# Patient Record
Sex: Female | Born: 1955 | Race: Black or African American | Hispanic: No | Marital: Single | Smoking: Never smoker
Health system: Southern US, Community
[De-identification: ages and names within clinical notes are randomized; demographics above are authoritative.]

## PROBLEM LIST (undated history)

## (undated) DIAGNOSIS — G8929 Other chronic pain: Secondary | ICD-10-CM

## (undated) DIAGNOSIS — M25569 Pain in unspecified knee: Secondary | ICD-10-CM

## (undated) DIAGNOSIS — M549 Dorsalgia, unspecified: Secondary | ICD-10-CM

## (undated) DIAGNOSIS — F209 Schizophrenia, unspecified: Secondary | ICD-10-CM

## (undated) DIAGNOSIS — K5792 Diverticulitis of intestine, part unspecified, without perforation or abscess without bleeding: Secondary | ICD-10-CM

## (undated) DIAGNOSIS — R109 Unspecified abdominal pain: Secondary | ICD-10-CM

## (undated) HISTORY — PX: KNEE SURGERY: SHX244

## (undated) HISTORY — PX: ABDOMINAL HYSTERECTOMY: SHX81

## (undated) HISTORY — DX: Unspecified abdominal pain: R10.9

## (undated) HISTORY — DX: Diverticulitis of intestine, part unspecified, without perforation or abscess without bleeding: K57.92

## (undated) HISTORY — PX: WRIST SURGERY: SHX841

## (undated) HISTORY — PX: CHOLECYSTECTOMY: SHX55

---

## 2015-09-22 ENCOUNTER — Encounter (HOSPITAL_COMMUNITY): Payer: Self-pay | Admitting: *Deleted

## 2015-09-22 ENCOUNTER — Emergency Department (HOSPITAL_COMMUNITY)
Admission: EM | Admit: 2015-09-22 | Discharge: 2015-09-23 | Disposition: A | Discharge status: Home / Self Care | Payer: Medicaid Other | Attending: Emergency Medicine | Admitting: Emergency Medicine

## 2015-09-22 DIAGNOSIS — R440 Auditory hallucinations: Secondary | ICD-10-CM | POA: Diagnosis present

## 2015-09-22 DIAGNOSIS — F209 Schizophrenia, unspecified: Secondary | ICD-10-CM | POA: Insufficient documentation

## 2015-09-22 LAB — CBC WITH DIFFERENTIAL/PLATELET
BASOS ABS: 0 10*3/uL (ref 0.0–0.1)
BASOS PCT: 0 %
Eosinophils Absolute: 0.2 10*3/uL (ref 0.0–0.7)
Eosinophils Relative: 2 %
HEMATOCRIT: 39.2 % (ref 36.0–46.0)
HEMOGLOBIN: 12.5 g/dL (ref 12.0–15.0)
LYMPHS PCT: 24 %
Lymphs Abs: 2.5 10*3/uL (ref 0.7–4.0)
MCH: 26.3 pg (ref 26.0–34.0)
MCHC: 31.9 g/dL (ref 30.0–36.0)
MCV: 82.4 fL (ref 78.0–100.0)
MONO ABS: 0.8 10*3/uL (ref 0.1–1.0)
MONOS PCT: 8 %
NEUTROS ABS: 6.9 10*3/uL (ref 1.7–7.7)
NEUTROS PCT: 66 %
Platelets: 303 10*3/uL (ref 150–400)
RBC: 4.76 MIL/uL (ref 3.87–5.11)
RDW: 13.9 % (ref 11.5–15.5)
WBC: 10.5 10*3/uL (ref 4.0–10.5)

## 2015-09-22 LAB — URINALYSIS, ROUTINE W REFLEX MICROSCOPIC
BILIRUBIN URINE: NEGATIVE
GLUCOSE, UA: NEGATIVE mg/dL
HGB URINE DIPSTICK: NEGATIVE
KETONES UR: NEGATIVE mg/dL
Nitrite: NEGATIVE
PH: 5.5 (ref 5.0–8.0)
PROTEIN: NEGATIVE mg/dL
Specific Gravity, Urine: 1.017 (ref 1.005–1.030)
Urobilinogen, UA: 0.2 mg/dL (ref 0.0–1.0)

## 2015-09-22 LAB — RAPID URINE DRUG SCREEN, HOSP PERFORMED
Amphetamines: NOT DETECTED
BARBITURATES: NOT DETECTED
BENZODIAZEPINES: NOT DETECTED
COCAINE: NOT DETECTED
Opiates: NOT DETECTED
TETRAHYDROCANNABINOL: NOT DETECTED

## 2015-09-22 LAB — URINE MICROSCOPIC-ADD ON

## 2015-09-22 LAB — COMPREHENSIVE METABOLIC PANEL
ALBUMIN: 3.7 g/dL (ref 3.5–5.0)
ALT: 14 U/L (ref 14–54)
ANION GAP: 9 (ref 5–15)
AST: 21 U/L (ref 15–41)
Alkaline Phosphatase: 80 U/L (ref 38–126)
BILIRUBIN TOTAL: 0.8 mg/dL (ref 0.3–1.2)
BUN: 6 mg/dL (ref 6–20)
CHLORIDE: 100 mmol/L — AB (ref 101–111)
CO2: 28 mmol/L (ref 22–32)
Calcium: 9.2 mg/dL (ref 8.9–10.3)
Creatinine, Ser: 0.83 mg/dL (ref 0.44–1.00)
GFR calc Af Amer: >60 mL/min (ref >60)
GFR calc non Af Amer: >60 mL/min (ref >60)
GLUCOSE: 92 mg/dL (ref 65–99)
Potassium: 3.3 mmol/L — ABNORMAL LOW (ref 3.5–5.1)
SODIUM: 137 mmol/L (ref 135–145)
TOTAL PROTEIN: 7 g/dL (ref 6.5–8.1)

## 2015-09-22 LAB — ETHANOL: Alcohol, Ethyl (B): <5 mg/dL (ref <5)

## 2015-09-22 MED ORDER — ZOLPIDEM TARTRATE 5 MG PO TABS: 5.0000 mg | ORAL_TABLET | Freq: Every evening | ORAL | Status: DC | PRN

## 2015-09-22 MED ORDER — IBUPROFEN 400 MG PO TABS: 600.0000 mg | ORAL_TABLET | Freq: Three times a day (TID) | ORAL | Status: DC | PRN

## 2015-09-22 MED ORDER — NICOTINE 21 MG/24HR TD PT24: 21.0000 mg | MEDICATED_PATCH | Freq: Every day | TRANSDERMAL | Status: DC

## 2015-09-22 MED ORDER — ACETAMINOPHEN 325 MG PO TABS: 650.0000 mg | ORAL_TABLET | ORAL | Status: DC | PRN

## 2015-09-22 MED ORDER — TRIHEXYPHENIDYL HCL 5 MG PO TABS
5.0000 mg | ORAL_TABLET | Freq: Two times a day (BID) | ORAL | Status: DC
  Administered 2015-09-23: 5 mg via ORAL
  Filled 2015-09-22 (×3): qty 1

## 2015-09-22 MED ORDER — TRIHEXYPHENIDYL HCL 5 MG PO TABS: 5.0000 mg | ORAL_TABLET | Freq: Two times a day (BID) | ORAL | Status: DC

## 2015-09-22 MED ORDER — ACETAMINOPHEN 325 MG PO TABS: 325.0000 mg | ORAL_TABLET | Freq: Once | ORAL | Status: DC

## 2015-09-22 MED ORDER — TRIHEXYPHENIDYL HCL 5 MG PO TABS
5.0000 mg | ORAL_TABLET | Freq: Once | ORAL | Status: AC
  Administered 2015-09-22: 5 mg via ORAL
  Filled 2015-09-22 (×2): qty 1

## 2015-09-22 MED ORDER — ONDANSETRON HCL 4 MG PO TABS: 4.0000 mg | ORAL_TABLET | Freq: Three times a day (TID) | ORAL | Status: DC | PRN

## 2015-09-22 MED ORDER — POTASSIUM CHLORIDE CRYS ER 20 MEQ PO TBCR
40.0000 meq | EXTENDED_RELEASE_TABLET | Freq: Once | ORAL | Status: AC
  Administered 2015-09-22: 40 meq via ORAL
  Filled 2015-09-22: qty 2

## 2015-09-22 MED ORDER — ALUM & MAG HYDROXIDE-SIMETH 200-200-20 MG/5ML PO SUSP: 30.0000 mL | ORAL | Status: DC | PRN

## 2015-09-22 NOTE — ED Notes (Signed)
PT is here with bilateral ankle and knee pain from walking a lot.  PT sates something is crawling around in her back

## 2015-09-22 NOTE — ED Notes (Signed)
Patient ambulated to rest room. Gait slow, steady.

## 2015-09-22 NOTE — ED Notes (Signed)
PT's SON is DEMETRIUS Heesch. [phone]

## 2015-09-22 NOTE — ED Provider Notes (Signed)
CSN: [id number]     Arrival date & time 09/22/15  1341 History  By signing my name below, I, Ronney LionSuzanne Le, attest that this documentation has been prepared under the direction and in the presence of [employer]ole Maryori Weide, PA-C. Electronically Signed: Ronney LionSuzanne Le, ED Scribe. 09/22/2015. 3:46 PM.    Chief Complaint  Patient presents with  . Hallucinations   The history is provided by the patient and a relative. No language interpreter was used.    HPI Comments: Felicia Collier is a 59 y.o. female who presents to the Emergency Department with the belief that "a snake is crawling around in [her] back." Patient further explains that there is a snake crawling around because she is "a mummy." She is also hearing voices telling her that "they have [her] children and don't want to give them back." However, patient also states she lives at home with her son. She states she is prescribed trazodone, but she has not been taking it because it makes her feel "drunk." She states she last had medication in the form of an injection at the hospital 2 weeks ago. Patient states she [facility]. She denies drug or EtOH consumption. She notes bilateral knee pain and is wearing knee sleeves.  Per telephone conversation with patient's son, patient has been hospitalized [facility] for psychiatric concerns over the past several weeks. He states he had picked her up and brought her to the ED today for knee pain. He states she lives alone.   History reviewed. No pertinent past medical history.   Past Surgical History  Procedure Laterality Date  . Cholecystectomy     No family history on file. Social History  Substance Use Topics  . Smoking status: Never Smoker   . Smokeless tobacco: None  . Alcohol Use: No   OB History    No data available     Review of Systems A complete 10 system review of systems was obtained and all systems are negative except as noted in the HPI  and PMH.    Allergies  Review of patient's allergies indicates not on file.  Home Medications   Prior to Admission medications   Not on File   BP 130/68 mmHg  Pulse 54  Temp(Src) 98.3 F (36.8 C) (Oral)  Resp 18  SpO2 98% Physical Exam  Constitutional: She is oriented to person, place, and time. She appears well-developed and well-nourished. No distress.  HENT:  Head: Normocephalic and atraumatic.  Eyes: Conjunctivae and EOM are normal.  Neck: Neck supple. No tracheal deviation present.  Cardiovascular: Normal rate.   Pulmonary/Chest: Effort normal. No respiratory distress.  Musculoskeletal: Normal range of motion.  Neurological: She is alert and oriented to person, place, and time.  Skin: Skin is warm and dry.  Psychiatric: She has a normal mood and affect. Her speech is tangential and slurred. She is actively hallucinating. She is not agitated and not aggressive. She expresses no homicidal and no suicidal ideation.  Patient appears clean and well cared for  Nursing note and vitals reviewed.   ED Course  Procedures (including critical care time)  DIAGNOSTIC STUDIES: Oxygen Saturation is 98% on RA, normal by my interpretation.    COORDINATION OF CARE: 2:28 PM - Discussed treatment plan with pt at bedside which includes pain medication and blood tests. Pt verbalized understanding and agreed to plan.   Labs Review Labs Reviewed - No data to display  Imaging Review No results found. I  have personally reviewed and evaluated these images and lab results as part of my medical decision-making.   EKG Interpretation None      MDM   Final diagnoses:  Schizophrenia, unspecified type (HCC)    Filed Vitals:   09/22/15 1400  BP: 130/68  Pulse: 54  Temp: 98.3 F (36.8 C)  TempSrc: Oral  Resp: 18  SpO2: 98%    Medications  acetaminophen (TYLENOL) tablet 325 mg (325 mg Oral Not Given 09/22/15 1817)  alum & mag hydroxide-simeth (MAALOX/MYLANTA) 200-200-20 MG/5ML  suspension 30 mL (not administered)  ondansetron (ZOFRAN) tablet 4 mg (not administered)  nicotine (NICODERM CQ - dosed in mg/24 hours) patch 21 mg (not administered)  ibuprofen (ADVIL,MOTRIN) tablet 600 mg (not administered)  acetaminophen (TYLENOL) tablet 650 mg (not administered)  zolpidem (AMBIEN) tablet 5 mg (not administered)  trihexyphenidyl (ARTANE) tablet 5 mg (not administered)    Followed by  trihexyphenidyl (ARTANE) tablet 5 mg (not administered)  potassium chloride SA (K-DUR,KLOR-CON) CR tablet 40 mEq (40 mEq Oral Given 09/22/15 1759)    Felicia Collier is 59 y.o. female presenting with hallucinations, patient is insistent that there is a snake in her back, that she is a mummy, that her children are being held Pharmacist, community. Patient is overtly psychotic with no past medical records in our system. She states [facility], states that she had a shot here week ago however there is no records of this. Patient appears well cared for and well kempt, however floridly disorganized thoughts.  I've called her son Rayfield Citizen states that he just began living with her several days ago. States that he went to pick her up from her apartment in [facility] because he had concerns that she was not caring for herself. As per son he states she has a history of bipolar schizophrenia and she was being managed by the [employer] team. States that he thinks she had multiple admissions to [facility] for psychiatric issues. He is unsure about her medical or psychiatric medications. He is unsure if patient has been taking any medications while living with him.  Discussed case with attending physician and considering our lack of history on this patient and her given age I have considered doing some neuro imaging but we have decided to hold off on that because she has a history of psychiatric issues. Will request medical records from [facility].   Very mild  hypokalemia, will replete orally. Patient is medically cleared for psychiatric evaluation will be transferred to the psych ED. TTS consulted, home meds and psych standard holding orders placed.   Medical records from [facility] show the patient has had multiple psychiatric admissions and has been involuntarily committed, she is noncompliant and it seems that her symptoms are consistent with prior psychotic episodes.  I personally performed the services described in this documentation, which was scribed in my presence. The recorded information has been reviewed and is accurate.     Wynetta Emery, PA-C 09/22/15 1822  Benjiman Core, MD 09/25/15 2249

## 2015-09-22 NOTE — Progress Notes (Signed)
Patient was referred for inpatient psych treatment at: [facility] - per intake, fax referral. [facility] - per French Anaracy, fax referral for the waitlist. [facility] - left voicemail [facility] - per Diannia RuderKara, fax referral, geriatric beds open. [facility] - per Delorise ShinerGrace, fax referral with EKG and chest xray. Results to be ordered.  [facility]  CSW will continue to seek placement.  Melbourne Abtsatia Maimouna Rondeau, LCSWA Disposition staff 09/22/2015 10:01 PM

## 2015-09-22 NOTE — ED Notes (Signed)
Patient awake in room, eating supper.

## 2015-09-22 NOTE — ED Notes (Signed)
During assessment Pt reports she has a snake crawling around on her back. Pt is cooperative and follows directions. Pt placed in wine scrubs .

## 2015-09-22 NOTE — BH Assessment (Signed)
Per Vernona RiegerLaura, NP - patient meets criteria for inpatient hospitalization.  CSW will [facility].

## 2015-09-22 NOTE — ED Notes (Signed)
TTS In progress.  

## 2015-09-22 NOTE — BH Assessment (Addendum)
Tele Assessment Note   Felicia Collier is a 59 y.o. African American female that reports at "a snake is crawling around in [her] back." Patient further explains that there is a snake crawling around because she is "a mummy." She is also hearing voices telling her that "they have [her] children and don't want to give them back." However, patient also states she lives at home with her son. She states she is prescribed trazodone, but she has not been taking it because it makes her feel "drunk." She states she last had medication in the form of an injection at the hospital 2 weeks ago.  Patient is a poor historian.   Writer received collateral information from her son.  Per her son the patient is diagnosed with Schizophrenia.  Patient has been living with him for a week because she is not able to live independently anymore.  Per her son the patient has been hospitalized several times.  Patient reports that she would always get hospitalized when he was a child growing up.  Patient receives psychiatric care at [facility].   Her son reports that he initially brought his mother to the ED due to bilateral knee pain and is wearing knee sleeves.  Her son reports that her last hospitalization was at [facility].    Diagnosis: Schizophrenia  Past Medical History: History reviewed. No pertinent past medical history.  Past Surgical History  Procedure Laterality Date  . Cholecystectomy      Family History: No family history on file.  Social History:  reports that she has never smoked. She does not have any smokeless tobacco history on file. She reports that she does not drink alcohol or use illicit drugs.  Additional Social History:  Alcohol / Drug Use History of alcohol / drug use?: No history of alcohol / drug abuse  CIWA: CIWA-Ar BP: 130/68 mmHg Pulse Rate: (!) 54 COWS:    PATIENT STRENGTHS: (choose at least two) Average or above average intelligence Supportive family/friends  Allergies:  No Known Allergies  Home Medications:  (Not in a hospital admission)  OB/GYN Status:  No LMP recorded.  General Assessment Data Location of Assessment: [facility] TTS Assessment: In system Is this a Tele or Face-to-Face Assessment?: Tele Assessment Is this an Initial Assessment or a Re-assessment for this encounter?: Initial Assessment Marital status: Single Maiden name: NA Is patient pregnant?: No Pregnancy Status: No Living Arrangements: Other (Comment) (Lives with her son for one week. ) Can pt return to current living arrangement?: Yes Admission Status: Voluntary Is patient capable of signing voluntary admission?: Yes Referral Source: Self/Family/Friend Insurance type: Medicaid  Medical Screening Exam [facility] Walk-in ONLY) Medical Exam completed: Yes  Crisis Care Plan Living Arrangements: Other (Comment) (Lives with her son for one week. ) Name of Psychiatrist: Patient was not able to answer the question.  Name of Therapist: Patienr was not abled to answer the question   Education Status Is patient currently in school?: No Current Grade: NA Highest grade of school patient has completed: NA Name of school: NA Contact person: NA  Risk to self with the past 6 months Suicidal Ideation: No Has patient been a risk to self within the past 6 months prior to admission? : No Suicidal Intent: No Has patient had any suicidal intent within the past 6 months prior to admission? : No Is patient at risk for suicide?: No Suicidal Plan?: No Has patient had any suicidal plan within the past 6 months prior to admission? :  No Access to Means: No What has been your use of drugs/alcohol within the last 12 months?: NA Previous Attempts/Gestures: No How many times?: 0 Other Self Harm Risks: NA Triggers for Past Attempts: None known Intentional Self Injurious Behavior: None Family Suicide History: No Recent stressful life event(s): Other (Comment) (Was living independently now livnig with her  son ) Persecutory voices/beliefs?: Yes Depression: Yes Depression Symptoms: Despondent, Guilt, Feeling worthless/self pity Substance abuse history and/or treatment for substance abuse?: No Suicide prevention information given to non-admitted patients: Not applicable  Risk to Others within the past 6 months Homicidal Ideation: No Does patient have any lifetime risk of violence toward others beyond the six months prior to admission? : No Thoughts of Harm to Others: No Current Homicidal Intent: No Current Homicidal Plan: No Access to Homicidal Means: No Identified Victim: NA History of harm to others?: No Assessment of Violence: None Noted Violent Behavior Description: na Does patient have access to weapons?: No Criminal Charges Pending?: No Does patient have a court date: No Is patient on probation?: No  Psychosis Hallucinations: None noted Delusions: None noted  Mental Status Report Appearance/Hygiene: Disheveled Eye Contact: Poor Motor Activity: Freedom of movement Speech: Tangential Level of Consciousness: Alert Mood: Suspicious Affect: Blunted, Depressed Anxiety Level: None Thought Processes: Flight of Ideas Judgement: Unable to Assess Orientation: Not oriented Obsessive Compulsive Thoughts/Behaviors: None  Cognitive Functioning Concentration: Decreased Memory: Unable to Assess IQ: Average Insight: Unable to Assess Impulse Control: Unable to Assess Appetite: Fair Weight Loss: 0 Weight Gain: 0 Sleep: Unable to Assess Total Hours of Sleep:  (UTA) Vegetative Symptoms: Unable to Assess  ADLScreening [facility] Assessment Services) Patient's cognitive ability adequate to safely complete daily activities?: Yes Patient able to express need for assistance with ADLs?: No Independently performs ADLs?: Yes (appropriate for developmental age)  Prior Inpatient Therapy Prior Inpatient Therapy: Yes Prior Therapy Dates: 2016 [facility] Reason for Treatment: Psychosis  Prior Outpatient Therapy Prior Outpatient Therapy: Yes Prior Therapy Dates: Ongoing  Prior Therapy Facilty/Provider(s): Son does not know the name  Reason for Treatment: Medication Managemetn  Does patient have an ACCT team?: No Does patient have Intensive In-House Services?  : No Does patient have [facility] services? : No Does patient have P4CC services?: No  ADL Screening (condition at time of admission) Patient's cognitive ability adequate to safely complete daily activities?: Yes Is the patient deaf or have difficulty hearing?: No Does the patient have difficulty seeing, even when wearing glasses/contacts?: No Does the patient have difficulty concentrating, remembering, or making decisions?: Yes Patient able to express need for assistance with ADLs?: No Does the patient have difficulty dressing or bathing?: Yes Independently performs ADLs?: Yes (appropriate for developmental age) Does the patient have difficulty walking or climbing stairs?: No Weakness of Legs: None Weakness of Arms/Hands: None  Home Assistive Devices/Equipment Home Assistive Devices/Equipment: None    Abuse/Neglect Assessment (Assessment to be complete while patient is alone) Physical Abuse: Denies Verbal Abuse: Denies Sexual Abuse: Denies Exploitation of patient/patient's resources: Denies Self-Neglect: Denies Values / Beliefs Cultural Requests During Hospitalization: None Spiritual Requests During Hospitalization: None Consults Spiritual Care Consult Needed: No Social Work Consult Needed: No Merchant navy officerAdvance Directives (For Healthcare) Does patient have an advance directive?: No Would patient like information on creating an advanced directive?: No - patient declined information Type of Advance Directive: Living will Copy of advanced directive(s) in chart?: No - copy requested    Additional Information 1:1 In Past 12 Months?: No CIRT Risk:  No Elopement Risk:  No Does patient have medical clearance?: Yes     Disposition: Per Vernona Rieger, NP - patient meets criteria for inpatient hospitalization.  CSW will seek placement.  Disposition Initial Assessment Completed for this Encounter: Yes Disposition of Patient: Other dispositions  Linton Rump 09/22/2015 5:47 PM

## 2015-09-22 NOTE — ED Notes (Signed)
PT placed in wine scrubs and PT has been wanded

## 2015-09-23 ENCOUNTER — Emergency Department (HOSPITAL_COMMUNITY): Payer: Medicaid Other

## 2015-09-23 NOTE — ED Notes (Signed)
This RN spoke with representative of [facility], advised pt did receive replacement potassium (40 mEq given yesterday) and that pt is stable with clean chest xray and EKG.  [facility] will present case to MD and let us know.

## 2015-09-23 NOTE — ED Notes (Signed)
[employer] notified services needed.

## 2015-09-23 NOTE — Progress Notes (Signed)
French Anaracy with [facility] called to state referral is being reviewed and she had several medical questions re: pt's activity level in ED and potassium levels, etc. Directed her to call [facility] and follow up with CSW if further assistance is needed.  Advised she will be having admitting MD review referral and will call back with result. Pt not accepted at this time.  Ilean SkillMeghan Kajuan Guyton, MSW, LCSW Clinical Social Work, Disposition  09/23/2015 [phone]

## 2015-09-23 NOTE — Progress Notes (Addendum)
Followed up on inpatient psych placement efforts. Also considered for [facility] admission upon bed availability  [facility]- per Nicholos JohnsKathleen, cannot locate referral faxed last night. Ref-faxed with chest x-ray and EKG results [facility]- per Thayer Ohmhris possibility of female geriatric beds- referral made [facility]- per Devra Doppayla, sent referral [facility]- bed status unknown for today but advised send referral for review if beds open [place]- Per Albin Fellingarla, sent referral  Declined: [facility]  Both due to the facilities being unable to accept adult MCD as coverage source.  Ilean SkillMeghan Eddith Mentor, MSW, LCSW Clinical Social Work, Disposition  09/23/2015 [phone]

## 2015-09-23 NOTE — ED Notes (Signed)
Patent was given a snack and drink. Regular diet order taken for dinner.

## 2015-10-04 ENCOUNTER — Emergency Department (INDEPENDENT_AMBULATORY_CARE_PROVIDER_SITE_OTHER)
Admission: EM | Admit: 2015-10-04 | Discharge: 2015-10-04 | Disposition: A | Discharge status: Home / Self Care | Payer: Medicaid Other | Source: Home / Self Care | Attending: Emergency Medicine | Admitting: Emergency Medicine

## 2015-10-04 ENCOUNTER — Encounter (HOSPITAL_COMMUNITY): Payer: Self-pay | Admitting: Emergency Medicine

## 2015-10-04 DIAGNOSIS — W57XXXA Bitten or stung by nonvenomous insect and other nonvenomous arthropods, initial encounter: Secondary | ICD-10-CM

## 2015-10-04 DIAGNOSIS — S80862A Insect bite (nonvenomous), left lower leg, initial encounter: Secondary | ICD-10-CM | POA: Diagnosis not present

## 2015-10-04 MED ORDER — CEPHALEXIN 500 MG PO CAPS: 500.0000 mg | ORAL_CAPSULE | Freq: Three times a day (TID) | ORAL | Status: DC

## 2015-10-04 NOTE — ED Notes (Signed)
[facility] with a complaint of a bug bite to her left lower leg that occurred about 1 week ago.

## 2015-10-04 NOTE — Discharge Instructions (Signed)
It looks like you are having a local reaction to a bug bite. You can continue the Vaseline and histamine cream to help with the itching. I marked the redness today. If the redness is spreading, please start the antibiotics. Follow-up as needed.

## 2015-10-04 NOTE — ED Provider Notes (Signed)
CSN: [id number]     Arrival date & time 10/04/15  1324 History   First MD Initiated Contact with Patient 10/04/15 1436     Chief Complaint  Patient presents with  . Insect Bite   (Consider location/radiation/quality/duration/timing/severity/associated sxs/prior Treatment) HPI  She is a 59 year old woman here for evaluation of insect bite. She states about one week ago she felt something bite her left medial ankle. She did not see what bit her. She reports some redness and swelling to the site. She also states it itches. She's been using Vaseline cream and an antihistamine cream with good control of the itching. Fevers or chills. She states she thinks it is getting better.  History reviewed. No pertinent past medical history. Past Surgical History  Procedure Laterality Date  . Cholecystectomy     History reviewed. No pertinent family history. Social History  Substance Use Topics  . Smoking status: Never Smoker   . Smokeless tobacco: None  . Alcohol Use: No   OB History    No data available     Review of Systems As in history of present illness Allergies  Review of patient's allergies indicates no known allergies.  Home Medications   Prior to Admission medications   Medication Sig Start Date End Date Taking? Authorizing Provider  cephALEXin (KEFLEX) 500 MG capsule Take 1 capsule (500 mg total) by mouth 3 (three) times daily. 10/04/15   Charm RingsErin J Ranie Chinchilla, MD  trihexyphenidyl (ARTANE) 5 MG tablet Take 5 mg by mouth 2 (two) times daily with a meal.    Historical Provider, MD   Meds Ordered and Administered this Visit  Medications - No data to display  BP 153/68 mmHg  Pulse 71  Temp(Src) 98.1 F (36.7 C) (Oral)  Resp 16  SpO2 97% No data found.   Physical Exam  Constitutional: She is oriented to person, place, and time. She appears well-developed and well-nourished. No distress.  Cardiovascular: Normal rate.   Pulmonary/Chest: Effort normal.  Neurological: She is alert  and oriented to person, place, and time.  Skin:  She has a 3 cm area of erythema and mild swelling on the left medial ankle. There is a central punctum. No fluctuance.    ED Course  Procedures (including critical care time)  Labs Review Labs Reviewed - No data to display  Imaging Review No results found.    MDM   1. Insect bite of leg, left, initial encounter    No clear sign of infection. This is likely a local reaction to whatever bit her. Continue Vaseline and antihistamine cream to control itching. I marked the area of erythema. If this is spreading, she will fill the prescription for Keflex. Her son was present for the visit. Follow-up as needed.    Charm RingsErin J Kush Farabee, MD 10/04/15 [phone]

## 2015-10-20 ENCOUNTER — Emergency Department (HOSPITAL_COMMUNITY)
Admission: EM | Admit: 2015-10-20 | Discharge: 2015-10-20 | Disposition: A | Discharge status: Home / Self Care | Payer: Medicaid Other | Source: Home / Self Care | Attending: Family Medicine | Admitting: Family Medicine

## 2015-10-20 ENCOUNTER — Encounter (HOSPITAL_COMMUNITY): Payer: Self-pay | Admitting: Emergency Medicine

## 2015-10-20 DIAGNOSIS — M6580 Other synovitis and tenosynovitis, unspecified site: Secondary | ICD-10-CM | POA: Diagnosis not present

## 2015-10-20 DIAGNOSIS — M76899 Other specified enthesopathies of unspecified lower limb, excluding foot: Secondary | ICD-10-CM

## 2015-10-20 DIAGNOSIS — M6588 Other synovitis and tenosynovitis, other site: Secondary | ICD-10-CM | POA: Diagnosis not present

## 2015-10-20 DIAGNOSIS — L84 Corns and callosities: Secondary | ICD-10-CM

## 2015-10-20 DIAGNOSIS — M7750 Other enthesopathy of unspecified foot: Secondary | ICD-10-CM

## 2015-10-20 MED ORDER — DICLOFENAC SODIUM 1 % TD GEL: 1.0000 "application " | Freq: Four times a day (QID) | TRANSDERMAL | Status: DC

## 2015-10-20 NOTE — ED Provider Notes (Signed)
CSN: [id number]     Arrival date & time 10/20/15  1311 History   First MD Initiated Contact with Patient 10/20/15 1519     Chief Complaint  Patient presents with  . Knee Pain  . Ankle Pain   (Consider location/radiation/quality/duration/timing/severity/associated sxs/prior Treatment) HPI Comments: 59 year old female appearing older than stated age and is complaining of pain in both feet and knees for greater than 4 months. The pain in her left knee and left foot is greatest. 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 The walking is creating more discomfort. The pain is located primarily to the medial aspect of both knees. The pain in the left foot is localized to the plantar aspect to the distal forefoot at the "ball" of the foot beneath the second toe. No history of trauma. She states that she has moved here recently and does not have a PCP. For usual source of for primary care are urgent cares.  Patient is a 59 y.o. female presenting with knee pain and ankle pain.  Knee Pain Associated symptoms: no fatigue, no fever and no neck pain   Ankle Pain Associated symptoms: no fatigue, no fever and no neck pain     History reviewed. No pertinent past medical history. Past Surgical History  Procedure Laterality Date  . Cholecystectomy     No family history on file. Social History  Substance Use Topics  . Smoking status: Never Smoker   . Smokeless tobacco: None  . Alcohol Use: No   OB History    No data available     Review of Systems  Constitutional: Positive for activity change. Negative for fever and fatigue.  Respiratory: Negative.   Gastrointestinal: Negative.   Musculoskeletal: Positive for arthralgias. Negative for joint swelling and neck pain.  Skin: Negative.  Negative for color change and wound.  Neurological: Negative.     Allergies  Review of patient's allergies indicates no known  allergies.  Home Medications   Prior to Admission medications   Medication Sig Start Date End Date Taking? Authorizing Provider  diclofenac sodium (VOLTAREN) 1 % GEL Apply 1 application topically 4 (four) times daily. 10/20/15   Hayden Rasmussen, NP  trihexyphenidyl (ARTANE) 5 MG tablet Take 5 mg by mouth 2 (two) times daily with a meal.    Historical Provider, MD   Meds Ordered and Administered this Visit  Medications - No data to display  BP 121/85 mmHg  Pulse 67  Temp(Src) 98.2 F (36.8 C) (Oral)  Resp 16  SpO2 99% No data found.   Physical Exam  Constitutional: She is oriented to person, place, and time. She appears well-developed and well-nourished. No distress.  Eyes: EOM are normal.  Neck: Normal range of motion. Neck supple.  Cardiovascular: Normal rate.   Pulmonary/Chest: Effort normal. No respiratory distress.  Musculoskeletal: She exhibits no edema.  There is minor tenderness to the bilateral ankles. Specifically to the dorsomedial aspect. No swelling. No bony tenderness. Full range of motion of the ankles. No external lesions or deformities. Tenderness to the dorsomedial tendons. Bilateral knees without apparent edema. Extension and flexion intact. No edema to the joint spaces.  areas of tenderness to the medial aspect of the bilateral knees. Chest medial to the patella. There are cordlike structures that when palpated reproduce the pain for which she presents.   Neurological: She is alert and oriented to person, place, and  time. She exhibits normal muscle tone.  Skin: Skin is warm and dry.  Psychiatric: She has a normal mood and affect.  Nursing note and vitals reviewed.   ED Course  Procedures (including critical care time)  Labs Review Labs Reviewed - No data to display  Imaging Review No results found.   Visual Acuity Review  Right Eye Distance:   Left Eye Distance:   Bilateral Distance:    Right Eye Near:   Left Eye Near:    Bilateral Near:          MDM   1. Tendinitis of knee   2. Tendinitis of ankle or foot   3. Callus of foot    59 year old female is increased her walking in terms of distance and frequency over the past several months. She has gradually developed pain in the knees, ankles and feet. Likely has some tendinitis, possibly arthritis as well. Recommend ice over the areas of swelling. Diclofenac gel 4 times a day to sore areas follow-up podiatrist and obtained a PCP as soon as possible. Limit the amount of walking or doing as much as possible.    Hayden Rasmussenavid Kacey Vicuna, NP 10/20/15 [phone]

## 2015-10-20 NOTE — ED Notes (Signed)
Left knee and left foot pain.  No known injury.  Reports pain for 3 months.

## 2015-11-08 ENCOUNTER — Emergency Department (HOSPITAL_COMMUNITY)
Admission: EM | Admit: 2015-11-08 | Discharge: 2015-11-08 | Disposition: A | Discharge status: Home / Self Care | Payer: Medicaid Other | Attending: Emergency Medicine | Admitting: Emergency Medicine

## 2015-11-08 ENCOUNTER — Encounter (HOSPITAL_COMMUNITY): Payer: Self-pay | Admitting: *Deleted

## 2015-11-08 DIAGNOSIS — M25562 Pain in left knee: Secondary | ICD-10-CM | POA: Insufficient documentation

## 2015-11-08 DIAGNOSIS — Z79899 Other long term (current) drug therapy: Secondary | ICD-10-CM | POA: Insufficient documentation

## 2015-11-08 DIAGNOSIS — G8929 Other chronic pain: Secondary | ICD-10-CM | POA: Insufficient documentation

## 2015-11-08 MED ORDER — IBUPROFEN 400 MG PO TABS
600.0000 mg | ORAL_TABLET | Freq: Once | ORAL | Status: AC
  Administered 2015-11-08: 600 mg via ORAL
  Filled 2015-11-08: qty 1

## 2015-11-08 MED ORDER — TRAMADOL HCL 50 MG PO TABS: 50.0000 mg | ORAL_TABLET | Freq: Two times a day (BID) | ORAL | Status: DC | PRN

## 2015-11-08 MED ORDER — TRAMADOL HCL 50 MG PO TABS
50.0000 mg | ORAL_TABLET | Freq: Once | ORAL | Status: AC
Start: 2015-11-08 — End: 2015-11-08
  Administered 2015-11-08: 50 mg via ORAL
  Filled 2015-11-08: qty 1

## 2015-11-08 NOTE — ED Notes (Signed)
Pt departed in NAD.  

## 2015-11-08 NOTE — ED Notes (Signed)
The pts lt knee has been painful for 2 days.  No  Known injury.  She was seen here and given med that is not helping

## 2015-11-08 NOTE — Discharge Instructions (Signed)
Knee Pain Ms. Felicia Collier, See a primary care physician within one week for close follow-up. Take ibuprofen as needed for your pain. If your pain becomes severe take tramadol. For any worsening symptoms come back to emergency department immediately. Thank you. Knee pain is a common problem. It can have many causes. The pain often goes away by following your doctor's home care instructions. Treatment for ongoing pain will depend on the cause of your pain. If your knee pain continues, more tests may be needed to diagnose your condition. Tests may include X-rays or other imaging studies of your knee. HOME CARE  Take medicines only as told by your doctor.  Rest your knee and keep it raised (elevated) while you are resting.  Do not do things that cause pain or make your pain worse.  Avoid activities where both feet leave the ground at the same time, such as running, jumping rope, or doing jumping jacks.  Apply ice to the knee area:  Put ice in a plastic bag.  Place a towel between your skin and the bag.  Leave the ice on for 20 minutes, 2-3 times a day.  Ask your doctor if you should wear an elastic knee support.  Sleep with a pillow under your knee.  Lose weight if you are overweight. Being overweight can make your knee hurt more.  Do not use any tobacco products, including cigarettes, chewing tobacco, or electronic cigarettes. If you need help quitting, ask your doctor. Smoking may slow the healing of any bone and joint problems that you may have. GET HELP IF:  Your knee pain does not stop, it changes, or it gets worse.  You have a fever along with knee pain.  Your knee gives out or locks up.  Your knee becomes more swollen. GET HELP RIGHT AWAY IF:   Your knee feels hot to the touch.  You have chest pain or trouble breathing.   This information is not intended to replace advice given to you by your health care provider. Make sure you discuss any questions you have with your health  care provider.   Document Released: 01/28/2009 Document Revised: 11/22/2014 Document Reviewed: 01/02/2014 Elsevier Interactive Patient Education [employer]2016 Elsevier Inc.

## 2015-11-08 NOTE — ED Provider Notes (Signed)
CSN: [id number]     Arrival date & time 11/08/15  0144 History  By signing my name below, I, Freida Busman, attest that this documentation has been prepared under the direction and in the presence of Tomasita Crumble, MD . Electronically Signed: Freida Busman, Scribe. 11/08/2015. 2:27 AM.     Chief Complaint  Patient presents with  . Knee Pain     The history is provided by the patient and medical records. No language interpreter was used.    HPI Comments:  Felicia Collier is a 59 y.o. female who presents to the Emergency Department complaining of bilateral knee pain, intermittent x a few months. Pt was at Urgent Care  for same on 10/20/15. She states she was discharged with temazepam (chart review shows diclofenac)  which she finished on the 19th; states it provided no relief. Her pain is exacerbated when ambulating. She denies recent fall/injury.  No alleviating factors noted. Pt has no other complaints or symptoms at this time.   History reviewed. No pertinent past medical history. Past Surgical History  Procedure Laterality Date  . Cholecystectomy     No family history on file. Social History  Substance Use Topics  . Smoking status: Never Smoker   . Smokeless tobacco: None  . Alcohol Use: No   OB History    No data available     Review of Systems  10 systems reviewed and all are negative for acute change except as noted in the HPI.  Allergies  Review of patient's allergies indicates no known allergies.  Home Medications   Prior to Admission medications   Medication Sig Start Date End Date Taking? Authorizing Provider  diclofenac sodium (VOLTAREN) 1 % GEL Apply 1 application topically 4 (four) times daily. 10/20/15   Hayden Rasmussen, NP  trihexyphenidyl (ARTANE) 5 MG tablet Take 5 mg by mouth 2 (two) times daily with a meal.    Historical Provider, MD   BP 123/66 mmHg  Pulse 64  Temp(Src) 97.9 F (36.6 C) (Oral)  Resp 16  Ht  (1.626 m)  Wt 154 lb 7 oz (70.052 kg)  BMI  26.50 kg/m2  SpO2 99% Physical Exam  Constitutional: She is oriented to person, place, and time. She appears well-developed and well-nourished. No distress.  HENT:  Head: Normocephalic and atraumatic.  Nose: Nose normal.  Mouth/Throat: Oropharynx is clear and moist. No oropharyngeal exudate.  Eyes: Conjunctivae and EOM are normal. Pupils are equal, round, and reactive to light. No scleral icterus.  Neck: Normal range of motion. Neck supple. No JVD present. No tracheal deviation present. No thyromegaly present.  Cardiovascular: Normal rate, regular rhythm and normal heart sounds.  Exam reveals no gallop and no friction rub.   No murmur heard. Pulmonary/Chest: Effort normal and breath sounds normal. No respiratory distress. She has no wheezes. She exhibits no tenderness.  Abdominal: Soft. Bowel sounds are normal. She exhibits no distension and no mass. There is no tenderness. There is no rebound and no guarding.  Musculoskeletal: Normal range of motion. She exhibits no edema or tenderness.  FROM bilateral knees Stable joints  No warmth Left knee effusion present Right knee- no effusion   Lymphadenopathy:    She has no cervical adenopathy.  Neurological: She is alert and oriented to person, place, and time. No cranial nerve deficit. She exhibits normal muscle tone.  Skin: Skin is warm and dry. No rash noted. No erythema. No pallor.  Nursing note reviewed.   ED Course  Procedures  DIAGNOSTIC STUDIES:  Oxygen Saturation is 97% on RA, normal by my interpretation.    COORDINATION OF CARE:  2:07 AM Discussed treatment plan with pt at bedside and pt agreed to plan.    MDM   Final diagnoses:  Chronic knee pain, left    Patient presents emergency department for chronic knee pain. Per chart review, this is been going on for several months.  She is advised to take ibuprofen as needed for pain control. tRAMADOL was given as prescription for breakthrough pain. The knee is not warm or  tender, I doubt septic joint. She appears well in no acute distress, vital signs were within her normal limits and she is safe for discharge.  I personally performed the services described in this documentation, which was scribed in my presence. The recorded information has been reviewed and is accurate.      Tomasita CrumbleAdeleke Beauden Tremont, MD 11/08/15 [phone]

## 2015-11-12 ENCOUNTER — Encounter (HOSPITAL_COMMUNITY): Payer: Self-pay

## 2015-11-12 ENCOUNTER — Emergency Department (INDEPENDENT_AMBULATORY_CARE_PROVIDER_SITE_OTHER): Payer: Medicaid Other

## 2015-11-12 ENCOUNTER — Emergency Department (INDEPENDENT_AMBULATORY_CARE_PROVIDER_SITE_OTHER)
Admission: EM | Admit: 2015-11-12 | Discharge: 2015-11-12 | Disposition: A | Discharge status: Home / Self Care | Payer: Medicaid Other | Source: Home / Self Care

## 2015-11-12 DIAGNOSIS — M79604 Pain in right leg: Secondary | ICD-10-CM

## 2015-11-12 DIAGNOSIS — M79605 Pain in left leg: Secondary | ICD-10-CM | POA: Diagnosis not present

## 2015-11-12 MED ORDER — NAPROXEN 500 MG PO TABS: 500.0000 mg | ORAL_TABLET | Freq: Two times a day (BID) | ORAL | Status: DC

## 2015-11-12 NOTE — ED Notes (Signed)
C/o pain both feet, knees x couple days; denies injury

## 2015-11-12 NOTE — ED Notes (Signed)
Went to get patient for knee exam, had to go to the rest room and then change into a gown

## 2015-11-12 NOTE — ED Provider Notes (Signed)
CSN: [id number]     Arrival date & time 11/12/15  1308 History   None    Chief Complaint  Patient presents with  . Knee Pain  . Foot Pain   (Consider location/radiation/quality/duration/timing/severity/associated sxs/prior Treatment) HPI History obtained from patient: Pt presents with bilateral leg pain, 3 rd visit this month. 2 UC 1 ER. Symptomatic treatment but patient denies any relief.  States she has found impossible to find a PCP as no one is taking new medicaid. [place], [place] now [place] in [place]. Pain is not new, same pain for the last 2 months. Wakes her from sleep.  No known injury.  History reviewed. No pertinent past medical history. Past Surgical History  Procedure Laterality Date  . Cholecystectomy     History reviewed. No pertinent family history. Social History  Substance Use Topics  . Smoking status: Never Smoker   . Smokeless tobacco: None  . Alcohol Use: No   OB History    No data available     Review of Systems ROS +'ve chronic bilateral leg pai  Denies: HEADACHE, NAUSEA, ABDOMINAL PAIN, CHEST PAIN, CONGESTION, DYSURIA, SHORTNESS OF BREATH  Allergies  Review of patient's allergies indicates no known allergies.  Home Medications   Prior to Admission medications   Medication Sig Start Date End Date Taking? Authorizing Provider  diclofenac sodium (VOLTAREN) 1 % GEL Apply 1 application topically 4 (four) times daily. 10/20/15   Hayden Rasmussenavid Mabe, NP  traMADol (ULTRAM) 50 MG tablet Take 1 tablet (50 mg total) by mouth every 12 (twelve) hours as needed for severe pain. 11/08/15   Tomasita CrumbleAdeleke Oni, MD  trihexyphenidyl (ARTANE) 5 MG tablet Take 5 mg by mouth 2 (two) times daily with a meal.    Historical Provider, MD   Meds Ordered and Administered this Visit  Medications - No data to display  BP 145/77 mmHg  Pulse 60  Temp(Src) 100.8 F (38.2 C) (Oral) No data found.   Physical Exam  Constitutional: She is oriented to  person, place, and time. She appears well-developed and well-nourished. No distress.  HENT:  Head: Normocephalic and atraumatic.  Musculoskeletal: Normal range of motion. She exhibits no tenderness.  Neurological: She is alert and oriented to person, place, and time.  Skin: Skin is warm and dry.  Psychiatric: She has a normal mood and affect. Her behavior is normal. Judgment and thought content normal.  Nursing note and vitals reviewed.   ED Course  Procedures (including critical care time)  Labs Review Labs Reviewed - No data to display  Imaging Review No results found.   Visual Acuity Review  Right Eye Distance:   Left Eye Distance:   Bilateral Distance:    Right Eye Near:   Left Eye Near:    Bilateral Near:         MDM   1. Leg pain, bilateral       Review with patient xray of left knee Patient should give a call to practices taking new medicaid patients. (Dr Larita Fifehan [place]ger) Rx naprosyn: application of heat.  Diclofenac gel for topical application.  Follow up as needed.     Tharon AquasFrank C Wassim Kirksey, PA 11/12/15 1914

## 2015-11-12 NOTE — Discharge Instructions (Signed)
Heat Therapy °Heat therapy can help ease sore, stiff, injured, and tight muscles and joints. Heat relaxes your muscles, which may help ease your pain. Heat therapy should only be used on old, pre-existing, or long-lasting (chronic) injuries. Do not use heat therapy unless told by your doctor. °HOW TO USE HEAT THERAPY °There are several different kinds of heat therapy, including: °· Moist heat pack. °· Warm water bath. °· Hot water bottle. °· Electric heating pad. °· Heated gel pack. °· Heated wrap. °· Electric heating pad. °GENERAL HEAT THERAPY RECOMMENDATIONS  °· Do not sleep while using heat therapy. Only use heat therapy while you are awake. °· Your skin may turn pink while using heat therapy. Do not use heat therapy if your skin turns red. °· Do not use heat therapy if you have new pain. °· High heat or long exposure to heat can cause burns. Be careful when using heat therapy to avoid burning your skin. °· Do not use heat therapy on areas of your skin that are already irritated, such as with a rash or sunburn. °GET HELP IF:  °· You have blisters, redness, swelling (puffiness), or numbness. °· You have new pain. °· Your pain is worse. °MAKE SURE YOU: °· Understand these instructions. °· Will watch your condition. °· Will get help right away if you are not doing well or get worse. °  °This information is not intended to replace advice given to you by your health care provider. Make sure you discuss any questions you have with your health care provider. °  °Document Released: 01/24/2012 Document Revised: 11/22/2014 Document Reviewed: 12/25/2013 °Elsevier Interactive Patient Education ©2016 Elsevier Inc. ° °

## 2015-11-18 ENCOUNTER — Emergency Department (HOSPITAL_COMMUNITY)
Admission: EM | Admit: 2015-11-18 | Discharge: 2015-11-18 | Disposition: A | Discharge status: Home / Self Care | Payer: Medicaid Other | Source: Home / Self Care

## 2015-11-18 ENCOUNTER — Encounter (HOSPITAL_COMMUNITY): Payer: Self-pay | Admitting: *Deleted

## 2015-11-18 DIAGNOSIS — M25562 Pain in left knee: Secondary | ICD-10-CM | POA: Diagnosis not present

## 2015-11-18 DIAGNOSIS — M25462 Effusion, left knee: Secondary | ICD-10-CM

## 2015-11-18 MED ORDER — TRAMADOL HCL 50 MG PO TABS: 50.0000 mg | ORAL_TABLET | Freq: Four times a day (QID) | ORAL | Status: DC | PRN

## 2015-11-18 NOTE — ED Notes (Signed)
Pt  Reports  l  Knee    Pain   Pt states   Was   Seen  Last  Week  For   Similar  Symptoms      Pt  denys  Any  specefic  Injury          She  Reports     Pain       -      Pt       Sitting     Upright   On  Exam  Table  Speaking in  Complete  sentances

## 2015-11-18 NOTE — Discharge Instructions (Signed)
Heat Therapy °Heat therapy can help ease sore, stiff, injured, and tight muscles and joints. Heat relaxes your muscles, which may help ease your pain. Heat therapy should only be used on old, pre-existing, or long-lasting (chronic) injuries. Do not use heat therapy unless told by your doctor. °HOW TO USE HEAT THERAPY °There are several different kinds of heat therapy, including: °· Moist heat pack. °· Warm water bath. °· Hot water bottle. °· Electric heating pad. °· Heated gel pack. °· Heated wrap. °· Electric heating pad. °GENERAL HEAT THERAPY RECOMMENDATIONS  °· Do not sleep while using heat therapy. Only use heat therapy while you are awake. °· Your skin may turn pink while using heat therapy. Do not use heat therapy if your skin turns red. °· Do not use heat therapy if you have new pain. °· High heat or long exposure to heat can cause burns. Be careful when using heat therapy to avoid burning your skin. °· Do not use heat therapy on areas of your skin that are already irritated, such as with a rash or sunburn. °GET HELP IF:  °· You have blisters, redness, swelling (puffiness), or numbness. °· You have new pain. °· Your pain is worse. °MAKE SURE YOU: °· Understand these instructions. °· Will watch your condition. °· Will get help right away if you are not doing well or get worse. °  °This information is not intended to replace advice given to you by your health care provider. Make sure you discuss any questions you have with your health care provider. °  °Document Released: 01/24/2012 Document Revised: 11/22/2014 Document Reviewed: 12/25/2013 °Elsevier Interactive Patient Education ©2016 Elsevier Inc. ° °

## 2015-11-18 NOTE — ED Provider Notes (Signed)
CSN: [id number]     Arrival date & time 11/18/15  1300 History   None    Chief Complaint  Patient presents with  . Knee Pain   (Consider location/radiation/quality/duration/timing/severity/associated sxs/prior Treatment) The history is provided by the patient. No language interpreter was used.   60 y/o female with continued knee pain. This is the 5th visit for this complaint. States she has  No relief. Was given follow up physician that she has not contacted at this time.  History reviewed. No pertinent past medical history. Past Surgical History  Procedure Laterality Date  . Cholecystectomy     History reviewed. No pertinent family history. Social History  Substance Use Topics  . Smoking status: Never Smoker   . Smokeless tobacco: None  . Alcohol Use: No   OB History    No data available     Review of Systems  Constitutional: Positive for activity change.  HENT: Negative.   Respiratory: Negative.   Musculoskeletal: Positive for joint swelling.   .fcp Allergies  Review of patient's allergies indicates no known allergies.  Home Medications   Prior to Admission medications   Medication Sig Start Date End Date Taking? Authorizing Provider  diclofenac sodium (VOLTAREN) 1 % GEL Apply 1 application topically 4 (four) times daily. 10/20/15   Hayden Rasmussenavid Mabe, NP  naproxen (NAPROSYN) 500 MG tablet Take 1 tablet (500 mg total) by mouth 2 (two) times daily. 11/12/15   Tharon AquasFrank C Patrick, PA  traMADol (ULTRAM) 50 MG tablet Take 1 tablet (50 mg total) by mouth every 6 (six) hours as needed. 11/18/15   Tharon AquasFrank C Patrick, PA  trihexyphenidyl (ARTANE) 5 MG tablet Take 5 mg by mouth 2 (two) times daily with a meal.    Historical Provider, MD   Meds Ordered and Administered this Visit  Medications - No data to display  BP 140/72 mmHg  Pulse 72  Temp(Src) 98.6 F (37 C) (Oral)  Resp 16  SpO2 100% No data found.   Physical Exam  Constitutional: She appears well-developed and well-nourished.   Musculoskeletal: She exhibits tenderness.       Left knee: She exhibits effusion. She exhibits normal range of motion and no erythema.  Nursing note and vitals reviewed.   ED Course  Procedures (including critical care time)  Labs Review Labs Reviewed - No data to display  Imaging Review No results found. xrays not repeated  Visual Acuity Review  Right Eye Distance:   Left Eye Distance:   Bilateral Distance:    Right Eye Near:   Left Eye Near:    Bilateral Near:         MDM   1. Knee pain, acute, left   2. Knee effusion, left    Pt is referred to orthopedics for review of effusion and continued knee pain.     Tharon AquasFrank C Patrick, PA 11/18/15 [phone]

## 2015-11-26 ENCOUNTER — Emergency Department (INDEPENDENT_AMBULATORY_CARE_PROVIDER_SITE_OTHER): Payer: Medicaid Other

## 2015-11-26 ENCOUNTER — Emergency Department (INDEPENDENT_AMBULATORY_CARE_PROVIDER_SITE_OTHER)
Admission: EM | Admit: 2015-11-26 | Discharge: 2015-11-26 | Disposition: A | Discharge status: Home / Self Care | Payer: Medicaid Other | Source: Home / Self Care

## 2015-11-26 ENCOUNTER — Encounter (HOSPITAL_COMMUNITY): Payer: Self-pay | Admitting: *Deleted

## 2015-11-26 DIAGNOSIS — M545 Low back pain, unspecified: Secondary | ICD-10-CM

## 2015-11-26 MED ORDER — CYCLOBENZAPRINE HCL 5 MG PO TABS: 5.0000 mg | ORAL_TABLET | Freq: Three times a day (TID) | ORAL | Status: DC | PRN

## 2015-11-26 MED ORDER — KETOROLAC TROMETHAMINE 30 MG/ML IJ SOLN
INTRAMUSCULAR | Status: AC
  Filled 2015-11-26: qty 1

## 2015-11-26 MED ORDER — KETOROLAC TROMETHAMINE 30 MG/ML IJ SOLN
30.0000 mg | Freq: Once | INTRAMUSCULAR | Status: AC
  Administered 2015-11-26: 30 mg via INTRAMUSCULAR

## 2015-11-26 MED ORDER — DICLOFENAC POTASSIUM 50 MG PO TABS: 50.0000 mg | ORAL_TABLET | Freq: Three times a day (TID) | ORAL | Status: DC

## 2015-11-26 NOTE — ED Notes (Signed)
Pt    Reports       Symptoms        Of  Back  Pain            denys    Any          specefic  Injury        Appears  In  No  Acute   Distress

## 2015-11-26 NOTE — ED Provider Notes (Signed)
CSN: [id number]     Arrival date & time 11/26/15  1800 History   None    Chief Complaint  Patient presents with  . Back Pain   (Consider location/radiation/quality/duration/timing/severity/associated sxs/prior Treatment) Patient is a 60 y.o. female presenting with back pain. The history is provided by the patient.  Back Pain Location:  Lumbar spine Quality:  Stiffness Pain severity:  Moderate Duration:  1 week Chronicity:  Recurrent Context: not recent injury   Associated symptoms: leg pain   Associated symptoms: no abdominal pain, no abdominal swelling, no bladder incontinence, no bowel incontinence, no chest pain, no fever, no numbness, no paresthesias, no weakness and no weight loss   Risk factors: no lack of exercise     History reviewed. No pertinent past medical history. Past Surgical History  Procedure Laterality Date  . Cholecystectomy     History reviewed. No pertinent family history. Social History  Substance Use Topics  . Smoking status: Never Smoker   . Smokeless tobacco: None  . Alcohol Use: No   OB History    No data available     Review of Systems  Constitutional: Negative for fever and weight loss.  Cardiovascular: Negative for chest pain.  Gastrointestinal: Negative.  Negative for abdominal pain and bowel incontinence.  Genitourinary: Negative.  Negative for bladder incontinence.  Musculoskeletal: Positive for back pain. Negative for joint swelling and gait problem.  Skin: Negative.   Neurological: Negative for weakness, numbness and paresthesias.    Allergies  Review of patient's allergies indicates no known allergies.  Home Medications   Prior to Admission medications   Medication Sig Start Date End Date Taking? Authorizing Provider  cyclobenzaprine (FLEXERIL) 5 MG tablet Take 1 tablet (5 mg total) by mouth 3 (three) times daily as needed for muscle spasms. 11/26/15   Linna Hoff, MD  diclofenac (CATAFLAM) 50 MG tablet Take 1 tablet (50 mg  total) by mouth 3 (three) times daily. 11/26/15   Linna Hoff, MD  diclofenac sodium (VOLTAREN) 1 % GEL Apply 1 application topically 4 (four) times daily. 10/20/15   Hayden Rasmussen, NP  naproxen (NAPROSYN) 500 MG tablet Take 1 tablet (500 mg total) by mouth 2 (two) times daily. 11/12/15   [name], PA  traMADol (ULTRAM) 50 MG tablet Take 1 tablet (50 mg total) by mouth every 6 (six) hours as needed. 11/18/15   [name], PA  trihexyphenidyl (ARTANE) 5 MG tablet Take 5 mg by mouth 2 (two) times daily with a meal.    Historical Provider, MD   Meds Ordered and Administered this Visit   Medications  ketorolac (TORADOL) 30 MG/ML injection 30 mg (not administered)    BP 166/72 mmHg  Pulse 81  Temp(Src) 97.7 F (36.5 C) (Oral)  Resp 16  SpO2 99% No data found.   Physical Exam  Constitutional: She is oriented to person, place, and time. She appears well-developed and well-nourished.  Abdominal: Soft. Bowel sounds are normal. She exhibits no mass. There is no tenderness. There is no rebound and no guarding.  Musculoskeletal: She exhibits tenderness.       Lumbar back: She exhibits decreased range of motion, bony tenderness, pain and spasm. She exhibits normal pulse.  Neurological: She is alert and oriented to person, place, and time.  Skin: Skin is warm and dry.  Nursing note and vitals reviewed.   ED Course  Procedures (including critical care time)  Labs Review Labs Reviewed - No data to display  Imaging  Review Dg Lumbar Spine Complete  11/26/2015  CLINICAL DATA:  Fall.  Low back pain.  Initial encounter. EXAM: LUMBAR SPINE - COMPLETE 4+ VIEW COMPARISON:  None. FINDINGS: There is no evidence of lumbar spine fracture. Alignment is normal. Mild degenerative disc disease seen at L4-5. Bilateral facet DJD also seen at L4-5 with mild grade 1 degenerative anterolisthesis seen at this level measuring approximately 4 mm. No lytic or sclerotic bone lesions identified. IMPRESSION: No  acute findings. Degenerative spondylosis with grade 1 anterolisthesis at L4-5 Electronically Signed   By: Myles RosenthalJohn  Stahl M.D.   On: 11/26/2015 19:49   X-rays reviewed and report per radiologist.     Visual Acuity Review  Right Eye Distance:   Left Eye Distance:   Bilateral Distance:    Right Eye Near:   Left Eye Near:    Bilateral Near:         MDM   1. Back pain at L4-L5 level       Linna HoffJames D Kindl, MD 11/26/15 2027

## 2015-12-12 ENCOUNTER — Other Ambulatory Visit: Payer: Self-pay | Admitting: Sports Medicine

## 2015-12-12 DIAGNOSIS — M25562 Pain in left knee: Secondary | ICD-10-CM

## 2015-12-13 ENCOUNTER — Emergency Department (HOSPITAL_COMMUNITY)
Admission: EM | Admit: 2015-12-13 | Discharge: 2015-12-13 | Disposition: A | Discharge status: Home / Self Care | Payer: Medicaid Other | Attending: Emergency Medicine | Admitting: Emergency Medicine

## 2015-12-13 ENCOUNTER — Encounter (HOSPITAL_COMMUNITY): Payer: Self-pay | Admitting: *Deleted

## 2015-12-13 ENCOUNTER — Emergency Department (HOSPITAL_COMMUNITY): Payer: Medicaid Other

## 2015-12-13 ENCOUNTER — Encounter (HOSPITAL_COMMUNITY): Payer: Self-pay | Admitting: Emergency Medicine

## 2015-12-13 ENCOUNTER — Emergency Department (HOSPITAL_COMMUNITY)
Admission: EM | Admit: 2015-12-13 | Discharge: 2015-12-13 | Disposition: A | Discharge status: Home / Self Care | Payer: Medicaid Other | Source: Home / Self Care | Attending: Emergency Medicine | Admitting: Emergency Medicine

## 2015-12-13 DIAGNOSIS — T363X5A Adverse effect of macrolides, initial encounter: Secondary | ICD-10-CM

## 2015-12-13 DIAGNOSIS — L233 Allergic contact dermatitis due to drugs in contact with skin: Secondary | ICD-10-CM

## 2015-12-13 DIAGNOSIS — Z791 Long term (current) use of non-steroidal anti-inflammatories (NSAID): Secondary | ICD-10-CM

## 2015-12-13 DIAGNOSIS — J209 Acute bronchitis, unspecified: Secondary | ICD-10-CM | POA: Insufficient documentation

## 2015-12-13 DIAGNOSIS — T450X5A Adverse effect of antiallergic and antiemetic drugs, initial encounter: Secondary | ICD-10-CM | POA: Insufficient documentation

## 2015-12-13 DIAGNOSIS — J069 Acute upper respiratory infection, unspecified: Secondary | ICD-10-CM | POA: Insufficient documentation

## 2015-12-13 DIAGNOSIS — Z79899 Other long term (current) drug therapy: Secondary | ICD-10-CM | POA: Insufficient documentation

## 2015-12-13 DIAGNOSIS — T483X5A Adverse effect of antitussives, initial encounter: Secondary | ICD-10-CM | POA: Insufficient documentation

## 2015-12-13 DIAGNOSIS — J40 Bronchitis, not specified as acute or chronic: Secondary | ICD-10-CM

## 2015-12-13 DIAGNOSIS — T7840XA Allergy, unspecified, initial encounter: Secondary | ICD-10-CM

## 2015-12-13 DIAGNOSIS — H578 Other specified disorders of eye and adnexa: Secondary | ICD-10-CM | POA: Diagnosis not present

## 2015-12-13 DIAGNOSIS — R05 Cough: Secondary | ICD-10-CM | POA: Diagnosis present

## 2015-12-13 DIAGNOSIS — H9209 Otalgia, unspecified ear: Secondary | ICD-10-CM | POA: Insufficient documentation

## 2015-12-13 DIAGNOSIS — R220 Localized swelling, mass and lump, head: Secondary | ICD-10-CM | POA: Insufficient documentation

## 2015-12-13 MED ORDER — CETIRIZINE HCL 10 MG PO TABS: 10.0000 mg | ORAL_TABLET | Freq: Every day | ORAL | Status: DC

## 2015-12-13 MED ORDER — PREDNISONE 20 MG PO TABS
60.0000 mg | ORAL_TABLET | Freq: Once | ORAL | Status: AC
  Administered 2015-12-13: 60 mg via ORAL
  Filled 2015-12-13: qty 3

## 2015-12-13 MED ORDER — AZITHROMYCIN 250 MG PO TABS: 250.0000 mg | ORAL_TABLET | Freq: Every day | ORAL | Status: DC

## 2015-12-13 MED ORDER — PREDNISONE 10 MG PO TABS: ORAL_TABLET | ORAL | Status: DC

## 2015-12-13 MED ORDER — BENZONATATE 100 MG PO CAPS: 100.0000 mg | ORAL_CAPSULE | Freq: Three times a day (TID) | ORAL | Status: DC | PRN

## 2015-12-13 NOTE — ED Notes (Signed)
Pt was just here this morning, diagnosed with bronchitis. Reports no relief with meds given. No distress noted at triage.

## 2015-12-13 NOTE — ED Provider Notes (Signed)
CSN: [id number]     Arrival date & time 12/13/15  1817 History   First MD Initiated Contact with Patient 12/13/15 1835     Chief Complaint  Patient presents with  . Cough     (Consider location/radiation/quality/duration/timing/severity/associated sxs/prior Treatment) HPI Felicia Collier is a 60 y.o. female presents to ED with complaint of facial swelling. Patient reports URI symptoms for a week. She was seen this morning for the same here, prescribed Zithromax, Tessalon Perles, Zyrtec. Her chest x-ray was clear. Diagnosed with bronchitis. Patient states shortly after taking all 3 medications at once, she developed sensation of lip swelling, no swelling, throat swelling. Her family member states that her facial features are actually not swollen to the eye, however patient states is just the sensation. She denies any difficulty breathing, however states that swallowing feels different. She states symptoms started several hours ago. She denies any difficulty breathing. She denies any swelling to the tongue. She denies any rash. She states "most of medicine makes me swell." Patient does not have any documented allergies in the chart.  History reviewed. No pertinent past medical history. Past Surgical History  Procedure Laterality Date  . Cholecystectomy     History reviewed. No pertinent family history. Social History  Substance Use Topics  . Smoking status: Never Smoker   . Smokeless tobacco: None  . Alcohol Use: No   OB History    No data available     Review of Systems  Constitutional: Negative for fever and chills.  HENT: Positive for congestion, facial swelling, sore throat and trouble swallowing.   Respiratory: Positive for cough. Negative for chest tightness and shortness of breath.   Cardiovascular: Negative for chest pain, palpitations and leg swelling.  Gastrointestinal: Negative for nausea, vomiting, abdominal pain and diarrhea.  Musculoskeletal: Negative for myalgias,  arthralgias, neck pain and neck stiffness.  Skin: Negative for rash.  Neurological: Positive for headaches. Negative for dizziness and weakness.  All other systems reviewed and are negative.     Allergies  Review of patient's allergies indicates no known allergies.  Home Medications   Prior to Admission medications   Medication Sig Start Date End Date Taking? Authorizing Provider  azithromycin (ZITHROMAX) 250 MG tablet Take 1 tablet (250 mg total) by mouth daily. Take first 2 tablets together, then 1 every day until finished. 12/13/15   Cheri Fowler, PA-C  benzonatate (TESSALON) 100 MG capsule Take 1 capsule (100 mg total) by mouth 3 (three) times daily as needed for cough. 12/13/15   Cheri Fowler, PA-C  cetirizine (ZYRTEC) 10 MG tablet Take 1 tablet (10 mg total) by mouth daily. 12/13/15   Cheri Fowler, PA-C  cyclobenzaprine (FLEXERIL) 5 MG tablet Take 1 tablet (5 mg total) by mouth 3 (three) times daily as needed for muscle spasms. 11/26/15   Linna Hoff, MD  diclofenac (CATAFLAM) 50 MG tablet Take 1 tablet (50 mg total) by mouth 3 (three) times daily. 11/26/15   Linna Hoff, MD  diclofenac sodium (VOLTAREN) 1 % GEL Apply 1 application topically 4 (four) times daily. 10/20/15   Hayden Rasmussen, NP  naproxen (NAPROSYN) 500 MG tablet Take 1 tablet (500 mg total) by mouth 2 (two) times daily. 11/12/15   [name], PA  traMADol (ULTRAM) 50 MG tablet Take 1 tablet (50 mg total) by mouth every 6 (six) hours as needed. 11/18/15   [name], PA  trihexyphenidyl (ARTANE) 5 MG tablet Take 5 mg by mouth 2 (two) times daily with  a meal.    Historical Provider, MD   BP 125/78 mmHg  Pulse 77  Temp(Src) 98.7 F (37.1 C) (Oral)  Resp 18  SpO2 98% Physical Exam  Constitutional: She is oriented to person, place, and time. She appears well-developed and well-nourished. No distress.  HENT:  Head: Normocephalic.  Right Ear: Tympanic membrane, external ear and ear canal normal.  Left Ear: Tympanic  membrane, external ear and ear canal normal.  Nose: Mucosal edema and rhinorrhea present.  Mouth/Throat: Uvula is midline, oropharynx is clear and moist and mucous membranes are normal.  Nose obvious swelling to the lips, tongue, uvula, oropharynx.  Eyes: Conjunctivae are normal.  Neck: Neck supple.  Cardiovascular: Normal rate, regular rhythm and normal heart sounds.   Pulmonary/Chest: Effort normal and breath sounds normal. No respiratory distress. She has no wheezes. She has no rales.  Musculoskeletal: She exhibits no edema.  Neurological: She is alert and oriented to person, place, and time.  Skin: Skin is warm and dry.  Psychiatric: She has a normal mood and affect. Her behavior is normal.  Nursing note and vitals reviewed.   ED Course  Procedures (including critical care time) Labs Review Labs Reviewed - No data to display  Imaging Review Dg Chest 2 View  12/13/2015  CLINICAL DATA:  Productive cough, chest pain and shortness of breath for 1 week. EXAM: CHEST  2 VIEW COMPARISON:  09/23/2015 chest radiograph FINDINGS: The cardiomediastinal silhouette is unremarkable. There is no evidence of focal airspace disease, pulmonary edema, suspicious pulmonary nodule/mass, pleural effusion, or pneumothorax. No acute bony abnormalities are identified. Cholecystectomy clips identified. IMPRESSION: No active cardiopulmonary disease. Electronically Signed   By: Harmon Pier M.D.   On: 12/13/2015 10:38   I have personally reviewed and evaluated these images and lab results as part of my medical decision-making.   EKG Interpretation None      MDM   Final diagnoses:  Allergic reaction, initial encounter  URI (upper respiratory infection)   Pt in emergency dept with possible allergic reaction to the medications. No evidence of swelling or angioedema based on the exam. Patient appears to be comfortable. No difficulty breathing or swallowing. She continues to have some cough, and she is upset  because her symptoms have not gotten any better. I explained to her that these may take longer than a few hours for medications to work. Will add prednisone for possible allergic reaction to medications. Advised to stop [employer]. Will continue Z-Pak, pt states she has had it before with no reaction. However if patient's symptoms worsen she is instructed to return to emergency department or call 911. Patient's vital signs abnormal at this time. She is in no distress. Prednisone  given in ED. Home with pcp follow up.   Filed Vitals:   12/13/15 1828  BP: 125/78  Pulse: 77  Temp: 98.7 F (37.1 C)  TempSrc: Oral  Resp: 18  SpO2: 98%      Jaynie Crumble, PA-C 12/13/15 1916  Laurence Spates, MD 12/13/15 2306

## 2015-12-13 NOTE — Discharge Instructions (Signed)

## 2015-12-13 NOTE — ED Provider Notes (Signed)
CSN: [id number]     Arrival date & time 12/13/15  4132 History  By signing my name below, I, Felicia Collier, attest that this documentation has been prepared under the direction and in the presence of Cheri Fowler, PA-C. Electronically Signed: Phillis Collier, ED Scribe. 12/13/2015. 10:56 AM.  Chief Complaint  Patient presents with  . Cough  . URI   The history is provided by the patient. No language interpreter was used.  HPI Comments: Felicia Collier is a 60 y.o. female who presents to the Emergency Department complaining of gradually worsening productive cough with thick sputum and congestion onset one week ago. Pt reports associated voice change, fatigue, otalgia, sore throat, watery eyes, rhinorrhea, and chills. No aggravating factors. She has tried Robitussin to mild relief. She denies hx of smoking, hx of HTN, fever, SOB, abdominal pain, nausea, vomiting, or neck pain.   History reviewed. No pertinent past medical history. Past Surgical History  Procedure Laterality Date  . Cholecystectomy     No family history on file. Social History  Substance Use Topics  . Smoking status: Never Smoker   . Smokeless tobacco: None  . Alcohol Use: No   OB History    No data available     Review of Systems  Constitutional: Positive for chills and fatigue. Negative for fever.  HENT: Positive for congestion, ear pain, rhinorrhea, sore throat and voice change.   Eyes: Positive for discharge.  Respiratory: Positive for cough. Negative for shortness of breath.   Gastrointestinal: Negative for nausea, vomiting and abdominal pain.  Musculoskeletal: Negative for neck pain.  All other systems reviewed and are negative.  Allergies  Review of patient's allergies indicates no known allergies.  Home Medications   Prior to Admission medications   Medication Sig Start Date End Date Taking? Authorizing Provider  azithromycin (ZITHROMAX) 250 MG tablet Take 1 tablet (250 mg total) by mouth daily. Take first  2 tablets together, then 1 every day until finished. 12/13/15   Cheri Fowler, PA-C  benzonatate (TESSALON) 100 MG capsule Take 1 capsule (100 mg total) by mouth 3 (three) times daily as needed for cough. 12/13/15   Cheri Fowler, PA-C  cetirizine (ZYRTEC) 10 MG tablet Take 1 tablet (10 mg total) by mouth daily. 12/13/15   Cheri Fowler, PA-C  cyclobenzaprine (FLEXERIL) 5 MG tablet Take 1 tablet (5 mg total) by mouth 3 (three) times daily as needed for muscle spasms. 11/26/15   Linna Hoff, MD  diclofenac (CATAFLAM) 50 MG tablet Take 1 tablet (50 mg total) by mouth 3 (three) times daily. 11/26/15   Linna Hoff, MD  diclofenac sodium (VOLTAREN) 1 % GEL Apply 1 application topically 4 (four) times daily. 10/20/15   Hayden Rasmussen, NP  naproxen (NAPROSYN) 500 MG tablet Take 1 tablet (500 mg total) by mouth 2 (two) times daily. 11/12/15   [name], PA  traMADol (ULTRAM) 50 MG tablet Take 1 tablet (50 mg total) by mouth every 6 (six) hours as needed. 11/18/15   [name], PA  trihexyphenidyl (ARTANE) 5 MG tablet Take 5 mg by mouth 2 (two) times daily with a meal.    Historical Provider, MD   BP 152/83 mmHg  Pulse 77  Temp(Src) 98.3 F (36.8 C) (Oral)  Resp 18  Ht  (1.626 m)  Wt 70.761 kg  BMI 26.76 kg/m2  SpO2 98% Physical Exam  Constitutional: She is oriented to person, place, and time. She appears well-developed and well-nourished.  Non-toxic appearance.  She does not have a sickly appearance. She does not appear ill.  HENT:  Head: Normocephalic and atraumatic.  Right Ear: Tympanic membrane normal. No drainage or tenderness. Tympanic membrane is not erythematous and not bulging.  Left Ear: Tympanic membrane normal. No drainage or tenderness. Tympanic membrane is not erythematous and not bulging.  Nose: Rhinorrhea present.  Mouth/Throat: Oropharynx is clear and moist.  Eyes: Conjunctivae are normal. Pupils are equal, round, and reactive to light.  Neck: Normal range of motion. Neck supple.   No nuchal rigidity.  Cardiovascular: Normal rate, regular rhythm and normal heart sounds.   No murmur heard. Pulmonary/Chest: Effort normal and breath sounds normal. No accessory muscle usage or stridor. No respiratory distress. She has no wheezes. She has no rhonchi. She has no rales.  Abdominal: Soft. Bowel sounds are normal. She exhibits no distension. There is no tenderness.  Musculoskeletal: Normal range of motion.  Lymphadenopathy:    She has no cervical adenopathy.  Neurological: She is alert and oriented to person, place, and time.  Speech clear without dysarthria.  Skin: Skin is warm and dry.  Psychiatric: She has a normal mood and affect. Her behavior is normal.    ED Course  Procedures (including critical care time) DIAGNOSTIC STUDIES: Oxygen Saturation is 98% on RA, normal by my interpretation.    COORDINATION OF CARE: 10:51 AM-Discussed treatment plan which includes anti-biotic with pt at bedside and pt agreed to plan.    Labs Review Labs Reviewed - No data to display  Imaging Review Dg Chest 2 View  12/13/2015  CLINICAL DATA:  Productive cough, chest pain and shortness of breath for 1 week. EXAM: CHEST  2 VIEW COMPARISON:  09/23/2015 chest radiograph FINDINGS: The cardiomediastinal silhouette is unremarkable. There is no evidence of focal airspace disease, pulmonary edema, suspicious pulmonary nodule/mass, pleural effusion, or pneumothorax. No acute bony abnormalities are identified. Cholecystectomy clips identified. IMPRESSION: No active cardiopulmonary disease. Electronically Signed   By: Harmon Pier M.D.   On: 12/13/2015 10:38   I have personally reviewed and evaluated these images and lab results as part of my medical decision-making.   EKG Interpretation None      MDM  Pt CXR negative for acute infiltrate. Patients symptoms are consistent with bronchitis, likely viral etiology; however, given duration of 7 days will d/c home with azithromycin. Pt will be  discharged with symptomatic treatment.  Verbalizes understanding and is agreeable with plan. Pt is hemodynamically stable & in NAD prior to dc.  Final diagnoses:  Bronchitis   I personally performed the services described in this documentation, which was scribed in my presence. The recorded information has been reviewed and is accurate.    Cheri Fowler, PA-C 12/13/15 1102  Arby Barrette, MD 12/13/15 1116

## 2015-12-13 NOTE — Discharge Instructions (Signed)
Continue zpack and zyrtec. Take prednisone as prescribed until all gone. You can also take benadryl. Follow up with primary care doctor for recheck in 2 days. Return if worsening symptoms    Drug Allergy Allergic reactions to medicines are common. Some allergic reactions are mild. A delayed type of drug allergy that occurs 1 week or more after exposure to a medicine or vaccine is called serum sickness. A life-threatening, sudden (acute) allergic reaction that involves the whole body is called anaphylaxis. CAUSES  "True" drug allergies occur when there is an allergic reaction to a medicine. This is caused by overactivity of the immune system. First, the body becomes sensitized. The immune system is triggered by your first exposure to the medicine. Following this first exposure, future exposure to the same medicine may be life-threatening. Almost any medicine can cause an allergic reaction. Common ones are:  Penicillin.  Sulfonamides (sulfa drugs).  Local anesthetics.  X-ray dyes that contain iodine. SYMPTOMS  Common symptoms of a minor allergic reaction are:  Swelling around the mouth.  An itchy red rash or hives.  Vomiting or diarrhea. Anaphylaxis can cause swelling of the mouth and throat. This makes it difficult to breathe and swallow. Severe reactions can be fatal within seconds, even after exposure to only a trace amount of the drug that causes the reaction. HOME CARE INSTRUCTIONS  If you are unsure of what caused your reaction, write down:  The names of the medicines you took.  How much medicine you took.  How you took the medicine, such as whether you took a pill, injected the medicine, or applied it to your skin.  All of the things you ate and drank.  The date and time of your reaction.  The symptoms of the reaction.  You may want to follow up with an allergy specialist after the reaction has cleared in order to be tested to confirm the allergy. It is important to  confirm that your reaction is an allergy, not just a side effect to the medicine. If you have a true allergy to a medicine, this may prevent that medicine and related medicines from being given to you when you are very ill.  If you have hives or a rash:  Take medicines as directed by your caregiver.  You may use an over-the-counter antihistamine (diphenhydramine) as needed.  Apply cold compresses to the skin or take baths in cool water. Avoid hot baths or showers.  If you are severely allergic:  Continuous observation after a severe reaction may be needed. Hospitalization is often required.  Wear a medical alert bracelet or necklace stating your allergy.  You and your family must learn how to use an anaphylaxis kit or give an epinephrine injection to temporarily treat an emergency allergic reaction. If you have had a severe reaction, always carry your epinephrine injection or anaphylaxis kit with you. This can be lifesaving if you have a severe reaction.  Do not drive or perform tasks after treatment until the medicines used to treat your reaction have worn off, or until your caregiver says it is okay.  If you have a drug allergy that was confirmed by your health care provider:  Carry information about the drug allergy with you at all times.  Always check with a pharmacist before taking any over-the-counter medicine. SEEK MEDICAL CARE IF:   You think you had an allergic reaction. Symptoms usually start within 30 minutes after exposure.  Symptoms are getting worse rather than better.  You develop  new symptoms.  The symptoms that brought you to your caregiver return. SEEK IMMEDIATE MEDICAL CARE IF:   You have swelling of the mouth, difficulty breathing, or wheezing.  You have a tight feeling in your chest or throat.  You develop hives, swelling, or itching all over your body.  You develop severe vomiting or diarrhea.  You feel faint or pass out. This is an emergency. Use  your epinephrine injection or anaphylaxis kit as you have been instructed. Call for emergency medical help. Even if you improve after the injection, you need to be examined at a hospital emergency department. MAKE SURE YOU:   Understand these instructions.  Will watch your condition.  Will get help right away if you are not doing well or get worse.   This information is not intended to replace advice given to you by your health care provider. Make sure you discuss any questions you have with your health care provider.   Document Released: 11/01/2005 Document Revised: 11/22/2014 Document Reviewed: 06/03/2015 Elsevier Interactive Patient Education [employer].

## 2015-12-13 NOTE — ED Notes (Signed)
Pt c/o cough and congestion ongoing since Sunday. Pt has tried Robitussin cough medication with some relief. Pt voice hoarse.

## 2015-12-18 ENCOUNTER — Ambulatory Visit
Admission: RE | Admit: 2015-12-18 | Discharge: 2015-12-18 | Disposition: A | Discharge status: Home / Self Care | Payer: Medicaid Other | Source: Ambulatory Visit | Attending: Sports Medicine | Admitting: Sports Medicine

## 2015-12-18 DIAGNOSIS — M25562 Pain in left knee: Secondary | ICD-10-CM

## 2016-02-28 ENCOUNTER — Emergency Department (HOSPITAL_COMMUNITY): Payer: Medicaid Other

## 2016-02-28 ENCOUNTER — Encounter (HOSPITAL_COMMUNITY): Payer: Self-pay

## 2016-02-28 ENCOUNTER — Emergency Department (HOSPITAL_COMMUNITY)
Admission: EM | Admit: 2016-02-28 | Discharge: 2016-02-28 | Disposition: A | Discharge status: Home / Self Care | Payer: Medicaid Other | Attending: Emergency Medicine | Admitting: Emergency Medicine

## 2016-02-28 DIAGNOSIS — R0789 Other chest pain: Secondary | ICD-10-CM

## 2016-02-28 DIAGNOSIS — Z792 Long term (current) use of antibiotics: Secondary | ICD-10-CM | POA: Diagnosis not present

## 2016-02-28 DIAGNOSIS — Z9889 Other specified postprocedural states: Secondary | ICD-10-CM | POA: Insufficient documentation

## 2016-02-28 DIAGNOSIS — M546 Pain in thoracic spine: Secondary | ICD-10-CM | POA: Diagnosis not present

## 2016-02-28 DIAGNOSIS — M25562 Pain in left knee: Secondary | ICD-10-CM | POA: Diagnosis not present

## 2016-02-28 DIAGNOSIS — R079 Chest pain, unspecified: Secondary | ICD-10-CM | POA: Diagnosis present

## 2016-02-28 LAB — CBC WITH DIFFERENTIAL/PLATELET
BASOS ABS: 0 10*3/uL (ref 0.0–0.1)
Basophils Relative: 0 %
EOS ABS: 0.2 10*3/uL (ref 0.0–0.7)
EOS PCT: 2 %
HCT: 39.7 % (ref 36.0–46.0)
Hemoglobin: 12.5 g/dL (ref 12.0–15.0)
LYMPHS PCT: 28 %
Lymphs Abs: 2.1 10*3/uL (ref 0.7–4.0)
MCH: 25.6 pg — ABNORMAL LOW (ref 26.0–34.0)
MCHC: 31.5 g/dL (ref 30.0–36.0)
MCV: 81.4 fL (ref 78.0–100.0)
Monocytes Absolute: 0.5 10*3/uL (ref 0.1–1.0)
Monocytes Relative: 6 %
NEUTROS PCT: 64 %
Neutro Abs: 4.9 10*3/uL (ref 1.7–7.7)
Platelets: 271 10*3/uL (ref 150–400)
RBC: 4.88 MIL/uL (ref 3.87–5.11)
RDW: 14.1 % (ref 11.5–15.5)
WBC: 7.7 10*3/uL (ref 4.0–10.5)

## 2016-02-28 LAB — BASIC METABOLIC PANEL
ANION GAP: 8 (ref 5–15)
BUN: <5 mg/dL — ABNORMAL LOW (ref 6–20)
CALCIUM: 9.1 mg/dL (ref 8.9–10.3)
CHLORIDE: 104 mmol/L (ref 101–111)
CO2: 28 mmol/L (ref 22–32)
CREATININE: 0.68 mg/dL (ref 0.44–1.00)
GFR calc Af Amer: >60 mL/min (ref >60)
GFR calc non Af Amer: >60 mL/min (ref >60)
GLUCOSE: 96 mg/dL (ref 65–99)
Potassium: 3.2 mmol/L — ABNORMAL LOW (ref 3.5–5.1)
Sodium: 140 mmol/L (ref 135–145)

## 2016-02-28 LAB — I-STAT TROPONIN, ED: TROPONIN I, POC: 0 ng/mL (ref 0.00–0.08)

## 2016-02-28 LAB — D-DIMER, QUANTITATIVE (NOT AT ARMC): D DIMER QUANT: 0.58 ug{FEU}/mL — AB (ref 0.00–0.50)

## 2016-02-28 MED ORDER — OXYCODONE-ACETAMINOPHEN 5-325 MG PO TABS
1.0000 | ORAL_TABLET | Freq: Once | ORAL | Status: AC
  Administered 2016-02-28: 1 via ORAL
  Filled 2016-02-28: qty 1

## 2016-02-28 MED ORDER — IOPAMIDOL (ISOVUE-370) INJECTION 76%
INTRAVENOUS | Status: AC
  Administered 2016-02-28: 100 mL
  Filled 2016-02-28: qty 100

## 2016-02-28 NOTE — ED Notes (Signed)
Pt now c/o of CP.

## 2016-02-28 NOTE — Discharge Instructions (Signed)

## 2016-02-28 NOTE — ED Provider Notes (Signed)
CSN: [id number]     Arrival date & time 02/28/16  0103 History   First MD Initiated Contact with Patient 02/28/16 0435     Chief Complaint  Patient presents with  . Back Pain  . Knee Pain  . Chest Pain     (Consider location/radiation/quality/duration/timing/severity/associated sxs/prior Treatment) HPI  This is a 60 year old female who presents with right upper back pain, chest pain, left knee pain. Patient reports that she had surgery on her left knee in February. She continues to have some pain in that left knee which is worse with ambulation. She states that over the last week she has had increasing chest and right back pain that is worse with exertion. Denies any shortness of breath. Denies any leg swelling. Denies any fevers or cough. Denies any history of smoking, hypercholesterolemia, hypertension. No early family history of heart disease. She denies chest pain at this time.  History reviewed. No pertinent past medical history. Past Surgical History  Procedure Laterality Date  . Cholecystectomy     No family history on file. Social History  Substance Use Topics  . Smoking status: Never Smoker   . Smokeless tobacco: None  . Alcohol Use: No   OB History    No data available     Review of Systems  Constitutional: Negative for fever.  Respiratory: Negative for cough and shortness of breath.   Cardiovascular: Positive for chest pain.  Gastrointestinal: Negative for nausea, vomiting and abdominal pain.  Musculoskeletal: Positive for back pain.       Knee pain  All other systems reviewed and are negative.     Allergies  Review of patient's allergies indicates no known allergies.  Home Medications   Prior to Admission medications   Medication Sig Start Date End Date Taking? Authorizing Provider  celecoxib (CELEBREX) 100 MG capsule Take 100 mg by mouth 2 (two) times daily.   Yes Historical Provider, MD  ibuprofen (ADVIL,MOTRIN) 200 MG tablet Take 400 mg by mouth  every 6 (six) hours as needed for moderate pain.   Yes Historical Provider, MD  oxyCODONE-acetaminophen (PERCOCET/ROXICET) 5-325 MG tablet Take 1 tablet by mouth every 4 (four) hours as needed for severe pain.   Yes Historical Provider, MD   BP 132/98 mmHg  Pulse 58  Temp(Src) 98 F (36.7 C) (Oral)  Resp 20  SpO2 100% Physical Exam  Constitutional: She is oriented to person, place, and time. She appears well-developed and well-nourished. No distress.  HENT:  Head: Normocephalic and atraumatic.  Cardiovascular: Normal rate, regular rhythm and normal heart sounds.   No murmur heard. Pulmonary/Chest: Effort normal and breath sounds normal. No respiratory distress. She has no wheezes. She exhibits no tenderness.  Abdominal: Soft. Bowel sounds are normal. There is no tenderness. There is no rebound.  Musculoskeletal: Normal range of motion. She exhibits no edema.  Pain with range of motion of the left knee, no obvious swelling, skin changes  Neurological: She is alert and oriented to person, place, and time.  Skin: Skin is warm and dry.  Psychiatric: She has a normal mood and affect.  Nursing note and vitals reviewed.   ED Course  Procedures (including critical care time) Labs Review Labs Reviewed  D-DIMER, QUANTITATIVE (NOT AT [facility]) - Abnormal; Notable for the following:    D-Dimer, Quant 0.58 (*)    All other components within normal limits  CBC WITH DIFFERENTIAL/PLATELET - Abnormal; Notable for the following:    MCH 25.6 (*)    All other  components within normal limits  BASIC METABOLIC PANEL - Abnormal; Notable for the following:    Potassium 3.2 (*)    BUN <5 (*)    All other components within normal limits  I-STAT TROPOININ, ED    Imaging Review Dg Chest 2 View  02/28/2016  CLINICAL DATA:  Chest pain, back pain and dyspnea for 4 days EXAM: CHEST  2 VIEW COMPARISON:  12/13/2015 FINDINGS: The heart size and mediastinal contours are within normal limits. Both lungs are clear.  The visualized skeletal structures are unremarkable. IMPRESSION: No active cardiopulmonary disease. Electronically Signed   By: Ellery Plunk M.D.   On: 02/28/2016 06:06   I have personally reviewed and evaluated these images and lab results as part of my medical decision-making.   EKG Interpretation   Date/Time:  Saturday February 28 2016 06:33:09 EDT Ventricular Rate:  48 PR Interval:  151 QRS Duration: 90 QT Interval:  495 QTC Calculation: 442 R Axis:   29 Text Interpretation:  Sinus bradycardia Confirmed by HORTON  MD, COURTNEY  ([id number]) on 02/28/2016 7:52:41 AM      MDM   Final diagnoses:  None    Patient presents with chest and back pain as well as persistent left knee pain. She is nontoxic on exam. Vital signs are reassuring. She did recently have surgery in February. She has no other signs or symptoms of DVT but given chest pain with exertion, will obtain a d-dimer. Chest x-ray, troponin, and Basic labwork is otherwise reassuring. D-dimer is 0.58. Age-adjusted would be <0.58.  For this reason, CT of the chest was obtained.  Signed out to Dr. Madilyn Hook. If CT is negative, patient will need follow-up with cardiology. She is relatively low risk.   Shon Baton, MD 02/28/16 [phone]

## 2016-02-28 NOTE — ED Notes (Signed)
Pt reports right upper back pain (onset Thursday) and also left knee pain (ongoing for years - multiple surgeries). Pt ambulatory at triage. Pt unsure how she injured her right shoulder/upper back.

## 2016-02-28 NOTE — ED Provider Notes (Signed)
Patient visit shared. Patient's pain is gone on recheck. Presentation is not that of ACS. No evidence of PE. Discussed home care,outpatient follow up, return precautions.  Tilden FossaElizabeth Jennaya Pogue, MD 02/28/16 1718

## 2016-03-01 ENCOUNTER — Encounter (HOSPITAL_COMMUNITY): Payer: Self-pay

## 2016-03-01 ENCOUNTER — Ambulatory Visit (HOSPITAL_COMMUNITY)
Admission: EM | Admit: 2016-03-01 | Discharge: 2016-03-01 | Disposition: A | Discharge status: Home / Self Care | Payer: Medicaid Other | Attending: Family Medicine | Admitting: Family Medicine

## 2016-03-01 DIAGNOSIS — R0789 Other chest pain: Secondary | ICD-10-CM

## 2016-03-01 DIAGNOSIS — R079 Chest pain, unspecified: Secondary | ICD-10-CM

## 2016-03-01 MED ORDER — KETOROLAC TROMETHAMINE 60 MG/2ML IM SOLN
INTRAMUSCULAR | Status: AC
  Filled 2016-03-01: qty 2

## 2016-03-01 MED ORDER — NAPROXEN SODIUM 550 MG PO TABS: 550.0000 mg | ORAL_TABLET | Freq: Two times a day (BID) | ORAL | Status: DC

## 2016-03-01 MED ORDER — KETOROLAC TROMETHAMINE 60 MG/2ML IM SOLN
60.0000 mg | Freq: Once | INTRAMUSCULAR | Status: AC
  Administered 2016-03-01: 60 mg via INTRAMUSCULAR

## 2016-03-01 NOTE — ED Notes (Signed)
59 y.o./female presents with chest pain and SOB x3 weeks, patient states pain is more intense at night and unable to sleep. Patient went to ED on 02/28/2016 EKG with normal limits and chest x-ray. No medication has been taken for pain

## 2016-03-01 NOTE — Discharge Instructions (Signed)
Nonspecific Chest Pain  °Chest pain can be caused by many different conditions. There is always a chance that your pain could be related to something serious, such as a heart attack or a blood clot in your lungs. Chest pain can also be caused by conditions that are not life-threatening. If you have chest pain, it is very important to follow up with your health care provider. °CAUSES  °Chest pain can be caused by: °· Heartburn. °· Pneumonia or bronchitis. °· Anxiety or stress. °· Inflammation around your heart (pericarditis) or lung (pleuritis or pleurisy). °· A blood clot in your lung. °· A collapsed lung (pneumothorax). It can develop suddenly on its own (spontaneous pneumothorax) or from trauma to the chest. °· Shingles infection (varicella-zoster virus). °· Heart attack. °· Damage to the bones, muscles, and cartilage that make up your chest wall. This can include: °¨ Bruised bones due to injury. °¨ Strained muscles or cartilage due to frequent or repeated coughing or overwork. °¨ Fracture to one or more ribs. °¨ Sore cartilage due to inflammation (costochondritis). °RISK FACTORS  °Risk factors for chest pain may include: °· Activities that increase your risk for trauma or injury to your chest. °· Respiratory infections or conditions that cause frequent coughing. °· Medical conditions or overeating that can cause heartburn. °· Heart disease or family history of heart disease. °· Conditions or health behaviors that increase your risk of developing a blood clot. °· Having had chicken pox (varicella zoster). °SIGNS AND SYMPTOMS °Chest pain can feel like: °· Burning or tingling on the surface of your chest or deep in your chest. °· Crushing, pressure, aching, or squeezing pain. °· Dull or sharp pain that is worse when you move, cough, or take a deep breath. °· Pain that is also felt in your back, neck, shoulder, or arm, or pain that spreads to any of these areas. °Your chest pain may come and go, or it may stay  constant. °DIAGNOSIS °Lab tests or other studies may be needed to find the cause of your pain. Your health care provider may have you take a test called an ambulatory ECG (electrocardiogram). An ECG records your heartbeat patterns at the time the test is performed. You may also have other tests, such as: °· Transthoracic echocardiogram (TTE). During echocardiography, sound waves are used to create a picture of all of the heart structures and to look at how blood flows through your heart. °· Transesophageal echocardiogram (TEE). This is a more advanced imaging test that obtains images from inside your body. It allows your health care provider to see your heart in finer detail. °· Cardiac monitoring. This allows your health care provider to monitor your heart rate and rhythm in real time. °· Holter monitor. This is a portable device that records your heartbeat and can help to diagnose abnormal heartbeats. It allows your health care provider to track your heart activity for several days, if needed. °· Stress tests. These can be done through exercise or by taking medicine that makes your heart beat more quickly. °· Blood tests. °· Imaging tests. °TREATMENT  °Your treatment depends on what is causing your chest pain. Treatment may include: °· Medicines. These may include: °¨ Acid blockers for heartburn. °¨ Anti-inflammatory medicine. °¨ Pain medicine for inflammatory conditions. °¨ Antibiotic medicine, if an infection is present. °¨ Medicines to dissolve blood clots. °¨ Medicines to treat coronary artery disease. °· Supportive care for conditions that do not require medicines. This may include: °¨ Resting. °¨ Applying heat   or cold packs to injured areas. °¨ Limiting activities until pain decreases. °HOME CARE INSTRUCTIONS °· If you were prescribed an antibiotic medicine, finish it all even if you start to feel better. °· Avoid any activities that bring on chest pain. °· Do not use any tobacco products, including  cigarettes, chewing tobacco, or electronic cigarettes. If you need help quitting, ask your health care provider. °· Do not drink alcohol. °· Take medicines only as directed by your health care provider. °· Keep all follow-up visits as directed by your health care provider. This is important. This includes any further testing if your chest pain does not go away. °· If heartburn is the cause for your chest pain, you may be told to keep your head raised (elevated) while sleeping. This reduces the chance that acid will go from your stomach into your esophagus. °· Make lifestyle changes as directed by your health care provider. These may include: °¨ Getting regular exercise. Ask your health care provider to suggest some activities that are safe for you. °¨ Eating a heart-healthy diet. A registered dietitian can help you to learn healthy eating options. °¨ Maintaining a healthy weight. °¨ Managing diabetes, if necessary. °¨ Reducing stress. °SEEK MEDICAL CARE IF: °· Your chest pain does not go away after treatment. °· You have a rash with blisters on your chest. °· You have a fever. °SEEK IMMEDIATE MEDICAL CARE IF:  °· Your chest pain is worse. °· You have an increasing cough, or you cough up blood. °· You have severe abdominal pain. °· You have severe weakness. °· You faint. °· You have chills. °· You have sudden, unexplained chest discomfort. °· You have sudden, unexplained discomfort in your arms, back, neck, or jaw. °· You have shortness of breath at any time. °· You suddenly start to sweat, or your skin gets clammy. °· You feel nauseous or you vomit. °· You suddenly feel light-headed or dizzy. °· Your heart begins to beat quickly, or it feels like it is skipping beats. °These symptoms may represent a serious problem that is an emergency. Do not wait to see if the symptoms will go away. Get medical help right away. Call your local emergency services (911 in the [place] Do not drive yourself to the hospital. °  °This  information is not intended to replace advice given to you by your health care provider. Make sure you discuss any questions you have with your health care provider. °  °Document Released: 08/11/2005 Document Revised: 11/22/2014 Document Reviewed: 06/07/2014 °Elsevier Interactive Patient Education ©2016 [employer] ° °

## 2016-03-01 NOTE — ED Provider Notes (Signed)
CSN: [id number]     Arrival date & time 03/01/16  1708 History   First MD Initiated Contact with Patient 03/01/16 1812     Chief Complaint  Patient presents with  . Chest Pain   (Consider location/radiation/quality/duration/timing/severity/associated sxs/prior Treatment) HPI Pt is here for continued chest pain. Was seen in the ER 2 days ago,with complete pulmonary workup and no acute pathology found. Pt states that she was not given or has not used any analgesia. The sam pain as she has had. No new symptoms History reviewed. No pertinent past medical history. Past Surgical History  Procedure Laterality Date  . Cholecystectomy     No family history on file. Social History  Substance Use Topics  . Smoking status: Never Smoker   . Smokeless tobacco: None  . Alcohol Use: No   OB History    No data available     Review of Systems Chest pain Allergies  Review of patient's allergies indicates no known allergies.  Home Medications   Prior to Admission medications   Medication Sig Start Date End Date Taking? Authorizing Provider  ibuprofen (ADVIL,MOTRIN) 200 MG tablet Take 400 mg by mouth every 6 (six) hours as needed for moderate pain.   Yes Historical Provider, MD  celecoxib (CELEBREX) 100 MG capsule Take 100 mg by mouth 2 (two) times daily.    Historical Provider, MD  oxyCODONE-acetaminophen (PERCOCET/ROXICET) 5-325 MG tablet Take 1 tablet by mouth every 4 (four) hours as needed for severe pain.    Historical Provider, MD   Meds Ordered and Administered this Visit   Medications  ketorolac (TORADOL) injection 60 mg (not administered)    BP 135/78 mmHg  Pulse 78  Temp(Src) 98.7 F (37.1 C) (Oral)  Resp 16  SpO2 98% No data found.   Physical Exam NURSES NOTES AND VITAL SIGNS REVIEWED. CONSTITUTIONAL: Well developed, well nourished, no acute distress HEENT: normocephalic, atraumatic EYES: Conjunctiva normal NECK:normal ROM, supple, no adenopathy PULMONARY:No  respiratory distress, normal effort, chest wall is without tenderness.  MUSCULOSKELETAL: Normal ROM of all extremities,  SKIN: warm and dry without rash PSYCHIATRIC: Mood and affect, behavior are normal  ED Course  Procedures (including critical care time)  Labs Review Labs Reviewed - No data to display  Imaging Review No results found.   Visual Acuity Review  Right Eye Distance:   Left Eye Distance:   Bilateral Distance:    Right Eye Near:   Left Eye Near:    Bilateral Near:      rx anaprox 500 mg, heat compresses to chest wall and follow up wit your PCP.    MDM   1. Chest pain of uncertain etiology     Patient is reassured that there are no issues that require transfer to higher level of care at this time or additional tests. Patient is advised to continue home symptomatic treatment. Patient is advised that if there are new or worsening symptoms to attend the emergency department, contact primary care provider, or return to UC. Instructions of care provided discharged home in stable condition.    THIS NOTE WAS GENERATED USING A VOICE RECOGNITION SOFTWARE PROGRAM. ALL REASONABLE EFFORTS  WERE MADE TO PROOFREAD THIS DOCUMENT FOR ACCURACY.  I have verbally reviewed the discharge instructions with the patient. A printed AVS was given to the patient.  All questions were answered prior to discharge.      Tharon AquasFrank C Patrick, PA 03/01/16 1925

## 2016-03-06 ENCOUNTER — Emergency Department (HOSPITAL_COMMUNITY)
Admission: EM | Admit: 2016-03-06 | Discharge: 2016-03-06 | Disposition: A | Discharge status: Home / Self Care | Payer: Medicaid Other | Attending: Emergency Medicine | Admitting: Emergency Medicine

## 2016-03-06 ENCOUNTER — Emergency Department (HOSPITAL_COMMUNITY): Payer: Medicaid Other

## 2016-03-06 ENCOUNTER — Encounter (HOSPITAL_COMMUNITY): Payer: Self-pay | Admitting: Family Medicine

## 2016-03-06 DIAGNOSIS — K2960 Other gastritis without bleeding: Secondary | ICD-10-CM

## 2016-03-06 DIAGNOSIS — R079 Chest pain, unspecified: Secondary | ICD-10-CM | POA: Diagnosis present

## 2016-03-06 DIAGNOSIS — Z8659 Personal history of other mental and behavioral disorders: Secondary | ICD-10-CM | POA: Diagnosis not present

## 2016-03-06 DIAGNOSIS — K219 Gastro-esophageal reflux disease without esophagitis: Secondary | ICD-10-CM | POA: Insufficient documentation

## 2016-03-06 DIAGNOSIS — R0789 Other chest pain: Secondary | ICD-10-CM | POA: Insufficient documentation

## 2016-03-06 DIAGNOSIS — Z791 Long term (current) use of non-steroidal anti-inflammatories (NSAID): Secondary | ICD-10-CM | POA: Insufficient documentation

## 2016-03-06 LAB — CBC
HCT: 40.5 % (ref 36.0–46.0)
Hemoglobin: 12.5 g/dL (ref 12.0–15.0)
MCH: 25.7 pg — ABNORMAL LOW (ref 26.0–34.0)
MCHC: 30.9 g/dL (ref 30.0–36.0)
MCV: 83.2 fL (ref 78.0–100.0)
Platelets: 290 K/uL (ref 150–400)
RBC: 4.87 MIL/uL (ref 3.87–5.11)
RDW: 14.6 % (ref 11.5–15.5)
WBC: 8.1 K/uL (ref 4.0–10.5)

## 2016-03-06 LAB — BASIC METABOLIC PANEL
Anion gap: 11 (ref 5–15)
BUN: 8 mg/dL (ref 6–20)
CHLORIDE: 103 mmol/L (ref 101–111)
CO2: 26 mmol/L (ref 22–32)
CREATININE: 0.81 mg/dL (ref 0.44–1.00)
Calcium: 8.8 mg/dL — ABNORMAL LOW (ref 8.9–10.3)
GFR calc Af Amer: >60 mL/min (ref >60)
GFR calc non Af Amer: >60 mL/min (ref >60)
Glucose, Bld: 89 mg/dL (ref 65–99)
Potassium: 3.6 mmol/L (ref 3.5–5.1)
Sodium: 140 mmol/L (ref 135–145)

## 2016-03-06 LAB — HEPATIC FUNCTION PANEL
ALT: 15 U/L (ref 14–54)
AST: 20 U/L (ref 15–41)
Albumin: 3.4 g/dL — ABNORMAL LOW (ref 3.5–5.0)
Alkaline Phosphatase: 82 U/L (ref 38–126)
Bilirubin, Direct: <0.1 mg/dL — ABNORMAL LOW (ref 0.1–0.5)
Total Bilirubin: 0.8 mg/dL (ref 0.3–1.2)
Total Protein: 6.9 g/dL (ref 6.5–8.1)

## 2016-03-06 LAB — DIFFERENTIAL
Basophils Absolute: 0 K/uL (ref 0.0–0.1)
Basophils Relative: 0 %
Eosinophils Absolute: 0.2 K/uL (ref 0.0–0.7)
Eosinophils Relative: 2 %
Lymphocytes Relative: 25 %
Lymphs Abs: 2 K/uL (ref 0.7–4.0)
Monocytes Absolute: 0.5 K/uL (ref 0.1–1.0)
Monocytes Relative: 7 %
Neutro Abs: 5.4 K/uL (ref 1.7–7.7)
Neutrophils Relative %: 66 %

## 2016-03-06 LAB — I-STAT TROPONIN, ED: Troponin i, poc: 0 ng/mL (ref 0.00–0.08)

## 2016-03-06 LAB — LIPASE, BLOOD: Lipase: 31 U/L (ref 11–51)

## 2016-03-06 MED ORDER — GI COCKTAIL ~~LOC~~
30.0000 mL | Freq: Once | ORAL | Status: AC
  Administered 2016-03-06: 30 mL via ORAL
  Filled 2016-03-06: qty 30

## 2016-03-06 MED ORDER — OMEPRAZOLE 20 MG PO CPDR: 20.0000 mg | DELAYED_RELEASE_CAPSULE | Freq: Every day | ORAL | Status: DC

## 2016-03-06 NOTE — ED Notes (Signed)
Pt here for central chest pain x 1 week that has been constant. sts burning and worse after eating and breathing. Has ben taking naproxen without relief.

## 2016-03-06 NOTE — ED Notes (Signed)
Updated pt. On plan of care 

## 2016-03-06 NOTE — ED Provider Notes (Signed)
CSN: [id number]     Arrival date & time 03/06/16  0709 History   First MD Initiated Contact with Patient 03/06/16 [phone]     Chief Complaint  Patient presents with  . Chest Pain     (Consider location/radiation/quality/duration/timing/severity/associated sxs/prior Treatment) HPI 60 year old female who presents with chest pain. She has history of schizophrenia and cholecystectomy. Reports now over 2 weeks of chest pain, which she reports typically is ongoing throughout the course of the entire day but she primarily notices this at night. It is worse whenever she is lying back to sleep, and keeps her up at night. States that it is burning in nature and radiates to the back of her throat. It is associated with some shortness of breath. Denies any leg swelling, fevers or chills. Has had minimal cough and congestion. No orthopnea or PND. He is unsure if symptoms may also be exacerbated by eating but also notes epigastric pain associated with this chest discomfort. Has not had any melena, hematochezia, nausea or vomiting, or urinary complaints. Was seen in the emergency department one week ago for evaluation of the symptoms. Had negative ACS workup and had a CT PE that was negative for PE or any other acute intrathoracic processes. She states that she has been taking Naprosyn during this period of time, 4 times a day for her pain which only makes it worse.  History reviewed. No pertinent past medical history. Past Surgical History  Procedure Laterality Date  . Cholecystectomy     History reviewed. No pertinent family history. Social History  Substance Use Topics  . Smoking status: Never Smoker   . Smokeless tobacco: None  . Alcohol Use: No   OB History    No data available     Review of Systems 10/14 systems reviewed and are negative other than those stated in the HPI    Allergies  Review of patient's allergies indicates no known allergies.  Home Medications   Prior to Admission  medications   Medication Sig Start Date End Date Taking? Authorizing Provider  naproxen sodium (ANAPROX DS) 550 MG tablet Take 1 tablet (550 mg total) by mouth 2 (two) times daily with a meal. 03/01/16  Yes Tharon AquasFrank C Patrick, PA  omeprazole (PRILOSEC) 20 MG capsule Take 1 capsule (20 mg total) by mouth daily. 03/06/16   Lavera Guiseana Duo Davyon Fisch, MD   BP 147/71 mmHg  Pulse 45  Resp 17  SpO2 99% Physical Exam Physical Exam  Nursing note and vitals reviewed. Constitutional: Well developed, well nourished, non-toxic, and in no acute distress Head: Normocephalic and atraumatic.  Mouth/Throat: Oropharynx is clear and moist.  Neck: Normal range of motion. Neck supple.  Cardiovascular: Normal rate and regular rhythm.   Pulmonary/Chest: Effort normal and breath sounds normal.  Abdominal: Soft. There is minimal epigastric tenderness. There is no rebound and no guarding.  Musculoskeletal: Normal range of motion.  Neurological: Alert, no facial droop, fluent speech, moves all extremities symmetrically Skin: Skin is warm and dry.  Psychiatric: Cooperative  ED Course  Procedures (including critical care time) Labs Review Labs Reviewed  BASIC METABOLIC PANEL - Abnormal; Notable for the following:    Calcium 8.8 (*)    All other components within normal limits  CBC - Abnormal; Notable for the following:    MCH 25.7 (*)    All other components within normal limits  HEPATIC FUNCTION PANEL - Abnormal; Notable for the following:    Albumin 3.4 (*)    Bilirubin, Direct <0.1 (*)  All other components within normal limits  DIFFERENTIAL  LIPASE, BLOOD  I-STAT TROPOININ, ED    Imaging Review Dg Chest 2 View  03/06/2016  CLINICAL DATA:  Pt reports tightness and burning in the center of her chest and shortness of breath for over 1 week; she reports she was seen here for the same symptoms 1 week ago but still has symptoms; non-smoker EXAM: CHEST  2 VIEW COMPARISON:  02/28/2016 heart size is upper limits normal.  There are no focal consolidations or pleural effusions. No pulmonary edema. Mid thoracic spondylosis. Surgical clips are noted in the right upper quadrant of the abdomen. FINDINGS: No evidence for acute cardiopulmonary abnormality. IMPRESSION: No active cardiopulmonary disease. Electronically Signed   By: Norva Pavlov M.D.   On: 03/06/2016 07:59   I have personally reviewed and evaluated these images and lab results as part of my medical decision-making.   EKG Interpretation   Date/Time:  Saturday March 06 2016 07:17:23 EDT Ventricular Rate:  62 PR Interval:  136 QRS Duration: 91 QT Interval:  424 QTC Calculation: 431 R Axis:   33 Text Interpretation:  Sinus rhythm No acute changes Confirmed by Jakeob Tullis MD,  Estie Sproule ([id number]) on 03/06/2016 7:32:41 AM      MDM   Final diagnoses:  Atypical chest pain  Reflux gastritis    60 year old female who presents with chest pain. This seems very GI related, especially in the setting of taking multiple doses of anti-inflammatory medications for her pain. She has no major risk factors for ACS, and pain does not seem consistent with that especially given ongoing symptoms over 2 weeks. Symptoms also atypical for that of ACS.  She has also been ruled out for other acute intrathoracic processes during the symptoms with negative CT PE. Her EKG today shows no acute ischemic changes and unchanged from prior. Negative troponin.  We will obtain basic set of blood work, CXR, and she will be given a GI cocktail. I will have her discontinue taking Naprosyn and start her on Pepcid.  Basic blood work is unremarkable. Her chest x-ray is visualized and shows no acute cardiopulmonary processes. A significant improved after GI cocktail. She is stable and felt appropriate for discharge home. Prescribed a course of Prilosec and discussed discontinuation of her Naprosyn. She will follow-up with her primary care doctor for ongoing management.    Lavera Guise, MD 03/06/16  [phone]

## 2016-03-06 NOTE — Discharge Instructions (Signed)
Your pain does not seem related to your heart. Stop taking naprosyn as this is irritating your stomach and it is likely reflux of your stomach acid that is causing your pain. Taking medications as prescribed. Return without fail for worsening symptoms including worsening pain, difficulty breathing, passing out, or any other symptoms concerning to you.  Gastritis, Adult Gastritis is soreness and puffiness (inflammation) of the lining of the stomach. If you do not get help, gastritis can cause bleeding and sores (ulcers) in the stomach. HOME CARE   Only take medicine as told by your doctor.  If you were given antibiotic medicines, take them as told. Finish the medicines even if you start to feel better.  Drink enough fluids to keep your pee (urine) clear or pale yellow.  Avoid foods and drinks that make your problems worse. Foods you may want to avoid include:  Caffeine or alcohol.  Chocolate.  Mint.  Garlic and onions.  Spicy foods.  Citrus fruits, including oranges, lemons, or limes.  Food containing tomatoes, including sauce, chili, salsa, and pizza.  Fried and fatty foods.  Eat small meals throughout the day instead of large meals. GET HELP RIGHT AWAY IF:   You have black or dark red poop (stools).  You throw up (vomit) blood. It may look like coffee grounds.  You cannot keep fluids down.  Your belly (abdominal) pain gets worse.  You have a fever.  You do not feel better after 1 week.  You have any other questions or concerns. MAKE SURE YOU:   Understand these instructions.  Will watch your condition.  Will get help right away if you are not doing well or get worse.   This information is not intended to replace advice given to you by your health care provider. Make sure you discuss any questions you have with your health care provider.   Document Released: 04/19/2008 Document Revised: 01/24/2012 Document Reviewed: 12/15/2011 Elsevier Interactive Patient  Education 2016 [employer]  Nonspecific Chest Pain It is often hard to find the cause of chest pain. There is always a chance that your pain could be related to something serious, such as a heart attack or a blood clot in your lungs. Chest pain can also be caused by conditions that are not life-threatening. If you have chest pain, it is very important to follow up with your doctor.  HOME CARE  If you were prescribed an antibiotic medicine, finish it all even if you start to feel better.  Avoid any activities that cause chest pain.  Do not use any tobacco products, including cigarettes, chewing tobacco, or electronic cigarettes. If you need help quitting, ask your doctor.  Do not drink alcohol.  Take medicines only as told by your doctor.  Keep all follow-up visits as told by your doctor. This is important. This includes any further testing if your chest pain does not go away.  Your doctor may tell you to keep your head raised (elevated) while you sleep.  Make lifestyle changes as told by your doctor. These may include:  Getting regular exercise. Ask your doctor to suggest some activities that are safe for you.  Eating a heart-healthy diet. Your doctor or a diet specialist (dietitian) can help you to learn healthy eating options.  Maintaining a healthy weight.  Managing diabetes, if necessary.  Reducing stress. GET HELP IF:  Your chest pain does not go away, even after treatment.  You have a rash with blisters on your chest.  You  have a fever. GET HELP RIGHT AWAY IF:  Your chest pain is worse.  You have an increasing cough, or you cough up blood.  You have severe belly (abdominal) pain.  You feel extremely weak.  You pass out (faint).  You have chills.  You have sudden, unexplained chest discomfort.  You have sudden, unexplained discomfort in your arms, back, neck, or jaw.  You have shortness of breath at any time.  You suddenly start to sweat, or your  skin gets clammy.  You feel nauseous.  You vomit.  You suddenly feel light-headed or dizzy.  Your heart begins to beat quickly, or it feels like it is skipping beats. These symptoms may be an emergency. Do not wait to see if the symptoms will go away. Get medical help right away. Call your local emergency services (911 in the [place] Do not drive yourself to the hospital.   This information is not intended to replace advice given to you by your health care provider. Make sure you discuss any questions you have with your health care provider.   Document Released: 04/19/2008 Document Revised: 11/22/2014 Document Reviewed: 06/07/2014 Elsevier Interactive Patient Education [employer]

## 2016-03-10 ENCOUNTER — Encounter (HOSPITAL_COMMUNITY): Payer: Self-pay | Admitting: Emergency Medicine

## 2016-03-10 DIAGNOSIS — Z79899 Other long term (current) drug therapy: Secondary | ICD-10-CM | POA: Diagnosis not present

## 2016-03-10 DIAGNOSIS — F209 Schizophrenia, unspecified: Secondary | ICD-10-CM | POA: Insufficient documentation

## 2016-03-10 DIAGNOSIS — M549 Dorsalgia, unspecified: Secondary | ICD-10-CM | POA: Diagnosis present

## 2016-03-10 DIAGNOSIS — G8929 Other chronic pain: Secondary | ICD-10-CM | POA: Diagnosis not present

## 2016-03-10 DIAGNOSIS — M545 Low back pain: Secondary | ICD-10-CM | POA: Insufficient documentation

## 2016-03-10 DIAGNOSIS — Z791 Long term (current) use of non-steroidal anti-inflammatories (NSAID): Secondary | ICD-10-CM | POA: Diagnosis not present

## 2016-03-10 NOTE — ED Notes (Signed)
Pt. reports generalized body aches and bilateral leg pain onset this week , denies injury /ambulatory , poor historian during triage / unable to focus during encounter.

## 2016-03-11 ENCOUNTER — Emergency Department (HOSPITAL_COMMUNITY)
Admission: EM | Admit: 2016-03-11 | Discharge: 2016-03-11 | Disposition: A | Discharge status: Home / Self Care | Payer: Medicaid Other | Attending: Emergency Medicine | Admitting: Emergency Medicine

## 2016-03-11 DIAGNOSIS — M545 Low back pain, unspecified: Secondary | ICD-10-CM

## 2016-03-11 DIAGNOSIS — F209 Schizophrenia, unspecified: Secondary | ICD-10-CM

## 2016-03-11 HISTORY — DX: Other chronic pain: G89.29

## 2016-03-11 HISTORY — DX: Schizophrenia, unspecified: F20.9

## 2016-03-11 HISTORY — DX: Pain in unspecified knee: M25.569

## 2016-03-11 NOTE — ED Provider Notes (Signed)
CSN: [id number]     Arrival date & time 03/10/16  2240 History   By signing my name below, I, Arlan Organ, attest that this documentation has been prepared under the direction and in the presence of Gilda Crease, MD.  Electronically Signed: Arlan Organ, ED Scribe. 03/11/2016. 1:22 AM.   Chief Complaint  Patient presents with  . Generalized Body Aches  . Leg Pain   The history is provided by the patient. No language interpreter was used.    HPI Comments: Felicia Collier is a 60 y.o. female with a PMHx of schizophrenia and chronic knee pain who presents to the Emergency Department complaining of constant, ongoing bilateral knee pain x 1 week. No recent falls, injuries, or trauma to knees. No aggravating or alleviating factors reported. Pt also reports ongoing lower back pain and generalized body aches. Pt states  i think something may have crawled into me" and attributes this to her back pain.  No OTC medications or home remedies attempted prior to arrival. No recent fever, chills, nausea, vomiting, chest pain, or shortness of breath. No known allergies to medications.  PCP: Default, Provider, MD    Past Medical History  Diagnosis Date  . Schizophrenia (HCC)   . Chronic knee pain    Past Surgical History  Procedure Laterality Date  . Cholecystectomy     No family history on file. Social History  Substance Use Topics  . Smoking status: Never Smoker   . Smokeless tobacco: None  . Alcohol Use: No   OB History    No data available     Review of Systems  Constitutional: Negative for fever and chills.  Respiratory: Negative for shortness of breath.   Cardiovascular: Negative for chest pain.  Gastrointestinal: Negative for nausea, vomiting and abdominal pain.  Musculoskeletal: Positive for myalgias, back pain and arthralgias.  Neurological: Negative for headaches.  All other systems reviewed and are negative.     Allergies  Review of patient's allergies indicates no  known allergies.  Home Medications   Prior to Admission medications   Medication Sig Start Date End Date Taking? Authorizing Provider  naproxen sodium (ANAPROX DS) 550 MG tablet Take 1 tablet (550 mg total) by mouth 2 (two) times daily with a meal. 03/01/16   [name], PA  omeprazole (PRILOSEC) 20 MG capsule Take 1 capsule (20 mg total) by mouth daily. 03/06/16   Lavera Guise, MD   Triage Vitals: BP 123/91 mmHg  Pulse 70  Temp(Src) 98.7 F (37.1 C) (Oral)  Resp 16  Ht  (1.676 m)  Wt 160 lb (72.576 kg)  BMI 25.84 kg/m2  SpO2 98%   Physical Exam  Constitutional: She is oriented to person, place, and time. She appears well-developed and well-nourished. No distress.  HENT:  Head: Normocephalic and atraumatic.  Right Ear: Hearing normal.  Left Ear: Hearing normal.  Nose: Nose normal.  Mouth/Throat: Oropharynx is clear and moist and mucous membranes are normal.  Eyes: Conjunctivae and EOM are normal. Pupils are equal, round, and reactive to light.  Neck: Normal range of motion. Neck supple.  Cardiovascular: Regular rhythm, S1 normal and S2 normal.  Exam reveals no gallop and no friction rub.   No murmur heard. Pulmonary/Chest: Effort normal and breath sounds normal. No respiratory distress. She exhibits no tenderness.  Abdominal: Soft. Normal appearance and bowel sounds are normal. There is no hepatosplenomegaly. There is no tenderness. There is no rebound, no guarding, no tenderness at McBurney's point and  negative Murphy's sign. No hernia.  Musculoskeletal: Normal range of motion. She exhibits tenderness.  Paraspinal tenderness noted  Neurological: She is alert and oriented to person, place, and time. She has normal strength. No cranial nerve deficit or sensory deficit. Coordination normal. GCS eye subscore is 4. GCS verbal subscore is 5. GCS motor subscore is 6.  Skin: Skin is warm, dry and intact. No rash noted. No cyanosis.  Psychiatric: She has a normal mood and affect.  Her speech is normal and behavior is normal. Thought content normal.  Nursing note and vitals reviewed.   ED Course  Procedures (including critical care time)  DIAGNOSTIC STUDIES: Oxygen Saturation is 98% on RA, Normal by my interpretation.    COORDINATION OF CARE: 1:06 AM-Discussed treatment plan with pt at bedside and pt agreed to plan.     Labs Review Labs Reviewed - No data to display  Imaging Review No results found. I have personally reviewed and evaluated these images and lab results as part of my medical decision-making.   EKG Interpretation None      MDM   Final diagnoses:  None  Low back pain  Patient presents to the emergency department for evaluation of low back pain and bilateral knee pain. Patient denies any direct injury. She has had surgery on her knees before. Examination reveals no evidence of trauma. There is no warmth or erythema to suggest infection. There is no appreciable joint effusion. Patient has normal range of motion.  Patient does have some mild low back tenderness in the soft tissue paraspinal region. I suspect that this is the cause of her pain. Patient does have had noted previous history of schizophrenia. She does have some delusions about snakes being in her apartment and possibly crawling into her body, however, she does not appear to be a danger to herself. She is not homicidal or suicidal. There is no reason for involuntary commitment. She will be referred to mental health for further evaluation. Return if her symptoms worsen.  I personally performed the services described in this documentation, which was scribed in my presence. The recorded information has been reviewed and is accurate.   Gilda Creasehristopher J Heith Haigler, MD 03/11/16 [phone]

## 2016-03-11 NOTE — Discharge Instructions (Signed)
Outpatient Psychiatry and Counseling  [facility]: Mobile Crisis Management 24 hours:  [phone]  [facility] sliding scale fee and walk in schedule: M-F 8am-12pm/1pm-3pm [address]  [place], [place] [address] [phone]  [facility] [address] [place], [place] [address] [phone]  [facility] ([facility])- new patient walk-in appointments available Monday - Friday 8am -3pm.          [address] [place], [place] [address] [phone] or crisis line- [phone]  [facility]/ Intensive Outpatient Therapy Program [address] [place], [place] [address] [phone]  [facility]                  Crisis Services      [phone]      [address]     [place], [place] [address]                 [facility]   [facility] [phone] [address] [place], [place] [address]   [facility]          [address],  [place], [place] [address]       [phone]  [facility] [address] [place], [place] [address] [phone]  [facility]    [address]    [place], [place] [address]     [phone]       Parish McKinney, MD     [address]     [place] [address]     [phone]       [facility] [address] [place] [address]  [facility]     [address]     [place], [place]      [phone]       [facility] Shamsher Ahluwalia, MD [address] [place], [place] [address] [phone]  [facility]     [address]     [place], [place] [address]     [phone]       [facility] [address] [place], [place] [address] [phone] Resources for Temporary  Residential Assistance/Crisis Centers  [facility] M-F 8am-3pm   [address] [place], [place] [address]   [phone] Services include: laundry, barbering, support groups, case management, phone  & computer access, showers, AA/NA mtgs, mental health/substance abuse nurse, job skills class, disability information, VA assistance, spiritual classes, etc.   HOMELESS SHELTERS  [facility]     [facility]   [address], [place]     [phone]              [facility] (women and children)       [address] [place], [place] [address] [phone] [email or web address] for application and process Application Required  [facility]   [address]    [place]  [address]     [phone]                    [facility] [address] [place],  [address] [phone](schedule application appt.) Application Required  [facility] (women only)    [address]     [place],  [address]       [phone]      Intake starts 6pm daily Need valid ID, SSC, & Police report [facility] [place] [address] [place], [place] [phone] Application Required  [facility] (men only)     [address]      [place], [place]     [phone]       [facility] (Pregnant women only) [address] [place], [place] [phone]  [facility]      [address]      [place], [place] [address]     [phone]             [facility] [facility] [address] [place], [place] [phone] 90 day commitment/SA/Application process  [facility](men only)     [address]     [place], [place]     [phone]       Check-in at 7pm            [facility] [address] [place], [place] [address] [phone] Men/Women/Women and Children  must be there by 7 pm  [facility] [place],  [phone]                 

## 2016-03-26 ENCOUNTER — Ambulatory Visit (INDEPENDENT_AMBULATORY_CARE_PROVIDER_SITE_OTHER): Payer: Medicaid Other

## 2016-03-26 ENCOUNTER — Encounter (HOSPITAL_COMMUNITY): Payer: Self-pay | Admitting: Nurse Practitioner

## 2016-03-26 ENCOUNTER — Ambulatory Visit (HOSPITAL_COMMUNITY)
Admission: EM | Admit: 2016-03-26 | Discharge: 2016-03-26 | Disposition: A | Discharge status: Home / Self Care | Payer: Medicaid Other | Attending: Emergency Medicine | Admitting: Emergency Medicine

## 2016-03-26 DIAGNOSIS — R1084 Generalized abdominal pain: Secondary | ICD-10-CM

## 2016-03-26 DIAGNOSIS — K5909 Other constipation: Secondary | ICD-10-CM

## 2016-03-26 LAB — POCT URINALYSIS DIP (DEVICE)
Bilirubin Urine: NEGATIVE
GLUCOSE, UA: NEGATIVE mg/dL
Hgb urine dipstick: NEGATIVE
KETONES UR: NEGATIVE mg/dL
LEUKOCYTES UA: NEGATIVE
Nitrite: NEGATIVE
Protein, ur: NEGATIVE mg/dL
Specific Gravity, Urine: 1.015 (ref 1.005–1.030)
Urobilinogen, UA: 0.2 mg/dL (ref 0.0–1.0)
pH: 7 (ref 5.0–8.0)

## 2016-03-26 LAB — POCT I-STAT, CHEM 8
BUN: 5 mg/dL — AB (ref 6–20)
CHLORIDE: 102 mmol/L (ref 101–111)
Calcium, Ion: 1.22 mmol/L (ref 1.12–1.23)
Creatinine, Ser: 0.7 mg/dL (ref 0.44–1.00)
GLUCOSE: 96 mg/dL (ref 65–99)
HCT: 43 % (ref 36.0–46.0)
Hemoglobin: 14.6 g/dL (ref 12.0–15.0)
POTASSIUM: 3.2 mmol/L — AB (ref 3.5–5.1)
SODIUM: 142 mmol/L (ref 135–145)
TCO2: 28 mmol/L (ref 0–100)

## 2016-03-26 NOTE — ED Provider Notes (Signed)
CSN: [id number]     Arrival date & time 03/26/16  1257 History   First MD Initiated Contact with Patient 03/26/16 1320     Chief Complaint  Patient presents with  . Knee Pain  . Abdominal Pain  . Eye Pain   (Consider location/radiation/quality/duration/timing/severity/associated sxs/prior Treatment) HPI Comments: 60 yo black female who carries a history of schizophrenia, chronic pain presents with abdominal pain. She reports ongoing for "months". She was seen somewhere and was given a shot which helped. She has not found a PCP as of yet. Her pain comes and goes. Mostly upper abdominal region. No GERD. Some mild constipation and positive for nausea, emesis, diarrhea and subjective fevers. She has no urinary symptoms. Meals do not seem to cause her pain. No alleviating factors. No blood in the stool, see remainder of review of symptoms. She also mentions pain all the time in her knees and burning in her eyes that she reports is allergies.   Patient is a 60 y.o. female presenting with knee pain, abdominal pain, and eye pain. The history is provided by the patient.  Knee Pain Associated symptoms: fever   Abdominal Pain Associated symptoms: constipation, diarrhea, fever, nausea and vomiting   Associated symptoms: no cough and no shortness of breath   Eye Pain Associated symptoms include abdominal pain. Pertinent negatives include no shortness of breath.    Past Medical History  Diagnosis Date  . Schizophrenia (HCC)   . Chronic knee pain    Past Surgical History  Procedure Laterality Date  . Cholecystectomy     History reviewed. No pertinent family history. Social History  Substance Use Topics  . Smoking status: Never Smoker   . Smokeless tobacco: None  . Alcohol Use: No   OB History    No data available     Review of Systems  Constitutional: Positive for fever. Negative for unexpected weight change.  HENT: Negative for sinus pressure and sneezing.   Eyes: Positive for pain.   Respiratory: Negative for cough and shortness of breath.   Gastrointestinal: Positive for nausea, vomiting, abdominal pain, diarrhea and constipation. Negative for blood in stool, abdominal distention and anal bleeding.  Genitourinary: Negative.   Musculoskeletal: Positive for arthralgias.       Chronic history of knee pain  Skin: Negative.   Allergic/Immunologic: Positive for environmental allergies.  Psychiatric/Behavioral: Negative.     Allergies  Review of patient's allergies indicates no known allergies.  Home Medications   Prior to Admission medications   Medication Sig Start Date End Date Taking? Authorizing Provider  naproxen sodium (ANAPROX DS) 550 MG tablet Take 1 tablet (550 mg total) by mouth 2 (two) times daily with a meal. 03/01/16   [name], PA  omeprazole (PRILOSEC) 20 MG capsule Take 1 capsule (20 mg total) by mouth daily. Patient not taking: Reported on 03/11/2016 03/06/16   Lavera Guise, MD   Meds Ordered and Administered this Visit  Medications - No data to display  BP 156/89 mmHg  Pulse 70  Temp(Src) 98.2 F (36.8 C) (Oral)  Resp 16  SpO2 98% No data found.   Physical Exam  Constitutional: She is oriented to person, place, and time. She appears well-developed and well-nourished. No distress.  Patient sitting on exam table in no acute distress.   Eyes: Pupils are equal, round, and reactive to light. Right eye exhibits no discharge. Left eye exhibits no discharge. No scleral icterus.  Neck: Normal range of motion. Neck supple.  Pulmonary/Chest:  Effort normal and breath sounds normal.  Abdominal: Soft. She exhibits no distension. There is tenderness. There is no rebound and no guarding.  All areas of the abdominal regions tender to palpation  Neurological: She is alert and oriented to person, place, and time.  Skin: Skin is warm and dry. She is not diaphoretic.  Psychiatric: Her behavior is normal.  Nursing note and vitals reviewed.   ED Course   Procedures (including critical care time)  Labs Review Labs Reviewed  POCT I-STAT, CHEM 8 - Abnormal; Notable for the following:    Potassium 3.2 (*)    BUN 5 (*)    All other components within normal limits  POCT URINALYSIS DIP (DEVICE)    Imaging Review Dg Abd 1 View  03/26/2016  CLINICAL DATA:  On going abdominal pain. EXAM: ABDOMEN - 1 VIEW COMPARISON:  None. FINDINGS: Multiple pelvic calcifications appear venous type. No suspected urolithiasis. Normal bowel gas pattern with no abnormal stool retention. Cholecystectomy clips. No concerning intra-abdominal mass effect. Lower lumbar facet arthropathy and disc narrowing. IMPRESSION: No evidence of active disease. Electronically Signed   By: Marnee SpringJonathon  Watts M.D.   On: 03/26/2016 13:57     Visual Acuity Review  Right Eye Distance:   Left Eye Distance:   Bilateral Distance:    Right Eye Near:   Left Eye Near:    Bilateral Near:         MDM   1. Generalized abdominal pain   2. Other constipation    Etiology unclear. Suspect constipation though she is aware that if this worsen go to the ED for further work up. If continues then please establish care with a PCP so this can be worked up properly. Findings here are negative.     Riki SheerMichelle G Charlesa Ehle, PA-C 03/26/16 1444

## 2016-03-26 NOTE — Discharge Instructions (Signed)
The cause of your abdominal pain is unclear. Your exam suggest a non-emergent finding today which is good. Your labs and urine look ok. Your xray show some residual stool. Suggest use of colace (OTC) daily with increased water and fiber intake to see if this is helpful. Suggest you establish care with a primary care doctor that can coordinate your care especially if the pain continues. Certainly if you worsen then go to the ER for further work up.   Constipation, Adult Constipation is when a person:  Poops (has a bowel movement) less than 3 times a week.  Has a hard time pooping.  Has poop that is dry, hard, or bigger than normal. HOME CARE   Eat foods with a lot of fiber in them. This includes fruits, vegetables, beans, and whole grains such as brown rice.  Avoid fatty foods and foods with a lot of sugar. This includes french fries, hamburgers, cookies, candy, and soda.  If you are not getting enough fiber from food, take products with added fiber in them (supplements).  Drink enough fluid to keep your pee (urine) clear or pale yellow.  Exercise on a regular basis, or as told by your doctor.  Go to the restroom when you feel like you need to poop. Do not hold it.  Only take medicine as told by your doctor. Do not take medicines that help you poop (laxatives) without talking to your doctor first. GET HELP RIGHT AWAY IF:   You have bright red blood in your poop (stool).  Your constipation lasts more than 4 days or gets worse.  You have belly (abdominal) or butt (rectal) pain.  You have thin poop (as thin as a pencil).  You lose weight, and it cannot be explained. MAKE SURE YOU:   Understand these instructions.  Will watch your condition.  Will get help right away if you are not doing well or get worse.   This information is not intended to replace advice given to you by your health care provider. Make sure you discuss any questions you have with your health care  provider.   Document Released: 04/19/2008 Document Revised: 11/22/2014 Document Reviewed: 08/13/2013 Elsevier Interactive Patient Education 2016 Elsevier Inc.  Abdominal Pain, Adult Many things can cause abdominal pain. Usually, abdominal pain is not caused by a disease and will improve without treatment. It can often be observed and treated at home. Your health care provider will do a physical exam and possibly order blood tests and X-rays to help determine the seriousness of your pain. However, in many cases, more time must pass before a clear cause of the pain can be found. Before that point, your health care provider may not know if you need more testing or further treatment. HOME CARE INSTRUCTIONS Monitor your abdominal pain for any changes. The following actions may help to alleviate any discomfort you are experiencing:  Only take over-the-counter or prescription medicines as directed by your health care provider.  Do not take laxatives unless directed to do so by your health care provider.  Try a clear liquid diet (broth, tea, or water) as directed by your health care provider. Slowly move to a bland diet as tolerated. SEEK MEDICAL CARE IF:  You have unexplained abdominal pain.  You have abdominal pain associated with nausea or diarrhea.  You have pain when you urinate or have a bowel movement.  You experience abdominal pain that wakes you in the night.  You have abdominal pain that is worsened  or improved by eating food.  You have abdominal pain that is worsened with eating fatty foods.  You have a fever. SEEK IMMEDIATE MEDICAL CARE IF:  Your pain does not go away within 2 hours.  You keep throwing up (vomiting).  Your pain is felt only in portions of the abdomen, such as the right side or the left lower portion of the abdomen.  You pass bloody or black tarry stools. MAKE SURE YOU:  Understand these instructions.  Will watch your condition.  Will get help right  away if you are not doing well or get worse.   This information is not intended to replace advice given to you by your health care provider. Make sure you discuss any questions you have with your health care provider.   Document Released: 08/11/2005 Document Revised: 07/23/2015 Document Reviewed: 07/11/2013 Elsevier Interactive Patient Education [employer] Elsevier Inc.

## 2016-03-26 NOTE — ED Notes (Signed)
Pt states she is here for multiple complaints. She reports has had bilateral knee cap pain since she had knee surgery. She c/o 1 week history of abd pain with some diarrhea and fevers. She c/o 1 week history of burning eyes. She says shes allergic to all of the medications she has been given for these complaints. She is alert and breathing easily

## 2016-04-03 DIAGNOSIS — R0789 Other chest pain: Secondary | ICD-10-CM | POA: Diagnosis not present

## 2016-04-03 DIAGNOSIS — G8929 Other chronic pain: Secondary | ICD-10-CM

## 2016-04-03 DIAGNOSIS — R079 Chest pain, unspecified: Secondary | ICD-10-CM | POA: Diagnosis present

## 2016-04-03 DIAGNOSIS — M546 Pain in thoracic spine: Secondary | ICD-10-CM | POA: Diagnosis not present

## 2016-04-03 DIAGNOSIS — M545 Low back pain: Secondary | ICD-10-CM | POA: Diagnosis not present

## 2016-04-03 DIAGNOSIS — M549 Dorsalgia, unspecified: Secondary | ICD-10-CM | POA: Insufficient documentation

## 2016-04-04 ENCOUNTER — Encounter (HOSPITAL_COMMUNITY): Payer: Self-pay

## 2016-04-04 ENCOUNTER — Emergency Department (HOSPITAL_COMMUNITY)
Admission: EM | Admit: 2016-04-04 | Discharge: 2016-04-04 | Disposition: A | Discharge status: Home / Self Care | Payer: Medicaid Other | Source: Home / Self Care

## 2016-04-04 ENCOUNTER — Emergency Department (HOSPITAL_COMMUNITY)
Admission: EM | Admit: 2016-04-04 | Discharge: 2016-04-04 | Disposition: A | Discharge status: Home / Self Care | Payer: Medicaid Other | Attending: Emergency Medicine | Admitting: Emergency Medicine

## 2016-04-04 DIAGNOSIS — M546 Pain in thoracic spine: Secondary | ICD-10-CM | POA: Insufficient documentation

## 2016-04-04 DIAGNOSIS — M545 Low back pain: Secondary | ICD-10-CM | POA: Insufficient documentation

## 2016-04-04 DIAGNOSIS — R0789 Other chest pain: Secondary | ICD-10-CM | POA: Insufficient documentation

## 2016-04-04 LAB — COMPREHENSIVE METABOLIC PANEL
ALK PHOS: 90 U/L (ref 38–126)
ALT: 20 U/L (ref 14–54)
AST: 20 U/L (ref 15–41)
Albumin: 3.6 g/dL (ref 3.5–5.0)
Anion gap: 7 (ref 5–15)
CALCIUM: 9 mg/dL (ref 8.9–10.3)
CHLORIDE: 105 mmol/L (ref 101–111)
CO2: 28 mmol/L (ref 22–32)
CREATININE: 0.71 mg/dL (ref 0.44–1.00)
GFR calc Af Amer: >60 mL/min (ref >60)
GFR calc non Af Amer: >60 mL/min (ref >60)
GLUCOSE: 107 mg/dL — AB (ref 65–99)
Potassium: 3.1 mmol/L — ABNORMAL LOW (ref 3.5–5.1)
SODIUM: 140 mmol/L (ref 135–145)
Total Bilirubin: 0.7 mg/dL (ref 0.3–1.2)
Total Protein: 7.2 g/dL (ref 6.5–8.1)

## 2016-04-04 LAB — I-STAT TROPONIN, ED: Troponin i, poc: 0 ng/mL (ref 0.00–0.08)

## 2016-04-04 LAB — CBC WITH DIFFERENTIAL/PLATELET
BASOS ABS: 0 10*3/uL (ref 0.0–0.1)
Basophils Relative: 0 %
EOS ABS: 0.1 10*3/uL (ref 0.0–0.7)
EOS PCT: 1 %
HCT: 40.6 % (ref 36.0–46.0)
HEMOGLOBIN: 12.4 g/dL (ref 12.0–15.0)
LYMPHS PCT: 16 %
Lymphs Abs: 1.6 10*3/uL (ref 0.7–4.0)
MCH: 25.2 pg — ABNORMAL LOW (ref 26.0–34.0)
MCHC: 30.5 g/dL (ref 30.0–36.0)
MCV: 82.5 fL (ref 78.0–100.0)
Monocytes Absolute: 0.8 10*3/uL (ref 0.1–1.0)
Monocytes Relative: 8 %
NEUTROS PCT: 75 %
Neutro Abs: 7.4 10*3/uL (ref 1.7–7.7)
PLATELETS: 290 10*3/uL (ref 150–400)
RBC: 4.92 MIL/uL (ref 3.87–5.11)
RDW: 14.1 % (ref 11.5–15.5)
WBC: 10 10*3/uL (ref 4.0–10.5)

## 2016-04-04 LAB — LIPASE, BLOOD: Lipase: 31 U/L (ref 11–51)

## 2016-04-04 NOTE — ED Notes (Signed)
Pt reports seeing her PCP last week and was treated for constipation . Pt is now taking colace for constipation . Pt reports she is gassy.

## 2016-04-04 NOTE — ED Notes (Signed)
Pt from home, pt complains of low and high back pain. [facility] Pt states that she has had normal bowel movements since but the back pain persists. Pt ambulatory

## 2016-04-04 NOTE — ED Provider Notes (Signed)
MSE was initiated and I personally evaluated the patient and placed orders (if any) at  8:09 AM on Apr 04, 2016.  The patient appears stable so that the remainder of the MSE may be completed by another provider.  60 year old female here with multiple complaints. She reports she has pain in her upper and lower back. She also reports difficulty with bowel movements. She was seen at urgent care for this last week started on Colace but reports this does not seem to be helping all that much.  She states she has been eating and drinking normally, but she has a "rolling around sensation" in her abdomen and pain when you touch her abdomen. She also reports a "funny feeling" in her chest, worse when she moves. She states she feels mildly short of breath. She denies any known cardiac history.  Patient was multiple complaints that will require workup. Labs, EKG ordered. She will be moved to acute side of the ED.  Garlon HatchetLisa M Luiza Carranco, PA-C 04/04/16 [id number]  Gerhard Munchobert Lockwood, MD 04/04/16 1051

## 2016-04-04 NOTE — ED Notes (Signed)
Patient here with complaint of generalized back pain with movement x 2 weeks. Was here last night and left prior to being evaluated. Reports that on her return her back is better but experiencing bilateral breast pain with movement

## 2016-04-04 NOTE — Discharge Instructions (Signed)
As discussed, your evaluation today has been largely reassuring.  But, it is important that you monitor your condition carefully, and do not hesitate to return to the ED if you develop new, or concerning changes in your condition. ? ?Otherwise, please follow-up with your physician for appropriate ongoing care. ? ?

## 2016-04-04 NOTE — ED Notes (Signed)
Pt called for room A13, no answer in waiting room. Pt moved off the floor.

## 2016-04-04 NOTE — ED Provider Notes (Signed)
CSN: [id number]     Arrival date & time 04/04/16  [id number] History   First MD Initiated Contact with Patient 04/04/16 0750     Chief Complaint  Patient presents with  . Back Pain  . Chest Pain  . Shortness of Breath     HPI  Patient presents with multiple concerns. It seems as though the patient typically has some amount of pain in her thorax, migrating between her breasts, back, abdomen This is unchanged, over the past 3 months until the past 24 hours, when she began to feel more sharp severe exacerbations.  No new fever, chills, nausea, vomiting. No urinary complaints. No relief with anything. Patient states that she has seen multiple practitioners for her concerns, with no clear diagnosis. Patient states the last time she was in the emergency department she was concerned about having snakes inside of her. (No snakes were found inside the patient.)   Past Medical History  Diagnosis Date  . Schizophrenia (HCC)   . Chronic knee pain    Past Surgical History  Procedure Laterality Date  . Cholecystectomy     No family history on file. Social History  Substance Use Topics  . Smoking status: Never Smoker   . Smokeless tobacco: None  . Alcohol Use: No   OB History    No data available     Review of Systems  Constitutional:       Per HPI, otherwise negative  HENT:       Per HPI, otherwise negative  Respiratory:       Per HPI, otherwise negative  Cardiovascular:       Per HPI, otherwise negative  Gastrointestinal: Negative for vomiting.  Endocrine:       Negative aside from HPI  Genitourinary:       Neg aside from HPI   Musculoskeletal:       Per HPI, otherwise negative  Skin: Negative.   Neurological: Negative for syncope.  Psychiatric/Behavioral: The patient is nervous/anxious.       Allergies  Review of patient's allergies indicates no known allergies.  Home Medications   Prior to Admission medications   Medication Sig Start Date End Date Taking?  Authorizing Provider  Acetaminophen (TYLENOL) 325 MG CAPS Take 325 mg by mouth 2 (two) times daily as needed (pain).   Yes Historical Provider, MD  CALCIUM PO Take 1 tablet by mouth once.   Yes Historical Provider, MD  naproxen sodium (ANAPROX DS) 550 MG tablet Take 1 tablet (550 mg total) by mouth 2 (two) times daily with a meal. Patient not taking: Reported on 04/04/2016 03/01/16   Tharon AquasFrank C Patrick, PA  omeprazole (PRILOSEC) 20 MG capsule Take 1 capsule (20 mg total) by mouth daily. Patient not taking: Reported on 03/11/2016 03/06/16   Lavera Guiseana Duo Liu, MD   BP 149/75 mmHg  Pulse 57  Temp(Src) 98.1 F (36.7 C) (Oral)  Resp 15  Wt 163 lb 3.2 oz (74.027 kg)  SpO2 100% Physical Exam  Constitutional: She is oriented to person, place, and time. She appears well-developed and well-nourished. No distress.  HENT:  Head: Normocephalic and atraumatic.  Eyes: Conjunctivae and EOM are normal.  Cardiovascular: Normal rate and regular rhythm.   Pulmonary/Chest: Effort normal and breath sounds normal. No stridor. No respiratory distress.  Abdominal: She exhibits no distension. There is no tenderness.  Musculoskeletal: She exhibits no edema.  Neurological: She is alert and oriented to person, place, and time. No cranial nerve deficit.  Skin: Skin  is warm and dry.  Psychiatric: Her mood appears anxious.  Patient has limited insight into her presentation  Nursing note and vitals reviewed.   ED Course  Procedures (including critical care time) Labs Review Labs Reviewed  CBC WITH DIFFERENTIAL/PLATELET - Abnormal; Notable for the following:    MCH 25.2 (*)    All other components within normal limits  COMPREHENSIVE METABOLIC PANEL - Abnormal; Notable for the following:    Potassium 3.1 (*)    Glucose, Bld 107 (*)    BUN <5 (*)    All other components within normal limits  LIPASE, BLOOD  I-STAT TROPOININ, ED       EKG Interpretation   Date/Time:  Sunday Apr 04 2016 08:28:51 EDT Ventricular  Rate:  68 PR Interval:  143 QRS Duration: 92 QT Interval:  446 QTC Calculation: 474 R Axis:   26 Text Interpretation:  Sinus rhythm Abnormal R-wave progression, early  transition Baseline wander in lead(s) V6 Sinus rhythm T wave abnormality  No significant change since last tracing Abnormal ekg Confirmed by  Gerhard Munch  MD [phone]) on 04/04/2016 8:36:57 AM     Chart review notable for a 15 emergency department visits in 6 months, largely unremarkable findings MDM  Patient presents with multiple complaints. Here she is awake, alert, afebrile, hemodynamically stable. Patient's evaluation is reassuring, consistent with multiple prior studies there No evidence for acute new pathology, including ACS, infection, pulmonary embolism. Patient discharged in stable condition to follow-up with primary care.  Gerhard Munch, MD 04/04/16 [phone]

## 2016-04-04 NOTE — ED Notes (Signed)
Patient reports that she left at 0400 and returned at 630am to be seen. Explained that since she left she would have to start the process over.

## 2016-04-10 ENCOUNTER — Encounter (HOSPITAL_COMMUNITY): Payer: Self-pay

## 2016-04-10 ENCOUNTER — Emergency Department (HOSPITAL_COMMUNITY)
Admission: EM | Admit: 2016-04-10 | Discharge: 2016-04-10 | Disposition: A | Discharge status: Home / Self Care | Payer: Medicaid Other | Attending: Emergency Medicine | Admitting: Emergency Medicine

## 2016-04-10 DIAGNOSIS — Z79899 Other long term (current) drug therapy: Secondary | ICD-10-CM | POA: Insufficient documentation

## 2016-04-10 DIAGNOSIS — F209 Schizophrenia, unspecified: Secondary | ICD-10-CM | POA: Diagnosis not present

## 2016-04-10 DIAGNOSIS — Z9049 Acquired absence of other specified parts of digestive tract: Secondary | ICD-10-CM | POA: Insufficient documentation

## 2016-04-10 DIAGNOSIS — R202 Paresthesia of skin: Secondary | ICD-10-CM | POA: Insufficient documentation

## 2016-04-10 LAB — COMPREHENSIVE METABOLIC PANEL
ALT: 16 U/L (ref 14–54)
AST: 19 U/L (ref 15–41)
Albumin: 3.4 g/dL — ABNORMAL LOW (ref 3.5–5.0)
Alkaline Phosphatase: 85 U/L (ref 38–126)
Anion gap: 7 (ref 5–15)
BILIRUBIN TOTAL: 0.8 mg/dL (ref 0.3–1.2)
BUN: 8 mg/dL (ref 6–20)
CHLORIDE: 104 mmol/L (ref 101–111)
CO2: 26 mmol/L (ref 22–32)
CREATININE: 0.69 mg/dL (ref 0.44–1.00)
Calcium: 9 mg/dL (ref 8.9–10.3)
Glucose, Bld: 90 mg/dL (ref 65–99)
POTASSIUM: 3.3 mmol/L — AB (ref 3.5–5.1)
Sodium: 137 mmol/L (ref 135–145)
TOTAL PROTEIN: 6.9 g/dL (ref 6.5–8.1)

## 2016-04-10 LAB — CBC WITH DIFFERENTIAL/PLATELET
BASOS ABS: 0 10*3/uL (ref 0.0–0.1)
Basophils Relative: 0 %
EOS ABS: 0.2 10*3/uL (ref 0.0–0.7)
Eosinophils Relative: 2 %
HCT: 39.2 % (ref 36.0–46.0)
HEMOGLOBIN: 12.2 g/dL (ref 12.0–15.0)
LYMPHS ABS: 1.9 10*3/uL (ref 0.7–4.0)
Lymphocytes Relative: 23 %
MCH: 25.5 pg — AB (ref 26.0–34.0)
MCHC: 31.1 g/dL (ref 30.0–36.0)
MCV: 81.8 fL (ref 78.0–100.0)
Monocytes Absolute: 0.7 10*3/uL (ref 0.1–1.0)
Monocytes Relative: 8 %
NEUTROS PCT: 67 %
Neutro Abs: 5.8 10*3/uL (ref 1.7–7.7)
PLATELETS: 282 10*3/uL (ref 150–400)
RBC: 4.79 MIL/uL (ref 3.87–5.11)
RDW: 13.9 % (ref 11.5–15.5)
WBC: 8.6 10*3/uL (ref 4.0–10.5)

## 2016-04-10 MED ORDER — HYDROXYZINE HCL 25 MG PO TABS: 25.0000 mg | ORAL_TABLET | Freq: Four times a day (QID) | ORAL | Status: DC

## 2016-04-10 MED ORDER — HYDROXYZINE HCL 25 MG PO TABS
25.0000 mg | ORAL_TABLET | Freq: Once | ORAL | Status: AC
  Administered 2016-04-10: 25 mg via ORAL
  Filled 2016-04-10: qty 1

## 2016-04-10 NOTE — Discharge Instructions (Signed)
You have been seen today for a feeling of bugs crawling under the skin. Your lab tests showed no abnormalities. Follow-up with your psychiatrist as soon as possible on this issue. The sensation you are feeling may be due to your injected medication.

## 2016-04-10 NOTE — ED Provider Notes (Signed)
CSN: [id number]     Arrival date & time 04/10/16  [id number] History   First MD Initiated Contact with Patient 04/10/16 0445     Chief Complaint  Patient presents with  . Tingling     (Consider location/radiation/quality/duration/timing/severity/associated sxs/prior Treatment) HPI   Felicia LatheSheila Wechter is a 60 y.o. female, with a history of schizophrenia, presenting to the ED with a sensation of "crawly things" all over her body for the last two months. Pt states this has been a recurrent problem. Pt states, "It's something crawling inside my skin. Everytime I lay in bed, the bugs get inside me." Pt states she has been to see her PCP for this problem previously and was eventually told she could no longer come to the practice. Pt initially states she does not take any medications regularly, but then states she gets a monthly injection "of water or something." Pt denies drug or alcohol use. States that family members who live with her do not have the same symptoms. Denies N/V, weakness, numbness, chest pain, shortness of breath, or any other complaints. Pt has not spent time in the woods and has not started any new medications.    Past Medical History  Diagnosis Date  . Schizophrenia (HCC)   . Chronic knee pain    Past Surgical History  Procedure Laterality Date  . Cholecystectomy     No family history on file. Social History  Substance Use Topics  . Smoking status: Never Smoker   . Smokeless tobacco: None  . Alcohol Use: No   OB History    No data available     Review of Systems  Constitutional: Negative for fever and chills.  Respiratory: Negative for shortness of breath.   Cardiovascular: Negative for chest pain.  Gastrointestinal: Negative for nausea and vomiting.  Skin: Negative for color change, pallor and rash.  Neurological:       "Bugs crawling under the skin."  All other systems reviewed and are negative.     Allergies  Review of patient's allergies indicates no known  allergies.  Home Medications   Prior to Admission medications   Medication Sig Start Date End Date Taking? Authorizing Provider  Acetaminophen (TYLENOL) 325 MG CAPS Take 325 mg by mouth 2 (two) times daily as needed (pain).   Yes Historical Provider, MD   BP 151/85 mmHg  Pulse 53  Temp(Src) 97.7 F (36.5 C) (Oral)  Resp 16  SpO2 100% Physical Exam  Constitutional: She is oriented to person, place, and time. She appears well-developed and well-nourished. No distress.  HENT:  Head: Normocephalic and atraumatic.  Eyes: Conjunctivae are normal. Pupils are equal, round, and reactive to light.  Neck: Neck supple.  Cardiovascular: Normal rate, regular rhythm, normal heart sounds and intact distal pulses.   Pulmonary/Chest: Effort normal and breath sounds normal. No respiratory distress.  Abdominal: Soft. There is no tenderness. There is no guarding.  Musculoskeletal: She exhibits no edema or tenderness.  Full ROM in all extremities and spine. No paraspinal tenderness.   Lymphadenopathy:    She has no cervical adenopathy.  Neurological: She is alert and oriented to person, place, and time. She has normal reflexes.  No sensory deficits. Strength 5/5 in all extremities. No gait disturbance. Coordination intact. Cranial nerves III-XII grossly intact. No facial droop.   Skin: Skin is warm and dry. No rash noted. She is not diaphoretic.  Psychiatric: She has a normal mood and affect. Her behavior is normal.  Pt fidgets throughout the interview,  occasionally scratching her skin.   Nursing note and vitals reviewed.   ED Course  Procedures (including critical care time) Labs Review Labs Reviewed  CBC WITH DIFFERENTIAL/PLATELET - Abnormal; Notable for the following:    MCH 25.5 (*)    All other components within normal limits  COMPREHENSIVE METABOLIC PANEL    Imaging Review No results found. I have personally reviewed and evaluated these lab results as part of my medical  decision-making.   EKG Interpretation   Date/Time:  Saturday Apr 10 2016 03:23:57 EDT Ventricular Rate:  61 PR Interval:  138 QRS Duration: 86 QT Interval:  448 QTC Calculation: 450 R Axis:   30 Text Interpretation:  Normal sinus rhythm with sinus arrhythmia Normal ECG  Nonspecific ST and T wave abnormality No significant change since last  tracing Confirmed by Rhunette Croft, MD, Janey Genta [phone]) on 04/10/2016 5:24:07 AM      MDM   Final diagnoses:  Formication    Felicia Collier complains of a sensation of bugs crawling under her skin for the last 2 months.  Suspect patient may be having her symptoms as a side effect to her antipsychotic injectable. Patient does not have the appearance of someone experiencing withdrawal from a substance.  Labs to assure no evidence of kidney dysfunction. Normal neuro exam. Patient is nontoxic appearing, afebrile, not tachycardic, not tachypneic, maintains SPO2 of 100% on room air, and is in no apparent distress. Patient has no signs of sepsis or other serious or life-threatening condition. Patient to follow-up with her psychiatrist as soon as possible on this matter. Return precautions discussed. Patient voiced understanding of these instructions and is comfortable with discharge.   Filed Vitals:   04/10/16 0320 04/10/16 0437 04/10/16 0445  BP: 113/83 129/68 151/75  Pulse: 59  48  Temp: 97.7 F (36.5 C)    TempSrc: Oral    Resp: 16    SpO2: 97%  100%    Filed Vitals:   04/10/16 0600 04/10/16 0615 04/10/16 0630 04/10/16 0645  BP: 130/65 151/85 147/67 143/66  Pulse: 53 53 58 50  Temp:      TempSrc:      Resp:      SpO2: 98% 100% 100% 100%      Anselm Pancoast, PA-C 04/10/16 0701  Derwood Kaplan, MD 04/10/16 2304

## 2016-04-10 NOTE — ED Notes (Signed)
Pt reports she feels a tingling sensation throughout her entire body, onset about 2 months ago and states it happens every night. SShe describes the sensation "it feels like worms are crawling all over me." She states she has been here twice for same but is not sure what is going on. She denies chest pain. Pt speaking clear complete sentences, ambulatory with steady gait, NAD. No abnormal neuro symptoms.

## 2016-04-14 ENCOUNTER — Emergency Department (HOSPITAL_COMMUNITY)
Admission: EM | Admit: 2016-04-14 | Discharge: 2016-04-14 | Disposition: A | Discharge status: Home / Self Care | Payer: Medicaid Other | Attending: Emergency Medicine | Admitting: Emergency Medicine

## 2016-04-14 ENCOUNTER — Encounter (HOSPITAL_COMMUNITY): Payer: Self-pay | Admitting: *Deleted

## 2016-04-14 DIAGNOSIS — M79605 Pain in left leg: Secondary | ICD-10-CM | POA: Insufficient documentation

## 2016-04-14 DIAGNOSIS — Z8659 Personal history of other mental and behavioral disorders: Secondary | ICD-10-CM | POA: Diagnosis not present

## 2016-04-14 DIAGNOSIS — Z79899 Other long term (current) drug therapy: Secondary | ICD-10-CM | POA: Insufficient documentation

## 2016-04-14 DIAGNOSIS — R202 Paresthesia of skin: Secondary | ICD-10-CM | POA: Diagnosis not present

## 2016-04-14 DIAGNOSIS — G8929 Other chronic pain: Secondary | ICD-10-CM | POA: Diagnosis not present

## 2016-04-14 DIAGNOSIS — M79604 Pain in right leg: Secondary | ICD-10-CM | POA: Insufficient documentation

## 2016-04-14 DIAGNOSIS — M549 Dorsalgia, unspecified: Secondary | ICD-10-CM | POA: Diagnosis present

## 2016-04-14 MED ORDER — CETIRIZINE HCL 10 MG PO TABS: 10.0000 mg | ORAL_TABLET | Freq: Every day | ORAL | Status: DC

## 2016-04-14 NOTE — ED Notes (Signed)
Patient states she has been feeling like something is stinging her in the back, legs and arms and has been going on for a couple of months.  Has been here for the same several times.  States she cannot find a doctor so she comes here

## 2016-04-14 NOTE — Discharge Instructions (Signed)
Please take your medications as prescribed. Follow-up with your doctor for your regularly scheduled appointment. Return to ED for any new or worsening symptoms.

## 2016-04-14 NOTE — ED Provider Notes (Signed)
CSN: [id number]     Arrival date & time 04/14/16  2107 History  By signing my name below, I, Felicia Collier, attest that this documentation has been prepared under the direction and in the presence of [employer] MillsBenjamin Rivky Clendenning, PA-C. Electronically Signed: Randell PatientMarrissa Collier, ED Scribe. 04/14/2016. 9:43 PM.   Chief Complaint  Collier presents with  . Back and legs stinging    The history is provided by the Collier. No language interpreter was used.  HPI Comments: Felicia Collier is a 60 y.o. female with an hx of chronic knee pain and schizophrenia who presents to the Emergency Department complaining of intermittent, generalized stinging pain to her back, BUE, BLE, and eyes ongoing for the past few months. Pt describes the symptoms as itching and "like something in crawling" on her skin. She has been taking her hydroxyzine without relief. She has been seen by her psychologist in the past month who told the pt that she was healthy. Denies taking anti-psychosis medications or any other medications recently. Denies bite marks from insects. Denies rash or any other symptoms currently.No other modifying factors   Past Medical History  Diagnosis Date  . Schizophrenia (HCC)   . Chronic knee pain    Past Surgical History  Procedure Laterality Date  . Cholecystectomy     No family history on file. Social History  Substance Use Topics  . Smoking status: Never Smoker   . Smokeless tobacco: None  . Alcohol Use: No   OB History    No data available     Review of Systems  Musculoskeletal: Positive for myalgias and back pain.  Skin: Negative for rash.  All other systems reviewed and are negative.     Allergies  Review of Collier's allergies indicates no known allergies.  Home Medications   Prior to Admission medications   Medication Sig Start Date End Date Taking? Authorizing Provider  Acetaminophen (TYLENOL) 325 MG CAPS Take 325 mg by mouth 2 (two) times daily as needed (pain).    Historical  Provider, MD  cetirizine (ZYRTEC ALLERGY) 10 MG tablet Take 1 tablet (10 mg total) by mouth daily. 04/14/16   Joycie PeekBenjamin Salli Bodin, PA-C  hydrOXYzine (ATARAX/VISTARIL) 25 MG tablet Take 1 tablet (25 mg total) by mouth every 6 (six) hours. 04/10/16   Shawn C Joy, PA-C   BP 142/75 mmHg  Pulse 65  Temp(Src) 98.7 F (37.1 C) (Oral)  Resp 18  Ht 5\' 2"  (1.575 m)  Wt 163 lb (73.936 kg)  BMI 29.81 kg/m2  SpO2 98% Physical Exam  Constitutional: She is oriented to person, place, and time. She appears well-developed and well-nourished. No distress.  HENT:  Head: Normocephalic and atraumatic.  Mouth/Throat: Oropharynx is clear and moist.  Eyes: Conjunctivae are normal. Pupils are equal, round, and reactive to light. Right eye exhibits no discharge. Left eye exhibits no discharge. No scleral icterus.  Neck: Normal range of motion. Neck supple.  Cardiovascular: Normal rate.   Pulmonary/Chest: Effort normal and breath sounds normal. No respiratory distress. She has no wheezes. She has no rales.  Abdominal: Soft. There is no tenderness.  Musculoskeletal: Normal range of motion. She exhibits no tenderness.  Neurological: She is alert and oriented to person, place, and time.  Cranial Nerves II-XII grossly intact  Skin: Skin is warm and dry. No rash noted.  Psychiatric: She has a normal mood and affect. Her behavior is normal.  Nursing note and vitals reviewed.   ED Course  Procedures   DIAGNOSTIC STUDIES: Oxygen Saturation is  98% on RA, normal by my interpretation.    COORDINATION OF CARE: 9:37 PM Will prescribe cetirizine. Will discharge pt. Discussed treatment plan with pt at bedside and pt agreed to plan.   MDM  Collier with multiple complaints, mostly concerned of tingling sensation all over her back and legs and arm. This seems chronic for her and she has been evaluated for this problem multiple times. Has a follow-up appointment with her PCP on June 22. Discussed would try Zyrtec to help with  tingling and itching sensation. No evidence of other acute or emergent pathology. No evidence of emergent substance withdrawal. Collier overall appears well, nontoxic, hemodynamically stable, afebrile and appropriate for discharge. Final diagnoses:  Tingling sensation    I personally performed the services described in this documentation, which was scribed in my presence. The recorded information has been reviewed and is accurate.    Joycie Peek, PA-C 04/14/16 2159  Jacalyn Lefevre, MD 04/14/16 [phone]

## 2016-04-14 NOTE — ED Notes (Signed)
Patient ambulatory from the bridge brought in by EMS

## 2016-04-18 ENCOUNTER — Emergency Department (HOSPITAL_COMMUNITY)
Admission: EM | Admit: 2016-04-18 | Discharge: 2016-04-18 | Disposition: A | Discharge status: Home / Self Care | Payer: Medicaid Other | Attending: Emergency Medicine | Admitting: Emergency Medicine

## 2016-04-18 ENCOUNTER — Encounter (HOSPITAL_COMMUNITY): Payer: Self-pay | Admitting: Emergency Medicine

## 2016-04-18 DIAGNOSIS — L988 Other specified disorders of the skin and subcutaneous tissue: Secondary | ICD-10-CM | POA: Diagnosis present

## 2016-04-18 DIAGNOSIS — Z79899 Other long term (current) drug therapy: Secondary | ICD-10-CM | POA: Diagnosis not present

## 2016-04-18 DIAGNOSIS — G8929 Other chronic pain: Secondary | ICD-10-CM | POA: Insufficient documentation

## 2016-04-18 DIAGNOSIS — Z8659 Personal history of other mental and behavioral disorders: Secondary | ICD-10-CM | POA: Insufficient documentation

## 2016-04-18 DIAGNOSIS — R202 Paresthesia of skin: Secondary | ICD-10-CM | POA: Diagnosis not present

## 2016-04-18 MED ORDER — GABAPENTIN 300 MG PO CAPS: 300.0000 mg | ORAL_CAPSULE | Freq: Three times a day (TID) | ORAL | Status: DC

## 2016-04-18 NOTE — Discharge Instructions (Signed)
Paresthesia Paresthesia is an abnormal burning or prickling sensation. This sensation is generally felt in the hands, arms, legs, or feet. However, it may occur in any part of the body. Usually, it is not painful. The feeling may be described as:  Tingling or numbness.  Pins and needles.  Skin crawling.  Buzzing.  Limbs falling asleep.  Itching. Most people experience temporary (transient) paresthesia at some time in their lives. Paresthesia may occur when you breathe too quickly (hyperventilation). It can also occur without any apparent cause. Commonly, paresthesia occurs when pressure is placed on a nerve. The sensation quickly goes away after the pressure is removed. For some people, however, paresthesia is a long-lasting (chronic) condition that is caused by an underlying disorder. If you continue to have paresthesia, you may need further medical evaluation. HOME CARE INSTRUCTIONS Watch your condition for any changes. Taking the following actions may help to lessen any discomfort that you are feeling:  Avoid drinking alcohol.  Try acupuncture or massage to help relieve your symptoms.  Keep all follow-up visits as directed by your health care provider. This is important. SEEK MEDICAL CARE IF:  You continue to have episodes of paresthesia.  Your burning or prickling feeling gets worse when you walk.  You have pain, cramps, or dizziness.  You develop a rash. SEEK IMMEDIATE MEDICAL CARE IF:  You feel weak.  You have trouble walking or moving.  You have problems with speech, understanding, or vision.  You feel confused.  You cannot control your bladder or bowel movements.  You have numbness after an injury.  You faint.   This information is not intended to replace advice given to you by your health care provider. Make sure you discuss any questions you have with your health care provider.   Document Released: 10/22/2002 Document Revised: 03/18/2015 Document Reviewed:  10/28/2014 Elsevier Interactive Patient Education 2016 [employer]  

## 2016-04-18 NOTE — ED Notes (Signed)
Pt c/o burning in both feet.  Onset tonight

## 2016-04-18 NOTE — ED Provider Notes (Signed)
CSN: [id number]     Arrival date & time 04/18/16  0015 History   First MD Initiated Contact with Patient 04/18/16 0045     Chief Complaint  Patient presents with  . Foot Pain     (Consider location/radiation/quality/duration/timing/severity/associated sxs/prior Treatment) HPI Comments: 60 year old female with a history of schizophrenia presents to the emergency department for evaluation of a burning sensation in her bilateral feet. Patient has been seen multiple times in the emergency department for similar symptoms. She was initially started on Atarax which provided her no relief. She has been taking Zyrtec in addition without improvement. She notices that the pain is worse when she is supine. She is not on any psychiatric medications. She denies associated fever. Patient is also not a diabetic. She has no associated rash.  Patient is a 60 y.o. female presenting with lower extremity pain. The history is provided by the patient. No language interpreter was used.  Foot Pain Pertinent negatives include no fever or rash.    Past Medical History  Diagnosis Date  . Schizophrenia (HCC)   . Chronic knee pain    Past Surgical History  Procedure Laterality Date  . Cholecystectomy     No family history on file. Social History  Substance Use Topics  . Smoking status: Never Smoker   . Smokeless tobacco: None  . Alcohol Use: No   OB History    No data available      Review of Systems  Constitutional: Negative for fever.  Skin: Negative for rash.  Neurological:       +paresthesias b/l feet  Ten systems reviewed and are negative for acute change, except as noted in the HPI.    Allergies  Review of patient's allergies indicates no known allergies.  Home Medications   Prior to Admission medications   Medication Sig Start Date End Date Taking? Authorizing Provider  Acetaminophen (TYLENOL) 325 MG CAPS Take 325 mg by mouth 2 (two) times daily as needed (pain).    Historical Provider,  MD  cetirizine (ZYRTEC ALLERGY) 10 MG tablet Take 1 tablet (10 mg total) by mouth daily. 04/14/16   Joycie PeekBenjamin Cartner, PA-C  gabapentin (NEURONTIN) 300 MG capsule Take 1 capsule (300 mg total) by mouth every 8 (eight) hours. Take 1 tablet on day 1. Take 1 tablet every 12 hours on day 2. Take 1 tablet every 8 hours on day 3 and every day thereafter. 04/18/16   Antony MaduraKelly Emidio Warrell, PA-C  hydrOXYzine (ATARAX/VISTARIL) 25 MG tablet Take 1 tablet (25 mg total) by mouth every 6 (six) hours. 04/10/16   Shawn C Joy, PA-C   BP 120/53 mmHg  Pulse 62  Temp(Src) 97.7 F (36.5 C) (Oral)  Resp 16  SpO2 96%   Physical Exam  Constitutional: She is oriented to person, place, and time. She appears well-developed and well-nourished. No distress.  Nontoxic appearing. Patient in no distress.  HENT:  Head: Normocephalic and atraumatic.  Eyes: Conjunctivae and EOM are normal. No scleral icterus.  Neck: Normal range of motion.  Cardiovascular: Normal rate, regular rhythm and intact distal pulses.   DP and PT pulses 2+ b/l  Pulmonary/Chest: Effort normal. No respiratory distress.  Musculoskeletal: Normal range of motion.  Neurological: She is alert and oriented to person, place, and time. She exhibits normal muscle tone. Coordination normal.  Sensation to light touch intact in the b/l lower extremities, feet, and toes. Patient able to wiggle all toes. She is ambulatory in the ED.  Skin: Skin is warm and  dry. No rash noted. She is not diaphoretic. No erythema. No pallor.  Psychiatric: She has a normal mood and affect. Her behavior is normal.  Nursing note and vitals reviewed.   ED Course  Procedures (including critical care time) Labs Review Labs Reviewed - No data to display  Imaging Review No results found.   I have personally reviewed and evaluated these images and lab results as part of my medical decision-making.   EKG Interpretation None      MDM   Final diagnoses:  Formication    61 year old  female presents to the emergency department for evaluation of paresthesias in her bilateral feet. This is her third visit for similar symptoms in the past week. She has previously tried Atarax and Zyrtec without improvement. She is not on any daily medications is not a diabetic. Patient neurovascularly intact on exam. She is ambulatory in the ED. No history of trauma. No evidence of septic joint or infection.  It is possible that the patient's symptoms may be related to her schizophrenia. She is not on any medications for his schizophrenia currently. I have discussed with the patient recommendations to discontinue Zyrtec. Will start the patient on gabapentin in hopes that it helps her symptoms. Primary care follow-up recommended and return precautions given. Patient discharged in satisfactory condition with no unaddressed concerns.   Filed Vitals:   04/18/16 0021 04/18/16 0052 04/18/16 0053 04/18/16 0100  BP: 119/67 120/53  124/86  Pulse: 80 62  60  Temp: 97.7 F (36.5 C)     TempSrc: Oral     Resp: 16     SpO2: 98% 96% 96% 97%     [name], PA-C 04/18/16 0110  Benjiman Core, MD 04/18/16 [id number]

## 2016-04-19 ENCOUNTER — Ambulatory Visit (HOSPITAL_COMMUNITY)
Admission: EM | Admit: 2016-04-19 | Discharge: 2016-04-19 | Disposition: A | Discharge status: Home / Self Care | Payer: Medicaid Other | Attending: Family Medicine | Admitting: Family Medicine

## 2016-04-19 ENCOUNTER — Encounter (HOSPITAL_COMMUNITY): Payer: Self-pay | Admitting: Family Medicine

## 2016-04-19 DIAGNOSIS — K648 Other hemorrhoids: Secondary | ICD-10-CM

## 2016-04-19 DIAGNOSIS — K644 Residual hemorrhoidal skin tags: Secondary | ICD-10-CM

## 2016-04-19 LAB — POCT I-STAT, CHEM 8
BUN: 11 mg/dL (ref 6–20)
Calcium, Ion: 1.1 mmol/L — ABNORMAL LOW (ref 1.12–1.23)
Chloride: 102 mmol/L (ref 101–111)
Creatinine, Ser: 0.8 mg/dL (ref 0.44–1.00)
GLUCOSE: 98 mg/dL (ref 65–99)
HCT: 42 % (ref 36.0–46.0)
HEMOGLOBIN: 14.3 g/dL (ref 12.0–15.0)
POTASSIUM: 3.5 mmol/L (ref 3.5–5.1)
SODIUM: 141 mmol/L (ref 135–145)
TCO2: 30 mmol/L (ref 0–100)

## 2016-04-19 MED ORDER — HYDROCORTISONE 2.5 % RE CREA: TOPICAL_CREAM | RECTAL | Status: DC

## 2016-04-19 NOTE — ED Notes (Signed)
Pt here for 2 months of rectal bleeding. sts bright red.  sts also bilateral foot pain. sts some abd pain before using bathroom.

## 2016-04-19 NOTE — Discharge Instructions (Signed)
Hemorrhoids  Hemorrhoids are puffy (swollen) veins around the rectum or anus. Hemorrhoids can cause pain, itching, bleeding, or irritation.  HOME CARE  · Eat foods with fiber, such as whole grains, beans, nuts, fruits, and vegetables. Ask your doctor about taking products with added fiber in them (fiber supplements).   · Drink enough fluid to keep your pee (urine) clear or pale yellow.  · Exercise often.  · Go to the bathroom when you have the urge to poop. Do not wait.  · Avoid straining to poop (bowel movement).  · Keep the butt area dry and clean. Use wet toilet paper or moist paper towels.  · Medicated creams and medicine inserted into the anus (anal suppository) may be used or applied as told.  · Only take medicine as told by your doctor.  · Take a warm water bath (sitz bath) for 15-20 minutes to ease pain. Do this 3-4 times a day.  · Place ice packs on the area if it is tender or puffy. Use the ice packs between the warm water baths.    Put ice in a plastic bag.    Place a towel between your skin and the bag.    Leave the ice on for 15-20 minutes, 03-04 times a day.  · Do not use a donut-shaped pillow or sit on the toilet for a long time.  GET HELP RIGHT AWAY IF:   · You have more pain that is not controlled by treatment or medicine.  · You have bleeding that will not stop.  · You have trouble or are unable to poop (bowel movement).  · You have pain or puffiness outside the area of the hemorrhoids.  MAKE SURE YOU:   · Understand these instructions.  · Will watch your condition.  · Will get help right away if you are not doing well or get worse.     This information is not intended to replace advice given to you by your health care provider. Make sure you discuss any questions you have with your health care provider.     Document Released: 08/10/2008 Document Revised: 10/18/2012 Document Reviewed: 09/12/2012  Elsevier Interactive Patient Education ©2016 Elsevier Inc.

## 2016-04-19 NOTE — ED Provider Notes (Signed)
CSN: [id number]     Arrival date & time 04/19/16  1347 History   First MD Initiated Contact with Patient 04/19/16 1457     Chief Complaint  Patient presents with  . Rectal Bleeding  . Foot Pain   (Consider location/radiation/quality/duration/timing/severity/associated sxs/prior Treatment) HPI History obtained from patient:  Pt presents with the cc of:  Rectal bleeding Duration of symptoms: Several days Treatment prior to arrival: No treatment Context: She states that she suddenly developed rectal bleeding that constantly drips after having a bowel movement. She denies ever having this type of symptom before. Other symptoms include: Bilateral foot pain Pain score: 4 FAMILY HISTORY: Mental health issues (schizophrenia) and family members    Past Medical History  Diagnosis Date  . Schizophrenia (HCC)   . Chronic knee pain    Past Surgical History  Procedure Laterality Date  . Cholecystectomy     History reviewed. No pertinent family history. Social History  Substance Use Topics  . Smoking status: Never Smoker   . Smokeless tobacco: None  . Alcohol Use: No   OB History    No data available     Review of Systems  Denies: HEADACHE, NAUSEA, ABDOMINAL PAIN, CHEST PAIN, CONGESTION, DYSURIA, SHORTNESS OF BREATH  Allergies  Review of patient's allergies indicates no known allergies.  Home Medications   Prior to Admission medications   Medication Sig Start Date End Date Taking? Authorizing Provider  cetirizine (ZYRTEC ALLERGY) 10 MG tablet Take 1 tablet (10 mg total) by mouth daily. 04/14/16  Yes Benjamin Cartner, PA-C  gabapentin (NEURONTIN) 300 MG capsule Take 1 capsule (300 mg total) by mouth every 8 (eight) hours. Take 1 tablet on day 1. Take 1 tablet every 12 hours on day 2. Take 1 tablet every 8 hours on day 3 and every day thereafter. 04/18/16  Yes [name], PA-C  hydrOXYzine (ATARAX/VISTARIL) 25 MG tablet Take 1 tablet (25 mg total) by mouth every 6 (six) hours.  04/10/16  Yes Shawn C Joy, PA-C  Acetaminophen (TYLENOL) 325 MG CAPS Take 325 mg by mouth 2 (two) times daily as needed (pain).    Historical Provider, MD  hydrocortisone (ANUSOL-HC) 2.5 % rectal cream Apply rectally 2 times daily 04/19/16   [name], PA   Meds Ordered and Administered this Visit  Medications - No data to display  BP 157/77 mmHg  Pulse 57  Temp(Src) 98 F (36.7 C) (Oral)  Resp 18  SpO2 98% No data found.   Physical Exam NURSES NOTES AND VITAL SIGNS REVIEWED. CONSTITUTIONAL: Well developed, well nourished, no acute distress HEENT: normocephalic, atraumatic EYES: Conjunctiva normal NECK:normal ROM, supple, no adenopathy PULMONARY:No respiratory distress, normal effort ABDOMINAL: Soft, ND, NT BS+, No CVAT Rectal exam is performed with female chaperone present. Patient has several large external hemorrhoids that are bleeding. No internal masses are palpable. MUSCULOSKELETAL: Normal ROM of all extremities,  SKIN: warm and dry without rash PSYCHIATRIC: Mood and affect, behavior are normal  ED Course  Procedures (including critical care time)  Labs Review Labs Reviewed  POCT I-STAT, CHEM 8 - Abnormal; Notable for the following:    Calcium, Ion 1.10 (*)    All other components within normal limits    Imaging Review No results found.   Visual Acuity Review  Right Eye Distance:   Left Eye Distance:   Bilateral Distance:    Right Eye Near:   Left Eye Near:    Bilateral Near:       RX anusol cream  I expect a full recovery from this issue without long-term sequelae. At some point in the future patient may need to see a rectal surgeon for banding of her hemorrhoids as they are rather large.  MDM   1. Bleeding external hemorrhoids     Patient is reassured that there are no issues that require transfer to higher level of care at this time or additional tests. Patient is advised to continue home symptomatic treatment. Patient is advised that if  there are new or worsening symptoms to attend the emergency department, contact primary care provider, or return to UC. Instructions of care provided discharged home in stable condition.    THIS NOTE WAS GENERATED USING A VOICE RECOGNITION SOFTWARE PROGRAM. ALL REASONABLE EFFORTS  WERE MADE TO PROOFREAD THIS DOCUMENT FOR ACCURACY.  I have verbally reviewed the discharge instructions with the patient. A printed AVS was given to the patient.  All questions were answered prior to discharge.      Tharon AquasFrank C Aashi Derrington, PA 04/19/16 1550

## 2016-04-21 ENCOUNTER — Ambulatory Visit (HOSPITAL_COMMUNITY)
Admission: EM | Admit: 2016-04-21 | Discharge: 2016-04-21 | Disposition: A | Discharge status: Home / Self Care | Payer: Medicaid Other

## 2016-04-21 ENCOUNTER — Encounter (HOSPITAL_COMMUNITY): Payer: Self-pay | Admitting: *Deleted

## 2016-04-21 DIAGNOSIS — G8929 Other chronic pain: Secondary | ICD-10-CM

## 2016-04-21 DIAGNOSIS — K6289 Other specified diseases of anus and rectum: Principal | ICD-10-CM

## 2016-04-21 NOTE — ED Provider Notes (Signed)
CSN: [id number]     Arrival date & time 04/21/16  1303 History   None    Chief Complaint  Patient presents with  . Back Pain   (Consider location/radiation/quality/duration/timing/severity/associated sxs/prior Treatment) HPI History obtained from patient:  Pt presents with the cc of:  Back/buttock pain Duration of symptoms: Several days worse since yesterday Treatment prior to arrival: No treatment Context: Patient was recently seen for hemorrhoids but has not treated them states that the pain is worsening now. Other symptoms include: No further bleeding Pain score: 3 FAMILY HISTORY: Mental illness    Past Medical History  Diagnosis Date  . Schizophrenia (HCC)   . Chronic knee pain    Past Surgical History  Procedure Laterality Date  . Cholecystectomy     History reviewed. No pertinent family history. Social History  Substance Use Topics  . Smoking status: Never Smoker   . Smokeless tobacco: None  . Alcohol Use: No   OB History    No data available     Review of Systems  Denies: HEADACHE, NAUSEA, ABDOMINAL PAIN, CHEST PAIN, CONGESTION, DYSURIA, SHORTNESS OF BREATH  Allergies  Review of patient's allergies indicates no known allergies.  Home Medications   Prior to Admission medications   Medication Sig Start Date End Date Taking? Authorizing Provider  Acetaminophen (TYLENOL) 325 MG CAPS Take 325 mg by mouth 2 (two) times daily as needed (pain).    Historical Provider, MD  cetirizine (ZYRTEC ALLERGY) 10 MG tablet Take 1 tablet (10 mg total) by mouth daily. 04/14/16   Joycie PeekBenjamin Cartner, PA-C  gabapentin (NEURONTIN) 300 MG capsule Take 1 capsule (300 mg total) by mouth every 8 (eight) hours. Take 1 tablet on day 1. Take 1 tablet every 12 hours on day 2. Take 1 tablet every 8 hours on day 3 and every day thereafter. 04/18/16   Antony MaduraKelly Humes, PA-C  hydrocortisone (ANUSOL-HC) 2.5 % rectal cream Apply rectally 2 times daily 04/19/16   Tharon AquasFrank C Patrick, PA  hydrOXYzine  (ATARAX/VISTARIL) 25 MG tablet Take 1 tablet (25 mg total) by mouth every 6 (six) hours. 04/10/16   Shawn C Joy, PA-C   Meds Ordered and Administered this Visit  Medications - No data to display  There were no vitals taken for this visit. No data found.   Physical Exam NURSES NOTES AND VITAL SIGNS REVIEWED. CONSTITUTIONAL: Well developed, well nourished, no acute distress HEENT: normocephalic, atraumatic EYES: Conjunctiva normal NECK:normal ROM, supple, no adenopathy PULMONARY:No respiratory distress, normal effort ABDOMINAL: Soft, ND, NT BS+, No CVAT MUSCULOSKELETAL: Normal ROM of all extremities,  SKIN: warm and dry without rash PSYCHIATRIC: Mood and affect, behavior are normal  ED Course  Procedures (including critical care time)  Labs Review Labs Reviewed - No data to display  Imaging Review No results found.   Visual Acuity Review  Right Eye Distance:   Left Eye Distance:   Bilateral Distance:    Right Eye Near:   Left Eye Near:    Bilateral Near:      Patient is advised that she needs to pick up her medication from the pharmacy and treat herself symptomatically. Also referral to a primary care provider should be done. As noted before. Patient should have full recovery unless she needs banding for her hemorrhoids.   MDM   1. Rectal pain, chronic     Patient is reassured that there are no issues that require transfer to higher level of care at this time or additional tests. Patient is advised to  continue home symptomatic treatment. Patient is advised that if there are new or worsening symptoms to attend the emergency department, contact primary care provider, or return to UC. Instructions of care provided discharged home in stable condition.    THIS NOTE WAS GENERATED USING A VOICE RECOGNITION SOFTWARE PROGRAM. ALL REASONABLE EFFORTS  WERE MADE TO PROOFREAD THIS DOCUMENT FOR ACCURACY.  I have verbally reviewed the discharge instructions with the patient. A  printed AVS was given to the patient.  All questions were answered prior to discharge.      [name], PA 04/21/16 1416

## 2016-04-21 NOTE — Discharge Instructions (Signed)
Hemorrhoids You need to pick your medication up from the [facility] pharmacy Hemorrhoids are puffy (swollen) veins around the rectum or anus. Hemorrhoids can cause pain, itching, bleeding, or irritation. HOME CARE  Eat foods with fiber, such as whole grains, beans, nuts, fruits, and vegetables. Ask your doctor about taking products with added fiber in them (fibersupplements).  Drink enough fluid to keep your pee (urine) clear or pale yellow.  Exercise often.  Go to the bathroom when you have the urge to poop. Do not wait.  Avoid straining to poop (bowel movement).  Keep the butt area dry and clean. Use wet toilet paper or moist paper towels.  Medicated creams and medicine inserted into the anus (anal suppository) may be used or applied as told.  Only take medicine as told by your doctor.  Take a warm water bath (sitz bath) for 15-20 minutes to ease pain. Do this 3-4 times a day.  Place ice packs on the area if it is tender or puffy. Use the ice packs between the warm water baths.  Put ice in a plastic bag.  Place a towel between your skin and the bag.  Leave the ice on for 15-20 minutes, 03-04 times a day.  Do not use a donut-shaped pillow or sit on the toilet for a long time. GET HELP RIGHT AWAY IF:  1. You have more pain that is not controlled by treatment or medicine. 2. You have bleeding that will not stop. 3. You have trouble or are unable to poop (bowel movement). 4. You have pain or puffiness outside the area of the hemorrhoids. MAKE SURE YOU:   Understand these instructions.  Will watch your condition.  Will get help right away if you are not doing well or get worse.   This information is not intended to replace advice given to you by your health care provider. Make sure you discuss any questions you have with your health care provider.   Document Released: 08/10/2008 Document Revised: 10/18/2012 Document Reviewed: 09/12/2012 Elsevier Interactive Patient Education  2016 [employer]  How to Take a ITT IndustriesSitz Bath A sitz bath is a warm water bath that is taken while you are sitting down. The water should only come up to your hips and should cover your buttocks. Your health care provider may recommend a sitz bath to help you:   Clean the lower part of your body, including your genital area.  With itching.  With pain.  With sore muscles or muscles that tighten or spasm. HOW TO TAKE A SITZ BATH Take 3-4 sitz baths per day or as told by your health care provider. 5. Partially fill a bathtub with warm water. You will only need the water to be deep enough to cover your hips and buttocks when you are sitting in it. 6. If your health care provider told you to put medicine in the water, follow the directions exactly. 7. Sit in the water and open the tub drain a little. 8. Turn on the warm water again to keep the tub at the correct level. Keep the water running constantly. 9. Soak in the water for 15-20 minutes or as told by your health care provider. 10. After the sitz bath, pat the affected area dry first. Do not rub it. 11. Be careful when you stand up after the sitz bath because you may feel dizzy. SEEK MEDICAL CARE IF:  Your symptoms get worse. Do not continue with sitz baths if your symptoms get worse.  You have new symptoms. Do not continue with sitz baths until you talk with your health care provider.   This information is not intended to replace advice given to you by your health care provider. Make sure you discuss any questions you have with your health care provider.   Document Released: 07/24/2004 Document Revised: 03/18/2015 Document Reviewed: 10/30/2014 Elsevier Interactive Patient Education [employer]

## 2016-04-21 NOTE — ED Notes (Signed)
Pt  Reports   Rectal      Bleeding    And  Pain in rectal  Area         For  Several  Days          Pt  Reports     Feels like   Something  Is  Poking  Her  Back  There    She  Ambulated  To  Room   With   A   Steady fluid  Gait

## 2016-05-05 ENCOUNTER — Emergency Department (HOSPITAL_COMMUNITY)
Admission: EM | Admit: 2016-05-05 | Discharge: 2016-05-05 | Disposition: A | Discharge status: Home / Self Care | Payer: Medicaid Other | Attending: Dermatology | Admitting: Dermatology

## 2016-05-05 ENCOUNTER — Encounter (HOSPITAL_COMMUNITY): Payer: Self-pay | Admitting: Nurse Practitioner

## 2016-05-05 DIAGNOSIS — Z5321 Procedure and treatment not carried out due to patient leaving prior to being seen by health care provider: Secondary | ICD-10-CM | POA: Insufficient documentation

## 2016-05-05 DIAGNOSIS — R51 Headache: Secondary | ICD-10-CM | POA: Insufficient documentation

## 2016-05-05 NOTE — ED Notes (Signed)
She c/o 1 month history of posterior headache. She tried tylenol with no relief. denies vision changes, n/v, head injury.she is alert and breathing easily

## 2016-05-05 NOTE — ED Notes (Signed)
Pt called multiple times for room no answer nurse first aware

## 2016-05-24 ENCOUNTER — Encounter: Payer: Self-pay | Admitting: Family Medicine

## 2016-05-24 ENCOUNTER — Ambulatory Visit (INDEPENDENT_AMBULATORY_CARE_PROVIDER_SITE_OTHER): Payer: Medicaid Other | Admitting: Family Medicine

## 2016-05-24 VITALS — BP 135/77 | HR 71 | Temp 98.8°F | Resp 14 | Ht 62.0 in | Wt 164.0 lb

## 2016-05-24 DIAGNOSIS — Z1239 Encounter for other screening for malignant neoplasm of breast: Secondary | ICD-10-CM | POA: Diagnosis not present

## 2016-05-24 DIAGNOSIS — Z23 Encounter for immunization: Secondary | ICD-10-CM

## 2016-05-24 DIAGNOSIS — F209 Schizophrenia, unspecified: Secondary | ICD-10-CM

## 2016-05-24 DIAGNOSIS — G629 Polyneuropathy, unspecified: Secondary | ICD-10-CM | POA: Diagnosis not present

## 2016-05-24 DIAGNOSIS — Z13220 Encounter for screening for lipoid disorders: Secondary | ICD-10-CM | POA: Diagnosis not present

## 2016-05-24 DIAGNOSIS — J309 Allergic rhinitis, unspecified: Secondary | ICD-10-CM

## 2016-05-24 DIAGNOSIS — K219 Gastro-esophageal reflux disease without esophagitis: Secondary | ICD-10-CM

## 2016-05-24 LAB — LIPID PANEL
CHOL/HDL RATIO: 3.6 ratio (ref <=5.0)
CHOLESTEROL: 200 mg/dL (ref 125–200)
HDL: 56 mg/dL (ref >=46)
LDL Cholesterol: 122 mg/dL (ref <130)
TRIGLYCERIDES: 109 mg/dL (ref <150)
VLDL: 22 mg/dL (ref <30)

## 2016-05-24 MED ORDER — CETIRIZINE HCL 10 MG PO TABS: 10.0000 mg | ORAL_TABLET | Freq: Every day | ORAL | Status: DC

## 2016-05-24 MED ORDER — GABAPENTIN 300 MG PO CAPS: 300.0000 mg | ORAL_CAPSULE | Freq: Three times a day (TID) | ORAL | Status: DC

## 2016-05-24 MED ORDER — PANTOPRAZOLE SODIUM 40 MG PO TBEC: 40.0000 mg | DELAYED_RELEASE_TABLET | Freq: Every day | ORAL | Status: DC

## 2016-05-24 NOTE — Patient Instructions (Signed)
Take protonix once a day for probable heartburn. Come back in 6 months or sooner if needed

## 2016-05-26 NOTE — Progress Notes (Signed)
Patient ID: Felicia Collier, female   DOB: 1956/11/04, 60 y.o.   MRN: [id number]   Felicia Collier, is a 60 y.o. female  [id number]  [id number]  DOB - 1956/11/04  CC:  Chief Complaint  Patient presents with  . Establish Care  . Knee Pain    left knee pain   . Foot Pain    both feet        HPI: Felicia Collier is a 60 y.o. female here to establish care. Patient has a history of schizophrenia and is a very poor historian. She is accompanied by her case Production designer, theatre/television/filmmanager. She has been in ED on 18 occassions this year for various complaints including rectal pain, atypical chest pain, chest wall pain, abd pain, back pain, knee pain, allergic reaction, bronchitis and formication. Her simi regular meds are gabapentin for some of her sensory issues, Protonix for heartburn, and zyrtec for allergies. She sometimes takes tylemol for pain. She receives Haldol injections from mental health but I am unsure how regularly.  No Known Allergies Past Medical History  Diagnosis Date  . Schizophrenia (HCC)   . Chronic knee pain    Current Outpatient Prescriptions on File Prior to Visit  Medication Sig Dispense Refill  . Acetaminophen (TYLENOL) 325 MG CAPS Take 325 mg by mouth 2 (two) times daily as needed (pain). Reported on 05/24/2016    . hydrocortisone (ANUSOL-HC) 2.5 % rectal cream Apply rectally 2 times daily (Patient not taking: Reported on 05/24/2016) 35 g 2   No current facility-administered medications on file prior to visit.   History reviewed. No pertinent family history. Social History   Social History  . Marital Status: Single    Spouse Name: N/A  . Number of Children: N/A  . Years of Education: N/A   Occupational History  . Not on file.   Social History Main Topics  . Smoking status: Never Smoker   . Smokeless tobacco: Not on file  . Alcohol Use: No  . Drug Use: No  . Sexual Activity: Not on file   Other Topics Concern  . Not on file   Social History Narrative    Review of  Systems: Constitutional: Negative for fever, chills, appetite change, weight loss,  Fatigue. Skin: Negative for rashes or lesions of concern. HENT: Negative for ear pain, ear discharge.nose bleeds. Throat minorly sore now Eyes: Negative for pain, discharge, redness, itching and visual disturbance.Reports burning of her eyes Neck: Negative for pain, stiffness Respiratory: Negative for cough. Reports intermittent shortness of breath,   Cardiovascular: Reports intermittent  chest pain, palpitations and leg swelling. Gastrointestinal: reports intermittent  abdominal pain, nausea, vomiting, diarrhea, constipations and heartburn Genitourinary: Negative for dysuria, urgency, frequency, hematuria,  Musculoskeletal: Reports pain in hands, wrist, feet and knees Neurological: Negative for dizziness, tremors, seizures, syncope,   light-headedness. Reports numbness, tingling, headaches Hematological: Negative for easy bruising or bleeding Psychiatric/Behavioral: Negative for depression, anxiety, decreased concentration, confusion. Positive for schizophrenia.  Objective:   Filed Vitals:   05/24/16 1310  BP: 135/77  Pulse: 71  Temp: 98.8 F (37.1 C)  Resp: 14    Physical Exam: Constitutional: Patient appears well-developed and well-nourished. No distress. HENT: Normocephalic, atraumatic, External right and left ear normal. Oropharynx is clear and moist.  Eyes: Conjunctivae and EOM are normal. PERRLA, no scleral icterus. Neck: Normal ROM. Neck supple. No lymphadenopathy, No thyromegaly. CVS: RRR, S1/S2 +, no murmurs, no gallops, no rubs Pulmonary: Effort and breath sounds normal, no stridor, rhonchi, wheezes, rales.  Abdominal: Soft.  Normoactive BS,, no distension, tenderness, rebound or guarding.  Musculoskeletal: Normal range of motion. No edema and no tenderness.  Neuro: Alert.Normal muscle tone coordination. Non-focal Skin: Skin is warm and dry. No rash noted. Not diaphoretic. No erythema. No  pallor. Psychiatric: Normal mood and affect. Behavior, judgment. Some rambling of speech and thoughts  Lab Results  Component Value Date   WBC 8.6 04/10/2016   HGB 14.3 04/19/2016   HCT 42.0 04/19/2016   MCV 81.8 04/10/2016   PLT 282 04/10/2016   Lab Results  Component Value Date   CREATININE 0.80 04/19/2016   BUN 11 04/19/2016   NA 141 04/19/2016   K 3.5 04/19/2016   CL 102 04/19/2016   CO2 26 04/10/2016    No results found for: HGBA1C Lipid Panel     Component Value Date/Time   CHOL 200 05/24/2016 1403   TRIG 109 05/24/2016 1403   HDL 56 05/24/2016 1403   CHOLHDL 3.6 05/24/2016 1403   VLDL 22 05/24/2016 1403   LDLCALC 122 05/24/2016 1403       Assessment and plan:   1. Screening cholesterol level  - Lipid panel  2. Breast cancer screening  - MM DIGITAL SCREENING BILATERAL; Future  3. Neuropathy (HCC) - gabapentin (NEURONTIN) 300 MG capsule; Take 1 capsule (300 mg total) by mouth every 8 (eight) hours.  Refill: 1  4. Gastroesophageal reflux disease, esophagitis presence not specified  - pantoprazole (PROTONIX) 40 MG tablet; Take 1 tablet (40 mg total) by mouth daily.  Dispense: 30 tablet; Refill: 3  5. Allergic rhinitis, unspecified allergic rhinitis type  - cetirizine (ZYRTEC ALLERGY) 10 MG tablet; Take 1 tablet (10 mg total) by mouth daily.  Dispense: 30 tablet; Refill: 3  6. Need for Tdap vaccination  - Tdap vaccine greater than or equal to 7yo IM  7. Schizophrenia, unspecified type (HCC) -Follow-up with mental health.   Return in about 6 months (around 11/24/2016).  The patient was given clear instructions to go to ER or return to medical center if symptoms don't improve, worsen or new problems develop. The patient verbalized understanding.    [name] FNP  05/26/2016, 2:58 PM

## 2016-06-07 ENCOUNTER — Emergency Department (HOSPITAL_COMMUNITY): Payer: Medicaid Other

## 2016-06-07 ENCOUNTER — Encounter (HOSPITAL_COMMUNITY): Payer: Self-pay | Admitting: Emergency Medicine

## 2016-06-07 ENCOUNTER — Emergency Department (HOSPITAL_COMMUNITY)
Admission: EM | Admit: 2016-06-07 | Discharge: 2016-06-07 | Disposition: A | Discharge status: Home / Self Care | Payer: Medicaid Other | Attending: Emergency Medicine | Admitting: Emergency Medicine

## 2016-06-07 DIAGNOSIS — W010XXA Fall on same level from slipping, tripping and stumbling without subsequent striking against object, initial encounter: Secondary | ICD-10-CM | POA: Insufficient documentation

## 2016-06-07 DIAGNOSIS — Y999 Unspecified external cause status: Secondary | ICD-10-CM | POA: Insufficient documentation

## 2016-06-07 DIAGNOSIS — Y92009 Unspecified place in unspecified non-institutional (private) residence as the place of occurrence of the external cause: Secondary | ICD-10-CM | POA: Insufficient documentation

## 2016-06-07 DIAGNOSIS — Y93E1 Activity, personal bathing and showering: Secondary | ICD-10-CM | POA: Diagnosis not present

## 2016-06-07 DIAGNOSIS — S6992XA Unspecified injury of left wrist, hand and finger(s), initial encounter: Secondary | ICD-10-CM | POA: Diagnosis present

## 2016-06-07 DIAGNOSIS — S63502A Unspecified sprain of left wrist, initial encounter: Secondary | ICD-10-CM | POA: Diagnosis not present

## 2016-06-07 MED ORDER — TRAMADOL HCL 50 MG PO TABS: 50.0000 mg | ORAL_TABLET | Freq: Four times a day (QID) | ORAL | 0 refills | Status: DC | PRN

## 2016-06-07 MED ORDER — NAPROXEN 250 MG PO TABS
500.0000 mg | ORAL_TABLET | Freq: Once | ORAL | Status: AC
  Administered 2016-06-07: 500 mg via ORAL
  Filled 2016-06-07: qty 2

## 2016-06-07 NOTE — ED Triage Notes (Signed)
Pt. slipped and fell at home last week reports left wrist pain with mild swelling .

## 2016-06-07 NOTE — ED Notes (Signed)
Xray called pt in waiting room but no answer

## 2016-06-07 NOTE — ED Provider Notes (Signed)
[facility]PT Provider Note   CSN: [id number] Arrival date & time: 06/07/16  1478  First Provider Contact:  First MD Initiated Contact with Patient 06/07/16 0441        History   Chief Complaint Chief Complaint  Patient presents with  . Wrist Pain    HPI Felicia Collier is a 60 y.o. female.  60 year old female presents to the emergency department for evaluation of left wrist pain. She reports that she slipped getting out of the shower one week ago causing injury to her left wrist. She reports a "soreness" that has been constant. She denies taking any medications for her symptoms, but did by a brace yesterday. She cannot state whether she feels as though this brace has helped her. Patient denies any extremity numbness or weakness. She has had no fevers or redness to her wrist. She does report some mild swelling.  She states that she has been followed by Dr. Magnus Ivan, of [facility] orthopedics, in the past.    Past Medical History:  Diagnosis Date  . Chronic knee pain   . Schizophrenia (HCC)     There are no active problems to display for this patient.   Past Surgical History:  Procedure Laterality Date  . CHOLECYSTECTOMY      OB History    No data available       Home Medications    Prior to Admission medications   Medication Sig Start Date End Date Taking? Authorizing Provider  Acetaminophen (TYLENOL) 325 MG CAPS Take 325 mg by mouth 2 (two) times daily as needed (pain). Reported on 05/24/2016   Yes Historical Provider, MD  pantoprazole (PROTONIX) 40 MG tablet Take 1 tablet (40 mg total) by mouth daily. 05/24/16  Yes [name], NP  cetirizine (ZYRTEC ALLERGY) 10 MG tablet Take 1 tablet (10 mg total) by mouth daily. Patient not taking: Reported on 06/07/2016 05/24/16   [name], NP  gabapentin (NEURONTIN) 300 MG capsule Take 1 capsule (300 mg total) by mouth every 8 (eight) hours. Take 1 tablet on day 1. Take 1 tablet every 12 hours on day 2. Take 1  tablet every 8 hours on day 3 and every day thereafter. Patient not taking: Reported on 06/07/2016 05/24/16   [name], NP  hydrocortisone (ANUSOL-HC) 2.5 % rectal cream Apply rectally 2 times daily Patient not taking: Reported on 05/24/2016 04/19/16   [name], PA  traMADol (ULTRAM) 50 MG tablet Take 1 tablet (50 mg total) by mouth every 6 (six) hours as needed for severe pain. 06/07/16   [name], PA-C    Family History No family history on file.  Social History Social History  Substance Use Topics  . Smoking status: Never Smoker  . Smokeless tobacco: Not on file  . Alcohol use No     Allergies   Review of patient's allergies indicates no known allergies.   Review of Systems Review of Systems  Constitutional: Negative for fever.  Musculoskeletal: Positive for arthralgias and joint swelling.  Neurological: Negative for numbness.  Ten systems reviewed and are negative for acute change, except as noted in the HPI.     Physical Exam Updated Vital Signs BP 133/72 (BP Location: Right Arm)   Pulse 93   Temp 98.4 F (36.9 C) (Oral)   Resp 16   SpO2 98%   Physical Exam  Constitutional: She is oriented to person, place, and time. She appears well-developed and well-nourished. No distress.  Nontoxic appearing and in  no distress.  HENT:  Head: Normocephalic and atraumatic.  Eyes: Conjunctivae and EOM are normal. No scleral icterus.  Neck: Normal range of motion.  Cardiovascular: Normal rate, regular rhythm and intact distal pulses.   Distal radial pulse 2+ in the left upper extremity. Capillary refill brisk in all digits of left hand.  Pulmonary/Chest: Effort normal. No respiratory distress.  Musculoskeletal: Normal range of motion.       Left wrist: She exhibits swelling. She exhibits normal range of motion, no crepitus and no deformity.       Left forearm: Normal.       Left hand: Normal.  Patient with normal range of motion of the left wrist. There is mild  soft tissue swelling noted to the dorsal radial aspect of the left wrist; potential small effusion. No bony deformity or crepitus. No associated erythema or heat to touch.  Neurological: She is alert and oriented to person, place, and time.  Sensation to light touch intact in all digits of left hand. Grip strength 5/5.  Skin: Skin is warm and dry. No rash noted. She is not diaphoretic. No erythema. No pallor.  Psychiatric: She has a normal mood and affect. Her behavior is normal.  Nursing note and vitals reviewed.    ED Treatments / Results  Labs (all labs ordered are listed, but only abnormal results are displayed) Labs Reviewed - No data to display  EKG  EKG Interpretation None       Radiology Dg Wrist Complete Left  Result Date: 06/07/2016 CLINICAL DATA:  60 year old female with fall and left wrist pain. EXAM: LEFT WRIST - COMPLETE 3+ VIEW COMPARISON:  None. FINDINGS: There is no acute fracture or dislocation. There is mild osteopenia. There is chronic fracture of the radial styloid. The soft tissues appear unremarkable. No radiopaque foreign object. IMPRESSION: No acute fracture or dislocation. Electronically Signed   By: Elgie Collard M.D.   On: 06/07/2016 04:32   Procedures Procedures (including critical care time)  Medications Ordered in ED Medications  naproxen (NAPROSYN) tablet 500 mg (not administered)     Initial Impression / Assessment and Plan / ED Course  I have reviewed the triage vital signs and the nursing notes.  Pertinent labs & imaging results that were available during my care of the patient were reviewed by me and considered in my medical decision making (see chart for details).  Clinical Course    60 year old female presents to the emergency department for evaluation of left wrist pain after an alleged fall 1 week ago. Patient is neurovascularly intact. No bony deformity or crepitus. No red flags or signs concerning for septic joint. X-ray  negative for fracture, dislocation, or other bony deformity.  Patient purchased a brace which was applied by this Clinical research associate while at bedside. No medications taken prior to arrival for symptoms. I have recommended Tylenol or ibuprofen for mild to moderate pain. Will discharge with a short course of tramadol for severe pain. Patient referred to Dr. Magnus Ivan as she states that she has seen him in the past for orthopedic complaints. Return precautions discussed and provided. Patient discharged in satisfactory condition with no unaddressed concerns.   Final Clinical Impressions(s) / ED Diagnoses   Final diagnoses:  Wrist sprain, left, initial encounter    New Prescriptions New Prescriptions   TRAMADOL (ULTRAM) 50 MG TABLET    Take 1 tablet (50 mg total) by mouth every 6 (six) hours as needed for severe pain.     [name], PA-C  06/07/16 0507    Shon Baton, MD 06/07/16 [phone]

## 2016-06-07 NOTE — Discharge Instructions (Signed)
Take tylenol or ibuprofen for pain. You may take Tramadol for severe pain, as needed. Follow up with your primary care doctor if pain continues. Follow up with Dr. Magnus Ivan, if desired.

## 2016-06-09 ENCOUNTER — Ambulatory Visit: Payer: Medicaid Other

## 2016-06-17 ENCOUNTER — Encounter (HOSPITAL_COMMUNITY): Payer: Self-pay | Admitting: Emergency Medicine

## 2016-06-17 ENCOUNTER — Emergency Department (HOSPITAL_COMMUNITY)
Admission: EM | Admit: 2016-06-17 | Discharge: 2016-06-18 | Disposition: A | Discharge status: Home / Self Care | Payer: Medicaid Other | Attending: Emergency Medicine | Admitting: Emergency Medicine

## 2016-06-17 DIAGNOSIS — M25532 Pain in left wrist: Secondary | ICD-10-CM

## 2016-06-17 DIAGNOSIS — Y999 Unspecified external cause status: Secondary | ICD-10-CM | POA: Diagnosis not present

## 2016-06-17 DIAGNOSIS — Y929 Unspecified place or not applicable: Secondary | ICD-10-CM | POA: Diagnosis not present

## 2016-06-17 DIAGNOSIS — Y939 Activity, unspecified: Secondary | ICD-10-CM | POA: Insufficient documentation

## 2016-06-17 DIAGNOSIS — W19XXXA Unspecified fall, initial encounter: Secondary | ICD-10-CM | POA: Insufficient documentation

## 2016-06-17 NOTE — ED Provider Notes (Signed)
[facility] Provider Note   CSN: [id number] Arrival date & time: 06/17/16  2338  First Provider Contact:  First MD Initiated Contact with Patient 06/17/16 2353 By signing my name below, I, Felicia Collier, attest that this documentation has been prepared under the direction and in the presence of [name], PA-C. Electronically Signed: Bridgette Collier, ED Scribe. 06/17/16. 11:57 PM.    History   Chief Complaint Chief Complaint  Patient presents with  . Wrist Pain   HPI Comments: Felicia Collier is a 60 y.o. female who presents to the Emergency Department complaining of sudden onset, aching, 8/10 constant left wrist pain and swelling onset two days ago. Pt was seen on 06/07/2016 for the same pain and had an x-ray done which was unremarkable. Pt was given Tramadol and she states it has not been helping her. Pt felt at home one day ago as well and notes that it worsened her wrist pain. Pt state's she has tried to contact her orthopedic, Dr. Magnus Ivan and states she has not been able to get an appointment. Pt is left handed. She denies numbness or paresthesia.   The history is provided by the patient. No language interpreter was used.    Past Medical History:  Diagnosis Date  . Chronic knee pain   . Schizophrenia (HCC)     There are no active problems to display for this patient.   Past Surgical History:  Procedure Laterality Date  . CHOLECYSTECTOMY      OB History    No data available       Home Medications    Prior to Admission medications   Medication Sig Start Date End Date Taking? Authorizing Provider  Acetaminophen (TYLENOL) 325 MG CAPS Take 325 mg by mouth 2 (two) times daily as needed (pain). Reported on 05/24/2016    Historical Provider, MD  cetirizine (ZYRTEC ALLERGY) 10 MG tablet Take 1 tablet (10 mg total) by mouth daily. Patient not taking: Reported on 06/07/2016 05/24/16   [name], NP  gabapentin (NEURONTIN) 300 MG capsule Take 1 capsule (300 mg total) by  mouth every 8 (eight) hours. Take 1 tablet on day 1. Take 1 tablet every 12 hours on day 2. Take 1 tablet every 8 hours on day 3 and every day thereafter. Patient not taking: Reported on 06/07/2016 05/24/16   [name], NP  hydrocortisone (ANUSOL-HC) 2.5 % rectal cream Apply rectally 2 times daily Patient not taking: Reported on 05/24/2016 04/19/16   [name], PA  pantoprazole (PROTONIX) 40 MG tablet Take 1 tablet (40 mg total) by mouth daily. 05/24/16   [name], NP  traMADol (ULTRAM) 50 MG tablet Take 1 tablet (50 mg total) by mouth every 6 (six) hours as needed for severe pain. 06/07/16   [name], PA-C    Family History No family history on file.  Social History Social History  Substance Use Topics  . Smoking status: Never Smoker  . Smokeless tobacco: Not on file  . Alcohol use No     Allergies   Review of patient's allergies indicates no known allergies.   Review of Systems Review of Systems  Constitutional: Negative for fever.  Musculoskeletal: Positive for arthralgias and joint swelling.  Neurological: Negative for numbness.  All other systems reviewed and are negative.    Physical Exam Updated Vital Signs BP 133/88 (BP Location: Right Arm)   Pulse 79   Temp 98.2 F (36.8 C) (Oral)   Resp 16  SpO2 99%   Physical Exam  Constitutional: She is oriented to person, place, and time. She appears well-developed and well-nourished. No distress.  HENT:  Head: Normocephalic and atraumatic.  Eyes: Conjunctivae are normal. Right eye exhibits no discharge. Left eye exhibits no discharge. No scleral icterus.  Cardiovascular: Normal rate.   Pulmonary/Chest: Effort normal.  Musculoskeletal: She exhibits edema and tenderness.  Mild swelling over distal radius. Decreased ROM of wrist secondary to pain. No obvious bony deformity. No erythema or warm to touch.  Neurological: She is alert and oriented to person, place, and time. Coordination normal.  Skin:  Skin is warm and dry. No rash noted. She is not diaphoretic. No erythema. No pallor.  Psychiatric: She has a normal mood and affect. Her behavior is normal.  Nursing note and vitals reviewed.    ED Treatments / Results  DIAGNOSTIC STUDIES: Oxygen Saturation is 99% on RA, normal by my interpretation.    COORDINATION OF CARE: 11:57 PM Discussed treatment plan with pt at bedside which includes x-ray and pt agreed to plan.  Labs (all labs ordered are listed, but only abnormal results are displayed) Labs Reviewed - No data to display  EKG  EKG Interpretation None       Radiology No results found.  Procedures Procedures (including critical care time)  Medications Ordered in ED Medications - No data to display   Initial Impression / Assessment and Plan / ED Course  I have reviewed the triage vital signs and the nursing notes.  Pertinent labs & imaging results that were available during my care of the patient were reviewed by me and considered in my medical decision making (see chart for details).  Clinical Course    60 y.o F presents to the ED today c/o left wrist pain after a fall that occurred yesterday. Pt states she also fell 2 week ago on the wrist and had normal xray of it but the pain worsened yesterday when she fell again. 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 NO obvious bony deformity on exam or swelling. Pt is an established pt of orthopedic. Pt was given brace in ED. Recommend f/u with ortho. Return precautions outlined in patient discharge instructions.     Final Clinical Impressions(s) / ED Diagnoses   Final diagnoses:  Left wrist pain    New Prescriptions New Prescriptions   No medications on file  I personally performed the services described in this documentation, which was scribed in my presence. The recorded information has been reviewed and is accurate.      Lester Kinsman [employer],  PA-C 06/19/16 1401    Azalia Bilis, MD 06/22/16 [phone]

## 2016-06-17 NOTE — ED Triage Notes (Signed)
Pt. fell at home yesterday injured her left wrist with pain and mild swelling .

## 2016-06-18 ENCOUNTER — Emergency Department (HOSPITAL_COMMUNITY): Payer: Medicaid Other

## 2016-06-18 NOTE — ED Notes (Signed)
Patient able to ambulate independently  

## 2016-06-18 NOTE — Discharge Instructions (Signed)
Follow up with your orthopedic provider for re-evaluation. Wear brace daily. Take home pain medication as needed. Return to the ED if you experience increased redness or swelling around your wrist, fevers or chills.

## 2016-07-09 ENCOUNTER — Encounter (HOSPITAL_COMMUNITY): Payer: Self-pay | Admitting: Emergency Medicine

## 2016-07-09 ENCOUNTER — Emergency Department (HOSPITAL_COMMUNITY): Payer: Medicaid Other

## 2016-07-09 ENCOUNTER — Emergency Department (HOSPITAL_COMMUNITY)
Admission: EM | Admit: 2016-07-09 | Discharge: 2016-07-09 | Disposition: A | Discharge status: Home / Self Care | Payer: Medicaid Other | Attending: Emergency Medicine | Admitting: Emergency Medicine

## 2016-07-09 DIAGNOSIS — R0789 Other chest pain: Secondary | ICD-10-CM | POA: Insufficient documentation

## 2016-07-09 DIAGNOSIS — R071 Chest pain on breathing: Secondary | ICD-10-CM | POA: Diagnosis present

## 2016-07-09 DIAGNOSIS — Z79899 Other long term (current) drug therapy: Secondary | ICD-10-CM | POA: Diagnosis not present

## 2016-07-09 LAB — BASIC METABOLIC PANEL
Anion gap: 10 (ref 5–15)
BUN: 8 mg/dL (ref 6–20)
CO2: 27 mmol/L (ref 22–32)
Calcium: 9.7 mg/dL (ref 8.9–10.3)
Chloride: 101 mmol/L (ref 101–111)
Creatinine, Ser: 0.73 mg/dL (ref 0.44–1.00)
GFR calc Af Amer: >60 mL/min (ref >60)
GFR calc non Af Amer: >60 mL/min (ref >60)
Glucose, Bld: 95 mg/dL (ref 65–99)
Potassium: 3.5 mmol/L (ref 3.5–5.1)
Sodium: 138 mmol/L (ref 135–145)

## 2016-07-09 LAB — CBC
HCT: 40.7 % (ref 36.0–46.0)
Hemoglobin: 12.6 g/dL (ref 12.0–15.0)
MCH: 25.6 pg — ABNORMAL LOW (ref 26.0–34.0)
MCHC: 31 g/dL (ref 30.0–36.0)
MCV: 82.6 fL (ref 78.0–100.0)
Platelets: 294 10*3/uL (ref 150–400)
RBC: 4.93 MIL/uL (ref 3.87–5.11)
RDW: 13.9 % (ref 11.5–15.5)
WBC: 9.7 10*3/uL (ref 4.0–10.5)

## 2016-07-09 LAB — I-STAT TROPONIN, ED
Troponin i, poc: 0 ng/mL (ref 0.00–0.08)
Troponin i, poc: 0 ng/mL (ref 0.00–0.08)

## 2016-07-09 LAB — URINALYSIS, ROUTINE W REFLEX MICROSCOPIC
Bilirubin Urine: NEGATIVE
Glucose, UA: NEGATIVE mg/dL
Hgb urine dipstick: NEGATIVE
Ketones, ur: NEGATIVE mg/dL
Nitrite: NEGATIVE
Protein, ur: NEGATIVE mg/dL
Specific Gravity, Urine: 1.011 (ref 1.005–1.030)
pH: 7 (ref 5.0–8.0)

## 2016-07-09 LAB — URINE MICROSCOPIC-ADD ON
Bacteria, UA: NONE SEEN
RBC / HPF: NONE SEEN RBC/hpf (ref 0–5)

## 2016-07-09 MED ORDER — ONDANSETRON 4 MG PO TBDP
4.0000 mg | ORAL_TABLET | Freq: Once | ORAL | Status: AC
  Administered 2016-07-09: 4 mg via ORAL
  Filled 2016-07-09: qty 1

## 2016-07-09 NOTE — ED Provider Notes (Signed)
[facility] Provider Note   CSN: [id number] Arrival date & time: 07/09/16  4098     History   Chief Complaint Chief Complaint  Patient presents with  . Chest Pain    HPI Felicia Collier is a 60 y.o. female.  The history is provided by the patient. No language interpreter was used.  Chest Pain   This is a new problem. The problem occurs constantly. The problem has been gradually worsening. The pain is associated with exertion. The pain is moderate. The quality of the pain is described as brief. The pain does not radiate. Duration of episode(s) is 2 weeks. Associated symptoms include cough. Pertinent negatives include no abdominal pain and no sputum production. She has tried nothing for the symptoms. There are no known risk factors.  Procedure history is negative for cardiac catheterization.  Pt reports she has had some pain in her chest for over a week.   Pt reports multiple areas of discomfort. Pt reports feeling bad all over. Pt reports legs ache, arms ache.  Pt reports she is taking all of her medications Past Medical History:  Diagnosis Date  . Chronic knee pain   . Schizophrenia (HCC)     There are no active problems to display for this patient.   Past Surgical History:  Procedure Laterality Date  . CHOLECYSTECTOMY      OB History    No data available       Home Medications    Prior to Admission medications   Medication Sig Start Date End Date Taking? Authorizing Provider  cetirizine (ZYRTEC ALLERGY) 10 MG tablet Take 1 tablet (10 mg total) by mouth daily. Patient not taking: Reported on 06/07/2016 05/24/16   [name], NP  gabapentin (NEURONTIN) 300 MG capsule Take 1 capsule (300 mg total) by mouth every 8 (eight) hours. Take 1 tablet on day 1. Take 1 tablet every 12 hours on day 2. Take 1 tablet every 8 hours on day 3 and every day thereafter. Patient not taking: Reported on 06/07/2016 05/24/16   [name], NP  hydrocortisone (ANUSOL-HC) 2.5  % rectal cream Apply rectally 2 times daily Patient not taking: Reported on 05/24/2016 04/19/16   [name], PA  pantoprazole (PROTONIX) 40 MG tablet Take 1 tablet (40 mg total) by mouth daily. Patient not taking: Reported on 07/09/2016 05/24/16   [name], NP  traMADol (ULTRAM) 50 MG tablet Take 1 tablet (50 mg total) by mouth every 6 (six) hours as needed for severe pain. Patient not taking: Reported on 07/09/2016 06/07/16   [name], PA-C    Family History No family history on file.  Social History Social History  Substance Use Topics  . Smoking status: Never Smoker  . Smokeless tobacco: Never Used  . Alcohol use No     Allergies   Review of patient's allergies indicates no known allergies.   Review of Systems Review of Systems  Respiratory: Positive for cough. Negative for sputum production.   Cardiovascular: Positive for chest pain.  Gastrointestinal: Negative for abdominal pain.  All other systems reviewed and are negative.    Physical Exam Updated Vital Signs BP 118/95 (BP Location: Right Arm)   Pulse (!) 48   Temp 97.8 F (36.6 C) (Oral)   Resp 19   SpO2 100%   Physical Exam  Constitutional: She appears well-developed and well-nourished. No distress.  HENT:  Head: Normocephalic and atraumatic.  Nose: Nose normal.  Mouth/Throat: Oropharynx is clear and  moist.  Eyes: Conjunctivae are normal.  Neck: Neck supple.  Cardiovascular: Normal rate and regular rhythm.   No murmur heard. Pulmonary/Chest: Effort normal and breath sounds normal. No respiratory distress.  Abdominal: Soft. There is no tenderness.  Musculoskeletal: She exhibits no edema.  Neurological: She is alert.  Skin: Skin is warm and dry.  Psychiatric: She has a normal mood and affect.  Nursing note and vitals reviewed.    ED Treatments / Results  Labs (all labs ordered are listed, but only abnormal results are displayed) Labs Reviewed  CBC - Abnormal; Notable for the  following:       Result Value   MCH 25.6 (*)    All other components within normal limits  URINALYSIS, ROUTINE W REFLEX MICROSCOPIC (NOT AT [facility]) - Abnormal; Notable for the following:    Leukocytes, UA TRACE (*)    All other components within normal limits  URINE MICROSCOPIC-ADD ON - Abnormal; Notable for the following:    Squamous Epithelial / LPF 0-5 (*)    All other components within normal limits  BASIC METABOLIC PANEL  I-STAT TROPOININ, ED    EKG  EKG Interpretation None       Radiology Dg Chest 2 View  Result Date: 07/09/2016 CLINICAL DATA:  Mid chest pain and shortness of breath. EXAM: CHEST  2 VIEW COMPARISON:  Radiographs 03/06/2016.  Chest CT 02/28/2016 FINDINGS: The cardiomediastinal contours are normal. The lungs are clear. Pulmonary vasculature is normal. No consolidation, pleural effusion, or pneumothorax. No acute osseous abnormalities are seen. IMPRESSION: No acute pulmonary process. Electronically Signed   By: Rubye OaksMelanie  Ehinger M.D.   On: 07/09/2016 06:49    Procedures Procedures (including critical care time)  Medications Ordered in ED Medications  ondansetron (ZOFRAN-ODT) disintegrating tablet 4 mg (4 mg Oral Given 07/09/16 [id number])     Initial Impression / Assessment and Plan / ED Course  I have reviewed the triage vital signs and the nursing notes.  Pertinent labs & imaging results that were available during my care of the patient were reviewed by me and considered in my medical decision making (see chart for details).  Clinical Course  Value Comment By Time  ED EKG within 10 minutes (Reviewed) Elson AreasLeslie K Andrey Mccaskill, PA-C 08/25 [id number]  ED EKG within 10 minutes (Reviewed) Elson AreasLeslie K Arnez Stoneking, PA-C 08/25 [id number]   EKg shows sinus bradycardia. No st changes.   Troponin is negative.  Pt reevaluated.  Pt reports she does not feel she needs anything for pain.  She wants to go home.    Final Clinical Impressions(s) / ED Diagnoses   Final diagnoses:  Atypical chest pain     New Prescriptions New Prescriptions   No medications on file  An After Visit Summary was printed and given to the patient.   Lonia SkinnerLeslie K FaithSofia, PA-C 07/09/16 1110    Loren Raceravid Yelverton, MD 07/14/16 1240

## 2016-07-09 NOTE — ED Notes (Signed)
Ambulated pt to the bathroom and back to her bed.  She tolerated well.

## 2016-07-09 NOTE — ED Notes (Signed)
Pt.in room dressed to a gown  On the monitor

## 2016-07-09 NOTE — ED Triage Notes (Signed)
Pt. reports intermittent central chest pain with mild SOB and occasional nausea onset last week , denies diaphoresis / no fever or cough .

## 2016-07-12 ENCOUNTER — Emergency Department (HOSPITAL_COMMUNITY)
Admission: EM | Admit: 2016-07-12 | Discharge: 2016-07-12 | Disposition: A | Discharge status: Home / Self Care | Payer: Medicaid Other | Attending: Physician Assistant | Admitting: Physician Assistant

## 2016-07-12 ENCOUNTER — Encounter (HOSPITAL_COMMUNITY): Payer: Self-pay | Admitting: Emergency Medicine

## 2016-07-12 DIAGNOSIS — B309 Viral conjunctivitis, unspecified: Secondary | ICD-10-CM | POA: Diagnosis not present

## 2016-07-12 DIAGNOSIS — G8929 Other chronic pain: Secondary | ICD-10-CM | POA: Diagnosis not present

## 2016-07-12 DIAGNOSIS — M549 Dorsalgia, unspecified: Secondary | ICD-10-CM

## 2016-07-12 DIAGNOSIS — H5711 Ocular pain, right eye: Secondary | ICD-10-CM | POA: Diagnosis present

## 2016-07-12 DIAGNOSIS — M545 Low back pain: Secondary | ICD-10-CM | POA: Diagnosis not present

## 2016-07-12 DIAGNOSIS — Z79899 Other long term (current) drug therapy: Secondary | ICD-10-CM | POA: Insufficient documentation

## 2016-07-12 MED ORDER — IBUPROFEN 400 MG PO TABS
600.0000 mg | ORAL_TABLET | Freq: Once | ORAL | Status: AC
  Administered 2016-07-12: 600 mg via ORAL
  Filled 2016-07-12: qty 1

## 2016-07-12 MED ORDER — TETRACAINE HCL 0.5 % OP SOLN
2.0000 [drp] | Freq: Once | OPHTHALMIC | Status: AC
  Administered 2016-07-12: 2 [drp] via OPHTHALMIC
  Filled 2016-07-12: qty 2

## 2016-07-12 MED ORDER — NAPHAZOLINE-PHENIRAMINE 0.025-0.3 % OP SOLN: 1.0000 [drp] | OPHTHALMIC | 0 refills | Status: DC | PRN

## 2016-07-12 MED ORDER — FLUORESCEIN SODIUM 1 MG OP STRP
1.0000 | ORAL_STRIP | Freq: Once | OPHTHALMIC | Status: AC
  Administered 2016-07-12: 1 via OPHTHALMIC
  Filled 2016-07-12: qty 1

## 2016-07-12 MED ORDER — NAPROXEN 500 MG PO TABS: 500.0000 mg | ORAL_TABLET | Freq: Two times a day (BID) | ORAL | 0 refills | Status: DC

## 2016-07-12 NOTE — Discharge Instructions (Signed)
Use eyedrops daily for eye redness. Please wash your hands frequently. Avoid rubbing your eye. Take Naprosyn as needed for back pain. Follow up with your primary care provider as needed. Please keep your scheduled appointment with your orthopedic provider for Sept 7 and discuss your back pain with that provider. Return to the ED if you experience severe worsening of your pain, loss of control of your bowel or bladder, numbness or tingling in both of your lower extremities, change in your vision, fevers or chills.

## 2016-07-12 NOTE — ED Provider Notes (Signed)
[facility]-EMERGENCY DEPT Provider Note   CSN: [id number] Arrival date & time: 07/12/16  0620     History   Chief Complaint Chief Complaint  Patient presents with  . Eye Problem  . Back Pain    HPI Felicia Collier is a 61 y.o. female with a pmhx of schizophrenia, chronic back and knee pain who presents to the ED today c.o right eye pain and bilateral lower back pain. Pt states that around 10pm last night she noticed that her right eye began "stinging". Pt has been rubbing her eye and now it is red. She denies putting anything in her eye. No eye drops. No vision change. Pt does not wear contacts. NO associated discharge, fevers, chills, headache, pain behind eye or with eye movement.  Pt also here c/o bilateral lower back pain. Pt states that this feels like her chronic back pain that she has had for many years. Pain is located across her lower back and is worsened with movement. Pt typically takes aspirin for her pain but states that it has not been working lately. She denies any new trauma or injury. Pt is able to walk without difficulty. She denies any bowel or bladder incontinence, saddle anesthesia.   HPI  Past Medical History:  Diagnosis Date  . Chronic knee pain   . Schizophrenia (HCC)     There are no active problems to display for this patient.   Past Surgical History:  Procedure Laterality Date  . CHOLECYSTECTOMY      OB History    No data available       Home Medications    Prior to Admission medications   Medication Sig Start Date End Date Taking? Authorizing Provider  cetirizine (ZYRTEC ALLERGY) 10 MG tablet Take 1 tablet (10 mg total) by mouth daily. Patient not taking: Reported on 06/07/2016 05/24/16   [name], NP  gabapentin (NEURONTIN) 300 MG capsule Take 1 capsule (300 mg total) by mouth every 8 (eight) hours. Take 1 tablet on day 1. Take 1 tablet every 12 hours on day 2. Take 1 tablet every 8 hours on day 3 and every day thereafter. Patient not  taking: Reported on 06/07/2016 05/24/16   [name], NP  hydrocortisone (ANUSOL-HC) 2.5 % rectal cream Apply rectally 2 times daily Patient not taking: Reported on 05/24/2016 04/19/16   [name], PA  pantoprazole (PROTONIX) 40 MG tablet Take 1 tablet (40 mg total) by mouth daily. Patient not taking: Reported on 07/09/2016 05/24/16   [name], NP  traMADol (ULTRAM) 50 MG tablet Take 1 tablet (50 mg total) by mouth every 6 (six) hours as needed for severe pain. Patient not taking: Reported on 07/09/2016 06/07/16   [name], PA-C    Family History No family history on file.  Social History Social History  Substance Use Topics  . Smoking status: Never Smoker  . Smokeless tobacco: Never Used  . Alcohol use No     Allergies   Review of patient's allergies indicates no known allergies.   Review of Systems Review of Systems  All other systems reviewed and are negative.    Physical Exam Updated Vital Signs BP 119/69 (BP Location: Right Arm)   Pulse 61   Temp 98.2 F (36.8 C)   Resp 18   SpO2 97%   Physical Exam  Constitutional: She is oriented to person, place, and time. She appears well-developed and well-nourished. No distress.  HENT:  Head: Normocephalic and atraumatic.  Eyes:  EOM and lids are normal. Pupils are equal, round, and reactive to light. Lids are everted and swept, no foreign bodies found. Right eye exhibits no discharge. Left eye exhibits no discharge. Right conjunctiva is injected. Right conjunctiva has no hemorrhage. Left conjunctiva is not injected. Left conjunctiva has no hemorrhage. No scleral icterus.  Slit lamp exam:      The right eye shows no corneal abrasion, no corneal flare, no corneal ulcer, no foreign body, no hyphema, no fluorescein uptake and no anterior chamber bulge.  Cardiovascular: Normal rate.   Pulmonary/Chest: Effort normal.  Musculoskeletal:  Mild b/l lubar paraspinal muscle TTP. No midline spinal TTP. FROM of C, T, L  spine. No step offs or obvious bony deformities. Negative SLR.    Neurological: She is alert and oriented to person, place, and time. Coordination normal.  Strength 5/5 throughout. No sensory deficits. No gait abnormality.   Skin: Skin is warm and dry. No rash noted. She is not diaphoretic. No erythema. No pallor.  Psychiatric: She has a normal mood and affect. Her behavior is normal.  Nursing note and vitals reviewed.    ED Treatments / Results  Labs (all labs ordered are listed, but only abnormal results are displayed) Labs Reviewed - No data to display  EKG  EKG Interpretation None       Radiology No results found.  Procedures Procedures (including critical care time)  Medications Ordered in ED Medications  fluorescein ophthalmic strip 1 strip (1 strip Right Eye Given 07/12/16 0727)  tetracaine (PONTOCAINE) 0.5 % ophthalmic solution 2 drop (2 drops Right Eye Given 07/12/16 0727)  ibuprofen (ADVIL,MOTRIN) tablet 600 mg (600 mg Oral Given 07/12/16 0727)     Initial Impression / Assessment and Plan / ED Course  I have reviewed the triage vital signs and the nursing notes.  Pertinent labs & imaging results that were available during my care of the patient were reviewed by me and considered in my medical decision making (see chart for details).  Clinical Course    Patient presentation consistent with viral conjunctivitis.  No purulent discharge, corneal abrasions, entrapment, consensual photophobia, or dendritic staining with fluorescein study.  Presentation non-concerning for iritis, bacterial conjunctivitis, corneal abrasions, or HSV.  No antibiotics are indicated and patient will be prescribed naphazoline for itching.  Personal hygiene and frequent handwashing discussed.  Patient advised to followup with ophthalmologist if symptoms persist or worsen in any way including vision change or purulent discharge.   Pt also seen today for exacerbation of chronic lower back pain.  No neurological deficits on exam. No red flag signs or symptoms. Pt ambulatory in the ED without difficulty. Pt given ibuprofen in the ED with symptomatic relief. Will d/c with rx for Naprosyn. Pt has scheduled follow up with ortho on 9/7. Return precautions outlined in patient discharge instructions.   Patient verbalizes understanding and is agreeable with discharge.   Final Clinical Impressions(s) / ED Diagnoses   Final diagnoses:  Chronic back pain  Viral conjunctivitis    New Prescriptions Discharge Medication List as of 07/12/2016  8:09 AM    START taking these medications   Details  naphazoline-pheniramine (NAPHCON-A) 0.025-0.3 % ophthalmic solution Place 1 drop into the right eye every 4 (four) hours as needed for irritation., Starting Mon 07/12/2016, Print    naproxen (NAPROSYN) 500 MG tablet Take 1 tablet (500 mg total) by mouth 2 (two) times daily., Starting Mon 07/12/2016, Print         [employer] InstrumentsSamantha Tripp Dowless, PA-C  07/12/16 0919    Courteney Lyn Mackuen, MD 07/20/16 1610

## 2016-07-12 NOTE — ED Triage Notes (Signed)
Pt states that her right eye is aching. Started this morning. Pt also c/o of lower bilateral back pain. Denies injury and urinary symptoms

## 2016-07-30 ENCOUNTER — Ambulatory Visit: Payer: Medicaid Other

## 2016-08-16 DIAGNOSIS — M25532 Pain in left wrist: Secondary | ICD-10-CM

## 2016-08-18 ENCOUNTER — Emergency Department (HOSPITAL_COMMUNITY)
Admission: EM | Admit: 2016-08-18 | Discharge: 2016-08-19 | Disposition: A | Discharge status: Home / Self Care | Payer: Medicaid Other | Attending: Emergency Medicine | Admitting: Emergency Medicine

## 2016-08-18 DIAGNOSIS — M545 Low back pain, unspecified: Secondary | ICD-10-CM

## 2016-08-18 DIAGNOSIS — M79641 Pain in right hand: Secondary | ICD-10-CM | POA: Insufficient documentation

## 2016-08-18 DIAGNOSIS — Z791 Long term (current) use of non-steroidal anti-inflammatories (NSAID): Secondary | ICD-10-CM | POA: Insufficient documentation

## 2016-08-18 DIAGNOSIS — M25532 Pain in left wrist: Secondary | ICD-10-CM | POA: Diagnosis not present

## 2016-08-18 DIAGNOSIS — M25562 Pain in left knee: Secondary | ICD-10-CM | POA: Insufficient documentation

## 2016-08-18 MED ORDER — TRAMADOL HCL 50 MG PO TABS: 50.0000 mg | ORAL_TABLET | Freq: Four times a day (QID) | ORAL | 0 refills | Status: DC | PRN

## 2016-08-18 MED ORDER — PREDNISONE 20 MG PO TABS
40.0000 mg | ORAL_TABLET | Freq: Once | ORAL | Status: AC
  Administered 2016-08-18: 40 mg via ORAL
  Filled 2016-08-18: qty 2

## 2016-08-18 MED ORDER — METHOCARBAMOL 500 MG PO TABS: 500.0000 mg | ORAL_TABLET | Freq: Two times a day (BID) | ORAL | 0 refills | Status: DC

## 2016-08-18 MED ORDER — PREDNISONE 20 MG PO TABS: 40.0000 mg | ORAL_TABLET | Freq: Every day | ORAL | 0 refills | Status: DC

## 2016-08-18 MED ORDER — CYCLOBENZAPRINE HCL 10 MG PO TABS
10.0000 mg | ORAL_TABLET | Freq: Once | ORAL | Status: AC
  Administered 2016-08-18: 10 mg via ORAL
  Filled 2016-08-18: qty 1

## 2016-08-18 MED ORDER — TRAMADOL HCL 50 MG PO TABS
50.0000 mg | ORAL_TABLET | Freq: Once | ORAL | Status: AC
  Administered 2016-08-18: 50 mg via ORAL
  Filled 2016-08-18: qty 1

## 2016-08-18 NOTE — ED Triage Notes (Signed)
Pt presents via EMS stating that she has chronic back pain, bilateral hand and L knee pain. States that she had injections in her hands and knee this past week but is still having pain. Alert and oriented.

## 2016-08-18 NOTE — ED Provider Notes (Signed)
[facility]-EMERGENCY DEPT Provider Note   CSN: [id number] Arrival date & time: 08/18/16  2221     History   Chief Complaint Chief Complaint  Patient presents with  . Back Pain  . Hand Pain  . Knee Pain    HPI Felicia Collier is a 60 y.o. female.  HPI   Pt presents to the ER with bilateral wrist pain, bilateral finger pain, left knee pain, and low back pain with associated swelling and muscle spasms.  Pain currently rated 8/10, sharp, stabing and cramping.  She denies any injury, reports that she frequently has multiple areas of joint pain and does have chronic back pain.  She denies fever, chills, sweats, nausea, vomiting, abdominal pain, flank pain.  Past Medical History:  Diagnosis Date  . Chronic knee pain   . Schizophrenia (HCC)     There are no active problems to display for this patient.   Past Surgical History:  Procedure Laterality Date  . CHOLECYSTECTOMY      OB History    No data available       Home Medications    Prior to Admission medications   Medication Sig Start Date End Date Taking? Authorizing Provider  cetirizine (ZYRTEC ALLERGY) 10 MG tablet Take 1 tablet (10 mg total) by mouth daily. Patient not taking: Reported on 06/07/2016 05/24/16   [name], NP  gabapentin (NEURONTIN) 300 MG capsule Take 1 capsule (300 mg total) by mouth every 8 (eight) hours. Take 1 tablet on day 1. Take 1 tablet every 12 hours on day 2. Take 1 tablet every 8 hours on day 3 and every day thereafter. Patient not taking: Reported on 06/07/2016 05/24/16   [name], NP  hydrocortisone (ANUSOL-HC) 2.5 % rectal cream Apply rectally 2 times daily Patient not taking: Reported on 05/24/2016 04/19/16   [name], PA  naphazoline-pheniramine (NAPHCON-A) 0.025-0.3 % ophthalmic solution Place 1 drop into the right eye every 4 (four) hours as needed for irritation. 07/12/16   Samantha Tripp Dowless, PA-C  naproxen (NAPROSYN) 500 MG tablet Take 1 tablet (500 mg total) by mouth  2 (two) times daily. 07/12/16   Samantha Tripp Dowless, PA-C  pantoprazole (PROTONIX) 40 MG tablet Take 1 tablet (40 mg total) by mouth daily. Patient not taking: Reported on 07/09/2016 05/24/16   [name], NP  traMADol (ULTRAM) 50 MG tablet Take 1 tablet (50 mg total) by mouth every 6 (six) hours as needed for severe pain. Patient not taking: Reported on 07/09/2016 06/07/16   [name], PA-C    Family History No family history on file.  Social History Social History  Substance Use Topics  . Smoking status: Never Smoker  . Smokeless tobacco: Never Used  . Alcohol use No     Allergies   Review of patient's allergies indicates no known allergies.   Review of Systems Review of Systems  All other systems reviewed and are negative.    Physical Exam Updated Vital Signs BP 124/66 (BP Location: Left Arm)   Pulse 66   Temp 98.1 F (36.7 C) (Oral)   Resp 16   SpO2 99%   Physical Exam  Constitutional: She appears well-developed and well-nourished. No distress.  Pulmonary/Chest: Effort normal and breath sounds normal. No respiratory distress. She has no wheezes. She has no rales. She exhibits no tenderness.  Abdominal: Soft. Bowel sounds are normal. She exhibits no distension and no mass. There is no tenderness. There is no guarding.  No CVA tenderness  Musculoskeletal: She exhibits edema and tenderness. She exhibits no deformity.  Left wrist radial aspect with mild edema/effusion, ttp, no erythema Right hand normal appearing, no deformity, no edema, no erythema, normal ROM Left knee with effusion, ttp, no warmth, no erythema, normal ROM No midline tenderness from C to L spine, no step off, no paraspinal muscle tenderness, no muscle spasms observed or palpated  Skin: Skin is warm. Capillary refill takes less than 2 seconds. No rash noted. She is not diaphoretic. No erythema. No pallor.  Psychiatric: She has a normal mood and affect. Her behavior is normal. Judgment and  thought content normal.     ED Treatments / Results  Labs (all labs ordered are listed, but only abnormal results are displayed) Labs Reviewed - No data to display  EKG  EKG Interpretation None       Radiology No results found.  Procedures Procedures (including critical care time)  Medications Ordered in ED Medications - No data to display   Initial Impression / Assessment and Plan / ED Course  I have reviewed the triage vital signs and the nursing notes.  Pertinent labs & imaging results that were available during my care of the patient were reviewed by me and considered in my medical decision making (see chart for details).  Clinical Course  Patient with multiple complaints of several areas of joint pain and swelling, these all appear to be chronic, no acute injuries, no concerning areas of erythema.  History is somewhat limited by patient's psychiatric illness.  Patient is well-appearing, afebrile with stable vital signs.  Will give steroid burst, muscle relaxers and tramadol for pain.  She does see providers for this chronic joint pain, she is encouraged to follow up with with them within the next week. Return precautions reviewed. Patient verbalized understanding and was discharged home in good condition with stable vital signs   Final Clinical Impressions(s) / ED Diagnoses   Final diagnoses:  Left wrist pain  Right hand pain  Low back pain without sciatica, unspecified back pain laterality, unspecified chronicity  Left knee pain, unspecified chronicity    New Prescriptions Discharge Medication List as of 08/18/2016 11:39 PM    START taking these medications   Details  methocarbamol (ROBAXIN) 500 MG tablet Take 1 tablet (500 mg total) by mouth 2 (two) times daily., Starting Wed 08/18/2016, Print    predniSONE (DELTASONE) 20 MG tablet Take 2 tablets (40 mg total) by mouth daily. Take 40 mg by mouth daily for 3 days, then 20mg  by mouth daily for 3 days, then 10mg   daily for 3 days, Starting Wed 08/18/2016, Print    !! traMADol (ULTRAM) 50 MG tablet Take 1 tablet (50 mg total) by mouth every 6 (six) hours as needed., Starting Wed 08/18/2016, Print     !! - Potential duplicate medications found. Please discuss with provider.       Danelle BerryLeisa Timmey Lamba, PA-C 08/28/16 1008    Vanetta MuldersScott Zackowski, MD 09/02/16 1551

## 2016-08-31 ENCOUNTER — Encounter (HOSPITAL_COMMUNITY): Payer: Self-pay | Admitting: Emergency Medicine

## 2016-08-31 DIAGNOSIS — N390 Urinary tract infection, site not specified: Secondary | ICD-10-CM | POA: Insufficient documentation

## 2016-08-31 DIAGNOSIS — M79604 Pain in right leg: Secondary | ICD-10-CM | POA: Insufficient documentation

## 2016-08-31 DIAGNOSIS — M79603 Pain in arm, unspecified: Secondary | ICD-10-CM | POA: Diagnosis not present

## 2016-08-31 NOTE — ED Triage Notes (Signed)
Pt. reports bilateral thigh pain onset yesterday denies injury , pain radiating to lower back , ambulatory .

## 2016-09-01 ENCOUNTER — Emergency Department (HOSPITAL_COMMUNITY)
Admission: EM | Admit: 2016-09-01 | Discharge: 2016-09-01 | Disposition: A | Discharge status: Home / Self Care | Payer: Medicaid Other | Attending: Emergency Medicine | Admitting: Emergency Medicine

## 2016-09-01 DIAGNOSIS — M79604 Pain in right leg: Secondary | ICD-10-CM

## 2016-09-01 DIAGNOSIS — N390 Urinary tract infection, site not specified: Secondary | ICD-10-CM

## 2016-09-01 DIAGNOSIS — M79605 Pain in left leg: Secondary | ICD-10-CM

## 2016-09-01 LAB — URINE MICROSCOPIC-ADD ON

## 2016-09-01 LAB — URINALYSIS, ROUTINE W REFLEX MICROSCOPIC
BILIRUBIN URINE: NEGATIVE
Glucose, UA: NEGATIVE mg/dL
HGB URINE DIPSTICK: NEGATIVE
KETONES UR: NEGATIVE mg/dL
NITRITE: NEGATIVE
PROTEIN: NEGATIVE mg/dL
Specific Gravity, Urine: 1.007 (ref 1.005–1.030)
pH: 7 (ref 5.0–8.0)

## 2016-09-01 MED ORDER — CYCLOBENZAPRINE HCL 10 MG PO TABS
5.0000 mg | ORAL_TABLET | Freq: Once | ORAL | Status: AC
  Administered 2016-09-01: 5 mg via ORAL
  Filled 2016-09-01: qty 1

## 2016-09-01 MED ORDER — CYCLOBENZAPRINE HCL 5 MG PO TABS: 5.0000 mg | ORAL_TABLET | Freq: Two times a day (BID) | ORAL | 0 refills | Status: DC | PRN

## 2016-09-01 MED ORDER — SULFAMETHOXAZOLE-TRIMETHOPRIM 800-160 MG PO TABS: 1.0000 | ORAL_TABLET | Freq: Two times a day (BID) | ORAL | 0 refills | Status: AC

## 2016-09-02 LAB — URINE CULTURE

## 2016-09-12 NOTE — ED Provider Notes (Signed)
[facility]-EMERGENCY DEPT Provider Note   CSN: [id number] Arrival date & time: 08/31/16  2227  History   Chief Complaint Chief Complaint  Patient presents with  . Leg Pain    HPI Felicia Collier is a 60 y.o. female.  HPI  Patient comes to the ED with PMH of schizophrenia and chronic knee pain. He is having bilateral thigh pain and low back pain. He describes the pain as aching and it started yesterday. He is not having any difficulty walking. The pain is exacerbated by certain movements. He denies having any sensation of numbness or weakness to either leg. He denies having any loss of bowel or bladder control, using IV drugs, recent trauma, fevers, headaches. He denies chronic back pain but admits he has had similar pain to this in the past.  Past Medical History:  Diagnosis Date  . Chronic knee pain   . Schizophrenia (HCC)     There are no active problems to display for this patient.   Past Surgical History:  Procedure Laterality Date  . CHOLECYSTECTOMY      OB History    No data available       Home Medications    Prior to Admission medications   Medication Sig Start Date End Date Taking? Authorizing Provider  cetirizine (ZYRTEC ALLERGY) 10 MG tablet Take 1 tablet (10 mg total) by mouth daily. Patient not taking: Reported on 09/01/2016 05/24/16   [name], NP  cyclobenzaprine (FLEXERIL) 5 MG tablet Take 1 tablet (5 mg total) by mouth 2 (two) times daily as needed. 09/01/16   Daffney Greenly Neva Seat, PA-C  gabapentin (NEURONTIN) 300 MG capsule Take 1 capsule (300 mg total) by mouth every 8 (eight) hours. Take 1 tablet on day 1. Take 1 tablet every 12 hours on day 2. Take 1 tablet every 8 hours on day 3 and every day thereafter. Patient not taking: Reported on 09/01/2016 05/24/16   [name], NP  hydrocortisone (ANUSOL-HC) 2.5 % rectal cream Apply rectally 2 times daily Patient not taking: Reported on 09/01/2016 04/19/16   [name], PA  methocarbamol (ROBAXIN) 500  MG tablet Take 1 tablet (500 mg total) by mouth 2 (two) times daily. Patient not taking: Reported on 09/01/2016 08/18/16   Danelle Berry, PA-C  naphazoline-pheniramine (NAPHCON-A) 0.025-0.3 % ophthalmic solution Place 1 drop into the right eye every 4 (four) hours as needed for irritation. Patient not taking: Reported on 09/01/2016 07/12/16   Samantha Tripp Dowless, PA-C  naproxen (NAPROSYN) 500 MG tablet Take 1 tablet (500 mg total) by mouth 2 (two) times daily. Patient not taking: Reported on 09/01/2016 07/12/16   Samantha Tripp Dowless, PA-C  pantoprazole (PROTONIX) 40 MG tablet Take 1 tablet (40 mg total) by mouth daily. Patient not taking: Reported on 09/01/2016 05/24/16   [name], NP  predniSONE (DELTASONE) 20 MG tablet Take 2 tablets (40 mg total) by mouth daily. Take 40 mg by mouth daily for 3 days, then 20mg  by mouth daily for 3 days, then 10mg  daily for 3 days Patient not taking: Reported on 09/01/2016 08/18/16   Danelle Berry, PA-C  traMADol (ULTRAM) 50 MG tablet Take 1 tablet (50 mg total) by mouth every 6 (six) hours as needed for severe pain. Patient not taking: Reported on 09/01/2016 06/07/16   [name], PA-C  traMADol (ULTRAM) 50 MG tablet Take 1 tablet (50 mg total) by mouth every 6 (six) hours as needed. Patient not taking: Reported on 09/01/2016 08/18/16   Danelle Berry,  PA-C    Family History No family history on file.  Social History Social History  Substance Use Topics  . Smoking status: Never Smoker  . Smokeless tobacco: Never Used  . Alcohol use No     Allergies   Review of patient's allergies indicates no known allergies.   Review of Systems Review of Systems  Review of Systems All other systems negative except as documented in the HPI. All pertinent positives and negatives as reviewed in the HPI.  Physical Exam Updated Vital Signs BP 154/79 (BP Location: Right Arm)   Pulse (!) 56   Temp 98.1 F (36.7 C) (Oral)   Resp 18   Ht 5\' 3"  (1.6 m)   Wt  73.8 kg   SpO2 96%   BMI 28.84 kg/m   Physical Exam  Constitutional: She appears well-developed and well-nourished. No distress.  HENT:  Head: Normocephalic and atraumatic.  Eyes: Pupils are equal, round, and reactive to light.  Neck: Normal range of motion. Neck supple.  Cardiovascular: Normal rate and regular rhythm.   Pulmonary/Chest: Effort normal.  Abdominal: Soft. Bowel sounds are normal. There is no tenderness. There is no rigidity, no rebound, no guarding and no CVA tenderness.  Musculoskeletal:  Symmetrical and physiologic strength to bilateral lower extremities.  Neurosensory function adequate to both legs Skin color is normal. Skin is warm and moist.  No step off deformity appreciated and no midline bony tenderness.  Ambulatory  No crepitus, laceration, effusion, induration, lesions Pedal pulses are symmetrical and palpable bilaterally   No tenderness to midline, positive tenderness to paraspinal muscles to low lumbar region No clonus on dorsiflextion   Neurological: She is alert.  Skin: Skin is warm and dry.  Nursing note and vitals reviewed.    ED Treatments / Results  Labs (all labs ordered are listed, but only abnormal results are displayed) Labs Reviewed  URINE CULTURE - Abnormal; Notable for the following:       Result Value   Culture MULTIPLE SPECIES PRESENT, SUGGEST RECOLLECTION (*)    All other components within normal limits  URINALYSIS, ROUTINE W REFLEX MICROSCOPIC (NOT [facility]) - Abnormal; Notable for the following:    Color, Urine STRAW (*)    Leukocytes, UA MODERATE (*)    All other components within normal limits  URINE MICROSCOPIC-ADD ON - Abnormal; Notable for the following:    Squamous Epithelial / LPF 0-5 (*)    Bacteria, UA FEW (*)    All other components within normal limits    EKG  EKG Interpretation None       Radiology No results found.  Procedures Procedures (including critical care time)  Medications Ordered in  ED Medications  cyclobenzaprine (FLEXERIL) tablet 5 mg (5 mg Oral Given 09/01/16 0516)     Initial Impression / Assessment and Plan / ED Course  I have reviewed the triage vital signs and the nursing notes.  Pertinent labs & imaging results that were available during my care of the patient were reviewed by me and considered in my medical decision making (see chart for details).  Clinical Course  Value Comment By Time  Culture: (!) MULTIPLE SPECIES PRESENT, SUGGEST RECOLLECTION (Reviewed) Marlon Peliffany Shalay Carder, PA-C 11/01 1622  Culture: (!) MULTIPLE SPECIES PRESENT, SUGGEST RECOLLECTION (Reviewed) Marlon Peliffany Zach Tietje, PA-C 11/01 1622  Culture: (!) MULTIPLE SPECIES PRESENT, SUGGEST RECOLLECTION (Reviewed) Marlon Peliffany Lauryl Seyer, PA-C 11/01 1622    Patient with moderate leukocytes in his urine at todays visit. No hematuria to suggest kidney stone. THe patient  is not having any urinary symptoms or penile discharge. No testicular pain. I think the urine is contaminated, will start on Bactrim and send out for urine culture.  Patient started on NSAIDs and muscle relaxer for low back pain, [facility]  Final Clinical Impressions(s) / ED Diagnoses   Final diagnoses:  Urinary tract infection without hematuria, site unspecified  Leg pain, bilateral    New Prescriptions Discharge Medication List as of 09/01/2016  5:03 AM    START taking these medications   Details  cyclobenzaprine (FLEXERIL) 5 MG tablet Take 1 tablet (5 mg total) by mouth 2 (two) times daily as needed., Starting Wed 09/01/2016, Print    sulfamethoxazole-trimethoprim (BACTRIM DS,SEPTRA DS) 800-160 MG tablet Take 1 tablet by mouth 2 (two) times daily., Starting Wed 09/01/2016, Until Wed 09/08/2016, Print         Marlon Peliffany Traveion Ruddock, PA-C 09/16/16 0109    Zadie Rhineonald Wickline, MD 09/19/16 1540

## 2016-09-26 ENCOUNTER — Emergency Department (HOSPITAL_COMMUNITY): Payer: Medicaid Other

## 2016-09-26 ENCOUNTER — Emergency Department (HOSPITAL_COMMUNITY)
Admission: EM | Admit: 2016-09-26 | Discharge: 2016-09-26 | Disposition: A | Discharge status: Home / Self Care | Payer: Medicaid Other | Attending: Emergency Medicine | Admitting: Emergency Medicine

## 2016-09-26 ENCOUNTER — Encounter (HOSPITAL_COMMUNITY): Payer: Self-pay | Admitting: *Deleted

## 2016-09-26 DIAGNOSIS — M1712 Unilateral primary osteoarthritis, left knee: Secondary | ICD-10-CM

## 2016-09-26 DIAGNOSIS — M25562 Pain in left knee: Secondary | ICD-10-CM | POA: Diagnosis present

## 2016-09-26 NOTE — ED Triage Notes (Signed)
Pt c/o L knee pain since Thursday. Denies injury to knee; has had surgery ~4 months ago to same knee. Pt is using one crutch to ambulate although noted to have steady gait without use of crutch

## 2016-09-26 NOTE — ED Provider Notes (Signed)
[facility]-EMERGENCY DEPT Provider Note   CSN: [id number] Arrival date & time: 09/26/16  2133   By signing my name below, I, Clarisse GougeXavier Herndon, attest that this documentation has been prepared under the direction and in the presence of [facility] Ina Scrivens. Electronically Signed: Clarisse GougeXavier Herndon, Scribe. 09/26/16. 10:13 PM.   History   Chief Complaint Chief Complaint  Patient presents with  . Knee Pain   The history is provided by the patient. No language interpreter was used.   HPI Comments: Felicia Collier is a 60 y.o. female with a Hx of left knee surgery who presents to the Emergency Department complaining of sudden onset, unchanged, constant left knee pain x 4 days. She describes her knee pain as "pulling all over" reports associated swelling. Pain increases with walking. Pt denies any new injury to the knee. She has an appointment with her orthopedic doctor next week. She is using a crutch to assist with ambulation.     Past Medical History:  Diagnosis Date  . Chronic knee pain   . Schizophrenia (HCC)     There are no active problems to display for this patient.   Past Surgical History:  Procedure Laterality Date  . CHOLECYSTECTOMY      OB History    No data available       Home Medications    Prior to Admission medications   Medication Sig Start Date End Date Taking? Authorizing Provider  cyclobenzaprine (FLEXERIL) 5 MG tablet Take 1 tablet (5 mg total) by mouth 2 (two) times daily as needed. 09/01/16   Tiffany Neva SeatGreene, PA-C  gabapentin (NEURONTIN) 300 MG capsule Take 1 capsule (300 mg total) by mouth every 8 (eight) hours. Take 1 tablet on day 1. Take 1 tablet every 12 hours on day 2. Take 1 tablet every 8 hours on day 3 and every day thereafter. Patient not taking: Reported on 09/01/2016 05/24/16   Henrietta HooverLinda C Bernhardt, NP  naphazoline-pheniramine (NAPHCON-A) 0.025-0.3 % ophthalmic solution Place 1 drop into the right eye every 4 (four) hours as needed for irritation. Patient not  taking: Reported on 09/01/2016 07/12/16   Samantha Tripp Dowless, PA-C  pantoprazole (PROTONIX) 40 MG tablet Take 1 tablet (40 mg total) by mouth daily. Patient not taking: Reported on 09/01/2016 05/24/16   Henrietta HooverLinda C Bernhardt, NP  predniSONE (DELTASONE) 20 MG tablet Take 2 tablets (40 mg total) by mouth daily. Take 40 mg by mouth daily for 3 days, then 20mg  by mouth daily for 3 days, then 10mg  daily for 3 days Patient not taking: Reported on 09/01/2016 08/18/16   Danelle BerryLeisa Tapia, PA-C    Family History No family history on file.  Social History Social History  Substance Use Topics  . Smoking status: Never Smoker  . Smokeless tobacco: Never Used  . Alcohol use No     Allergies   Patient has no known allergies.   Review of Systems Review of Systems  Musculoskeletal: Positive for arthralgias and joint swelling.  patient denies any other problems.   Physical Exam Updated Vital Signs BP 130/59   Pulse 65   Temp 98.1 F (36.7 C)   Resp 16   Ht 5\' 4"  (1.626 m)   Wt 162 lb (73.5 kg)   SpO2 98%   BMI 27.81 kg/m   Physical Exam  Constitutional: She is oriented to person, place, and time. She appears well-developed and well-nourished. No distress.  HENT:  Head: Normocephalic and atraumatic.  Eyes: EOM are normal.  Neck: Normal range of motion.  Cardiovascular: Normal rate.   Pulses:      Dorsalis pedis pulses are 2+ on the right side, and 2+ on the left side.  Pulmonary/Chest: Effort normal.  Musculoskeletal:       Left knee: She exhibits decreased range of motion. She exhibits no swelling, no ecchymosis, no deformity, no erythema and normal alignment. Tenderness found.       Legs: No calf pain. Flexion without pain. Extension causes pain. She has minimal swelling to the anterior aspect of the knee. Pedal pulse 2+, adequate circulation.   Neurological: She is alert and oriented to person, place, and time.  Skin: Skin is warm and dry.  Psychiatric: She has a normal mood and  affect. Judgment normal.  Nursing note and vitals reviewed.    ED Treatments / Results  DIAGNOSTIC STUDIES: Oxygen Saturation is 98% on RA, normal by my interpretation.    COORDINATION OF CARE: 10:13 PM Discussed treatment plan with pt at bedside and pt agreed to plan.   Labs (all labs ordered are listed, but only abnormal results are displayed) Labs Reviewed - No data to display   Radiology Dg Knee Complete 4 Views Left  Result Date: 09/26/2016 CLINICAL DATA:  Nontraumatic left knee pain for several days. EXAM: LEFT KNEE - COMPLETE 4+ VIEW COMPARISON:  11/12/2015 FINDINGS: Negative for acute fracture or dislocation. Moderately severe arthritic changes are present in the medial compartment, unchanged. Milder patellofemoral degenerative changes are present. No acute soft tissue abnormality. IMPRESSION: Multi compartment arthritic changes.  No acute findings are evident. Electronically Signed   By: Ellery Plunkaniel R Mitchell M.D.   On: 09/26/2016 23:01    Procedures Procedures (including critical care time)  Medications Ordered in ED Medications - No data to display   Initial Impression / Assessment and Plan / ED Course  I have reviewed the triage vital signs and the nursing notes.  Pertinent imaging results that were available during my care of the patient were reviewed by me and considered in my medical decision making (see chart for details).  Clinical Course   60 y.o. female with hx of knee surgery and pain to the left knee stable for d/c without acute finding on x-ray and able to ambulate in the ED prior to d/c. She will f/u with her orthopedic doctor. Knee sleeve applied for comfort and patient will keep her f/u appointment with her doctor.   Final Clinical Impressions(s) / ED Diagnoses   Final diagnoses:  Arthritis of left knee    New Prescriptions Discharge Medication List as of 09/26/2016 11:37 PM    *I personally performed the services described in this documentation,  which was scribed in my presence. The recorded information has been reviewed and is accurate.    [address]Hope WarroadM Alyas Creary, NP 09/27/16 0102    Donnetta HutchingBrian Cook, MD 10/01/16 [phone]

## 2016-09-26 NOTE — Discharge Instructions (Signed)
Continue your home medications, wear the brace for support and comfort and follow up with Dr. Magnus IvanBlackman.

## 2016-09-26 NOTE — ED Notes (Signed)
Pt placed in a gown with pants off and a warm blanket

## 2016-10-12 ENCOUNTER — Telehealth (INDEPENDENT_AMBULATORY_CARE_PROVIDER_SITE_OTHER): Payer: Self-pay | Admitting: *Deleted

## 2016-10-12 ENCOUNTER — Ambulatory Visit (INDEPENDENT_AMBULATORY_CARE_PROVIDER_SITE_OTHER): Payer: Medicaid Other | Admitting: Physician Assistant

## 2016-10-12 DIAGNOSIS — M7052 Other bursitis of knee, left knee: Secondary | ICD-10-CM

## 2016-10-12 DIAGNOSIS — M25532 Pain in left wrist: Secondary | ICD-10-CM

## 2016-10-12 MED ORDER — LIDOCAINE HCL 1 % IJ SOLN
1.0000 mL | INTRAMUSCULAR | Status: AC | PRN
  Administered 2016-10-12: 1 mL

## 2016-10-12 MED ORDER — METHYLPREDNISOLONE ACETATE 40 MG/ML IJ SUSP
40.0000 mg | INTRAMUSCULAR | Status: AC | PRN
  Administered 2016-10-12: 40 mg via INTRAMUSCULAR

## 2016-10-12 NOTE — Telephone Encounter (Signed)
Pt called stating her primary care Dr. Abner GreenspanBlyth needs to know what labs she needs to have done per Dr. Magnus IvanBlackman from her surgery. CB: [phone]: [phone]. Pt stated if Dr. Magnus IvanBlackman needed labs to fax these to Dr. Abner GreenspanBlyth

## 2016-10-12 NOTE — Progress Notes (Signed)
Office Visit Note   Patient: Felicia Collier           Date of Birth: July 13, 1956           MRN: [id number] Visit Date: 10/12/2016              Requested by: No referring provider defined for this encounter. PCP: No PCP Per Patient   Assessment & Plan: Visit Diagnoses:  1. Pes anserinus bursitis of left knee   2. Pain in left wrist     Plan: Pennsaid samples given for left wrist pain apply 1/3 packet 3 times daily. Obtain new wrist splint. Ice to left knee.   Follow-Up Instructions: Return if symptoms worsen or fail to improve.   Orders:  Orders Placed This Encounter  Procedures  . Trigger Point Injection   No orders of the defined types were placed in this encounter.     Procedures: Trigger Point Inj Date/Time: 10/12/2016 10:38 AM Performed by: Kirtland BouchardLARK, Keyasia Jolliff W Authorized by: Kirtland BouchardLARK, Jahmel Flannagan W   Consent Given by:  Patient Site marked: the procedure site was marked   Timeout: prior to procedure the correct patient, procedure, and site was verified   Indications:  Pain Total # of Trigger Points:  1 Location: lower extremity   Needle Size:  22 G Approach:  Medial Medications #1:  1 mL lidocaine 1 %; 40 mg methylPREDNISolone acetate 40 MG/ML     Clinical Data: No additional findings.   Subjective: Chief Complaint  Patient presents with  . Left Wrist - Pain    Patient states her wrist still bothers her   . Left Knee - Pain    Patient had Monovisc injection 08/11/16, states it was some helpful but still has a lot of trouble getting up.     HPI  Felicia Collier returns today follow-up of her left knee status post left knee Monovisc injection 08/11/2016. States knee is still giving him trouble. Should fact went to the ER on 09/26/2016 did knee pain and able to bear weight. 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 No acute fracture seen on the radiographs. I personally reviewed these  films today. Had no new injury to the knee, the pain in her left knee is a new sensation. Describes it as a pulling sensation and points over the anterior proximal tibia. She was given a knee sleeve while in the ER which she is not wearing today. In regards to the left wrist she has chronic ongoing left wrist pain with old radial styloid fracture. She is used the left wrist brace in the past where she reports that this is worn out. No new injury to left wrist.   Review of Systems See HPI  Objective: Vital Signs: There were no vitals taken for this visit.  Physical Exam  Constitutional: She is oriented to person, place, and time. She appears well-developed and well-nourished. No distress.  Neurological: She is alert and oriented to person, place, and time.    Ortho Exam Left knee good range of motion without significant pain. No effusion no abnormal warmth no erythema of the left knee. She has mild tenderness over the medial  joint line and maximal tenderness over the pes anserinus of the left knee. She ambulates without an antalgic gait or assistive device. Bilateral wrist:Radial pulses are present. She has good range of motion of both wrist. Wrist with tenderness over the radial styloid region. No rashes skin  lesions ulcerations erythema of the left wrist. Specialty Comments:  No specialty comments available.  Imaging: No results found.   PMFS History: There are no active problems to display for this patient.  Past Medical History:  Diagnosis Date  . Chronic knee pain   . Schizophrenia (HCC)     No family history on file.  Past Surgical History:  Procedure Laterality Date  . CHOLECYSTECTOMY     Social History   Occupational History  . Not on file.   Social History Main Topics  . Smoking status: Never Smoker  . Smokeless tobacco: Never Used  . Alcohol use No  . Drug use: No  . Sexual activity: Not on file

## 2016-10-12 NOTE — Telephone Encounter (Signed)
There is no labs I am aware of that we need right now.

## 2016-10-12 NOTE — Telephone Encounter (Signed)
Patient aware of message

## 2016-10-12 NOTE — Telephone Encounter (Signed)
See below

## 2016-11-03 ENCOUNTER — Encounter (HOSPITAL_COMMUNITY): Payer: Self-pay | Admitting: Emergency Medicine

## 2016-11-03 ENCOUNTER — Emergency Department (HOSPITAL_COMMUNITY): Payer: Medicaid Other

## 2016-11-03 ENCOUNTER — Emergency Department (HOSPITAL_COMMUNITY)
Admission: EM | Admit: 2016-11-03 | Discharge: 2016-11-04 | Disposition: A | Discharge status: Home / Self Care | Payer: Medicaid Other | Attending: Emergency Medicine | Admitting: Emergency Medicine

## 2016-11-03 DIAGNOSIS — G8929 Other chronic pain: Secondary | ICD-10-CM | POA: Diagnosis not present

## 2016-11-03 DIAGNOSIS — Z79899 Other long term (current) drug therapy: Secondary | ICD-10-CM | POA: Insufficient documentation

## 2016-11-03 DIAGNOSIS — R0789 Other chest pain: Secondary | ICD-10-CM | POA: Diagnosis not present

## 2016-11-03 DIAGNOSIS — R6889 Other general symptoms and signs: Secondary | ICD-10-CM

## 2016-11-03 DIAGNOSIS — M25562 Pain in left knee: Secondary | ICD-10-CM | POA: Diagnosis not present

## 2016-11-03 DIAGNOSIS — R079 Chest pain, unspecified: Secondary | ICD-10-CM | POA: Diagnosis present

## 2016-11-03 LAB — RAPID URINE DRUG SCREEN, HOSP PERFORMED
Amphetamines: NOT DETECTED
BARBITURATES: NOT DETECTED
BENZODIAZEPINES: NOT DETECTED
Cocaine: NOT DETECTED
Opiates: NOT DETECTED
Tetrahydrocannabinol: NOT DETECTED

## 2016-11-03 LAB — BASIC METABOLIC PANEL
Anion gap: 8 (ref 5–15)
BUN: 11 mg/dL (ref 6–20)
CALCIUM: 8.9 mg/dL (ref 8.9–10.3)
CO2: 27 mmol/L (ref 22–32)
Chloride: 103 mmol/L (ref 101–111)
Creatinine, Ser: 0.69 mg/dL (ref 0.44–1.00)
GFR calc Af Amer: >60 mL/min (ref >60)
GLUCOSE: 105 mg/dL — AB (ref 65–99)
POTASSIUM: 3.3 mmol/L — AB (ref 3.5–5.1)
Sodium: 138 mmol/L (ref 135–145)

## 2016-11-03 LAB — ETHANOL: Alcohol, Ethyl (B): <5 mg/dL (ref <5)

## 2016-11-03 LAB — CBC
HEMATOCRIT: 39.3 % (ref 36.0–46.0)
Hemoglobin: 12.6 g/dL (ref 12.0–15.0)
MCH: 25.9 pg — ABNORMAL LOW (ref 26.0–34.0)
MCHC: 32.1 g/dL (ref 30.0–36.0)
MCV: 80.9 fL (ref 78.0–100.0)
Platelets: 273 10*3/uL (ref 150–400)
RBC: 4.86 MIL/uL (ref 3.87–5.11)
RDW: 13.9 % (ref 11.5–15.5)
WBC: 9.7 10*3/uL (ref 4.0–10.5)

## 2016-11-03 LAB — I-STAT TROPONIN, ED: Troponin i, poc: 0 ng/mL (ref 0.00–0.08)

## 2016-11-03 MED ORDER — IBUPROFEN 800 MG PO TABS
800.0000 mg | ORAL_TABLET | Freq: Once | ORAL | Status: AC
  Administered 2016-11-04: 800 mg via ORAL
  Filled 2016-11-03: qty 1

## 2016-11-03 NOTE — Progress Notes (Signed)
Patient noted to have been seen in the ED 9 times within the last six months. Patient listed as having Medicaid [facility] [employer] insurance. [facility] at 1030 am for six month follow up. Patient with hx of schizophrenia per chart review.

## 2016-11-03 NOTE — ED Triage Notes (Signed)
Pt comes by EMS with complaints of right sided chest pain and multiple other complaints about musculoskeletal pain.  States someone told her she didn't have a heart but just had clamps in there and it hurts because it needs to come out. Pt has hx of schizophrenia.

## 2016-11-03 NOTE — ED Provider Notes (Signed)
[facility]-EMERGENCY DEPT Provider Note   CSN: [id number] Arrival date & time: 11/03/16  2142 By signing my name below, I, Felicia Collier, attest that this documentation has been prepared under the direction and in the presence of Dione Boozeavid Dasean Brow, MD. Electronically Signed: Linus GalasMaharshi Collier, ED Scribe. 11/03/16. 11:49 PM.   History   Chief Complaint Chief Complaint  Patient presents with  . Chest Pain  . Medical Clearance   The history is provided by the patient. No language interpreter was used.   HPI Comments: Felicia Collier is a 60 y.o. female who presents to the Emergency Department via EMS with a PMHx of chronic knee pain and schizophrenia complaining of ongoing generalized body aches for the past 2 years, with pain worsening today in the right chest, left wrist and left knee. Her chest pain radiates to the right side of her neck. Her pain in her chest, wrist, and knee is described as a "burning, achy pain." Her pain is a 10/10 in severity. Pt denies any aggravating or alleviating factors. No treatments tried. Pt denies any fevers, chills, SOB, N/V/D or any other symptoms at this time.   Past Medical History:  Diagnosis Date  . Chronic knee pain   . Schizophrenia (HCC)    There are no active problems to display for this patient.  Past Surgical History:  Procedure Laterality Date  . CHOLECYSTECTOMY     OB History    No data available     Home Medications    Prior to Admission medications   Medication Sig Start Date End Date Taking? Authorizing Provider  cyclobenzaprine (FLEXERIL) 5 MG tablet Take 1 tablet (5 mg total) by mouth 2 (two) times daily as needed. Patient not taking: Reported on 10/12/2016 09/01/16   Marlon Peliffany Greene, PA-C  gabapentin (NEURONTIN) 300 MG capsule Take 1 capsule (300 mg total) by mouth every 8 (eight) hours. Take 1 tablet on day 1. Take 1 tablet every 12 hours on day 2. Take 1 tablet every 8 hours on day 3 and every day thereafter. Patient not taking: Reported  on 10/12/2016 05/24/16   Henrietta HooverLinda C Bernhardt, NP  naphazoline-pheniramine (NAPHCON-A) 0.025-0.3 % ophthalmic solution Place 1 drop into the right eye every 4 (four) hours as needed for irritation. Patient not taking: Reported on 10/12/2016 07/12/16   Samantha Tripp Dowless, PA-C  pantoprazole (PROTONIX) 40 MG tablet Take 1 tablet (40 mg total) by mouth daily. Patient not taking: Reported on 10/12/2016 05/24/16   Henrietta HooverLinda C Bernhardt, NP  predniSONE (DELTASONE) 20 MG tablet Take 2 tablets (40 mg total) by mouth daily. Take 40 mg by mouth daily for 3 days, then 20mg  by mouth daily for 3 days, then 10mg  daily for 3 days Patient not taking: Reported on 10/12/2016 08/18/16   Danelle BerryLeisa Tapia, PA-C   Family History No family history on file.  Social History Social History  Substance Use Topics  . Smoking status: Never Smoker  . Smokeless tobacco: Never Used  . Alcohol use No   Allergies   Patient has no known allergies.   Review of Systems Review of Systems  Constitutional: Negative for chills and fever.  Respiratory: Negative for shortness of breath.   Cardiovascular: Positive for chest pain.  Gastrointestinal: Negative for diarrhea, nausea and vomiting.  Musculoskeletal: Positive for arthralgias and myalgias.  All other systems reviewed and are negative.  Physical Exam Updated Vital Signs BP 138/82 (BP Location: Left Arm)   Pulse (!) 52   Temp 98.2 F (36.8 C) (Oral)  Resp 15   Ht 5\' 4"  (1.626 m)   Wt 170 lb (77.1 kg)   SpO2 97%   BMI 29.18 kg/m   Physical Exam  Constitutional: She is oriented to person, place, and time. She appears well-developed and well-nourished.  HENT:  Head: Normocephalic and atraumatic.  Eyes: EOM are normal. Pupils are equal, round, and reactive to light.  Neck: Normal range of motion. Neck supple. No JVD present.  Cardiovascular: Normal rate, regular rhythm and normal heart sounds.   No murmur heard. Pulmonary/Chest: Effort normal and breath sounds normal.  She has no wheezes. She has no rales. She exhibits tenderness.  Moderate tenderness diffusely.  Abdominal: Soft. Bowel sounds are normal. She exhibits no distension and no mass. There is no tenderness.  Musculoskeletal: Normal range of motion. She exhibits tenderness. She exhibits no edema.  Moderate tenderness left wrist and left knee, poorly localized.   Lymphadenopathy:    She has no cervical adenopathy.  Neurological: She is alert and oriented to person, place, and time. No cranial nerve deficit. She exhibits normal muscle tone. Coordination normal.  Skin: Skin is warm and dry. No rash noted.  Psychiatric:  Scattered though process, somewhat rambling speech.   Nursing note and vitals reviewed.  ED Treatments / Results  DIAGNOSTIC STUDIES: Oxygen Saturation is 97% on room air, normal by my interpretation.    COORDINATION OF CARE: 11:55 PM Discussed laboratory results and  treatment plan with pt at bedside including ordering ibuprofen and an EKG. Pt agreed to plan.  Labs (all labs ordered are listed, but only abnormal results are displayed) Labs Reviewed  BASIC METABOLIC PANEL - Abnormal; Notable for the following:       Result Value   Potassium 3.3 (*)    Glucose, Bld 105 (*)    All other components within normal limits  CBC - Abnormal; Notable for the following:    MCH 25.9 (*)    All other components within normal limits  ETHANOL  RAPID URINE DRUG SCREEN, HOSP PERFORMED  I-STAT TROPOININ, ED    EKG  EKG Interpretation  Date/Time:  Thursday November 04 2016 00:29:56 EST Ventricular Rate:  47 PR Interval:    QRS Duration: 91 QT Interval:  464 QTC Calculation: 411 R Axis:   33 Text Interpretation:  Sinus bradycardia Otherwise within normal limits When compared with ECG of 07/09/2016, No significant change was found Confirmed by [facility]  MD, Velera Lansdale ([id number]) on 11/04/2016 12:34:49 AM       Radiology Dg Chest 2 View  Result Date: 11/03/2016 CLINICAL DATA:  60 year old  female with right-sided chest pain. EXAM: CHEST  2 VIEW COMPARISON:  Chest radiograph dated 07/09/2016 FINDINGS: The lungs are clear. There is no pleural effusion or pneumothorax. The cardiac silhouette is within normal limits. No acute osseous pathology identified. IMPRESSION: No acute cardiopulmonary process. Electronically Signed   By: Elgie Collard M.D.   On: 11/03/2016 22:34    Procedures Procedures (including critical care time)  Medications Ordered in ED Medications  ibuprofen (ADVIL,MOTRIN) tablet 800 mg (800 mg Oral Given 11/04/16 0012)     Initial Impression / Assessment and Plan / ED Course  I have reviewed the triage vital signs and the nursing notes.  Pertinent labs & imaging results that were available during my care of the patient were reviewed by me and considered in my medical decision making (see chart for details).  Clinical Course    Chest pain which appears to be chest wall pain. She  actually has complaints of pain in multiple areas-most of which seem to be arthritic in nature. Old records are reviewed and she does have a prior ED visits for noncardiac chest pain as well as suffers schizophrenia. Schizophrenia does not appear to be an active problem currently. She is given a dose of ibuprofen and sent home with prescription for naproxen. Follow-up with PCP in one week.  Final Clinical Impressions(s) / ED Diagnoses   Final diagnoses:  Chest wall pain  Multiple somatic complaints    New Prescriptions New Prescriptions   NAPROXEN (NAPROSYN) 500 MG TABLET    Take 1 tablet (500 mg total) by mouth 2 (two) times daily.   I personally performed the services described in this documentation, which was scribed in my presence. The recorded information has been reviewed and is accurate.       Dione Boozeavid Lillieanna Tuohy, MD 11/04/16 [phone]

## 2016-11-04 DIAGNOSIS — R0789 Other chest pain: Secondary | ICD-10-CM | POA: Diagnosis present

## 2016-11-04 MED ORDER — NAPROXEN 500 MG PO TABS: 500.0000 mg | ORAL_TABLET | Freq: Two times a day (BID) | ORAL | 0 refills | Status: DC

## 2016-11-04 NOTE — Discharge Instructions (Signed)
Take acetaminophen as needed for additional pain relief. 

## 2016-11-22 ENCOUNTER — Encounter: Payer: Self-pay | Admitting: Family Medicine

## 2016-11-22 ENCOUNTER — Ambulatory Visit (INDEPENDENT_AMBULATORY_CARE_PROVIDER_SITE_OTHER): Payer: Medicaid Other | Admitting: Family Medicine

## 2016-11-22 VITALS — BP 133/73 | HR 70 | Temp 98.0°F | Resp 18 | Ht 62.0 in | Wt 165.0 lb

## 2016-11-22 DIAGNOSIS — G629 Polyneuropathy, unspecified: Secondary | ICD-10-CM | POA: Insufficient documentation

## 2016-11-22 DIAGNOSIS — K219 Gastro-esophageal reflux disease without esophagitis: Secondary | ICD-10-CM | POA: Diagnosis not present

## 2016-11-22 DIAGNOSIS — Z1211 Encounter for screening for malignant neoplasm of colon: Secondary | ICD-10-CM

## 2016-11-22 DIAGNOSIS — Z1231 Encounter for screening mammogram for malignant neoplasm of breast: Secondary | ICD-10-CM

## 2016-11-22 DIAGNOSIS — F32A Depression, unspecified: Secondary | ICD-10-CM

## 2016-11-22 DIAGNOSIS — F329 Major depressive disorder, single episode, unspecified: Secondary | ICD-10-CM

## 2016-11-22 DIAGNOSIS — Z1239 Encounter for other screening for malignant neoplasm of breast: Secondary | ICD-10-CM

## 2016-11-22 DIAGNOSIS — F209 Schizophrenia, unspecified: Secondary | ICD-10-CM

## 2016-11-22 DIAGNOSIS — R635 Abnormal weight gain: Secondary | ICD-10-CM | POA: Diagnosis not present

## 2016-11-22 LAB — TSH: TSH: 0.99 m[IU]/L

## 2016-11-22 LAB — POCT GLYCOSYLATED HEMOGLOBIN (HGB A1C): Hemoglobin A1C: 5.5

## 2016-11-22 MED ORDER — GABAPENTIN 100 MG PO CAPS: 100.0000 mg | ORAL_CAPSULE | Freq: Three times a day (TID) | ORAL | 0 refills | Status: DC

## 2016-11-22 MED ORDER — OMEPRAZOLE 20 MG PO CPDR: 20.0000 mg | DELAYED_RELEASE_CAPSULE | Freq: Every day | ORAL | 0 refills | Status: DC

## 2016-11-22 NOTE — Progress Notes (Signed)
Subjective:    Patient ID: Felicia Collier, female    DOB: December 15, 1955, 61 y.o.   MRN: [id number]  Gastroesophageal Reflux  She reports no abdominal pain, no belching, no chest pain, no choking, no coughing, no dysphagia, no globus sensation, no heartburn, no hoarse voice, no nausea, no sore throat, no tooth decay, no water brash or no wheezing. She has tried an antacid for the symptoms. The treatment provided no relief. Past procedures do not include an abdominal ultrasound, an EGD, esophageal manometry, esophageal pH monitoring, H. pylori antibody titer or a UGI.  Depression       The patient presents with depression.  This is a recurrent (Felicia Collier has a history of paranoid schizophrenia. She is a poor historian.  ) problem.  The current episode started more than 1 year ago.   The onset quality is gradual.   The problem occurs constantly.  The problem has been rapidly worsening since onset.  Associated symptoms include decreased concentration, hopelessness, insomnia, restlessness, appetite change and sad.  Associated symptoms include no suicidal ideas.( hallucinations)  Past treatments include nothing.  Compliance with treatment is poor.  Previous treatment provided no relief relief.  Risk factors include prior psychiatric admission.   Past medical history includes depression and schizophrenia.     Pertinent negatives include no suicide attempts.  Past Medical History:  Diagnosis Date  . Chronic knee pain   . Schizophrenia [facility](HCC)    Social History   Social History Narrative  . No narrative on file   Immunization History  Administered Date(s) Administered  . Tdap 05/24/2016   No Known Allergies Review of Systems  Constitutional: Positive for appetite change.  HENT: Negative.  Negative for hoarse voice and sore throat.   Eyes: Negative.   Respiratory: Negative.  Negative for cough, choking and wheezing.   Cardiovascular: Negative for chest pain.  Gastrointestinal: Negative for abdominal  pain, dysphagia, heartburn and nausea.       Heartburn  Endocrine: Negative.   Genitourinary: Negative.   Musculoskeletal: Negative.   Skin: Negative.   Neurological: Positive for numbness.  Psychiatric/Behavioral: Positive for decreased concentration and depression. Negative for suicidal ideas. The patient has insomnia.        Hallucinations        Objective:   Physical Exam  Constitutional: She is oriented to person, place, and time. She appears well-developed and well-nourished.  HENT:  Head: Normocephalic and atraumatic.  Right Ear: External ear normal.  Left Ear: External ear normal.  Nose: Nose normal.  Mouth/Throat: Oropharynx is clear and moist.  Eyes: Conjunctivae and EOM are normal. Pupils are equal, round, and reactive to light.  Neck: Normal range of motion. Neck supple.  Cardiovascular: Normal rate, regular rhythm, normal heart sounds and intact distal pulses.   Pulmonary/Chest: Effort normal and breath sounds normal.  Abdominal: Soft. Bowel sounds are normal.  Musculoskeletal: Normal range of motion.  Neurological: She is alert and oriented to person, place, and time. She has normal reflexes.  Skin: Skin is warm and dry.  Psychiatric: Her affect is labile. Her speech is not rapid and/or pressured. She is agitated and actively hallucinating. Thought content is paranoid and delusional. Cognition and memory are impaired. She expresses inappropriate judgment. She expresses no homicidal and no suicidal ideation. She expresses no suicidal plans and no homicidal plans. She is inattentive.      BP 133/73 (BP Location: Left Arm, Patient Position: Sitting, Cuff Size: Large)   Pulse 70   Temp  98 F (36.7 C) (Oral)   Resp 18   Ht 5\' 2"  (1.575 m)   Wt 165 lb (74.8 kg)   SpO2 96%   BMI 30.18 kg/m  Assessment & Plan:  1. Gastroesophageal reflux disease without esophagitis The patient is asked to make an attempt to improve diet and exercise patterns to aid in medical  management of this problem. - omeprazole (PRILOSEC) 20 MG capsule; Take 1 capsule (20 mg total) by mouth daily.  Dispense: 42 capsule; Refill: 0  2. Neuropathy (HCC) - gabapentin (NEURONTIN) 100 MG capsule; Take 1 capsule (100 mg total) by mouth 3 (three) times daily.  Dispense: 90 capsule; Refill: 0  3. Depression, unspecified depression type Notified [facility], left message. Patient is a poor historian. Unable to recall recent depression medications. I also called her son back to discuss medication regimen. He is not familiar with current medication regimen.   4. Schizophrenia, unspecified type (HCC) Follow up with [facility] as previously scheduled.   5. Weight gain - TSH - HgB A1c  6. Breast cancer screening Patient does not have a mammogram on file. Notified [facility] to schedule a screening mammogram.  - MM Digital Screening; Future  7. Colon cancer screening Patient has never had a colonoscopy, will send a referral to GI - Ambulatory referral to Gastroenterology   Preventative measures:  Patient will need a pap smear, will schedule at follow up appointment.    RTC: 6 months for pap smear Massie Maroon, FNP

## 2016-11-22 NOTE — Patient Instructions (Addendum)
[facility] [address]  Will start a trial of omeprazole 20 mg daily Will continue Gapapentin 100 mg three times daily as needed

## 2016-11-23 ENCOUNTER — Encounter (HOSPITAL_COMMUNITY): Payer: Self-pay | Admitting: Emergency Medicine

## 2016-11-23 ENCOUNTER — Emergency Department (HOSPITAL_COMMUNITY)
Admission: EM | Admit: 2016-11-23 | Discharge: 2016-11-23 | Disposition: A | Discharge status: Home / Self Care | Payer: Medicaid Other | Attending: Emergency Medicine | Admitting: Emergency Medicine

## 2016-11-23 DIAGNOSIS — N3000 Acute cystitis without hematuria: Secondary | ICD-10-CM | POA: Diagnosis not present

## 2016-11-23 DIAGNOSIS — R1013 Epigastric pain: Secondary | ICD-10-CM | POA: Insufficient documentation

## 2016-11-23 DIAGNOSIS — R109 Unspecified abdominal pain: Secondary | ICD-10-CM | POA: Diagnosis present

## 2016-11-23 LAB — CBC
HCT: 37.2 % (ref 36.0–46.0)
HEMOGLOBIN: 11.9 g/dL — AB (ref 12.0–15.0)
MCH: 26 pg (ref 26.0–34.0)
MCHC: 32 g/dL (ref 30.0–36.0)
MCV: 81.4 fL (ref 78.0–100.0)
PLATELETS: 299 10*3/uL (ref 150–400)
RBC: 4.57 MIL/uL (ref 3.87–5.11)
RDW: 14 % (ref 11.5–15.5)
WBC: 10 10*3/uL (ref 4.0–10.5)

## 2016-11-23 LAB — COMPREHENSIVE METABOLIC PANEL
ALK PHOS: 82 U/L (ref 38–126)
ALT: 15 U/L (ref 14–54)
ANION GAP: 6 (ref 5–15)
AST: 19 U/L (ref 15–41)
Albumin: 3.9 g/dL (ref 3.5–5.0)
BILIRUBIN TOTAL: 0.6 mg/dL (ref 0.3–1.2)
BUN: 14 mg/dL (ref 6–20)
CALCIUM: 9.3 mg/dL (ref 8.9–10.3)
CO2: 29 mmol/L (ref 22–32)
Chloride: 105 mmol/L (ref 101–111)
Creatinine, Ser: 0.76 mg/dL (ref 0.44–1.00)
GFR calc non Af Amer: >60 mL/min (ref >60)
Glucose, Bld: 93 mg/dL (ref 65–99)
Potassium: 3.1 mmol/L — ABNORMAL LOW (ref 3.5–5.1)
Sodium: 140 mmol/L (ref 135–145)
TOTAL PROTEIN: 7.4 g/dL (ref 6.5–8.1)

## 2016-11-23 LAB — URINALYSIS, ROUTINE W REFLEX MICROSCOPIC
Bilirubin Urine: NEGATIVE
GLUCOSE, UA: NEGATIVE mg/dL
HGB URINE DIPSTICK: NEGATIVE
Ketones, ur: NEGATIVE mg/dL
NITRITE: NEGATIVE
PH: 6 (ref 5.0–8.0)
PROTEIN: NEGATIVE mg/dL
Specific Gravity, Urine: 1.015 (ref 1.005–1.030)

## 2016-11-23 LAB — LIPASE, BLOOD: Lipase: 39 U/L (ref 11–51)

## 2016-11-23 MED ORDER — PANTOPRAZOLE SODIUM 40 MG PO TBEC
40.0000 mg | DELAYED_RELEASE_TABLET | Freq: Once | ORAL | Status: AC
  Administered 2016-11-23: 40 mg via ORAL
  Filled 2016-11-23: qty 1

## 2016-11-23 MED ORDER — OMEPRAZOLE 40 MG PO CPDR: 40.0000 mg | DELAYED_RELEASE_CAPSULE | Freq: Every day | ORAL | 0 refills | Status: DC

## 2016-11-23 MED ORDER — GI COCKTAIL ~~LOC~~
30.0000 mL | Freq: Once | ORAL | Status: AC
  Administered 2016-11-23: 30 mL via ORAL
  Filled 2016-11-23: qty 30

## 2016-11-23 MED ORDER — CEPHALEXIN 500 MG PO CAPS: 500.0000 mg | ORAL_CAPSULE | Freq: Three times a day (TID) | ORAL | 0 refills | Status: DC

## 2016-11-23 NOTE — ED Triage Notes (Addendum)
Per EMS pt presents for UTI symptoms of dysuria for the last 2 days. Pt has prior hx of schizophrenia. Pt reports feeling like her stomach is burning and symptoms started after going to the dr today.

## 2016-11-23 NOTE — ED Provider Notes (Signed)
[facility]-EMERGENCY DEPT Provider Note   CSN: [id number] Arrival date & time: 11/23/16  0030  By signing my name below, I, Nelwyn SalisburyJoshua Fowler, attest that this documentation has been prepared under the direction and in the presence of Shon Batonourtney F Horton, MD . Electronically Signed: Nelwyn SalisburyJoshua Fowler, Scribe. 11/23/2016. 3:04 AM.  History   Chief Complaint Chief Complaint  Patient presents with  . Abdominal Pain   The history is provided by the patient. No language interpreter was used.    HPI Comments:  Felicia Collier is a 61 y.o. female who presents to the Emergency Department complaining of sudden-onset, constant, worsening abdominal pain. The pt states this has been going on for a while, but has worsened in the past two days. She describes her symptoms as a 10/10 burning pain that radiates into her chest.  Pt reports associated sore throat.  Pain is somewhat worse with eating. Denies shortness of breath or chest pain. Reports dysuria, and urinary incontinence.  Pt is a poor historian.    Past Medical History:  Diagnosis Date  . Chronic knee pain   . Schizophrenia [facility](HCC)     Patient Active Problem List   Diagnosis Date Noted  . Neuropathy (HCC) 11/22/2016  . Gastroesophageal reflux disease without esophagitis 11/22/2016  . Weight gain 11/22/2016  . Atypical chest pain 11/04/2016    Past Surgical History:  Procedure Laterality Date  . CHOLECYSTECTOMY      OB History    No data available       Home Medications    Prior to Admission medications   Medication Sig Start Date End Date Taking? Authorizing Provider  acetaminophen (TYLENOL) 500 MG tablet Take 1,000 mg by mouth every 6 (six) hours as needed for moderate pain.   Yes Historical Provider, MD  gabapentin (NEURONTIN) 100 MG capsule Take 1 capsule (100 mg total) by mouth 3 (three) times daily. 11/22/16  Yes Massie MaroonLachina M Hollis, FNP  omeprazole (PRILOSEC) 20 MG capsule Take 1 capsule (20 mg total) by mouth daily. 11/22/16  Yes Massie MaroonLachina M  Hollis, FNP  cephALEXin (KEFLEX) 500 MG capsule Take 1 capsule (500 mg total) by mouth 3 (three) times daily. 11/23/16   Shon Batonourtney F Horton, MD  naproxen (NAPROSYN) 500 MG tablet Take 1 tablet (500 mg total) by mouth 2 (two) times daily. Patient not taking: Reported on 11/22/2016 11/04/16   Dione Boozeavid Glick, MD  omeprazole (PRILOSEC) 40 MG capsule Take 1 capsule (40 mg total) by mouth daily. 11/23/16   Shon Batonourtney F Horton, MD    Family History History reviewed. No pertinent family history.  Social History Social History  Substance Use Topics  . Smoking status: Never Smoker  . Smokeless tobacco: Never Used  . Alcohol use No     Allergies   Patient has no known allergies.   Review of Systems Review of Systems  Constitutional: Negative for fever.  HENT: Positive for sore throat.   Respiratory: Negative for shortness of breath.   Cardiovascular: Negative for chest pain.  Gastrointestinal: Positive for abdominal pain. Negative for nausea and vomiting.  Genitourinary: Positive for dysuria.       Positive for Urinary Incontinence  All other systems reviewed and are negative.    Physical Exam Updated Vital Signs BP 135/81 (BP Location: Left Arm)   Pulse 61   Temp 97.9 F (36.6 C) (Oral)   Resp 19   Ht 5\' 4"  (1.626 m)   Wt 165 lb (74.8 kg)   SpO2 97%   BMI 28.32  kg/m   Physical Exam  Constitutional: She is oriented to person, place, and time. No distress.  HENT:  Head: Normocephalic and atraumatic.  Cardiovascular: Normal rate, regular rhythm and normal heart sounds.   No murmur heard. Pulmonary/Chest: Effort normal and breath sounds normal. No respiratory distress. She has no wheezes.  Abdominal: Soft. Bowel sounds are normal. There is no tenderness. There is no guarding.  Neurological: She is alert and oriented to person, place, and time.  Skin: Skin is warm and dry.  Psychiatric: She has a normal mood and affect.  Strange affect  Nursing note and vitals reviewed.    ED  Treatments / Results  DIAGNOSTIC STUDIES:  Oxygen Saturation is 96% on RA, normal by my interpretation.    COORDINATION OF CARE:  3:21 AM Discussed treatment plan with pt at bedside which includes blood work and pt agreed to plan.  Labs (all labs ordered are listed, but only abnormal results are displayed) Labs Reviewed  COMPREHENSIVE METABOLIC PANEL - Abnormal; Notable for the following:       Result Value   Potassium 3.1 (*)    All other components within normal limits  CBC - Abnormal; Notable for the following:    Hemoglobin 11.9 (*)    All other components within normal limits  URINALYSIS, ROUTINE W REFLEX MICROSCOPIC - Abnormal; Notable for the following:    Leukocytes, UA LARGE (*)    Bacteria, UA RARE (*)    Squamous Epithelial / LPF 0-5 (*)    All other components within normal limits  URINE CULTURE  LIPASE, BLOOD    EKG  EKG Interpretation  Date/Time:  Tuesday November 23 2016 03:55:50 EST Ventricular Rate:  43 PR Interval:    QRS Duration: 94 QT Interval:  484 QTC Calculation: 410 R Axis:   7 Text Interpretation:  Sinus bradycardia Left ventricular hypertrophy Borderline T abnormalities, inferior leads No significant change since last tracing Confirmed by HORTON  MD, Toni Amend ([id number]) on 11/23/2016 4:34:53 AM       Radiology No results found.  Procedures Procedures (including critical care time)  Medications Ordered in ED Medications  gi cocktail (Maalox,Lidocaine,Donnatal) (30 mLs Oral Given 11/23/16 0347)  pantoprazole (PROTONIX) EC tablet 40 mg (40 mg Oral Given 11/23/16 0347)     Initial Impression / Assessment and Plan / ED Course  I have reviewed the triage vital signs and the nursing notes.  Pertinent labs & imaging results that were available during my care of the patient were reviewed by me and considered in my medical decision making (see chart for details).  Clinical Course     Patient presents with abdominal pain she describes as burning  and radiating into her chest. Also reports dysuria. She is nontoxic. Basic labwork obtained and largely reassuring. She does have evidence of likely urinary tract infection given her symptoms. Urine culture was sent. Will discharge on Keflex. Patient improved with Protonix and GI cocktail. Suspect gastritis versus gastric ulcer. She states that she does not take any acid reducers at home. Will start on omeprazole. Follow-up with primary physician.  After history, exam, and medical workup I feel the patient has been appropriately medically screened and is safe for discharge home. Pertinent diagnoses were discussed with the patient. Patient was given return precautions.   Final Clinical Impressions(s) / ED Diagnoses   Final diagnoses:  Epigastric pain  Acute cystitis without hematuria    New Prescriptions New Prescriptions   CEPHALEXIN (KEFLEX) 500 MG CAPSULE  Take 1 capsule (500 mg total) by mouth 3 (three) times daily.   OMEPRAZOLE (PRILOSEC) 40 MG CAPSULE    Take 1 capsule (40 mg total) by mouth daily.   I personally performed the services described in this documentation, which was scribed in my presence. The recorded information has been reviewed and is accurate.     Shon Baton, MD 11/23/16 [phone]

## 2016-11-24 LAB — URINE CULTURE

## 2016-11-27 ENCOUNTER — Emergency Department (HOSPITAL_COMMUNITY)
Admission: EM | Admit: 2016-11-27 | Discharge: 2016-11-27 | Disposition: A | Discharge status: Home / Self Care | Payer: Medicaid Other | Attending: Dermatology | Admitting: Dermatology

## 2016-11-27 ENCOUNTER — Encounter (HOSPITAL_COMMUNITY): Payer: Self-pay | Admitting: Nurse Practitioner

## 2016-11-27 DIAGNOSIS — R109 Unspecified abdominal pain: Secondary | ICD-10-CM | POA: Diagnosis present

## 2016-11-27 DIAGNOSIS — Z5321 Procedure and treatment not carried out due to patient leaving prior to being seen by health care provider: Secondary | ICD-10-CM | POA: Diagnosis not present

## 2016-11-27 NOTE — ED Notes (Signed)
Pt continues to hit call bell and stand in doorway.

## 2016-11-27 NOTE — ED Notes (Signed)
Pt decided to leave without being seen by a provider 

## 2016-11-27 NOTE — ED Notes (Signed)
Pt states that she is going to leave if she has to wait any longer. This RN explained to pt that an EDP will sign up and see her as soon as they can. Pt expresses no other needs at this time. Pt decided not to leave yet.

## 2016-11-27 NOTE — ED Notes (Signed)
Patient states she did f/u with PCP since her last visit and was told to continue taking the Prilosec as prescribed. However, she states her stomach burning and discomfort has not improved at all and that medication is not working. "I can not keep going on like this it is so uncomfortable and I am miserable."

## 2016-11-27 NOTE — ED Notes (Signed)
Pt refused to sign out.

## 2016-11-27 NOTE — ED Triage Notes (Signed)
Pt states she would like to be re-evaluated for her abdominal pain that she was evaluated for 4 days ago adding that the 10/10 burning sensation in her stomach has not improved.

## 2016-11-28 ENCOUNTER — Emergency Department (HOSPITAL_COMMUNITY)
Admission: EM | Admit: 2016-11-28 | Discharge: 2016-11-28 | Disposition: A | Discharge status: Home / Self Care | Payer: Medicaid Other | Attending: Emergency Medicine | Admitting: Emergency Medicine

## 2016-11-28 ENCOUNTER — Encounter (HOSPITAL_COMMUNITY): Payer: Self-pay | Admitting: *Deleted

## 2016-11-28 DIAGNOSIS — N3000 Acute cystitis without hematuria: Secondary | ICD-10-CM | POA: Diagnosis not present

## 2016-11-28 DIAGNOSIS — R1084 Generalized abdominal pain: Secondary | ICD-10-CM | POA: Diagnosis present

## 2016-11-28 LAB — CBC WITH DIFFERENTIAL/PLATELET
Basophils Absolute: 0 10*3/uL (ref 0.0–0.1)
Basophils Relative: 0 %
Eosinophils Absolute: 0.1 10*3/uL (ref 0.0–0.7)
Eosinophils Relative: 1 %
HCT: 40.2 % (ref 36.0–46.0)
HEMOGLOBIN: 12.8 g/dL (ref 12.0–15.0)
LYMPHS ABS: 2.1 10*3/uL (ref 0.7–4.0)
LYMPHS PCT: 25 %
MCH: 26.2 pg (ref 26.0–34.0)
MCHC: 31.8 g/dL (ref 30.0–36.0)
MCV: 82.4 fL (ref 78.0–100.0)
Monocytes Absolute: 0.6 10*3/uL (ref 0.1–1.0)
Monocytes Relative: 7 %
NEUTROS ABS: 5.5 10*3/uL (ref 1.7–7.7)
NEUTROS PCT: 67 %
Platelets: 314 10*3/uL (ref 150–400)
RBC: 4.88 MIL/uL (ref 3.87–5.11)
RDW: 13.7 % (ref 11.5–15.5)
WBC: 8.3 10*3/uL (ref 4.0–10.5)

## 2016-11-28 LAB — COMPREHENSIVE METABOLIC PANEL
ALT: 13 U/L — ABNORMAL LOW (ref 14–54)
ANION GAP: 9 (ref 5–15)
AST: 21 U/L (ref 15–41)
Albumin: 3.6 g/dL (ref 3.5–5.0)
Alkaline Phosphatase: 89 U/L (ref 38–126)
BUN: 12 mg/dL (ref 6–20)
CHLORIDE: 104 mmol/L (ref 101–111)
CO2: 25 mmol/L (ref 22–32)
Calcium: 9.2 mg/dL (ref 8.9–10.3)
Creatinine, Ser: 0.79 mg/dL (ref 0.44–1.00)
Glucose, Bld: 78 mg/dL (ref 65–99)
POTASSIUM: 3.5 mmol/L (ref 3.5–5.1)
Sodium: 138 mmol/L (ref 135–145)
Total Bilirubin: 0.7 mg/dL (ref 0.3–1.2)
Total Protein: 7.4 g/dL (ref 6.5–8.1)

## 2016-11-28 LAB — URINALYSIS, ROUTINE W REFLEX MICROSCOPIC
BACTERIA UA: NONE SEEN
BILIRUBIN URINE: NEGATIVE
Glucose, UA: NEGATIVE mg/dL
HGB URINE DIPSTICK: NEGATIVE
KETONES UR: NEGATIVE mg/dL
NITRITE: NEGATIVE
PROTEIN: NEGATIVE mg/dL
Specific Gravity, Urine: 1.004 — ABNORMAL LOW (ref 1.005–1.030)
pH: 7 (ref 5.0–8.0)

## 2016-11-28 LAB — LIPASE, BLOOD: LIPASE: 40 U/L (ref 11–51)

## 2016-11-28 MED ORDER — NITROFURANTOIN MONOHYD MACRO 100 MG PO CAPS
100.0000 mg | ORAL_CAPSULE | Freq: Once | ORAL | Status: AC
  Administered 2016-11-28: 100 mg via ORAL
  Filled 2016-11-28: qty 1

## 2016-11-28 MED ORDER — NITROFURANTOIN MONOHYD MACRO 100 MG PO CAPS: 100.0000 mg | ORAL_CAPSULE | Freq: Two times a day (BID) | ORAL | 0 refills | Status: DC

## 2016-11-28 MED ORDER — GI COCKTAIL ~~LOC~~
30.0000 mL | Freq: Once | ORAL | Status: AC
  Administered 2016-11-28: 30 mL via ORAL
  Filled 2016-11-28: qty 30

## 2016-11-28 NOTE — ED Triage Notes (Signed)
Pt reports generalized abd pain since 1/1. Denies n/v/d or urinary symptoms. Reports left hand and knee pain. Ambulatory at triage, no distress noted.

## 2016-11-28 NOTE — ED Provider Notes (Signed)
[facility]-EMERGENCY DEPT Provider Note   CSN: [id number] Arrival date & time: 11/28/16  [id number]  History   Chief Complaint Chief Complaint  Patient presents with  . Abdominal Pain    HPI Felicia LatheSheila Simerson is a 61 y.o. female.  HPI  Patient has PMH of chronic knee pain and schizophrenia. She reports having abdominal burning and concern that medications she was prescribed were the wrong one from the previous visit. THe pain is diffuse and mild. No N/V/D, fevers, back pain. She has had some dysuria. Says that this has been going on for the past past few months, is having some burning.   Past Medical History:  Diagnosis Date  . Chronic knee pain   . Schizophrenia [facility](HCC)     Patient Active Problem List   Diagnosis Date Noted  . Neuropathy (HCC) 11/22/2016  . Gastroesophageal reflux disease without esophagitis 11/22/2016  . Weight gain 11/22/2016  . Atypical chest pain 11/04/2016    Past Surgical History:  Procedure Laterality Date  . CHOLECYSTECTOMY      OB History    No data available       Home Medications    Prior to Admission medications   Medication Sig Start Date End Date Taking? Authorizing Provider  acetaminophen (TYLENOL) 500 MG tablet Take 1,000 mg by mouth every 6 (six) hours as needed for moderate pain.   Yes Historical Provider, MD  cephALEXin (KEFLEX) 500 MG capsule Take 1 capsule (500 mg total) by mouth 3 (three) times daily. Patient not taking: Reported on 11/28/2016 11/23/16   Shon Batonourtney F Horton, MD  gabapentin (NEURONTIN) 100 MG capsule Take 1 capsule (100 mg total) by mouth 3 (three) times daily. Patient not taking: Reported on 11/28/2016 11/22/16   Massie MaroonLachina M Hollis, FNP  naproxen (NAPROSYN) 500 MG tablet Take 1 tablet (500 mg total) by mouth 2 (two) times daily. Patient not taking: Reported on 11/27/2016 11/04/16   Dione Boozeavid Glick, MD  nitrofurantoin, macrocrystal-monohydrate, (MACROBID) 100 MG capsule Take 1 capsule (100 mg total) by mouth 2 (two) times daily. 11/28/16    Jarad Barth Neva SeatGreene, PA-C  omeprazole (PRILOSEC) 20 MG capsule Take 1 capsule (20 mg total) by mouth daily. Patient not taking: Reported on 11/28/2016 11/22/16   Massie MaroonLachina M Hollis, FNP  omeprazole (PRILOSEC) 40 MG capsule Take 1 capsule (40 mg total) by mouth daily. Patient not taking: Reported on 11/28/2016 11/23/16   Shon Batonourtney F Horton, MD    Family History History reviewed. No pertinent family history.  Social History Social History  Substance Use Topics  . Smoking status: Never Smoker  . Smokeless tobacco: Never Used  . Alcohol use No     Allergies   Patient has no known allergies.   Review of Systems Review of Systems  Review of Systems All other systems negative except as documented in the HPI. All pertinent positives and negatives as reviewed in the HPI.  Physical Exam Updated Vital Signs BP 151/79 (BP Location: Right Arm)   Pulse (!) 54   Temp 99 F (37.2 C) (Oral)   Resp 16   SpO2 100%   Physical Exam  Constitutional: She appears well-developed and well-nourished.  HENT:  Head: Normocephalic and atraumatic.  Eyes: Conjunctivae are normal. Pupils are equal, round, and reactive to light.  Neck: Trachea normal, normal range of motion and full passive range of motion without pain. Neck supple.  Cardiovascular: Normal rate, regular rhythm and normal pulses.   Pulmonary/Chest: Effort normal and breath sounds normal. Chest wall is not dull  to percussion. She exhibits no tenderness, no crepitus, no edema, no deformity and no retraction.  Abdominal: Soft. Normal appearance and bowel sounds are normal. She exhibits no distension and no ascites. There is tenderness in the suprapubic area. There is no rigidity, no rebound, no guarding, no CVA tenderness and negative Murphy's sign.  Musculoskeletal: Normal range of motion.  Neurological: She is alert. She has normal strength.  Skin: Skin is warm, dry and intact.  Psychiatric: She has a normal mood and affect. Her speech is normal and  behavior is normal. Judgment and thought content normal. Cognition and memory are normal.    ED Treatments / Results  Labs (all labs ordered are listed, but only abnormal results are displayed) Labs Reviewed  URINALYSIS, ROUTINE W REFLEX MICROSCOPIC - Abnormal; Notable for the following:       Result Value   Color, Urine STRAW (*)    Specific Gravity, Urine 1.004 (*)    Leukocytes, UA LARGE (*)    Squamous Epithelial / LPF 0-5 (*)    Non Squamous Epithelial 0-5 (*)    All other components within normal limits  COMPREHENSIVE METABOLIC PANEL - Abnormal; Notable for the following:    ALT 13 (*)    All other components within normal limits  URINE CULTURE  LIPASE, BLOOD  CBC WITH DIFFERENTIAL/PLATELET    EKG  EKG Interpretation None       Radiology No results found.  Procedures Procedures (including critical care time)  Medications Ordered in ED Medications  nitrofurantoin (macrocrystal-monohydrate) (MACROBID) capsule 100 mg (not administered)  gi cocktail (Maalox,Lidocaine,Donnatal) (30 mLs Oral Given 11/28/16 1319)    Initial Impression / Assessment and Plan / ED Course  I have reviewed the triage vital signs and the nursing notes.  Pertinent labs & imaging results that were available during my care of the patient were reviewed by me and considered in my medical decision making (see chart for details).  Clinical Course     Lab-work unremarkable, + UTI. Sent out urine culture. Started on Macrobid as she recently was on Keflex earlier in the month. Pt seen by Dr. Jeraldine Loots as well.  Medications  nitrofurantoin (macrocrystal-monohydrate) (MACROBID) capsule 100 mg (not administered)  gi cocktail (Maalox,Lidocaine,Donnatal) (30 mLs Oral Given 11/28/16 1319)    I discussed results, diagnoses and plan with Felicia Collier. They voice there understanding and questions were answered. We discussed follow-up recommendations and return precautions.   Final Clinical  Impressions(s) / ED Diagnoses   Final diagnoses:  Acute cystitis without hematuria    New Prescriptions New Prescriptions   NITROFURANTOIN, MACROCRYSTAL-MONOHYDRATE, (MACROBID) 100 MG CAPSULE    Take 1 capsule (100 mg total) by mouth 2 (two) times daily.     Marlon Pel, PA-C 11/28/16 1438    Gerhard Munch, MD 11/28/16 [phone]

## 2016-11-28 NOTE — ED Notes (Signed)
Pt discharged, no distress. Ambulatory to lobby, voiced understanding of d/c instructions.

## 2016-11-29 LAB — URINE CULTURE: Culture: <10000 — AB

## 2016-12-03 ENCOUNTER — Emergency Department (HOSPITAL_COMMUNITY)
Admission: EM | Admit: 2016-12-03 | Discharge: 2016-12-03 | Disposition: A | Discharge status: Home / Self Care | Payer: Medicaid Other | Attending: Emergency Medicine | Admitting: Emergency Medicine

## 2016-12-03 ENCOUNTER — Encounter (HOSPITAL_COMMUNITY): Payer: Self-pay | Admitting: *Deleted

## 2016-12-03 DIAGNOSIS — Z5321 Procedure and treatment not carried out due to patient leaving prior to being seen by health care provider: Secondary | ICD-10-CM | POA: Insufficient documentation

## 2016-12-03 DIAGNOSIS — R109 Unspecified abdominal pain: Secondary | ICD-10-CM | POA: Insufficient documentation

## 2016-12-03 LAB — COMPREHENSIVE METABOLIC PANEL
ALK PHOS: 93 U/L (ref 38–126)
ALT: 15 U/L (ref 14–54)
ANION GAP: 5 (ref 5–15)
AST: 19 U/L (ref 15–41)
Albumin: 4 g/dL (ref 3.5–5.0)
BILIRUBIN TOTAL: 0.8 mg/dL (ref 0.3–1.2)
BUN: 8 mg/dL (ref 6–20)
CALCIUM: 9.2 mg/dL (ref 8.9–10.3)
CO2: 30 mmol/L (ref 22–32)
Chloride: 103 mmol/L (ref 101–111)
Creatinine, Ser: 0.77 mg/dL (ref 0.44–1.00)
GFR calc non Af Amer: >60 mL/min (ref >60)
Glucose, Bld: 96 mg/dL (ref 65–99)
Potassium: 3.1 mmol/L — ABNORMAL LOW (ref 3.5–5.1)
Sodium: 138 mmol/L (ref 135–145)
TOTAL PROTEIN: 7.5 g/dL (ref 6.5–8.1)

## 2016-12-03 LAB — CBC
HCT: 38.8 % (ref 36.0–46.0)
HEMOGLOBIN: 12.3 g/dL (ref 12.0–15.0)
MCH: 25.9 pg — ABNORMAL LOW (ref 26.0–34.0)
MCHC: 31.7 g/dL (ref 30.0–36.0)
MCV: 81.9 fL (ref 78.0–100.0)
Platelets: 273 10*3/uL (ref 150–400)
RBC: 4.74 MIL/uL (ref 3.87–5.11)
RDW: 14 % (ref 11.5–15.5)
WBC: 8.1 10*3/uL (ref 4.0–10.5)

## 2016-12-03 LAB — LIPASE, BLOOD: Lipase: 31 U/L (ref 11–51)

## 2016-12-03 NOTE — ED Triage Notes (Signed)
Pt states she is having nausea and abd pain, left writ and left knee pain, no injury, pt come a few days ago LWBS for same. Tol liquids and food well.

## 2016-12-03 NOTE — ED Notes (Signed)
Pt called twice to go back to a room with no answer.  

## 2016-12-03 NOTE — ED Triage Notes (Signed)
Pt left prior to being seen ?

## 2016-12-06 ENCOUNTER — Ambulatory Visit: Payer: Medicaid Other

## 2016-12-09 ENCOUNTER — Emergency Department (HOSPITAL_COMMUNITY)
Admission: EM | Admit: 2016-12-09 | Discharge: 2016-12-09 | Disposition: A | Discharge status: Home / Self Care | Payer: Medicaid Other | Attending: Emergency Medicine | Admitting: Emergency Medicine

## 2016-12-09 DIAGNOSIS — M79642 Pain in left hand: Secondary | ICD-10-CM | POA: Diagnosis not present

## 2016-12-09 MED ORDER — IBUPROFEN 400 MG PO TABS
400.0000 mg | ORAL_TABLET | Freq: Once | ORAL | Status: AC
  Administered 2016-12-09: 400 mg via ORAL
  Filled 2016-12-09: qty 1

## 2016-12-09 NOTE — ED Triage Notes (Signed)
Patient comes in with c/o left hand cramping and aching starting 2 days ago. She is a poor historian. Patient states she woke up and her hand cramped up. Patient states her hand feels feverish to her and that something is just wrong with it. Patient is able to move extremity. Good cap refill. +radial pulse. No injury noted.

## 2016-12-09 NOTE — ED Provider Notes (Signed)
[facility] DEPT Provider Note   CSN: [id number] Arrival date & time: 12/09/16  [id number]     History   Chief Complaint Chief Complaint  Patient presents with  . Hand Pain    HPI  Blood pressure 137/69, temperature 98.1 F (36.7 C), temperature source Oral, resp. rate 16, height 5\' 4"  (1.626 m), weight 76.2 kg, SpO2 97 %.  Felicia Collier is a 61 y.o. female with past medical history significant for schizophrenia and chronic knee pain complaining of a left-sided hand pain onset 1 year ago. She states that she feels that it's swollen and sometimes there is a fever in the hand. She states that she feels like there is a hole in it that was there the other day but this resolved. She realizes there is not a whole the hand right now. There was no trauma to the hand, she is retired. She is left-hand dominant. She denies suicidal ideation, homicidal ideation, auditory hallucinations, drug or alcohol use. She states she's compliant with her schizophrenia medication. There was no trauma.   Past Medical History:  Diagnosis Date  . Chronic knee pain   . Schizophrenia [facility](HCC)     Patient Active Problem List   Diagnosis Date Noted  . Neuropathy (HCC) 11/22/2016  . Gastroesophageal reflux disease without esophagitis 11/22/2016  . Weight gain 11/22/2016  . Atypical chest pain 11/04/2016    Past Surgical History:  Procedure Laterality Date  . CHOLECYSTECTOMY      OB History    No data available       Home Medications    Prior to Admission medications   Medication Sig Start Date End Date Taking? Authorizing Provider  acetaminophen (TYLENOL) 500 MG tablet Take 1,000 mg by mouth every 6 (six) hours as needed for moderate pain.    Historical Provider, MD  cephALEXin (KEFLEX) 500 MG capsule Take 1 capsule (500 mg total) by mouth 3 (three) times daily. Patient not taking: Reported on 11/28/2016 11/23/16   Shon Batonourtney F Horton, MD  gabapentin (NEURONTIN) 100 MG capsule Take 1 capsule (100 mg total)  by mouth 3 (three) times daily. Patient not taking: Reported on 11/28/2016 11/22/16   Massie MaroonLachina M Hollis, FNP  naproxen (NAPROSYN) 500 MG tablet Take 1 tablet (500 mg total) by mouth 2 (two) times daily. Patient not taking: Reported on 11/27/2016 11/04/16   Dione Boozeavid Glick, MD  nitrofurantoin, macrocrystal-monohydrate, (MACROBID) 100 MG capsule Take 1 capsule (100 mg total) by mouth 2 (two) times daily. 11/28/16   Tiffany Neva SeatGreene, PA-C  omeprazole (PRILOSEC) 20 MG capsule Take 1 capsule (20 mg total) by mouth daily. Patient not taking: Reported on 11/28/2016 11/22/16   Massie MaroonLachina M Hollis, FNP  omeprazole (PRILOSEC) 40 MG capsule Take 1 capsule (40 mg total) by mouth daily. Patient not taking: Reported on 11/28/2016 11/23/16   Shon Batonourtney F Horton, MD    Family History No family history on file.  Social History Social History  Substance Use Topics  . Smoking status: Never Smoker  . Smokeless tobacco: Never Used  . Alcohol use No     Allergies   Patient has no known allergies.   Review of Systems Review of Systems  10 systems reviewed and found to be negative, except as noted in the HPI.  Physical Exam Updated Vital Signs BP 137/69   Temp 98.1 F (36.7 C) (Oral)   Resp 16   Ht 5\' 4"  (1.626 m)   Wt 76.2 kg   SpO2 97%   BMI 28.84 kg/m  Physical Exam  Constitutional: She is oriented to person, place, and time. She appears well-developed and well-nourished. No distress.  HENT:  Head: Normocephalic and atraumatic.  Mouth/Throat: Oropharynx is clear and moist.  Eyes: Conjunctivae and EOM are normal. Pupils are equal, round, and reactive to light.  Neck: Normal range of motion.  Cardiovascular: Normal rate, regular rhythm and intact distal pulses.   Pulmonary/Chest: Effort normal and breath sounds normal.  Abdominal: Soft. There is no tenderness.  Musculoskeletal: Normal range of motion. She exhibits no edema, tenderness or deformity.  Left hand with no deformity, full active range of motion,  no swelling or edema or warmth. Radial pulses 2+.  Neurological: She is alert and oriented to person, place, and time.  Skin: She is not diaphoretic.  Psychiatric:  Poor historian, oriented 3. No suicidal ideation, homicidal ideation, auditory or visual hallucinations. Does not appear to be responding to internal stimuli.  Nursing note and vitals reviewed.    ED Treatments / Results  Labs (all labs ordered are listed, but only abnormal results are displayed) Labs Reviewed - No data to display  EKG  EKG Interpretation None       Radiology No results found.  Procedures Procedures (including critical care time)  Medications Ordered in ED Medications  ibuprofen (ADVIL,MOTRIN) tablet 400 mg (not administered)     Initial Impression / Assessment and Plan / ED Course  I have reviewed the triage vital signs and the nursing notes.  Pertinent labs & imaging results that were available during my care of the patient were reviewed by me and considered in my medical decision making (see chart for details).    Vitals:   12/09/16 0742 12/09/16 0746  BP: 137/69   Resp:  16  Temp: 98.1 F (36.7 C)   TempSrc: Oral   SpO2:  97%  Weight: 76.2 kg   Height: 5\' 4"  (1.626 m)     Medications  ibuprofen (ADVIL,MOTRIN) tablet 400 mg (not administered)    [name] is 61 y.o. female presenting with Left hand pain which she's had for one year. Physical exam with no abnormality, full active range of motion. Of note, this patient states that she feels like there was a hole in her hand the other day which has resolved. She does have a history of schizophrenia, she does not appear to be grossly disorganized and realizes that there is not a hole in the hand right now. No suicidal ideation, homicidal ideation. She does not appear to be responding to internal stimuli. I don't think she needs emergent psychiatric intervention at this time. Patient given ibuprofen and advised to follow closely  with primary care.  Evaluation does not show pathology that would require ongoing emergent intervention or inpatient treatment. Pt is hemodynamically stable and mentating appropriately. Discussed findings and plan with patient/guardian, who agrees with care plan. All questions answered. Return precautions discussed and outpatient follow up given.   Final Clinical Impressions(s) / ED Diagnoses   Final diagnoses:  Left hand pain    New Prescriptions New Prescriptions   No medications on file     Wynetta Emery, PA-C 12/09/16 [id number]    Geoffery Lyons, MD 12/09/16 1447

## 2016-12-09 NOTE — Discharge Instructions (Signed)
For pain control please take Ibuprofen (also known as Motrin or Advil) 400mg (this is normally 2 over the counter pills) every 6 hours. Take with food to minimize stomach irritation. ° °Please follow with your primary care doctor in the next 2 days for a check-up. They must obtain records for further management.  ° °Do not hesitate to return to the Emergency Department for any new, worsening or concerning symptoms.  ° °

## 2016-12-13 ENCOUNTER — Ambulatory Visit (INDEPENDENT_AMBULATORY_CARE_PROVIDER_SITE_OTHER): Payer: Medicaid Other | Admitting: Family Medicine

## 2016-12-13 ENCOUNTER — Encounter: Payer: Self-pay | Admitting: Family Medicine

## 2016-12-13 ENCOUNTER — Other Ambulatory Visit: Payer: Self-pay | Admitting: Family Medicine

## 2016-12-13 VITALS — BP 118/54 | HR 65 | Temp 98.1°F | Resp 12 | Ht 61.0 in | Wt 164.0 lb

## 2016-12-13 DIAGNOSIS — R300 Dysuria: Secondary | ICD-10-CM

## 2016-12-13 DIAGNOSIS — Z1159 Encounter for screening for other viral diseases: Secondary | ICD-10-CM

## 2016-12-13 DIAGNOSIS — Z0000 Encounter for general adult medical examination without abnormal findings: Secondary | ICD-10-CM

## 2016-12-13 DIAGNOSIS — Z23 Encounter for immunization: Secondary | ICD-10-CM

## 2016-12-13 DIAGNOSIS — Z114 Encounter for screening for human immunodeficiency virus [HIV]: Secondary | ICD-10-CM

## 2016-12-13 DIAGNOSIS — Z1211 Encounter for screening for malignant neoplasm of colon: Secondary | ICD-10-CM

## 2016-12-13 MED ORDER — PNEUMOCOCCAL 13-VAL CONJ VACC IM SUSP
0.5000 mL | INTRAMUSCULAR | Status: AC | PRN
  Administered 2016-12-13: 0.5 mL via INTRAMUSCULAR

## 2016-12-13 NOTE — Patient Instructions (Signed)
Call [phone] to schedule a mammogram and PAP smear. Someone will call about  A colonoscopy.  I would soak hand in warm water several times a day. Would also get a small soft ball and exercise hand by squeezing ball.

## 2016-12-13 NOTE — Progress Notes (Signed)
Felicia Collier, is a 61 y.o. female  ZOX:[id number]  WJX:[id number]  DOB - Feb 07, 1956  CC:  Chief Complaint  Patient presents with  . left hand pain    had left hand surgery about 1 year ago with Dr Rayburn Ma and has had difficulty with it again recently went to ER and they told her to use Advil and see PCP, Advil does not help much and the pain is interferring with her sleep, hand feels like it is"drawing" up        HPI: Felicia Collier is a 61 y.o. female here with left hand pain. She was seen recently in ED and advised to take OTC pain medication. I have explained they is nothing definitive that I can do. We have discussed good sleeping position, as it seems to bother her sleep.  While she is here today we will address health maintenance issues.  Health maintenance:  Needs screening for HIV, Hep C. Colon cancer, cervical cancer and breast cancer. Needs 1st pneumonia    No Known Allergies Past Medical History:  Diagnosis Date  . Chronic knee pain   . Schizophrenia [facility])    Current Outpatient Prescriptions on File Prior to Visit  Medication Sig Dispense Refill  . acetaminophen (TYLENOL) 500 MG tablet Take 1,000 mg by mouth every 6 (six) hours as needed for moderate pain.    . cephALEXin (KEFLEX) 500 MG capsule Take 1 capsule (500 mg total) by mouth 3 (three) times daily. (Patient not taking: Reported on 11/28/2016) 21 capsule 0  . gabapentin (NEURONTIN) 100 MG capsule Take 1 capsule (100 mg total) by mouth 3 (three) times daily. (Patient not taking: Reported on 11/28/2016) 90 capsule 0  . naproxen (NAPROSYN) 500 MG tablet Take 1 tablet (500 mg total) by mouth 2 (two) times daily. (Patient not taking: Reported on 11/27/2016) 30 tablet 0  . nitrofurantoin, macrocrystal-monohydrate, (MACROBID) 100 MG capsule Take 1 capsule (100 mg total) by mouth 2 (two) times daily. (Patient not taking: Reported on 12/13/2016) 10 capsule 0  . omeprazole (PRILOSEC) 40 MG capsule Take 1 capsule (40 mg total) by  mouth daily. (Patient not taking: Reported on 11/28/2016) 30 capsule 0   No current facility-administered medications on file prior to visit.    History reviewed. No pertinent family history. Social History   Social History  . Marital status: Single    Spouse name: N/A  . Number of children: N/A  . Years of education: N/A   Occupational History  . Not on file.   Social History Main Topics  . Smoking status: Never Smoker  . Smokeless tobacco: Never Used  . Alcohol use No  . Drug use: No  . Sexual activity: Not on file   Other Topics Concern  . Not on file   Social History Narrative  . No narrative on file    Review of Systems: Constitutional: Negative Skin: Negative HENT: Negative  Eyes: Negative  Neck: Negative Respiratory: Negative Cardiovascular: Negative Gastrointestinal: Negative Genitourinary: Negative  Musculoskeletal: pain in left hand and fingers, some low back pain.  Neurological: Negative for Hematological: Negative  Psychiatric/Behavioral: Hx mental health issues.   Objective:   Vitals:   12/13/16 0812  BP: (!) 118/54  Pulse: 65  Resp: 12  Temp: 98.1 F (36.7 C)    Physical Exam: Constitutional: Patient appears well-developed and well-nourished. No distress. HENT: Normocephalic, atraumatic, External right and left ear normal. Oropharynx is clear and moist.  Eyes: Conjunctivae and EOM are normal. PERRLA, no scleral  icterus. Neck: Normal ROM. Neck supple. No lymphadenopathy, No thyromegaly. CVS: RRR, S1/S2 +, no murmurs, no gallops, no rubs Pulmonary: Effort and breath sounds normal, no stridor, rhonchi, wheezes, rales.  Abdominal: Soft. Normoactive BS,, no distension, tenderness, rebound or guarding.  Musculoskeletal: Normal range of motion. No edema and no tenderness.  Neuro: Alert.Normal muscle tone coordination. Non-focal Skin: Skin is warm and dry. No rash noted. Not diaphoretic. No erythema. No pallor. Psychiatric: Normal mood and  affect. Behavior, judgment, thought content normal.  Lab Results  Component Value Date   WBC 8.1 12/03/2016   HGB 12.3 12/03/2016   HCT 38.8 12/03/2016   MCV 81.9 12/03/2016   PLT 273 12/03/2016   Lab Results  Component Value Date   CREATININE 0.77 12/03/2016   BUN 8 12/03/2016   NA 138 12/03/2016   K 3.1 (L) 12/03/2016   CL 103 12/03/2016   CO2 30 12/03/2016    Lab Results  Component Value Date   HGBA1C 5.5 11/22/2016   Lipid Panel     Component Value Date/Time   CHOL 200 05/24/2016 1403   TRIG 109 05/24/2016 1403   HDL 56 05/24/2016 1403   CHOLHDL 3.6 05/24/2016 1403   VLDL 22 05/24/2016 1403   LDLCALC 122 05/24/2016 1403        Assessment and plan:   1. Need for prophylactic vaccination against Streptococcus pneumoniae (pneumococcus)  - pneumococcal 13-valent conjugate vaccine (PREVNAR 13) injection 0.5 mL; Inject 0.5 mLs into the muscle Prior to discharge for immunization.  2. Screening for HIV (human immunodeficiency virus)  - HIV antibody (with reflex)  3. Encounter for hepatitis C screening test for low risk patient  - Hepatitis C antibody, reflex  4. Special screening for malignant neoplasms, colon  - Ambulatory referral to Gastroenterology  5. Dysuria   6. Healthcare maintenance  - COMPLETE METABOLIC PANEL WITH GFR - CBC with Differential - Lipid panel   Return in about 6 months (around 06/12/2017), or if symptoms worsen or fail to improve.  The patient was given clear instructions to go to ER or return to medical center if symptoms don't improve, worsen or new problems develop. The patient verbalized understanding.    Henrietta HooverLinda C Romen Yutzy FNP  12/13/2016, 9:11 AM

## 2016-12-14 LAB — HIV ANTIBODY (ROUTINE TESTING W REFLEX): HIV 1&2 Ab, 4th Generation: NONREACTIVE

## 2016-12-14 LAB — HEPATITIS C ANTIBODY: HCV AB: NEGATIVE

## 2016-12-20 ENCOUNTER — Ambulatory Visit: Payer: Medicaid Other | Admitting: Family Medicine

## 2016-12-29 ENCOUNTER — Emergency Department (HOSPITAL_COMMUNITY)
Admission: EM | Admit: 2016-12-29 | Discharge: 2016-12-29 | Disposition: A | Discharge status: Home / Self Care | Payer: Medicaid Other | Attending: Emergency Medicine | Admitting: Emergency Medicine

## 2016-12-29 ENCOUNTER — Encounter (HOSPITAL_COMMUNITY): Payer: Self-pay | Admitting: Emergency Medicine

## 2016-12-29 DIAGNOSIS — M546 Pain in thoracic spine: Secondary | ICD-10-CM | POA: Diagnosis present

## 2016-12-29 DIAGNOSIS — M549 Dorsalgia, unspecified: Secondary | ICD-10-CM

## 2016-12-29 DIAGNOSIS — Z79899 Other long term (current) drug therapy: Secondary | ICD-10-CM | POA: Diagnosis not present

## 2016-12-29 DIAGNOSIS — M62830 Muscle spasm of back: Secondary | ICD-10-CM | POA: Insufficient documentation

## 2016-12-29 MED ORDER — METHOCARBAMOL 500 MG PO TABS: 500.0000 mg | ORAL_TABLET | Freq: Two times a day (BID) | ORAL | 0 refills | Status: DC

## 2016-12-29 MED ORDER — IBUPROFEN 800 MG PO TABS: 800.0000 mg | ORAL_TABLET | Freq: Three times a day (TID) | ORAL | 0 refills | Status: DC

## 2016-12-29 NOTE — Discharge Instructions (Signed)
1. Medications: robaxin, ibuprofen, usual home medications 2. Treatment: rest, drink plenty of fluids, gentle stretching as discussed, alternate ice and heat 3. Follow Up: Please followup with your primary doctor in 3 days for discussion of your diagnoses and further evaluation after today's visit; if you do not have a primary care doctor use the resource guide provided to find one;  Return to the ER for worsening back pain, difficulty walking, loss of bowel or bladder control or other concerning symptoms    

## 2016-12-29 NOTE — ED Notes (Signed)
Pt states she has upper back/neck pain that feels like "something crawling and stinging" in her back. Denies injury/trauma.

## 2016-12-29 NOTE — ED Triage Notes (Signed)
Pt to ED with c/o upper back pain x's 1 month

## 2016-12-29 NOTE — ED Provider Notes (Signed)
[facility] Provider Note   CSN: [id number] Arrival date & time: 12/29/16  0236     History   Chief Complaint Chief Complaint  Patient presents with  . Back Pain    HPI Felicia Collier is a 61 y.o. female with a hx of Schizophrenia and chronic knee pain presents to the Emergency Department complaining of gradual, persistent, upper back pain onset greater than one month ago. Patient reports the pain feels like something is crawling and stinging her back. Symptoms are worse with lying flat. She reports she saw the emergency department for this in the past and was given an unknown medication which does not work. No other treatments attempted. Patient denies numbness, tingling, weakness, loss of bowel or bladder control, gait disturbance, fevers, rash, falls or other trauma. She denies IV drug use, history of cancer, anticoagulation. Patient denies suicidal or homicidal ideation.  The history is provided by the patient and medical records. No language interpreter was used.    Past Medical History:  Diagnosis Date  . Chronic knee pain   . Schizophrenia [facility])     Patient Active Problem List   Diagnosis Date Noted  . Neuropathy (HCC) 11/22/2016  . Gastroesophageal reflux disease without esophagitis 11/22/2016  . Weight gain 11/22/2016  . Atypical chest pain 11/04/2016    Past Surgical History:  Procedure Laterality Date  . CHOLECYSTECTOMY      OB History    No data available       Home Medications    Prior to Admission medications   Medication Sig Start Date End Date Taking? Authorizing Provider  acetaminophen (TYLENOL) 500 MG tablet Take 1,000 mg by mouth every 6 (six) hours as needed for moderate pain.    Historical Provider, MD  cephALEXin (KEFLEX) 500 MG capsule Take 1 capsule (500 mg total) by mouth 3 (three) times daily. Patient not taking: Reported on 11/28/2016 11/23/16   Shon Baton, MD  gabapentin (NEURONTIN) 100 MG capsule Take 1 capsule (100 mg total)  by mouth 3 (three) times daily. Patient not taking: Reported on 11/28/2016 11/22/16   Massie Maroon, FNP  ibuprofen (ADVIL,MOTRIN) 800 MG tablet Take 1 tablet (800 mg total) by mouth 3 (three) times daily. 12/29/16   Yanel Dombrosky, PA-C  methocarbamol (ROBAXIN) 500 MG tablet Take 1 tablet (500 mg total) by mouth 2 (two) times daily. 12/29/16   Jordy Hewins, PA-C  naproxen (NAPROSYN) 500 MG tablet Take 1 tablet (500 mg total) by mouth 2 (two) times daily. Patient not taking: Reported on 11/27/2016 11/04/16   Dione Booze, MD  nitrofurantoin, macrocrystal-monohydrate, (MACROBID) 100 MG capsule Take 1 capsule (100 mg total) by mouth 2 (two) times daily. Patient not taking: Reported on 12/13/2016 11/28/16   Marlon Pel, PA-C  omeprazole (PRILOSEC) 40 MG capsule Take 1 capsule (40 mg total) by mouth daily. Patient not taking: Reported on 11/28/2016 11/23/16   Shon Baton, MD    Family History No family history on file.  Social History Social History  Substance Use Topics  . Smoking status: Never Smoker  . Smokeless tobacco: Never Used  . Alcohol use No     Allergies   Patient has no known allergies.   Review of Systems Review of Systems  Musculoskeletal: Positive for back pain.  All other systems reviewed and are negative.    Physical Exam Updated Vital Signs BP 133/79 (BP Location: Left Arm)   Pulse 60   Temp 97.9 F (36.6 C) (Oral)   Resp  14   Ht 5\' 4"  (1.626 m)   Wt 73.7 kg   SpO2 98%   BMI 27.89 kg/m   Physical Exam  Constitutional: She appears well-developed and well-nourished. No distress.  HENT:  Head: Normocephalic and atraumatic.  Mouth/Throat: Oropharynx is clear and moist. No oropharyngeal exudate.  Eyes: Conjunctivae are normal.  Neck: Normal range of motion. Neck supple.  Full ROM without pain  Cardiovascular: Normal rate, regular rhythm and intact distal pulses.   Pulmonary/Chest: Effort normal and breath sounds normal. No respiratory  distress. She has no wheezes.  Abdominal: Soft. She exhibits no distension. There is no tenderness.  Musculoskeletal:  Full range of motion of the T-spine and L-spine No midline tenderness to the  T-spine or L-spine Tenderness to palpation of the paraspinous muscles of the upper T-spine and a bilateral trapezius. No tenderness to palpation to the lower T or L-spine.  Lymphadenopathy:    She has no cervical adenopathy.  Neurological: She is alert. She has normal reflexes.  Reflex Scores:      Bicep reflexes are 2+ on the right side and 2+ on the left side.      Brachioradialis reflexes are 2+ on the right side and 2+ on the left side.      Patellar reflexes are 2+ on the right side and 2+ on the left side.      Achilles reflexes are 2+ on the right side and 2+ on the left side. Speech is clear and goal oriented, follows commands Normal 5/5 strength in upper and lower extremities bilaterally including dorsiflexion and plantar flexion, strong and equal grip strength Sensation normal to light and sharp touch Moves extremities without ataxia, coordination intact Normal gait Normal balance No Clonus  Skin: Skin is warm and dry. No rash noted. She is not diaphoretic. No erythema.  Psychiatric: She has a normal mood and affect. Her behavior is normal.  Nursing note and vitals reviewed.    ED Treatments / Results   Procedures Procedures (including critical care time)  Medications Ordered in ED Medications - No data to display   Initial Impression / Assessment and Plan / ED Course  I have reviewed the triage vital signs and the nursing notes.  Pertinent labs & imaging results that were available during my care of the patient were reviewed by me and considered in my medical decision making (see chart for details).     He presents with greater than one month of upper back pain. On exam there is no rash to suggest shingles. No open wounds. She is full range of motion of her back, arms  and legs. Normal gait. Vital signs are stable. She is without fever. No history of IV drug use. Highly doubt epidural abscess. No other red flags. Will give ibuprofen and Robaxin for relief. Patient reports she has an appointment with her doctor next week. I recommended that she discuss this issue with her physician at this visit. She is in no distress.  Patient is calm and cooperative. She is alert and oriented. She is not suicidal or homicidal. She is out of danger to herself or others. Discussed reasons to return to emergency department including fever, chills, nausea, vomiting, worsening pain. Patient states understanding and is in agreement with the plan.  Final Clinical Impressions(s) / ED Diagnoses   Final diagnoses:  Upper back pain  Muscle spasm of back    New Prescriptions New Prescriptions   IBUPROFEN (ADVIL,MOTRIN) 800 MG TABLET    Take  1 tablet (800 mg total) by mouth 3 (three) times daily.   METHOCARBAMOL (ROBAXIN) 500 MG TABLET    Take 1 tablet (500 mg total) by mouth 2 (two) times daily.     Dahlia ClientHannah Kyler Germer, PA-C 12/29/16 [id number]    Gilda Creasehristopher J Pollina, MD 12/29/16 [phone]

## 2017-01-04 ENCOUNTER — Ambulatory Visit: Payer: Medicaid Other | Admitting: Family Medicine

## 2017-01-10 ENCOUNTER — Ambulatory Visit (INDEPENDENT_AMBULATORY_CARE_PROVIDER_SITE_OTHER): Payer: Medicaid Other | Admitting: Orthopaedic Surgery

## 2017-01-12 ENCOUNTER — Encounter: Payer: Self-pay | Admitting: Family Medicine

## 2017-01-14 ENCOUNTER — Emergency Department (HOSPITAL_COMMUNITY)
Admission: EM | Admit: 2017-01-14 | Discharge: 2017-01-14 | Disposition: A | Discharge status: Home / Self Care | Payer: Medicaid Other | Attending: Emergency Medicine | Admitting: Emergency Medicine

## 2017-01-14 ENCOUNTER — Encounter (HOSPITAL_COMMUNITY): Payer: Self-pay | Admitting: Emergency Medicine

## 2017-01-14 ENCOUNTER — Emergency Department (HOSPITAL_COMMUNITY): Payer: Medicaid Other

## 2017-01-14 DIAGNOSIS — M79605 Pain in left leg: Secondary | ICD-10-CM | POA: Insufficient documentation

## 2017-01-14 DIAGNOSIS — M25562 Pain in left knee: Secondary | ICD-10-CM

## 2017-01-14 DIAGNOSIS — G8929 Other chronic pain: Secondary | ICD-10-CM | POA: Insufficient documentation

## 2017-01-14 MED ORDER — IBUPROFEN 600 MG PO TABS: 600.0000 mg | ORAL_TABLET | Freq: Four times a day (QID) | ORAL | 0 refills | Status: DC | PRN

## 2017-01-14 NOTE — ED Notes (Signed)
Pt going to xray  

## 2017-01-14 NOTE — ED Provider Notes (Signed)
[facility]-EMERGENCY DEPT Provider Note    By signing my name below, I, Earmon Phoenix, attest that this documentation has been prepared under the direction and in the presence of [facility], [place]. Electronically Signed: Earmon Phoenix, ED Scribe. 01/14/17. 8:44 PM.   History   Chief Complaint Chief Complaint  Patient presents with  . Leg Pain    The history is provided by the patient and medical records. No language interpreter was used.    Felicia Collier is a 61 y.o. female with multiple previous pain complaints with PMHx of chronic pain of the LUE and LLE, schizophrenia and GERD brought in by EMS, who presents to the Emergency Department complaining of left knee soreness that began over one month ago. She reports associated swelling, warmth of the knee and pain that radiates up and down the leg. She has not taken anything for pain relief. Flexion and extension of the knee increases the pain. She denies alleviating factors. She denies fever, chills, nausea, vomiting, numbness, tingling or weakness of the lower extremities. She reports having surgery on the left knee last year but according to medical records she had trigger point injections four months ago with no record of surgery at this facility. She denies smoking. She denies h/o PE/DVT. She states she walks with a crutch. She states her orthopedist is Dr. Rayburn Ma and missed her last appointment last week.   Past Medical History:  Diagnosis Date  . Chronic knee pain   . Schizophrenia [facility])     Patient Active Problem List   Diagnosis Date Noted  . Neuropathy (HCC) 11/22/2016  . Gastroesophageal reflux disease without esophagitis 11/22/2016  . Weight gain 11/22/2016  . Atypical chest pain 11/04/2016    Past Surgical History:  Procedure Laterality Date  . CHOLECYSTECTOMY      OB History    No data available       Home Medications    Prior to Admission medications   Medication Sig Start Date End Date Taking? Authorizing  Provider  acetaminophen (TYLENOL) 500 MG tablet Take 1,000 mg by mouth every 6 (six) hours as needed for moderate pain.    Historical Provider, MD  gabapentin (NEURONTIN) 100 MG capsule Take 1 capsule (100 mg total) by mouth 3 (three) times daily. Patient not taking: Reported on 11/28/2016 11/22/16   Massie Maroon, FNP  ibuprofen (ADVIL,MOTRIN) 600 MG tablet Take 1 tablet (600 mg total) by mouth every 6 (six) hours as needed. 01/14/17   Hope Orlene Och, NP  methocarbamol (ROBAXIN) 500 MG tablet Take 1 tablet (500 mg total) by mouth 2 (two) times daily. 12/29/16   Hannah Muthersbaugh, PA-C  omeprazole (PRILOSEC) 40 MG capsule Take 1 capsule (40 mg total) by mouth daily. Patient not taking: Reported on 11/28/2016 11/23/16   Shon Baton, MD    Family History History reviewed. No pertinent family history.  Social History Social History  Substance Use Topics  . Smoking status: Never Smoker  . Smokeless tobacco: Never Used  . Alcohol use No     Allergies   Patient has no known allergies.   Review of Systems Review of Systems  Constitutional: Negative for chills and fever.  Respiratory: Negative for shortness of breath.   Gastrointestinal: Negative for nausea and vomiting.  Musculoskeletal: Positive for arthralgias and joint swelling. Negative for back pain.  Skin: Negative for color change.     Physical Exam Updated Vital Signs BP 150/94 (BP Location: Left Arm)   Pulse 63   Temp  98.4 F (36.9 C) (Oral)   Resp 18   SpO2 97%   Physical Exam  Constitutional: She is oriented to person, place, and time. She appears well-developed and well-nourished. No distress.  HENT:  Head: Normocephalic.  Eyes: EOM are normal.  Neck: Neck supple.  Cardiovascular: Normal rate.   Dorsalis Pedis pulses 2+ bilaterally.  Pulmonary/Chest: Effort normal.  Abdominal: Soft. There is no tenderness.  Musculoskeletal: Normal range of motion.       Left ankle: She exhibits normal range of motion and  no swelling. Achilles tendon normal.  Full ROM of left ankle and left hip without pain. No calf tenderness. Tenderness to palpation to anterior aspect of left knee with flexion. Swelling noted to left knee.  Neurological: She is alert and oriented to person, place, and time. No cranial nerve deficit.  Sensations normal pedal pulses 2+, adequate circulation.  Skin: Skin is warm and dry.  Psychiatric: She has a normal mood and affect.  Nursing note and vitals reviewed.    ED Treatments / Results  DIAGNOSTIC STUDIES: Oxygen Saturation is 97% on RA, normal by my interpretation.   COORDINATION OF CARE: 7:30 PM- Will X-Ray left knee. Pt verbalizes understanding and agrees to plan.  Medications - No data to display   Labs (all labs ordered are listed, but only abnormal results are displayed) Labs Reviewed - No data to display Radiology Dg Knee Complete 4 Views Left  Result Date: 01/14/2017 CLINICAL DATA:  Left knee pain EXAM: LEFT KNEE - COMPLETE 4+ VIEW COMPARISON:  09/26/2016 FINDINGS: Osteoarthritis with marginal spurring greatest at the medial compartment where there is also joint narrowing and sclerosis. No fracture, malalignment, or suspected joint effusion. Small sclerotic structure along the femoral diaphysis most consistent with bone island. IMPRESSION: 1. No acute finding. 2. 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 Electronically Signed   By: Marnee SpringJonathon  Watts M.D.   On: 01/14/2017 20:18    Procedures Procedures (including critical care time)  Medications Ordered in ED Medications - No data to display   Initial Impression / Assessment and Plan / ED Course  I have reviewed the triage vital signs and the nursing notes.  Pertinent  imaging results that were available during my care of the patient were reviewed by me and considered in my medical decision making (see chart for details).   Patient here with chronic left knee pain. X-Ray negative for obvious  fracture or dislocation. Pt advised to follow up with orthopedics. Patient given knee sleeve while in ED, conservative therapy recommended and discussed. Patient will be discharged home & is agreeable with above plan. Returns precautions discussed. Pt appears safe for discharge without focal neuro deficits.  I personally performed the services described in this documentation, which was scribed in my presence. The recorded information has been reviewed and is accurate.   Final Clinical Impressions(s) / ED Diagnoses   Final diagnoses:  Chronic pain of left knee    New Prescriptions Discharge Medication List as of 01/14/2017  8:44 PM       [facility] Orlene OchM Neese, NP 01/15/17 0315    Mancel BaleElliott Wentz, MD 01/16/17 [id number]

## 2017-01-14 NOTE — Progress Notes (Signed)
Orthopedic Tech Progress Note Patient Details:  Rowland LatheSheila Pen 12-08-1955 [id number]  Ortho Devices Type of Ortho Device: Knee Sleeve Ortho Device/Splint Location: lle Ortho Device/Splint Interventions: Ordered, Application   Trinna PostMartinez, Chaniah Cisse J 01/14/2017, 8:59 PM

## 2017-01-14 NOTE — ED Notes (Signed)
Ortho paged. 

## 2017-01-14 NOTE — ED Notes (Signed)
ED Provider at bedside. 

## 2017-01-14 NOTE — ED Notes (Signed)
Pt c/o pain in her legs for awhile  Walking around in the room without her crutches she has

## 2017-01-14 NOTE — ED Triage Notes (Signed)
Pt c/o left sided arm and leg pain, with a hx of surgeries and chronic pain to the same.  Pt states her left leg ifs "drawing up" and she is having difficulty ambulating.  Pt using a crutch to ambulate at this time.  Pt denies any new symptoms.

## 2017-01-15 ENCOUNTER — Other Ambulatory Visit: Payer: Self-pay | Admitting: Family Medicine

## 2017-01-15 DIAGNOSIS — G629 Polyneuropathy, unspecified: Secondary | ICD-10-CM

## 2017-01-18 ENCOUNTER — Encounter (HOSPITAL_COMMUNITY): Payer: Self-pay

## 2017-01-18 ENCOUNTER — Emergency Department (HOSPITAL_COMMUNITY)
Admission: EM | Admit: 2017-01-18 | Discharge: 2017-01-18 | Disposition: A | Discharge status: Home / Self Care | Payer: Medicaid Other | Attending: Emergency Medicine | Admitting: Emergency Medicine

## 2017-01-18 DIAGNOSIS — M542 Cervicalgia: Secondary | ICD-10-CM | POA: Diagnosis not present

## 2017-01-18 MED ORDER — ACETAMINOPHEN 325 MG PO TABS
650.0000 mg | ORAL_TABLET | Freq: Once | ORAL | Status: AC
  Administered 2017-01-18: 650 mg via ORAL
  Filled 2017-01-18: qty 2

## 2017-01-18 MED ORDER — NAPROXEN 250 MG PO TABS: 250.0000 mg | ORAL_TABLET | Freq: Two times a day (BID) | ORAL | 0 refills | Status: DC

## 2017-01-18 NOTE — ED Provider Notes (Signed)
[facility]-EMERGENCY DEPT Provider Note   CSN: [id number] Arrival date & time: 01/18/17  2100     History   Chief Complaint Chief Complaint  Patient presents with  . Back Pain    HPI Felicia Collier is a 61 y.o. female.  Felicia Collier is a 61 y.o. Female with a history of schizophrenia and chronic knee pain who presents to the emergency department complaining of right-sided neck pain for about a week and a half. Patient reports right-sided neck pain that is worse with movement of her head for about a week and a half. She denies any known injury. She is not fallen or injured her head or neck. She has taken nothing for treatment of her symptoms. She reports having some bilateral low back pain that is ongoing and not worsened or changed. She tells me she is really here for her neck pain. Patient denies fevers, numbness, tingling, weakness, loss of bowel or bladder control, head injury, arm pain, headaches, or rashes.   The history is provided by the patient and medical records. No language interpreter was used.  Back Pain   Pertinent negatives include no chest pain, no fever, no numbness, no headaches, no dysuria and no weakness.    Past Medical History:  Diagnosis Date  . Chronic knee pain   . Schizophrenia [facility])     Patient Active Problem List   Diagnosis Date Noted  . Neuropathy (HCC) 11/22/2016  . Gastroesophageal reflux disease without esophagitis 11/22/2016  . Weight gain 11/22/2016  . Atypical chest pain 11/04/2016    Past Surgical History:  Procedure Laterality Date  . CHOLECYSTECTOMY      OB History    No data available       Home Medications    Prior to Admission medications   Medication Sig Start Date End Date Taking? Authorizing Provider  acetaminophen (TYLENOL) 500 MG tablet Take 1,000 mg by mouth every 6 (six) hours as needed for moderate pain.    Historical Provider, MD  gabapentin (NEURONTIN) 100 MG capsule TAKE 1 CAPSULE (100 MG TOTAL) BY MOUTH 3 (THREE)  TIMES DAILY. 01/17/17   Massie Maroon, FNP  ibuprofen (ADVIL,MOTRIN) 600 MG tablet Take 1 tablet (600 mg total) by mouth every 6 (six) hours as needed. 01/14/17   Hope Orlene Och, NP  methocarbamol (ROBAXIN) 500 MG tablet Take 1 tablet (500 mg total) by mouth 2 (two) times daily. 12/29/16   Hannah Muthersbaugh, PA-C  naproxen (NAPROSYN) 250 MG tablet Take 1 tablet (250 mg total) by mouth 2 (two) times daily with a meal. 01/18/17   Everlene Farrier, PA-C  omeprazole (PRILOSEC) 40 MG capsule Take 1 capsule (40 mg total) by mouth daily. Patient not taking: Reported on 11/28/2016 11/23/16   Shon Baton, MD    Family History History reviewed. No pertinent family history.  Social History Social History  Substance Use Topics  . Smoking status: Never Smoker  . Smokeless tobacco: Never Used  . Alcohol use No     Allergies   Patient has no known allergies.   Review of Systems Review of Systems  Constitutional: Negative for chills and fever.  Eyes: Negative for visual disturbance.  Respiratory: Negative for shortness of breath.   Cardiovascular: Negative for chest pain.  Genitourinary: Negative for difficulty urinating and dysuria.  Musculoskeletal: Positive for back pain and neck pain. Negative for neck stiffness.  Skin: Negative for rash and wound.  Neurological: Negative for dizziness, weakness, light-headedness, numbness and headaches.  Physical Exam Updated Vital Signs BP 138/75 (BP Location: Left Arm)   Pulse 78   Temp 97.8 F (36.6 C) (Oral)   Resp 18   Ht 5\' 2"  (1.575 m)   Wt 73.5 kg   SpO2 100%   BMI 29.63 kg/m   Physical Exam  Constitutional: She appears well-developed and well-nourished. No distress.  Nontoxic appearing.  HENT:  Head: Normocephalic and atraumatic.  Right Ear: External ear normal.  Left Ear: External ear normal.  Mouth/Throat: Oropharynx is clear and moist.  Eyes: Conjunctivae are normal. Pupils are equal, round, and reactive to light. Right eye  exhibits no discharge. Left eye exhibits no discharge.  Neck: Normal range of motion. Neck supple. No JVD present. No tracheal deviation present.  Tenderness over her right lateral trapezius musculature. No midline neck tenderness. Patient has good range of motion of her neck without meningeal signs. She is able to place her chin to her chest and look up to the ceiling. She is able to rotate her head greater than 45 in each direction.  Cardiovascular: Normal rate, regular rhythm, normal heart sounds and intact distal pulses.   Bilateral radial pulses are intact.  Pulmonary/Chest: Effort normal and breath sounds normal. No stridor. No respiratory distress.  Abdominal: Soft. There is no tenderness.  Musculoskeletal: She exhibits no edema or tenderness.  No midline back tenderness. No back erythema, edema or ecchymosis.  Lymphadenopathy:    She has no cervical adenopathy.  Neurological: She is alert. No sensory deficit. She exhibits normal muscle tone. Coordination normal.  Sensation is intact to her bilateral upper extremities. Good grip strengths bilaterally. Good range of motion of her bilateral shoulders.  Skin: Skin is warm and dry. Capillary refill takes less than 2 seconds. No rash noted. She is not diaphoretic. No erythema. No pallor.  Psychiatric: She has a normal mood and affect. Her behavior is normal.  Nursing note and vitals reviewed.    ED Treatments / Results  Labs (all labs ordered are listed, but only abnormal results are displayed) Labs Reviewed - No data to display  EKG  EKG Interpretation None       Radiology No results found.  Procedures Procedures (including critical care time)  Medications Ordered in ED Medications  acetaminophen (TYLENOL) tablet 650 mg (not administered)     Initial Impression / Assessment and Plan / ED Course  I have reviewed the triage vital signs and the nursing notes.  Pertinent labs & imaging results that were available during  my care of the patient were reviewed by me and considered in my medical decision making (see chart for details).    This is a 61 y.o. Female with a history of schizophrenia and chronic knee pain who presents to the emergency department complaining of right-sided neck pain for about a week and a half. Patient reports right-sided neck pain that is worse with movement of her head for about a week and a half. She denies any known injury. She is not fallen or injured her head or neck. She has taken nothing for treatment of her symptoms. She reports having some bilateral low back pain that is ongoing and not worsened or changed.  On exam the patient is afebrile nontoxic appearing. She has mild tenderness over her right trapezius musculature. No midline neck or back tenderness. She is neurovascularly intact. I see no need for imaging at this time. We'll have her start naproxen and ice to the area to help with her neck pain.  No meningeal signs. No torticollis. I discussed return precautions.  I advised the patient to follow-up with their primary care provider this week. I advised the patient to return to the emergency department with new or worsening symptoms or new concerns. The patient verbalized understanding and agreement with plan.      Final Clinical Impressions(s) / ED Diagnoses   Final diagnoses:  Neck pain on right side    New Prescriptions New Prescriptions   NAPROXEN (NAPROSYN) 250 MG TABLET    Take 1 tablet (250 mg total) by mouth 2 (two) times daily with a meal.     Everlene FarrierWilliam Nhu Glasby, PA-C 01/18/17 2324    Alvira MondayErin Schlossman, MD 01/19/17 1409

## 2017-01-18 NOTE — ED Triage Notes (Signed)
Back pain for months all over back no bowel or bladder problems.  No fever voiced.

## 2017-01-21 ENCOUNTER — Ambulatory Visit (HOSPITAL_COMMUNITY)
Admission: EM | Admit: 2017-01-21 | Discharge: 2017-01-21 | Disposition: A | Discharge status: Home / Self Care | Payer: Medicaid Other | Attending: Emergency Medicine | Admitting: Emergency Medicine

## 2017-01-21 ENCOUNTER — Encounter (HOSPITAL_COMMUNITY): Payer: Self-pay | Admitting: *Deleted

## 2017-01-21 DIAGNOSIS — M25511 Pain in right shoulder: Secondary | ICD-10-CM

## 2017-01-21 DIAGNOSIS — G8929 Other chronic pain: Secondary | ICD-10-CM | POA: Diagnosis not present

## 2017-01-21 MED ORDER — DICLOFENAC SODIUM 75 MG PO TBEC: 75.0000 mg | DELAYED_RELEASE_TABLET | Freq: Two times a day (BID) | ORAL | 0 refills | Status: DC

## 2017-01-21 NOTE — ED Provider Notes (Signed)
CSN: [id number]     Arrival date & time 01/21/17  0957 History   First MD Initiated Contact with Patient 01/21/17 1012     Chief Complaint  Patient presents with  . Shoulder Pain   (Consider location/radiation/quality/duration/timing/severity/associated sxs/prior Treatment) 61 year old female presents to clinic with a chief complaint of shoulder pain, and neck pain. She's had this pain ongoing for approximately 1 month, she's been evaluated by her primary care provider, and twice in the emergency room for her pain. She describes it as a dull ache in her right shoulder moderately relieved with over-the-counter NSAIDs, and she was prescribed a course of naproxen at her last ER visit. She's had no fever, chills, nausea, headaches, no restriction in range of motion, she is a right-handed, and is able to continue with her regular ADLs.   The history is provided by the patient.    Past Medical History:  Diagnosis Date  . Chronic knee pain   . Schizophrenia [facility](HCC)    Past Surgical History:  Procedure Laterality Date  . CHOLECYSTECTOMY     History reviewed. No pertinent family history. Social History  Substance Use Topics  . Smoking status: Never Smoker  . Smokeless tobacco: Never Used  . Alcohol use No   OB History    No data available     Review of Systems  Reason unable to perform ROS: as covered in HPI.  All other systems reviewed and are negative.   Allergies  Patient has no known allergies.  Home Medications   Prior to Admission medications   Medication Sig Start Date End Date Taking? Authorizing Provider  acetaminophen (TYLENOL) 500 MG tablet Take 1,000 mg by mouth every 6 (six) hours as needed for moderate pain.    Historical Provider, MD  diclofenac (VOLTAREN) 75 MG EC tablet Take 1 tablet (75 mg total) by mouth 2 (two) times daily. 01/21/17   Dorena BodoLawrence Amyah Clawson, NP  gabapentin (NEURONTIN) 100 MG capsule TAKE 1 CAPSULE (100 MG TOTAL) BY MOUTH 3 (THREE) TIMES DAILY. 01/17/17    Massie MaroonLachina M Hollis, FNP  ibuprofen (ADVIL,MOTRIN) 600 MG tablet Take 1 tablet (600 mg total) by mouth every 6 (six) hours as needed. 01/14/17   Hope Orlene OchM Neese, NP  methocarbamol (ROBAXIN) 500 MG tablet Take 1 tablet (500 mg total) by mouth 2 (two) times daily. 12/29/16   Hannah Muthersbaugh, PA-C  omeprazole (PRILOSEC) 40 MG capsule Take 1 capsule (40 mg total) by mouth daily. Patient not taking: Reported on 11/28/2016 11/23/16   Shon Batonourtney F Horton, MD   Meds Ordered and Administered this Visit  Medications - No data to display  BP (!) 170/110 (BP Location: Left Arm)   Pulse 78   Temp 98.6 F (37 C) (Oral)   Resp 18   SpO2 100%  No data found.   Physical Exam  Constitutional: She is oriented to person, place, and time. She appears well-developed and well-nourished. No distress.  HENT:  Head: Normocephalic and atraumatic.  Right Ear: External ear normal.  Left Ear: External ear normal.  Eyes: EOM are normal. Pupils are equal, round, and reactive to light.  Neck: Normal range of motion. Neck supple. No JVD present.  Cardiovascular: Normal rate and regular rhythm.   Pulmonary/Chest: Effort normal and breath sounds normal.  Musculoskeletal: Normal range of motion. She exhibits no edema or tenderness.  No restriction of range of motion of the neck, no meningeal signs, no restriction in range of motion of the shoulder with flexion, extension, abduction, abduction,  internal or external rotation.  Lymphadenopathy:    She has no cervical adenopathy.  Neurological: She is alert and oriented to person, place, and time.  Skin: Skin is warm and dry. Capillary refill takes less than 2 seconds. No rash noted. She is not diaphoretic. No erythema.  Psychiatric: She has a normal mood and affect. Her behavior is normal.  Nursing note and vitals reviewed.   Urgent Care Course     Procedures (including critical care time)  Labs Review Labs Reviewed - No data to display  Imaging Review No results  found.    MDM   1. Chronic right shoulder pain    For your pain, I have prescribed a medicine called diclofenac. Take one tablet twice a day as needed for pain. Included a handout for shoulder range of motion exercises, I recommend you read through this and do these exercises. Should your pain persist, follow up with her primary care clinic, or orthopedics. Also, while taking the medicine I have prescribed, do not take any naproxen, ibuprofen, or other otc antiinflammatories, OTC tylenol is fine if you need additional relief, ever 4-6 hours not to exceed 4000 mg a day.      Dorena Bodo, NP 01/21/17 1034

## 2017-01-21 NOTE — Discharge Instructions (Signed)
For your pain, I have prescribed a medicine called diclofenac. Take one tablet twice a day as needed for pain. Included a handout for shoulder range of motion exercises, I recommend you read through this and do these exercises. Should your pain persist, follow up with her primary care clinic, or orthopedics. Also, while taking the medicine I have prescribed, do not take any naproxen, ibuprofen, or other otc antiinflammatories, OTC tylenol is fine if you need additional relief, ever 4-6 hours not to exceed 4000 mg a day.

## 2017-01-21 NOTE — ED Triage Notes (Signed)
Pt reports  Pain  r  shoulder     And  Neck   With  Onset     Of   Symptoms     X   sev  Days   Pain is worse  On  Movement  And  posistion       Pt  Sitting  Upright  On  Examination table  Speaking  In   Complete   sentances

## 2017-01-25 ENCOUNTER — Emergency Department (HOSPITAL_COMMUNITY)
Admission: EM | Admit: 2017-01-25 | Discharge: 2017-01-26 | Disposition: A | Discharge status: Home / Self Care | Payer: Medicaid Other | Attending: Emergency Medicine | Admitting: Emergency Medicine

## 2017-01-25 DIAGNOSIS — G8929 Other chronic pain: Secondary | ICD-10-CM | POA: Insufficient documentation

## 2017-01-25 DIAGNOSIS — M25511 Pain in right shoulder: Secondary | ICD-10-CM | POA: Diagnosis not present

## 2017-01-25 DIAGNOSIS — Z79899 Other long term (current) drug therapy: Secondary | ICD-10-CM | POA: Insufficient documentation

## 2017-01-25 DIAGNOSIS — F2089 Other schizophrenia: Secondary | ICD-10-CM

## 2017-01-25 DIAGNOSIS — M25562 Pain in left knee: Secondary | ICD-10-CM | POA: Diagnosis not present

## 2017-01-25 MED ORDER — ACETAMINOPHEN 325 MG PO TABS
650.0000 mg | ORAL_TABLET | Freq: Once | ORAL | Status: AC
  Administered 2017-01-26: 650 mg via ORAL
  Filled 2017-01-25: qty 2

## 2017-01-25 NOTE — ED Provider Notes (Signed)
[facility]-EMERGENCY DEPT Provider Note   CSN: [id number] Arrival date & time: 01/25/17  2146     History   Chief Complaint Chief Complaint  Patient presents with  . Generalized Pain    HPI Felicia Collier is a 61 y.o. female.  Felicia Collier is a 61 y.o. Female with a history of chronic right shoulder pain, chronic left knee pain and schizophrenia who presents to the emergency department complaining of her ongoing chronic right shoulder and left knee pain. She denies any new injury or trauma to her joints. She denies any recent fall. She does me her pain is improved with the naproxen I prescribed for her the last time I saw her. Contrary to nursing of patient does not have a history of MS and denies history of MS to me. She does have a history of schizophrenia and follows at [facility]. She does me sometimes she thinks a bubble is following her and is contributing to her chronic pain. She denies SI or HI. She denies visual or auditory hallucinations currently. She reports she's been sleeping well. She denies command hallucinations. She denies fevers, numbness, tingling, weakness, chest pain, shortness of breath, abdominal pain or rashes.   The history is provided by the patient and medical records. No language interpreter was used.    Past Medical History:  Diagnosis Date  . Chronic knee pain   . Schizophrenia [facility](HCC)     Patient Active Problem List   Diagnosis Date Noted  . Neuropathy (HCC) 11/22/2016  . Gastroesophageal reflux disease without esophagitis 11/22/2016  . Weight gain 11/22/2016  . Atypical chest pain 11/04/2016    Past Surgical History:  Procedure Laterality Date  . CHOLECYSTECTOMY      OB History    No data available       Home Medications    Prior to Admission medications   Medication Sig Start Date End Date Taking? Authorizing Provider  acetaminophen (TYLENOL) 500 MG tablet Take 1,000 mg by mouth every 6 (six) hours as needed for moderate  pain.    Historical Provider, MD  diclofenac (VOLTAREN) 75 MG EC tablet Take 1 tablet (75 mg total) by mouth 2 (two) times daily. 01/21/17   Dorena BodoLawrence Kennard, NP  gabapentin (NEURONTIN) 100 MG capsule TAKE 1 CAPSULE (100 MG TOTAL) BY MOUTH 3 (THREE) TIMES DAILY. 01/17/17   Massie MaroonLachina M Hollis, FNP  methocarbamol (ROBAXIN) 500 MG tablet Take 1 tablet (500 mg total) by mouth 2 (two) times daily. 12/29/16   Hannah Muthersbaugh, PA-C  omeprazole (PRILOSEC) 40 MG capsule Take 1 capsule (40 mg total) by mouth daily. Patient not taking: Reported on 11/28/2016 11/23/16   Shon Batonourtney F Horton, MD    Family History No family history on file.  Social History Social History  Substance Use Topics  . Smoking status: Never Smoker  . Smokeless tobacco: Never Used  . Alcohol use No     Allergies   Patient has no known allergies.   Review of Systems Review of Systems  Constitutional: Negative for fever.  Respiratory: Negative for cough and shortness of breath.   Cardiovascular: Negative for chest pain.  Gastrointestinal: Negative for abdominal pain.  Musculoskeletal: Positive for arthralgias.  Skin: Negative for rash.  Neurological: Negative for weakness and numbness.     Physical Exam Updated Vital Signs BP 155/74 (BP Location: Left Arm)   Pulse 71   Temp 97.8 F (36.6 C) (Oral)   Resp 16   SpO2 97%   Physical Exam  Constitutional: She appears well-developed and well-nourished. No distress.  Nontoxic appearing.  HENT:  Head: Normocephalic and atraumatic.  Eyes: Conjunctivae are normal. Pupils are equal, round, and reactive to light. Right eye exhibits no discharge. Left eye exhibits no discharge.  Neck: Neck supple.  Cardiovascular: Normal rate, regular rhythm, normal heart sounds and intact distal pulses.   Bilateral radial pulses are intact.  Pulmonary/Chest: Effort normal and breath sounds normal. No respiratory distress.  Abdominal: Soft. There is no tenderness.  Musculoskeletal: Normal  range of motion. She exhibits no edema, tenderness or deformity.  Good range of motion of her right shoulder and left knee. No overlying skin changes noted to her joints. Patient's bilateral shoulder, elbow, wrist, hip, knee and ankle joints are supple and nontender to palpation. No lower extremity edema or tenderness. Good grip strengths bilaterally.  Lymphadenopathy:    She has no cervical adenopathy.  Neurological: She is alert. Coordination normal.  Skin: Skin is warm and dry. Capillary refill takes less than 2 seconds. No rash noted. She is not diaphoretic. No erythema. No pallor.  Psychiatric: Her behavior is normal. Her mood appears not anxious. Her speech is not rapid and/or pressured. She does not exhibit a depressed mood. She expresses no homicidal and no suicidal ideation.  Patient does not appear to be responding to internal stimuli. She has good eye contact. She denies homicidal or suicidal ideations. Speech is clear and coherent. She can provide a good history. She denies command hallucinations. She endorses seeing Globe that follows her around in her apartment at times. She denies current visual or auditory hallucinations.  Nursing note and vitals reviewed.    ED Treatments / Results  Labs (all labs ordered are listed, but only abnormal results are displayed) Labs Reviewed - No data to display  EKG  EKG Interpretation None       Radiology No results found.  Procedures Procedures (including critical care time)  Medications Ordered in ED Medications  acetaminophen (TYLENOL) tablet 650 mg (not administered)     Initial Impression / Assessment and Plan / ED Course  I have reviewed the triage vital signs and the nursing notes.  Pertinent labs & imaging results that were available during my care of the patient were reviewed by me and considered in my medical decision making (see chart for details).    This is a 61 y.o. Female with a history of chronic right shoulder  pain, chronic left knee pain and schizophrenia who presents to the emergency department complaining of her ongoing chronic right shoulder and left knee pain. She denies any new injury or trauma to her joints. She denies any recent fall. She does me her pain is improved with the naproxen I prescribed for her the last time I saw her. Contrary to nursing of patient does not have a history of MS and denies history of MS to me. She does have a history of schizophrenia and follows at [facility] behavioral [facility]. She does me sometimes she thinks a bubble is following her and is contributing to her chronic pain. She denies SI or HI. She denies visual or auditory hallucinations currently. She denies command hallucinations. On exam the patient is afebrile nontoxic appearing. She has no acute findings on exam of her joints. There are supple and nontender. No evidence of septic joint. She is neurovascular intact. She does not appear to be responding to internal stimuli during my exam. She has good eye contact and provides good history. She does have a  history of schizophrenia and does endorse at times seeing Globe that is contributing to her pain. I suspect this is why the patient has been in to the emergency department several times recently. She follows at [facility], but cannot tell me the last time she was there. She is due to have injections for her schizophrenia. I encouraged her several times to follow-up at [facility]. She tells me she will. I see no need for inpatient psychiatric care. She does not appear to be a danger to herself or others and does not appear to be acutely psychotic.  She can continue using naproxen as I prescribed last time for her pain. Also encouraged using ice and Tylenol. I advised the patient to follow-up with their primary care provider this week. I advised the patient to return to the emergency department with new or worsening symptoms or new concerns. The patient verbalized understanding and  agreement with plan.      Final Clinical Impressions(s) / ED Diagnoses   Final diagnoses:  Chronic right shoulder pain  Chronic pain of left knee  Other schizophrenia [facility])    New Prescriptions New Prescriptions   No medications on file     Everlene Farrier, PA-C 01/26/17 0002    Shaune Pollack, MD 01/26/17 1147

## 2017-01-25 NOTE — ED Triage Notes (Signed)
Pt c/o generalized pain and neuropathy in her entire body. She reports that it is a result of her MS. She states that this pain started 5 days ago when she [facility]. A&Ox4. Ambulatory. Speaking in complete sentences.

## 2017-01-29 IMAGING — CR DG CHEST 2V
2 series · 2 of 2 positions shown · non-contrast
Comparison: 02/28/2016 heart size is upper limits normal.

CLINICAL DATA: Pt reports tightness and burning in the center of
her chest and shortness of breath for over 1 week; she reports she
was seen here for the same symptoms 1 week ago but still has
symptoms; non-smoker

EXAM:
CHEST  2 VIEW

[chest pa]
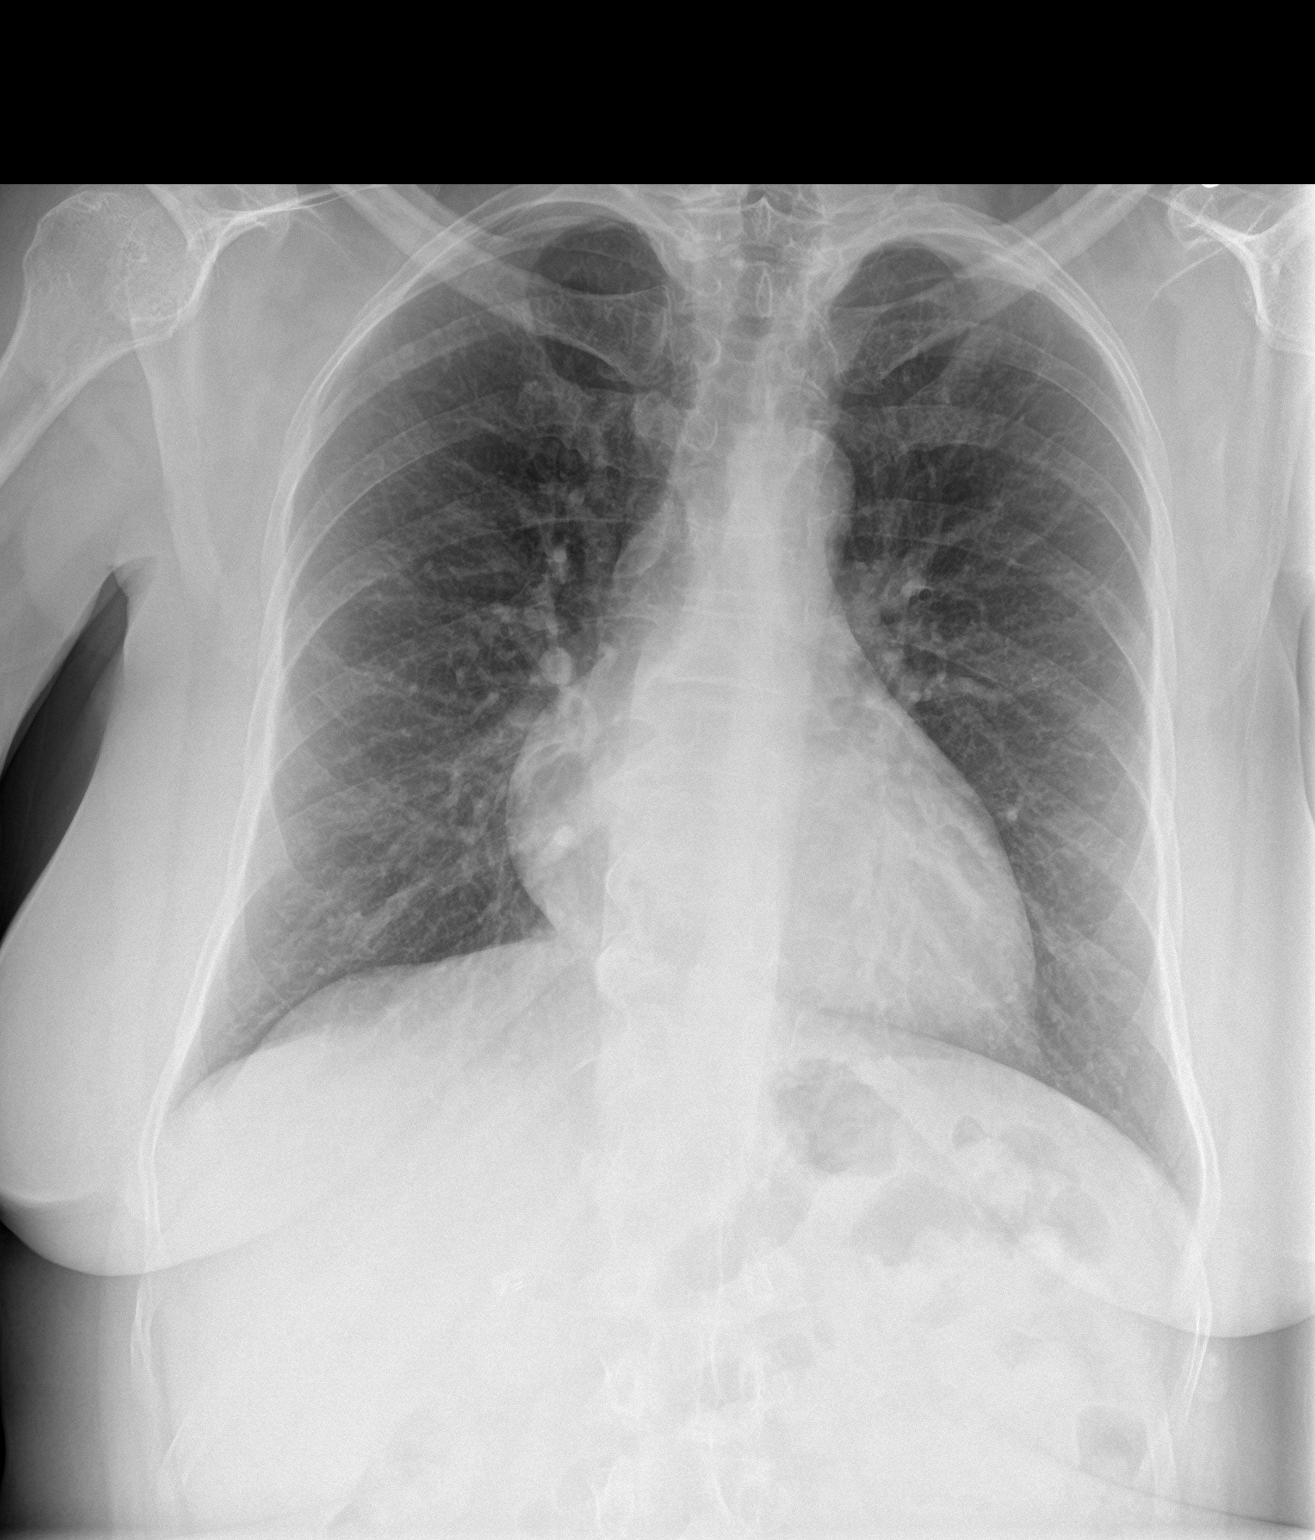

[chest lat]
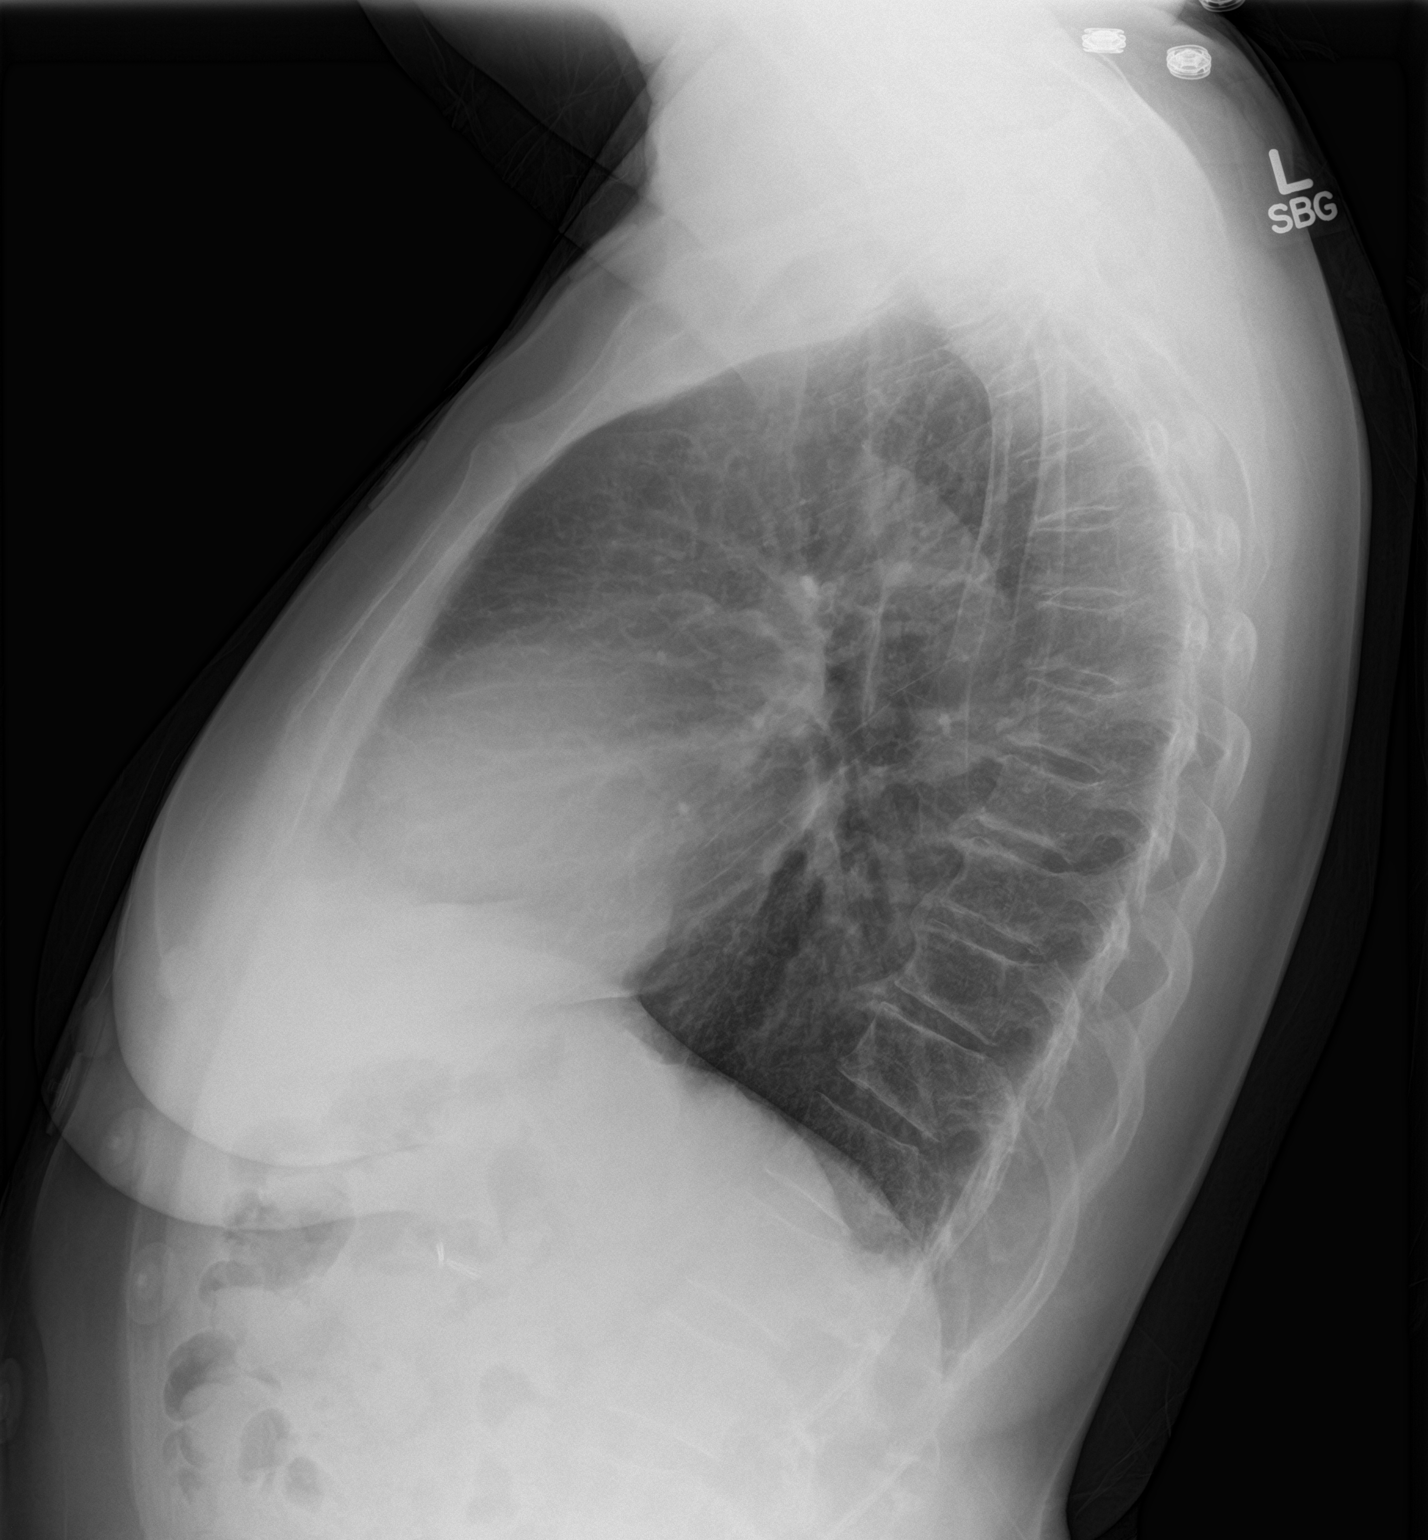

[2 of 2 positions shown; findings below may reference images not displayed]

There are
no focal consolidations or pleural effusions. No pulmonary edema.
Mid thoracic spondylosis. Surgical clips are noted in the right
upper quadrant of the abdomen.
FINDINGS: No evidence for acute cardiopulmonary abnormality.
IMPRESSION: No active cardiopulmonary disease.

## 2017-01-31 ENCOUNTER — Other Ambulatory Visit (HOSPITAL_COMMUNITY)
Admission: RE | Admit: 2017-01-31 | Discharge: 2017-01-31 | Disposition: A | Discharge status: Home / Self Care | Payer: Medicaid Other | Source: Ambulatory Visit | Attending: Internal Medicine | Admitting: Internal Medicine

## 2017-01-31 ENCOUNTER — Encounter: Payer: Self-pay | Admitting: Family Medicine

## 2017-01-31 ENCOUNTER — Ambulatory Visit (INDEPENDENT_AMBULATORY_CARE_PROVIDER_SITE_OTHER): Payer: Medicaid Other | Admitting: Family Medicine

## 2017-01-31 VITALS — BP 122/76 | HR 64 | Temp 98.9°F | Resp 18 | Ht 62.0 in | Wt 165.0 lb

## 2017-01-31 DIAGNOSIS — M5413 Radiculopathy, cervicothoracic region: Secondary | ICD-10-CM

## 2017-01-31 DIAGNOSIS — Z01419 Encounter for gynecological examination (general) (routine) without abnormal findings: Secondary | ICD-10-CM | POA: Diagnosis present

## 2017-01-31 DIAGNOSIS — Z1231 Encounter for screening mammogram for malignant neoplasm of breast: Secondary | ICD-10-CM

## 2017-01-31 DIAGNOSIS — Z1239 Encounter for other screening for malignant neoplasm of breast: Secondary | ICD-10-CM

## 2017-01-31 DIAGNOSIS — Z1211 Encounter for screening for malignant neoplasm of colon: Secondary | ICD-10-CM | POA: Diagnosis not present

## 2017-01-31 MED ORDER — GABAPENTIN 300 MG PO CAPS: 300.0000 mg | ORAL_CAPSULE | Freq: Three times a day (TID) | ORAL | 3 refills | Status: DC

## 2017-01-31 NOTE — Progress Notes (Signed)
Patient ID: Felicia LatheSheila Sperbeck, female    DOB: 22-May-1956, 61 y.o.   MRN: [id number]  PCP: Joaquin CourtsKimberly Nathaneil Feagans, FNP  Chief Complaint  Patient presents with  . Back Pain  . Hand Pain    right    Subjective:  HPI  Felicia Collier is a 61 y.o. female, presents for evaluation of back pain and gynecological exam. Past medical history includes arthralgia neck, shoulder, and low back pain, neuropathy, and schizophrenia.  Overdue Health Maintenance  Colonoscopy and mammogram, she would like to schedule both screenings today.  Back Pain Last digital imaging of lumbar spine in 11/2015 revealed degenerative spondylosis with grade 1 anterolisthesis at L4-5. She has presented to the ED multiple times for chronic complaints of back pain. She is currently taking Robaxin, Gabapentin, and Voltaren. Although she reports that she is currently out of gabapentin which seemed to help with the burning pain. Today she reports "burning " sensation radiating down from from C-7-L5 and bilateral shoulder burning. She has been taking ibuprofen with minimal relief, however, pain is present multiple times throughout the day.  Gyenocological exam Denies dysuria, vaginal discharge. At present not sexually active. Denies lower pelvic pressure or pain. Uncertain of last PAP or if she has ever had abnormal results.  Social History   Social History  . Marital status: Single    Spouse name: N/A  . Number of children: N/A  . Years of education: N/A   Occupational History  . Not on file.   Social History Main Topics  . Smoking status: Never Smoker  . Smokeless tobacco: Never Used  . Alcohol use No  . Drug use: No  . Sexual activity: Not on file   Other Topics Concern  . Not on file   Social History Narrative  . No narrative on file    No family history on file. Review of Systems See HPI Patient Active Problem List   Diagnosis Date Noted  . Neuropathy (HCC) 11/22/2016  . Gastroesophageal reflux disease without  esophagitis 11/22/2016  . Weight gain 11/22/2016  . Atypical chest pain 11/04/2016    No Known Allergies  Prior to Admission medications   Medication Sig Start Date End Date Taking? Authorizing Provider  acetaminophen (TYLENOL) 500 MG tablet Take 1,000 mg by mouth every 6 (six) hours as needed for moderate pain.   Yes Historical Provider, MD  diclofenac (VOLTAREN) 75 MG EC tablet Take 1 tablet (75 mg total) by mouth 2 (two) times daily. 01/21/17  Yes Dorena BodoLawrence Kennard, NP  gabapentin (NEURONTIN) 100 MG capsule TAKE 1 CAPSULE (100 MG TOTAL) BY MOUTH 3 (THREE) TIMES DAILY. 01/17/17  Yes Massie MaroonLachina M Hollis, FNP  methocarbamol (ROBAXIN) 500 MG tablet Take 1 tablet (500 mg total) by mouth 2 (two) times daily. 12/29/16  Yes Hannah Muthersbaugh, PA-C  omeprazole (PRILOSEC) 40 MG capsule Take 1 capsule (40 mg total) by mouth daily. 11/23/16  Yes Shon Batonourtney F Horton, MD    Past Medical, Surgical Family and Social History reviewed and updated.    Objective:   Vitals:   01/31/17 0857  BP: 122/76  Pulse: 64  Resp: 18  Temp: 98.9 F (37.2 C)     Wt Readings from Last 3 Encounters:  01/31/17 165 lb (74.8 kg)  01/18/17 162 lb (73.5 kg)  12/29/16 162 lb 8 oz (73.7 kg)   Physical Exam  Constitutional: She is oriented to person, place, and time. She appears well-developed and well-nourished.  HENT:  Head: Normocephalic and atraumatic.  Neck: Normal range  of motion. No thyroid mass and no thyromegaly present.  Cardiovascular: Normal rate, regular rhythm, normal heart sounds and intact distal pulses.   Pulmonary/Chest: Effort normal and breath sounds normal.  Abdominal: She exhibits no distension and no mass. There is no tenderness. There is no rebound and no guarding.  Genitourinary: Vagina normal and uterus normal. Cervix exhibits discharge. Cervix exhibits no motion tenderness and no friability. Right adnexum displays no mass, no tenderness and no fullness. Left adnexum displays no mass, no tenderness  and no fullness. No vaginal discharge found.  Musculoskeletal: She exhibits tenderness. She exhibits no edema.       Right shoulder: She exhibits tenderness and spasm. She exhibits no bony tenderness and no crepitus.       Thoracic back: She exhibits tenderness, pain and spasm. She exhibits no bony tenderness, no swelling and no edema.  Mild kyphosis of thoracic   Neurological: She is alert and oriented to person, place, and time. GCS eye subscore is 4. GCS verbal subscore is 5. GCS motor subscore is 6.  Reflex Scores:      Patellar reflexes are 2+ on the right side and 2+ on the left side.      Achilles reflexes are 2+ on the right side and 1+ on the left side.    Assessment & Plan:  1. Radiculopathy of cervicothoracic region Gabapentin-increase from 100 mg TID to 300 mg TID  2. Encounter for gynecological examination - Cytology - PAP  3. Screen for colon cancer - Ambulatory referral to Gastroenterology  4. Screening breast examination  -MM Digital Screening  Return in July for 4 months for a complete physical exam to include fasting labs.  I will notify you of your PAP results.   Godfrey Pick. Tiburcio Pea, MSN, [facility] [address] [place], [place] [address] [phone]

## 2017-01-31 NOTE — Patient Instructions (Addendum)
I have increased your Gabapentin from 100 mg 3 times daily to 300 mg 3 times daily. I am also referring you to neuropathy to have your worsening neuropathic pain evaluated. I have also placed a referral for you to obtain to colonoscopy and mammogram.  Please return in 4 months for a complete physical exam.    Radicular Pain Radicular pain is a type of pain that spreads from your back or neck along a spinal nerve. Spinal nerves are nerves that leave the spinal cord and go to the muscles. Radicular pain occurs when one of these nerves becomes irritated or squeezed (compressed). Radicular pain is sometimes called radiculopathy, radiculitis, or a pinched nerve. When you have this type of pain, you may also have weakness, numbness, or tingling in the area of your body that is supplied by the nerve. The pain may feel sharp and burning. Spinal nerves leave the spinal cord through openings between the 24 bones (vertebrae) that make up the spine. Radicular pain is often caused by something pushing on a spinal nerve. This pushing may be done by a vertebra or by one of the round cushions between vertebrae (intervertebral disks). This can result from an injury, from wear and tear or aging of a disk, or from the growth of a bone spur that pushes on the nerve. Radicular pain can occur in various areas depending on which spinal nerve is affected:  Cervical radicular pain occurs in the neck. You may also feel pain, numbness, weakness, or tingling in the arms.  Thoracic radicular pain occurs in the mid-spine area. You would feel this pain in the back and chest. This type is rare.  Lumbar radicular pain occurs in the lower back area. You would feel this pain as low back pain. You may feel pain, numbness, weakness, or tingling in the buttocks or legs. Sciatica is a type of lumbar radicular pain that shoots down the back of the leg. Radicular pain often goes away when you follow instructions from your health care  provider for relieving pain at home. Follow these instructions at home: Managing pain   If directed, apply ice to the affected area:  Put ice in a plastic bag.  Place a towel between your skin and the bag.  Leave the ice on for 20 minutes, 2-3 times a day.  If directed, apply heat to the affected area as often as told by your health care provider. Use the heat source that your health care provider recommends, such as a moist heat pack or a heating pad.  Place a towel between your skin and the heat source.  Leave the heat on for 20-30 minutes.  Remove the heat if your skin turns bright red. This is especially important if you are unable to feel pain, heat, or cold. You may have a greater risk of getting burned. Activity    Do not sit or rest in bed for long periods of time.  Try to stay as active as possible. Ask your health care provider what type of exercise or activity is best for you.  Avoid activities that make your pain worse, such as bending and lifting.  Do not lift anything that is heavier than 10 lb (4.5 kg). Practice using proper technique when lifting items. Proper lifting technique involves bending your knees and rising up.  Do strength and range-of-motion exercises only as told by your health care provider. General instructions   Take over-the-counter and prescription medicines only as told by your health  care provider.  Pay attention to any changes in your symptoms.  Keep all follow-up visits as told by your health care provider. This is important. Contact a health care provider if:  Your pain and other symptoms get worse.  Your pain medicine is not helping.  Your pain has not improved after a few weeks of home care.  You have a fever. Get help right away if:  You have severe pain, weakness, or numbness.  You have difficulty with bladder or bowel control. This information is not intended to replace advice given to you by your health care provider.  Make sure you discuss any questions you have with your health care provider. Document Released: 12/09/2004 Document Revised: 04/08/2016 Document Reviewed: 05/28/2015 Elsevier Interactive Patient Education  2017 [employer]

## 2017-02-01 ENCOUNTER — Other Ambulatory Visit: Payer: Self-pay | Admitting: Family Medicine

## 2017-02-01 DIAGNOSIS — Z1231 Encounter for screening mammogram for malignant neoplasm of breast: Secondary | ICD-10-CM

## 2017-02-02 ENCOUNTER — Encounter (HOSPITAL_COMMUNITY): Payer: Self-pay | Admitting: Emergency Medicine

## 2017-02-02 ENCOUNTER — Emergency Department (HOSPITAL_COMMUNITY)
Admission: EM | Admit: 2017-02-02 | Discharge: 2017-02-02 | Disposition: A | Discharge status: Home / Self Care | Payer: Medicaid Other | Attending: Emergency Medicine | Admitting: Emergency Medicine

## 2017-02-02 DIAGNOSIS — M79672 Pain in left foot: Secondary | ICD-10-CM | POA: Diagnosis not present

## 2017-02-02 DIAGNOSIS — Z79899 Other long term (current) drug therapy: Secondary | ICD-10-CM | POA: Insufficient documentation

## 2017-02-02 LAB — CYTOLOGY - PAP: DIAGNOSIS: NEGATIVE

## 2017-02-02 NOTE — Discharge Instructions (Signed)
Continue your Naprosyn for pain. Rest the foot as much as possible. See your doctor if pain continues.

## 2017-02-02 NOTE — ED Provider Notes (Signed)
[facility]-EMERGENCY DEPT Provider Note   CSN: [id number] Arrival date & time: 02/02/17  0112     History   Chief Complaint Chief Complaint  Patient presents with  . Foot Pain    HPI Felicia Collier is a 61 y.o. female.  Patient complains of left foot pain x 2 days without known injury. Pain affects the base of 2nd through 5th toes. She endorses mild swelling. No fever, wounds or discoloration.   The history is provided by the patient. No language interpreter was used.    Past Medical History:  Diagnosis Date  . Chronic knee pain   . Schizophrenia [facility](HCC)     Patient Active Problem List   Diagnosis Date Noted  . Neuropathy (HCC) 11/22/2016  . Gastroesophageal reflux disease without esophagitis 11/22/2016  . Weight gain 11/22/2016  . Atypical chest pain 11/04/2016    Past Surgical History:  Procedure Laterality Date  . CHOLECYSTECTOMY      OB History    No data available       Home Medications    Prior to Admission medications   Medication Sig Start Date End Date Taking? Authorizing Provider  acetaminophen (TYLENOL) 500 MG tablet Take 1,000 mg by mouth every 6 (six) hours as needed for moderate pain.    Historical Provider, MD  diclofenac (VOLTAREN) 75 MG EC tablet Take 1 tablet (75 mg total) by mouth 2 (two) times daily. 01/21/17   Dorena BodoLawrence Kennard, NP  gabapentin (NEURONTIN) 300 MG capsule Take 1 capsule (300 mg total) by mouth 3 (three) times daily. 01/31/17   Doyle AskewKimberly Stephenia Harris, FNP  methocarbamol (ROBAXIN) 500 MG tablet Take 1 tablet (500 mg total) by mouth 2 (two) times daily. 12/29/16   Hannah Muthersbaugh, PA-C  omeprazole (PRILOSEC) 40 MG capsule Take 1 capsule (40 mg total) by mouth daily. 11/23/16   Shon Batonourtney F Horton, MD    Family History No family history on file.  Social History Social History  Substance Use Topics  . Smoking status: Never Smoker  . Smokeless tobacco: Never Used  . Alcohol use No     Allergies   Patient has no known  allergies.   Review of Systems Review of Systems  Cardiovascular: Negative.  Negative for leg swelling.  Musculoskeletal:       See HPI.  Skin: Negative.  Negative for color change and wound.  Neurological: Negative.  Negative for numbness.     Physical Exam Updated Vital Signs BP 137/87 (BP Location: Right Arm)   Pulse 66   Temp 97.6 F (36.4 C) (Oral)   Resp 16   SpO2 98%   Physical Exam  Constitutional: She is oriented to person, place, and time. She appears well-developed and well-nourished.  Neck: Normal range of motion.  Cardiovascular: Intact distal pulses.   Pulmonary/Chest: Effort normal.  Musculoskeletal:  Left foot unremarkable in appearance. No swelling, redness or deformity. Minimally tender dorsally across base of toes. FROM maintained. No skin breakdown, wound or ulceration of foot.   Neurological: She is alert and oriented to person, place, and time.  Skin: Skin is warm and dry.     ED Treatments / Results  Labs (all labs ordered are listed, but only abnormal results are displayed) Labs Reviewed - No data to display  EKG  EKG Interpretation None       Radiology No results found.  Procedures Procedures (including critical care time)  Medications Ordered in ED Medications - No data to display   Initial Impression / Assessment  and Plan / ED Course  I have reviewed the triage vital signs and the nursing notes.  Pertinent labs & imaging results that were available during my care of the patient were reviewed by me and considered in my medical decision making (see chart for details).     Patient with likely soft tissue musculoskeletal pain of left foot. Do not suspect vascular compromise or infection. Recommended supportive care.   Final Clinical Impressions(s) / ED Diagnoses   Final diagnoses:  None   1. 1. Left foot pain  New Prescriptions New Prescriptions   No medications on file     Danne Harbor 02/02/17 0240      Dione Booze, MD 02/02/17 [phone]

## 2017-02-02 NOTE — ED Triage Notes (Signed)
Pt c/o left foot pain to the outer side of her foot; no swelling, no redness; pt is able to move toes; denies injury and states that her foot "just started hurting" after leaving the doctor yesterday

## 2017-02-02 NOTE — ED Notes (Signed)
Pt c/o of left foot pain and swelling tha is 7/10 and described as stinging. Pt states that she was having left knee pain that radiates down to her foot. Postive pedal pulses x 2, mild swelling to left foot is noted.

## 2017-02-03 ENCOUNTER — Encounter (HOSPITAL_COMMUNITY): Payer: Self-pay | Admitting: Family Medicine

## 2017-02-09 ENCOUNTER — Ambulatory Visit (INDEPENDENT_AMBULATORY_CARE_PROVIDER_SITE_OTHER): Payer: Medicaid Other | Admitting: Orthopaedic Surgery

## 2017-02-09 DIAGNOSIS — M25562 Pain in left knee: Secondary | ICD-10-CM | POA: Diagnosis not present

## 2017-02-09 DIAGNOSIS — G8929 Other chronic pain: Secondary | ICD-10-CM | POA: Insufficient documentation

## 2017-02-09 DIAGNOSIS — M1712 Unilateral primary osteoarthritis, left knee: Secondary | ICD-10-CM | POA: Insufficient documentation

## 2017-02-09 MED ORDER — METHYLPREDNISOLONE ACETATE 40 MG/ML IJ SUSP
40.0000 mg | INTRAMUSCULAR | Status: AC | PRN
  Administered 2017-02-09: 40 mg via INTRA_ARTICULAR

## 2017-02-09 NOTE — Progress Notes (Signed)
Office Visit Note   Patient: Felicia Collier           Date of Birth: 12/08/1955           MRN: [id number] Visit Date: 02/09/2017              Requested by: Henrietta HooverLinda C Bernhardt, NP [address] [place], [place] [id number] PCP: Joaquin CourtsKimberly Harris, FNP   Assessment & Plan: Visit Diagnoses:  1. Chronic pain of left knee   2. Unilateral primary osteoarthritis, left knee     Plan: She tolerated the steroid injection will on her left knee. I be hesitant about performing knee replacement surgery given her current psychologic status however she may eventually be a candidate for knee replacement surgery. I encouraged her to stop going to the emergency room for aches and pains. She should take just Aleve or ibuprofen combined with Tylenol for pain and inflammation. We can always see her back for repeat injection in 3 months.  Follow-Up Instructions: Return in about 3 months (around 05/12/2017).   Orders:  Orders Placed This Encounter  Procedures  . Large Joint Injection/Arthrocentesis   No orders of the defined types were placed in this encounter.     Procedures: Large Joint Inj Date/Time: 02/09/2017 2:38 PM Performed by: Kathryne HitchBLACKMAN, CHRISTOPHER Y Authorized by: Kathryne HitchBLACKMAN, CHRISTOPHER Y   Location:  Knee Site:  L knee Ultrasound Guidance: No   Fluoroscopic Guidance: No   Arthrogram: No   Medications:  40 mg methylPREDNISolone acetate 40 MG/ML     Clinical Data: No additional findings.   Subjective: No chief complaint on file. The patient mainly has chief complaint of left knee pain. She has been in and out of the ER and this office as well with knee pain. She has a history of schizophrenia as well. She has history of a left knee arthroscopy with debridement of the medial compartment of her knee as well as a partial medial meniscectomy. Her pain is daily and is detrimentally affected her activities daily living her quality of life and her mobility. She is not had a injection along.  Time.  HPI  Review of Systems He currently denies any recent illnesses. She denies any fever chills nausea vomiting chest pain headache or shortness of breath.  Objective: Vital Signs: There were no vitals taken for this visit.  Physical Exam She is alert and oriented 3 and in no acute or obvious distress Ortho Exam Examination of her left knee shows a mild varus deformity. She has significant patellofemoral crepitation and mainly medial joint line tenderness. X-rays on the cone system of her left knee does show medial compartment and patellofemoral compartment arthritic changes. 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 Specialty Comments:  No specialty comments available.  Imaging: No results found.   PMFS History: Patient Active Problem List   Diagnosis Date Noted  . Chronic pain of left knee 02/09/2017  . Unilateral primary osteoarthritis, left knee 02/09/2017  . Neuropathy (HCC) 11/22/2016  . Gastroesophageal reflux disease without esophagitis 11/22/2016  . Weight gain 11/22/2016  . Atypical chest pain 11/04/2016   Past Medical History:  Diagnosis Date  . Chronic knee pain   . Schizophrenia (HCC)     No family history on file.  Past Surgical History:  Procedure Laterality Date  . CHOLECYSTECTOMY     Social History   Occupational History  . Not on file.  Social History Main Topics  . Smoking status: Never Smoker  . Smokeless tobacco: Never Used  . Alcohol use No  . Drug use: No  . Sexual activity: Not on file

## 2017-02-22 ENCOUNTER — Ambulatory Visit
Admission: RE | Admit: 2017-02-22 | Discharge: 2017-02-22 | Disposition: A | Discharge status: Home / Self Care | Payer: Medicaid Other | Source: Ambulatory Visit | Attending: Family Medicine | Admitting: Family Medicine

## 2017-02-22 DIAGNOSIS — Z1231 Encounter for screening mammogram for malignant neoplasm of breast: Secondary | ICD-10-CM

## 2017-02-28 ENCOUNTER — Emergency Department (HOSPITAL_COMMUNITY)
Admission: EM | Admit: 2017-02-28 | Discharge: 2017-03-01 | Disposition: A | Discharge status: Home / Self Care | Payer: Medicaid Other | Attending: Emergency Medicine | Admitting: Emergency Medicine

## 2017-02-28 ENCOUNTER — Encounter (HOSPITAL_COMMUNITY): Payer: Self-pay | Admitting: Emergency Medicine

## 2017-02-28 DIAGNOSIS — E876 Hypokalemia: Secondary | ICD-10-CM | POA: Diagnosis not present

## 2017-02-28 DIAGNOSIS — R1084 Generalized abdominal pain: Secondary | ICD-10-CM | POA: Diagnosis present

## 2017-02-28 DIAGNOSIS — Z79899 Other long term (current) drug therapy: Secondary | ICD-10-CM | POA: Insufficient documentation

## 2017-02-28 LAB — CBC WITH DIFFERENTIAL/PLATELET
BASOS ABS: 0 10*3/uL (ref 0.0–0.1)
BASOS PCT: 0 %
EOS PCT: 0 %
Eosinophils Absolute: 0 10*3/uL (ref 0.0–0.7)
HEMATOCRIT: 39.6 % (ref 36.0–46.0)
Hemoglobin: 12.6 g/dL (ref 12.0–15.0)
Lymphocytes Relative: 6 %
Lymphs Abs: 0.7 10*3/uL (ref 0.7–4.0)
MCH: 25.9 pg — ABNORMAL LOW (ref 26.0–34.0)
MCHC: 31.8 g/dL (ref 30.0–36.0)
MCV: 81.3 fL (ref 78.0–100.0)
MONO ABS: 0.3 10*3/uL (ref 0.1–1.0)
MONOS PCT: 2 %
Neutro Abs: 11.1 10*3/uL — ABNORMAL HIGH (ref 1.7–7.7)
Neutrophils Relative %: 92 %
PLATELETS: 284 10*3/uL (ref 150–400)
RBC: 4.87 MIL/uL (ref 3.87–5.11)
RDW: 14.2 % (ref 11.5–15.5)
WBC: 12.2 10*3/uL — ABNORMAL HIGH (ref 4.0–10.5)

## 2017-02-28 LAB — COMPREHENSIVE METABOLIC PANEL
ALBUMIN: 3.6 g/dL (ref 3.5–5.0)
ALK PHOS: 82 U/L (ref 38–126)
ALT: 15 U/L (ref 14–54)
AST: 22 U/L (ref 15–41)
Anion gap: 10 (ref 5–15)
BILIRUBIN TOTAL: 1 mg/dL (ref 0.3–1.2)
BUN: 8 mg/dL (ref 6–20)
CO2: 26 mmol/L (ref 22–32)
CREATININE: 0.85 mg/dL (ref 0.44–1.00)
Calcium: 8.7 mg/dL — ABNORMAL LOW (ref 8.9–10.3)
Chloride: 103 mmol/L (ref 101–111)
GFR calc Af Amer: >60 mL/min (ref >60)
GLUCOSE: 104 mg/dL — AB (ref 65–99)
Potassium: 2.9 mmol/L — ABNORMAL LOW (ref 3.5–5.1)
Sodium: 139 mmol/L (ref 135–145)
TOTAL PROTEIN: 6.7 g/dL (ref 6.5–8.1)

## 2017-02-28 LAB — URINALYSIS, ROUTINE W REFLEX MICROSCOPIC
BACTERIA UA: NONE SEEN
Bilirubin Urine: NEGATIVE
Glucose, UA: NEGATIVE mg/dL
Hgb urine dipstick: NEGATIVE
Ketones, ur: NEGATIVE mg/dL
Nitrite: NEGATIVE
Protein, ur: NEGATIVE mg/dL
SPECIFIC GRAVITY, URINE: 1.006 (ref 1.005–1.030)
pH: 6 (ref 5.0–8.0)

## 2017-02-28 LAB — LIPASE, BLOOD: LIPASE: 23 U/L (ref 11–51)

## 2017-02-28 MED ORDER — POTASSIUM CHLORIDE CRYS ER 20 MEQ PO TBCR
40.0000 meq | EXTENDED_RELEASE_TABLET | Freq: Once | ORAL | Status: AC
  Administered 2017-02-28: 40 meq via ORAL
  Filled 2017-02-28: qty 2

## 2017-02-28 MED ORDER — POTASSIUM CHLORIDE ER 10 MEQ PO TBCR: 10.0000 meq | EXTENDED_RELEASE_TABLET | Freq: Every day | ORAL | 0 refills | Status: DC

## 2017-02-28 MED ORDER — DICYCLOMINE HCL 20 MG PO TABS: 20.0000 mg | ORAL_TABLET | Freq: Two times a day (BID) | ORAL | 0 refills | Status: DC

## 2017-02-28 MED ORDER — ONDANSETRON 4 MG PO TBDP: 4.0000 mg | ORAL_TABLET | Freq: Three times a day (TID) | ORAL | 0 refills | Status: DC | PRN

## 2017-02-28 MED ORDER — DICYCLOMINE HCL 10 MG PO CAPS
20.0000 mg | ORAL_CAPSULE | Freq: Once | ORAL | Status: AC
  Administered 2017-02-28: 20 mg via ORAL
  Filled 2017-02-28: qty 2

## 2017-02-28 MED ORDER — ONDANSETRON 4 MG PO TBDP
4.0000 mg | ORAL_TABLET | Freq: Once | ORAL | Status: AC
  Administered 2017-02-28: 4 mg via ORAL
  Filled 2017-02-28: qty 1

## 2017-02-28 NOTE — ED Triage Notes (Signed)
Pt. Stated, I've had stomach pain since I had my mammogram over a week,. Denies any other symptom with the stomach pain.

## 2017-02-28 NOTE — ED Notes (Signed)
Patient given medications with water. While attempting to swalllow potassium tablets, patient vomited on the floor. Will notify EDP.

## 2017-02-28 NOTE — ED Notes (Addendum)
Labs drawn at this time. 

## 2017-02-28 NOTE — Discharge Instructions (Addendum)
You had evidence of low potassium. Take the supplement, as prescribed. Follow up with your primary care provider as soon as possible for continued management and retesting. See additional instructions below:   Hand washing: Wash your hands throughout the day, but especially before and after touching the face, using the restroom, sneezing, coughing, or touching surfaces that have been coughed or sneezed upon. Hydration: Symptoms will be intensified and complicated by dehydration. Dehydration can also extend the duration of symptoms. Drink plenty of fluids and get plenty of rest. You should be drinking at least half a liter of water an hour to stay hydrated. Electrolyte drinks are also encouraged. You should be drinking enough fluids to make your urine light yellow, almost clear. If this is not the case, you are not drinking enough water. Please note that some of the treatments indicated below will not be effective if you are not adequately hydrated. Pain or fever: Ibuprofen, Naproxen, or Tylenol for pain or fever.  Nausea/vomiting: Use the Zofran for nausea or vomiting.  Abdominal discomfort: May use the Bentyl as needed for abdominal cramping.  Follow up: Follow up with a primary care provider, as needed, for any future management of this issue. Return: Return to the ED should symptoms worsen.

## 2017-02-28 NOTE — ED Provider Notes (Signed)
[facility]-EMERGENCY DEPT Provider Note   CSN: [id number] Arrival date & time: 02/28/17  1658     History   Chief Complaint Chief Complaint  Patient presents with  . Abdominal Pain    HPI Felicia Collier is a 61 y.o. female.  HPI   Felicia Collier is a 61 y.o. female, with a history of Schizophrenia, presenting to the ED with abdominal pain beginning 4/9. Pain is generalized, cramping, rated 7/10, nonradiating. Pain is constant and does change with eating. Pain began after patient had a mammogram on 4/9. Also endorses nausea, bad taste in her mouth, and diarrhea. Some of her symptoms improved with eating gummy bears. Tylenol has also helped.   Patient has a PCP appointment on 4/18 for this issue.   Denies fever/chills, vomiting, hematochezia/melena, chest pain, shortness of breath, urinary complaints, abnormal vaginal bleeding or discharge, or any other complaints.    Past Medical History:  Diagnosis Date  . Chronic knee pain   . Schizophrenia [facility])     Patient Active Problem List   Diagnosis Date Noted  . Chronic pain of left knee 02/09/2017  . Unilateral primary osteoarthritis, left knee 02/09/2017  . Neuropathy 11/22/2016  . Gastroesophageal reflux disease without esophagitis 11/22/2016  . Weight gain 11/22/2016  . Atypical chest pain 11/04/2016    Past Surgical History:  Procedure Laterality Date  . CHOLECYSTECTOMY      OB History    No data available       Home Medications    Prior to Admission medications   Medication Sig Start Date End Date Taking? Authorizing Provider  acetaminophen (TYLENOL) 500 MG tablet Take 1,000 mg by mouth every 6 (six) hours as needed for moderate pain.   Yes Historical Provider, MD  diclofenac (VOLTAREN) 75 MG EC tablet Take 1 tablet (75 mg total) by mouth 2 (two) times daily. Patient taking differently: Take 75 mg by mouth 2 (two) times daily as needed for mild pain.  01/21/17  Yes Dorena Bodo, NP  gabapentin (NEURONTIN) 300 MG  capsule Take 1 capsule (300 mg total) by mouth 3 (three) times daily. Patient taking differently: Take 300 mg by mouth 3 (three) times daily as needed (pain).  01/31/17  Yes Doyle Askew, FNP  methocarbamol (ROBAXIN) 500 MG tablet Take 1 tablet (500 mg total) by mouth 2 (two) times daily. Patient taking differently: Take 500 mg by mouth 2 (two) times daily as needed for muscle spasms.  12/29/16  Yes Hannah Muthersbaugh, PA-C  dicyclomine (BENTYL) 20 MG tablet Take 1 tablet (20 mg total) by mouth 2 (two) times daily. 02/28/17   Shawn C Joy, PA-C  ondansetron (ZOFRAN ODT) 4 MG disintegrating tablet Take 1 tablet (4 mg total) by mouth every 8 (eight) hours as needed for nausea or vomiting. 02/28/17   Shawn C Joy, PA-C  potassium chloride (K-DUR) 10 MEQ tablet Take 1 tablet (10 mEq total) by mouth daily. 02/28/17   Anselm Pancoast, PA-C    Family History No family history on file.  Social History Social History  Substance Use Topics  . Smoking status: Never Smoker  . Smokeless tobacco: Never Used  . Alcohol use No     Allergies   Patient has no known allergies.   Review of Systems Review of Systems  Constitutional: Negative for chills, diaphoresis and fever.  Respiratory: Negative for shortness of breath.   Cardiovascular: Negative for chest pain.  Gastrointestinal: Positive for abdominal pain, diarrhea and nausea. Negative for blood in stool  and vomiting.  Genitourinary: Negative for dysuria and hematuria.  Musculoskeletal: Negative for back pain.  All other systems reviewed and are negative.    Physical Exam Updated Vital Signs BP 123/69 (BP Location: Right Arm)   Pulse 88   Temp 98.4 F (36.9 C) (Oral)   Resp (!) 24   Ht  (1.626 m)   Wt 76.2 kg   SpO2 95%   BMI 28.84 kg/m   Physical Exam  Constitutional: She appears well-developed and well-nourished. No distress.  HENT:  Head: Normocephalic and atraumatic.  Eyes: Conjunctivae are normal.  Neck: Neck  supple.  Cardiovascular: Normal rate, regular rhythm, normal heart sounds and intact distal pulses.   Pulmonary/Chest: Effort normal and breath sounds normal. No respiratory distress.  Abdominal: Soft. There is no tenderness. There is no guarding.  Patient verbally indicates generalized tenderness, but shows no other reaction or grimace.  Musculoskeletal: She exhibits no edema.  Lymphadenopathy:    She has no cervical adenopathy.  Neurological: She is alert.  Skin: Skin is warm and dry. She is not diaphoretic.  Psychiatric: She has a normal mood and affect. Her behavior is normal.  Nursing note and vitals reviewed.    ED Treatments / Results  Labs (all labs ordered are listed, but only abnormal results are displayed) Labs Reviewed  URINALYSIS, ROUTINE W REFLEX MICROSCOPIC - Abnormal; Notable for the following:       Result Value   Color, Urine STRAW (*)    Leukocytes, UA SMALL (*)    Squamous Epithelial / LPF 0-5 (*)    All other components within normal limits  COMPREHENSIVE METABOLIC PANEL - Abnormal; Notable for the following:    Potassium 2.9 (*)    Glucose, Bld 104 (*)    Calcium 8.7 (*)    All other components within normal limits  CBC WITH DIFFERENTIAL/PLATELET - Abnormal; Notable for the following:    WBC 12.2 (*)    MCH 25.9 (*)    Neutro Abs 11.1 (*)    All other components within normal limits  LIPASE, BLOOD    EKG  EKG Interpretation None      EKG without acute changes consistent with acute hypokalemia.  Radiology No results found.  Procedures Procedures (including critical care time)  Medications Ordered in ED Medications  ondansetron (ZOFRAN-ODT) disintegrating tablet 4 mg (4 mg Oral Given 02/28/17 2324)  dicyclomine (BENTYL) capsule 20 mg (20 mg Oral Given 02/28/17 2323)  potassium chloride SA (K-DUR,KLOR-CON) CR tablet 40 mEq (40 mEq Oral Given 02/28/17 2323)     Initial Impression / Assessment and Plan / ED Course  I have reviewed the triage  vital signs and the nursing notes.  Pertinent labs & imaging results that were available during my care of the patient were reviewed by me and considered in my medical decision making (see chart for details).  Clinical Course as of Mar 01 28  Mon Feb 28, 2017  2335 Patient apparently gagged on the large potassium supplement tablet, causing her to vomit. However, patient was able to eat and drink without difficulty or vomiting.  [SJ]  2338 Previous instances of similar potassium levels noted in patient's chart. Patient is asymptomatic to this at this time. Potassium: (!) 2.9 [SJ]    Clinical Course User Index [SJ] Anselm Pancoast, PA-C   Patient presents with generalized abdominal cramping, nausea, vomiting, and diarrhea. Patient is nontoxic appearing, afebrile, not tachycardic, not tachypneic, not hypotensive, maintains excellent SPO2 on room air, and  is in no apparent distress. Patient has no signs of sepsis or other serious or life-threatening condition. Upon reassessment, patient is sleeping and has to be woken for further assessment.  The patient was given instructions for home care as well as return precautions. Patient voices understanding of these instructions, accepts the plan, and is comfortable with discharge.    Vitals:   02/28/17 1742 02/28/17 2130 02/28/17 2145 02/28/17 2330  BP: 123/69 (!) 143/67 130/78 140/77  Pulse: 88 65 74 90  Resp: (!) 24     Temp: 98.4 F (36.9 C)     TempSrc: Oral     SpO2: 95% 97% 96% 99%  Weight:      Height:        Final Clinical Impressions(s) / ED Diagnoses   Final diagnoses:  Generalized abdominal pain  Hypokalemia    New Prescriptions New Prescriptions   DICYCLOMINE (BENTYL) 20 MG TABLET    Take 1 tablet (20 mg total) by mouth 2 (two) times daily.   ONDANSETRON (ZOFRAN ODT) 4 MG DISINTEGRATING TABLET    Take 1 tablet (4 mg total) by mouth every 8 (eight) hours as needed for nausea or vomiting.   POTASSIUM CHLORIDE (K-DUR) 10 MEQ  TABLET    Take 1 tablet (10 mEq total) by mouth daily.     Anselm Pancoast, PA-C 03/01/17 0030    Lavera Guise, MD 03/01/17 [phone]

## 2017-03-02 ENCOUNTER — Encounter: Payer: Self-pay | Admitting: Family Medicine

## 2017-03-02 ENCOUNTER — Ambulatory Visit (HOSPITAL_COMMUNITY)
Admission: RE | Admit: 2017-03-02 | Discharge: 2017-03-02 | Disposition: A | Discharge status: Home / Self Care | Payer: Medicaid Other | Source: Ambulatory Visit | Attending: Family Medicine | Admitting: Family Medicine

## 2017-03-02 ENCOUNTER — Ambulatory Visit (INDEPENDENT_AMBULATORY_CARE_PROVIDER_SITE_OTHER): Payer: Medicaid Other | Admitting: Family Medicine

## 2017-03-02 VITALS — BP 132/80 | HR 64 | Temp 98.7°F | Resp 16 | Ht 64.0 in | Wt 163.0 lb

## 2017-03-02 DIAGNOSIS — K921 Melena: Secondary | ICD-10-CM

## 2017-03-02 DIAGNOSIS — R300 Dysuria: Secondary | ICD-10-CM | POA: Diagnosis not present

## 2017-03-02 DIAGNOSIS — K449 Diaphragmatic hernia without obstruction or gangrene: Secondary | ICD-10-CM | POA: Insufficient documentation

## 2017-03-02 DIAGNOSIS — E876 Hypokalemia: Secondary | ICD-10-CM | POA: Diagnosis not present

## 2017-03-02 DIAGNOSIS — N200 Calculus of kidney: Secondary | ICD-10-CM | POA: Insufficient documentation

## 2017-03-02 DIAGNOSIS — R1084 Generalized abdominal pain: Secondary | ICD-10-CM | POA: Diagnosis present

## 2017-03-02 LAB — CBC WITH DIFFERENTIAL/PLATELET
BASOS PCT: 0 %
Basophils Absolute: 0 cells/uL (ref 0–200)
Eosinophils Absolute: 126 cells/uL (ref 15–500)
Eosinophils Relative: 2 %
HEMATOCRIT: 38.9 % (ref 35.0–45.0)
HEMOGLOBIN: 12.2 g/dL (ref 11.7–15.5)
LYMPHS ABS: 1008 {cells}/uL (ref 850–3900)
Lymphocytes Relative: 16 %
MCH: 25.4 pg — ABNORMAL LOW (ref 27.0–33.0)
MCHC: 31.4 g/dL — AB (ref 32.0–36.0)
MCV: 81 fL (ref 80.0–100.0)
MONO ABS: 630 {cells}/uL (ref 200–950)
MPV: 10 fL (ref 7.5–12.5)
Monocytes Relative: 10 %
NEUTROS PCT: 72 %
Neutro Abs: 4536 cells/uL (ref 1500–7800)
Platelets: 270 10*3/uL (ref 140–400)
RBC: 4.8 MIL/uL (ref 3.80–5.10)
RDW: 13.8 % (ref 11.0–15.0)
WBC: 6.3 10*3/uL (ref 3.8–10.8)

## 2017-03-02 LAB — BASIC METABOLIC PANEL
BUN: 8 mg/dL (ref 7–25)
CO2: 27 mmol/L (ref 20–31)
Calcium: 8.6 mg/dL (ref 8.6–10.4)
Chloride: 102 mmol/L (ref 98–110)
Creat: 0.74 mg/dL (ref 0.50–1.05)
Glucose, Bld: 91 mg/dL (ref 65–99)
POTASSIUM: 3.1 mmol/L — AB (ref 3.5–5.3)
SODIUM: 137 mmol/L (ref 135–146)

## 2017-03-02 LAB — POCT URINALYSIS DIP (DEVICE)
Bilirubin Urine: NEGATIVE
GLUCOSE, UA: NEGATIVE mg/dL
HGB URINE DIPSTICK: NEGATIVE
KETONES UR: NEGATIVE mg/dL
Nitrite: NEGATIVE
Protein, ur: NEGATIVE mg/dL
SPECIFIC GRAVITY, URINE: 1.01 (ref 1.005–1.030)
Urobilinogen, UA: 0.2 mg/dL (ref 0.0–1.0)
pH: 6.5 (ref 5.0–8.0)

## 2017-03-02 MED ORDER — ONDANSETRON 4 MG PO TBDP: 4.0000 mg | ORAL_TABLET | Freq: Three times a day (TID) | ORAL | 0 refills | Status: DC | PRN

## 2017-03-02 MED ORDER — POTASSIUM CHLORIDE ER 10 MEQ PO TBCR: 10.0000 meq | EXTENDED_RELEASE_TABLET | Freq: Every day | ORAL | 0 refills | Status: DC

## 2017-03-02 MED ORDER — IOPAMIDOL (ISOVUE-300) INJECTION 61%
INTRAVENOUS | Status: AC
  Administered 2017-03-02: 30 mL via ORAL
  Filled 2017-03-02: qty 30

## 2017-03-02 MED ORDER — DICYCLOMINE HCL 20 MG PO TABS: 20.0000 mg | ORAL_TABLET | Freq: Two times a day (BID) | ORAL | 0 refills | Status: DC

## 2017-03-02 NOTE — Patient Instructions (Addendum)
I am reordering your potassium tablets. Please take 1 tablet daily for 5 days.  Bentyl 20 mg, 2 times daily for diarrhea.  For nausea, I have ordered ondansetron 4 mg take for nausea up to every 8 hours as needed.    Go to the radiology department and pick-up contrast to drink to prepare for your CT of abdomen which is an imaging study to detect any abnormalities that may be contributing to your abdominal pain.  Your CT is scheduled at 3 pm today at [facility] radiology. I will speak with you regarding your test results.  You will be contacted by gastroenterology to schedule your colonoscopy.   If symptoms worsen, return for care or report to the emergency department.
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
## 2017-03-03 ENCOUNTER — Encounter: Payer: Self-pay | Admitting: Gastroenterology

## 2017-03-03 ENCOUNTER — Encounter: Payer: Self-pay | Admitting: Family Medicine

## 2017-03-03 LAB — URINE CULTURE: ORGANISM ID, BACTERIA: NO GROWTH

## 2017-03-09 ENCOUNTER — Ambulatory Visit (INDEPENDENT_AMBULATORY_CARE_PROVIDER_SITE_OTHER): Payer: Medicaid Other | Admitting: Family Medicine

## 2017-03-09 ENCOUNTER — Encounter: Payer: Self-pay | Admitting: Family Medicine

## 2017-03-09 VITALS — BP 134/68 | HR 64 | Temp 98.8°F | Resp 16 | Ht 64.0 in | Wt 163.0 lb

## 2017-03-09 DIAGNOSIS — R1084 Generalized abdominal pain: Secondary | ICD-10-CM

## 2017-03-09 LAB — POCT URINALYSIS DIP (DEVICE)
Bilirubin Urine: NEGATIVE
GLUCOSE, UA: NEGATIVE mg/dL
Hgb urine dipstick: NEGATIVE
KETONES UR: NEGATIVE mg/dL
Nitrite: NEGATIVE
PROTEIN: NEGATIVE mg/dL
SPECIFIC GRAVITY, URINE: 1.015 (ref 1.005–1.030)
Urobilinogen, UA: 0.2 mg/dL (ref 0.0–1.0)
pH: 6.5 (ref 5.0–8.0)

## 2017-03-09 MED ORDER — OMEPRAZOLE 40 MG PO CPDR: 40.0000 mg | DELAYED_RELEASE_CAPSULE | Freq: Every day | ORAL | 3 refills | Status: DC

## 2017-03-09 NOTE — Patient Instructions (Signed)
I have ordered omeprazole 40 mg once daily for symptoms of abdominal burning.  We will discuss during your follow-up appointment.   Gastroesophageal Reflux Disease, Adult Normally, food travels down the esophagus and stays in the stomach to be digested. If a person has gastroesophageal reflux disease (GERD), food and stomach acid move back up into the esophagus. When this happens, the esophagus becomes sore and swollen (inflamed). Over time, GERD can make small holes (ulcers) in the lining of the esophagus. Follow these instructions at home: Diet   Follow a diet as told by your doctor. You may need to avoid foods and drinks such as:  Coffee and tea (with or without caffeine).  Drinks that contain alcohol.  Energy drinks and sports drinks.  Carbonated drinks or sodas.  Chocolate and cocoa.  Peppermint and mint flavorings.  Garlic and onions.  Horseradish.  Spicy and acidic foods, such as peppers, chili powder, curry powder, vinegar, hot sauces, and BBQ sauce.  Citrus fruit juices and citrus fruits, such as oranges, lemons, and limes.  Tomato-based foods, such as red sauce, chili, salsa, and pizza with red sauce.  Fried and fatty foods, such as donuts, french fries, potato chips, and high-fat dressings.  High-fat meats, such as hot dogs, rib eye steak, sausage, ham, and bacon.  High-fat dairy items, such as whole milk, butter, and cream cheese.  Eat small meals often. Avoid eating large meals.  Avoid drinking large amounts of liquid with your meals.  Avoid eating meals during the 2-3 hours before bedtime.  Avoid lying down right after you eat.  Do not exercise right after you eat. General instructions   Pay attention to any changes in your symptoms.  Take over-the-counter and prescription medicines only as told by your doctor. Do not take aspirin, ibuprofen, or other NSAIDs unless your doctor says it is okay.  Do not use any tobacco products, including cigarettes,  chewing tobacco, and e-cigarettes. If you need help quitting, ask your doctor.  Wear loose clothes. Do not wear anything tight around your waist.  Raise (elevate) the head of your bed about 6 inches (15 cm).  Try to lower your stress. If you need help doing this, ask your doctor.  If you are overweight, lose an amount of weight that is healthy for you. Ask your doctor about a safe weight loss goal.  Keep all follow-up visits as told by your doctor. This is important. Contact a doctor if:  You have new symptoms.  You lose weight and you do not know why it is happening.  You have trouble swallowing, or it hurts to swallow.  You have wheezing or a cough that keeps happening.  Your symptoms do not get better with treatment.  You have a hoarse voice. Get help right away if:  You have pain in your arms, neck, jaw, teeth, or back.  You feel sweaty, dizzy, or light-headed.  You have chest pain or shortness of breath.  You throw up (vomit) and your throw up looks like blood or coffee grounds.  You pass out (faint).  Your poop (stool) is bloody or black.  You cannot swallow, drink, or eat. This information is not intended to replace advice given to you by your health care provider. Make sure you discuss any questions you have with your health care provider. Document Released: 04/19/2008 Document Revised: 04/08/2016 Document Reviewed: 02/26/2015 Elsevier Interactive Patient Education  2017 [employer].

## 2017-03-09 NOTE — Progress Notes (Signed)
Patient ID: [name], female    DOB: [birth year], 61 y.o.   MRN: [id number]  PCP: Joaquin Courts, FNP  Chief Complaint  Patient presents with  . Follow-up    1 WEEK ON ABDOMINAL PAIN    Subjective:  HPI Felicia Collier is a 61 y.o. female presents for follow-up evaluation of abdominal pain. Felicia Collier was seen in office on 03/02/2017 with a complaint of 2 week history of abdominal pain with nausea and diarrhea stools that are bloody. A CT of abdomen was negative for any acute findings. Today she reports her abdominal pain feels like a "stinging" sensation. Sensation of burning in stomach bilateral mid-epigastric region. Continues to experience nausea. Zofran made nausea worst. She reports that she refuses to get the colonoscopy as she recalls having one in the past and they took her in room and she almost did not wake-up. Felicia Collier reports that this was done through another health system a few years back and no polyps were found. She reports stools over the last few days have been soft, non-watery, and she hasn't seen any blood.    Social History   Social History  . Marital status: Single    Spouse name: N/A  . Number of children: N/A  . Years of education: N/A   Occupational History  . Not on file.   Social History Main Topics  . Smoking status: Never Smoker  . Smokeless tobacco: Never Used  . Alcohol use No  . Drug use: No  . Sexual activity: Not on file   Other Topics Concern  . Not on file   Social History Narrative  . No narrative on file   History reviewed. No pertinent family history.  Review of Systems  See HPI   Patient Active Problem List   Diagnosis Date Noted  . Chronic pain of left knee 02/09/2017  . Unilateral primary osteoarthritis, left knee 02/09/2017  . Neuropathy 11/22/2016  . Gastroesophageal reflux disease without esophagitis 11/22/2016  . Weight gain 11/22/2016  . Atypical chest pain 11/04/2016    No Known Allergies  Prior to Admission  medications   Medication Sig Start Date End Date Taking? Authorizing Provider  acetaminophen (TYLENOL) 500 MG tablet Take 1,000 mg by mouth every 6 (six) hours as needed for moderate pain.   Yes Historical Provider, MD  diclofenac (VOLTAREN) 75 MG EC tablet Take 1 tablet (75 mg total) by mouth 2 (two) times daily. 01/21/17  Yes Dorena Bodo, NP  dicyclomine (BENTYL) 20 MG tablet Take 1 tablet (20 mg total) by mouth 2 (two) times daily. 03/02/17  Yes Doyle Askew, FNP  gabapentin (NEURONTIN) 300 MG capsule Take 1 capsule (300 mg total) by mouth 3 (three) times daily. 01/31/17  Yes Doyle Askew, FNP  methocarbamol (ROBAXIN) 500 MG tablet Take 1 tablet (500 mg total) by mouth 2 (two) times daily. 12/29/16  Yes Hannah Muthersbaugh, PA-C  ondansetron (ZOFRAN-ODT) 4 MG disintegrating tablet Take 1 tablet (4 mg total) by mouth every 8 (eight) hours as needed for nausea. 03/02/17  Yes Doyle Askew, FNP  potassium chloride (K-DUR) 10 MEQ tablet Take 1 tablet (10 mEq total) by mouth daily. 03/02/17  Yes Doyle Askew, FNP    Past Medical, Surgical Family and Social History reviewed and updated.    Objective:   Today's Vitals   03/09/17 0811  BP: 134/68  Pulse: 64  Resp: 16  Temp: 98.8 F (37.1 C)  TempSrc: Oral  SpO2: 98%  Weight: 163 lb (73.9 kg)  Height:  (1.626 m)   Wt Readings from Last 3 Encounters:  03/09/17 163 lb (73.9 kg)  03/02/17 163 lb (73.9 kg)  02/28/17 168 lb (76.2 kg)   Physical Exam  Constitutional: She is oriented to person, place, and time. She appears well-developed and well-nourished.  HENT:  Head: Normocephalic and atraumatic.  Eyes: Pupils are equal, round, and reactive to light.  Neck: Normal range of motion.  Cardiovascular: Normal rate, regular rhythm, normal heart sounds and intact distal pulses.   Pulmonary/Chest: Effort normal and breath sounds normal.  Abdominal: Soft. Bowel sounds are normal. She  exhibits no distension. There is no tenderness.  Musculoskeletal: Normal range of motion.  Neurological: She is alert and oriented to person, place, and time.  Skin: Skin is warm and dry.  Psychiatric: She has a normal mood and affect. Her behavior is normal. Judgment and thought content normal.       Assessment & Plan:  1. Generalized abdominal pain -Start omeprazole 40 mg daily for symptoms likely related to acid reflux.  RTC: July 2018  Godfrey Pick. Tiburcio Pea, MSN, [facility] [address] [place], [place] [address] [phone]

## 2017-03-31 ENCOUNTER — Emergency Department (HOSPITAL_COMMUNITY)
Admission: EM | Admit: 2017-03-31 | Discharge: 2017-03-31 | Disposition: A | Discharge status: Home / Self Care | Payer: Medicaid Other | Attending: Emergency Medicine | Admitting: Emergency Medicine

## 2017-03-31 ENCOUNTER — Ambulatory Visit: Payer: Medicaid Other | Admitting: Gastroenterology

## 2017-03-31 ENCOUNTER — Encounter (HOSPITAL_COMMUNITY): Payer: Self-pay | Admitting: Emergency Medicine

## 2017-03-31 DIAGNOSIS — M545 Low back pain: Secondary | ICD-10-CM | POA: Diagnosis not present

## 2017-03-31 DIAGNOSIS — Z79899 Other long term (current) drug therapy: Secondary | ICD-10-CM | POA: Insufficient documentation

## 2017-03-31 DIAGNOSIS — G8929 Other chronic pain: Secondary | ICD-10-CM | POA: Insufficient documentation

## 2017-03-31 LAB — URINALYSIS, ROUTINE W REFLEX MICROSCOPIC
Bilirubin Urine: NEGATIVE
Glucose, UA: NEGATIVE mg/dL
HGB URINE DIPSTICK: NEGATIVE
Ketones, ur: NEGATIVE mg/dL
Nitrite: NEGATIVE
PROTEIN: NEGATIVE mg/dL
SPECIFIC GRAVITY, URINE: 1.018 (ref 1.005–1.030)
pH: 6 (ref 5.0–8.0)

## 2017-03-31 MED ORDER — NAPROXEN 250 MG PO TABS: 250.0000 mg | ORAL_TABLET | Freq: Two times a day (BID) | ORAL | 0 refills | Status: DC

## 2017-03-31 NOTE — ED Triage Notes (Signed)
Patient reports chronic low back pain for several years worse this week , denies injury , no urinary discomfort or hematuria . No fever or chills .

## 2017-03-31 NOTE — ED Provider Notes (Signed)
[facility]EMER[facility] DEPT Provider Note   CSN: [id number] Arrival date & time: 03/31/17  [id number]     History   Chief Complaint Chief Complaint  Patient presents with  . Back Pain    HPI Felicia Collier is a 61 y.o. female.  Felicia Collier is a 61 y.o. Female with a history of chronic low back pain and schizophrenia who presents to the emergency department complaining of her chronic low back pain today. Patient reports she has ongoing bilateral low back pain that is worse over the past several weeks. She tells me she's been out of her pain medications to use for her back and this has worsened her back pain. She tells me she has pain across her bilateral low back that is worse with movements and worse with standing up from a seated position. She denies any falls or trauma to her back. She denies any changes to her back pain. She has taken nothing for treatment of her symptoms today. She does tell me that she uses aerobics and dancing to help with her back pain. She denies fevers, urinary symptoms, loss of bladder control, loss of bowel control, numbness, tingling, weakness, history of cancer, history of drug use, rapid changes to her weight, falls or rashes.   The history is provided by the patient and medical records. No language interpreter was used.  Back Pain   Pertinent negatives include no chest pain, no fever, no numbness, no headaches, no abdominal pain, no dysuria and no weakness.    Past Medical History:  Diagnosis Date  . Chronic knee pain   . Schizophrenia [facility])     Patient Active Problem List   Diagnosis Date Noted  . Chronic pain of left knee 02/09/2017  . Unilateral primary osteoarthritis, left knee 02/09/2017  . Neuropathy 11/22/2016  . Gastroesophageal reflux disease without esophagitis 11/22/2016  . Weight gain 11/22/2016  . Atypical chest pain 11/04/2016    Past Surgical History:  Procedure Laterality Date  . CHOLECYSTECTOMY      OB History    No data available         Home Medications    Prior to Admission medications   Medication Sig Start Date End Date Taking? Authorizing Provider  acetaminophen (TYLENOL) 500 MG tablet Take 1,000 mg by mouth every 6 (six) hours as needed for moderate pain.    [provider]  dicyclomine (BENTYL) 20 MG tablet Take 1 tablet (20 mg total) by mouth 2 (two) times daily. 03/02/17   Bing NeighborsHarris, Kimberly S, FNP  gabapentin (NEURONTIN) 300 MG capsule Take 1 capsule (300 mg total) by mouth 3 (three) times daily. 01/31/17   Bing NeighborsHarris, Kimberly S, FNP  methocarbamol (ROBAXIN) 500 MG tablet Take 1 tablet (500 mg total) by mouth 2 (two) times daily. 12/29/16   Muthersbaugh, Dahlia ClientHannah, PA-C  naproxen (NAPROSYN) 250 MG tablet Take 1 tablet (250 mg total) by mouth 2 (two) times daily with a meal. 03/31/17   Everlene Farrieransie, Shylyn Younce, PA-C  omeprazole (PRILOSEC) 40 MG capsule Take 1 capsule (40 mg total) by mouth daily. 03/09/17   Bing NeighborsHarris, Kimberly S, FNP  ondansetron (ZOFRAN-ODT) 4 MG disintegrating tablet Take 1 tablet (4 mg total) by mouth every 8 (eight) hours as needed for nausea. 03/02/17   Bing NeighborsHarris, Kimberly S, FNP  potassium chloride (K-DUR) 10 MEQ tablet Take 1 tablet (10 mEq total) by mouth daily. 03/02/17   Bing NeighborsHarris, Kimberly S, FNP    Family History No family history on file.  Social History Social History  Substance Use Topics  . Smoking status: Never Smoker  . Smokeless tobacco: Never Used  . Alcohol use No     Allergies   Patient has no known allergies.   Review of Systems Review of Systems  Constitutional: Negative for fever and unexpected weight change.  Respiratory: Negative for cough and shortness of breath.   Cardiovascular: Negative for chest pain.  Gastrointestinal: Negative for abdominal pain, diarrhea, nausea and vomiting.  Genitourinary: Negative for difficulty urinating, dysuria, flank pain, frequency and urgency.  Musculoskeletal: Positive for back pain. Negative for gait problem.  Skin: Negative for rash.   Neurological: Negative for weakness, numbness and headaches.     Physical Exam Updated Vital Signs BP (!) 160/98 (BP Location: Right Arm)   Pulse 73   Temp 98.4 F (36.9 C) (Oral)   Resp 18   Ht 5\' 4"  (1.626 m)   Wt 76.2 kg   SpO2 94%   BMI 28.84 kg/m   Physical Exam  Constitutional: She is oriented to person, place, and time. She appears well-developed and well-nourished. No distress.  Nontoxic appearing. Well groomed.  HENT:  Head: Normocephalic and atraumatic.  Eyes: Conjunctivae are normal. Pupils are equal, round, and reactive to light. Right eye exhibits no discharge. Left eye exhibits no discharge.  Neck: Neck supple.  Cardiovascular: Normal rate, regular rhythm, normal heart sounds and intact distal pulses.   Pulmonary/Chest: Effort normal and breath sounds normal. No respiratory distress. She has no wheezes. She has no rales.  Abdominal: Soft. There is no tenderness. There is no guarding.  Abdomen is soft and nontender palpation.  Musculoskeletal: Normal range of motion. She exhibits tenderness. She exhibits no edema or deformity.  Mild tenderness across her bilateral low back musculature. No midline neck or back tenderness. No back erythema, deformity or ecchymosis. She has good strength in her bilateral extremities.  Lymphadenopathy:    She has no cervical adenopathy.  Neurological: She is alert and oriented to person, place, and time. She displays normal reflexes. No sensory deficit. Coordination normal.  Sensation is intact to her bilateral lower extremities. Normal gait. Bilateral patellar DTRs are intact.  Skin: Skin is warm and dry. Capillary refill takes less than 2 seconds. No rash noted. She is not diaphoretic. No erythema. No pallor.  Psychiatric: She has a normal mood and affect. Her speech is normal and behavior is normal.  She does not appear to be responding to internal stimuli. Speech is clear and coherent.   Nursing note and vitals reviewed.    ED  Treatments / Results  Labs (all labs ordered are listed, but only abnormal results are displayed) Labs Reviewed  URINALYSIS, ROUTINE W REFLEX MICROSCOPIC - Abnormal; Notable for the following:       Result Value   Leukocytes, UA LARGE (*)    Bacteria, UA MANY (*)    Squamous Epithelial / LPF 0-5 (*)    All other components within normal limits  URINE CULTURE    EKG  EKG Interpretation None       Radiology No results found.  Procedures Procedures (including critical care time)  Medications Ordered in ED Medications - No data to display   Initial Impression / Assessment and Plan / ED Course  I have reviewed the triage vital signs and the nursing notes.  Pertinent labs & imaging results that were available during my care of the patient were reviewed by me and considered in my medical decision making (see chart for details).  This is a 61 y.o. Female with a history of chronic low back pain and schizophrenia who presents to the emergency department complaining of her chronic low back pain today. Patient reports she has ongoing bilateral low back pain that is worse over the past several weeks. She tells me she's been out of her pain medications to use for her back and this has worsened her back pain. She tells me she has pain across her bilateral low back that is worse with movements and worse with standing up from a seated position. She denies any falls or trauma to her back. She denies any changes to her back pain. She has taken nothing for treatment of her symptoms today. She denies urinary symptoms.  On exam the patient is afebrile nontoxic appearing. Patient with chronic low back pain.  No neurological deficits and normal neuro exam.  Patient can walk with normal gait.  No loss of bowel or bladder control.  No concern for cauda equina.  No fever, night sweats, weight loss, h/o cancer, IVDU. Urinalysis was sent by triage nurse. Patient without urinary symptoms. Does indicate  large leukocytes and many bacteria. It is nitrite negative. Patient denies urinary symptoms. We'll send for urine culture at this time. No treatment at this time.  RICE protocol, back exercises, and pain medicine indicated and discussed with patient.  I advised the patient to follow-up with their primary care provider this week. I advised the patient to return to the emergency department with new or worsening symptoms or new concerns. The patient verbalized understanding and agreement with plan.      Final Clinical Impressions(s) / ED Diagnoses   Final diagnoses:  Chronic bilateral low back pain without sciatica    New Prescriptions New Prescriptions   NAPROXEN (NAPROSYN) 250 MG TABLET    Take 1 tablet (250 mg total) by mouth 2 (two) times daily with a meal.     Everlene Farrier, PA-C 03/31/17 [id number]    Devoria Albe, MD 03/31/17 [phone]

## 2017-04-01 LAB — URINE CULTURE

## 2017-04-05 ENCOUNTER — Encounter (HOSPITAL_COMMUNITY): Payer: Self-pay

## 2017-04-05 ENCOUNTER — Emergency Department (HOSPITAL_COMMUNITY)
Admission: EM | Admit: 2017-04-05 | Discharge: 2017-04-05 | Disposition: A | Discharge status: Home / Self Care | Payer: Medicaid Other | Attending: Emergency Medicine | Admitting: Emergency Medicine

## 2017-04-05 DIAGNOSIS — Z79899 Other long term (current) drug therapy: Secondary | ICD-10-CM | POA: Diagnosis not present

## 2017-04-05 DIAGNOSIS — M545 Low back pain, unspecified: Secondary | ICD-10-CM

## 2017-04-05 DIAGNOSIS — G8929 Other chronic pain: Secondary | ICD-10-CM | POA: Insufficient documentation

## 2017-04-05 LAB — URINALYSIS, ROUTINE W REFLEX MICROSCOPIC
BILIRUBIN URINE: NEGATIVE
Glucose, UA: NEGATIVE mg/dL
HGB URINE DIPSTICK: NEGATIVE
KETONES UR: NEGATIVE mg/dL
NITRITE: NEGATIVE
PROTEIN: NEGATIVE mg/dL
SPECIFIC GRAVITY, URINE: 1.015 (ref 1.005–1.030)
pH: 6 (ref 5.0–8.0)

## 2017-04-05 MED ORDER — METHOCARBAMOL 500 MG PO TABS: 500.0000 mg | ORAL_TABLET | Freq: Two times a day (BID) | ORAL | 0 refills | Status: DC

## 2017-04-05 NOTE — ED Provider Notes (Signed)
[facility]PT Provider Note   CSN: [id number] Arrival date & time: 04/05/17  1328  By signing my name below, I, Sonum Patel, attest that this documentation has been prepared under the direction and in the presence of Laveta Gilkey, PA-C.Marland Kitchen Electronically Signed: Leone Payor, Scribe. 04/05/17. 2:30 PM.  History   Chief Complaint Chief Complaint  Patient presents with  . Back Pain    The history is provided by the patient. No language interpreter was used.     HPI Comments: Felicia Collier is a 61 y.o. female who presents to the Emergency Department complaining of chronic lower back pain today. She denies recent falls or injuries since she was last seen in the ED on 03/31/17. At that visit she was prescribed naproxen which she has taken without relief. She states the medication irritates her stomach and vaginal area. She states that this feels similar to her chronic back pain but she would like something else for her symptoms as a naproxen is not working for her. She denies numbness, weakness, saddle anesthesia, bowel/bladder incontinence, hematuria, nausea, vomiting, fever.   Past Medical History:  Diagnosis Date  . Chronic knee pain   . Schizophrenia [facility])     Patient Active Problem List   Diagnosis Date Noted  . Chronic pain of left knee 02/09/2017  . Unilateral primary osteoarthritis, left knee 02/09/2017  . Neuropathy 11/22/2016  . Gastroesophageal reflux disease without esophagitis 11/22/2016  . Weight gain 11/22/2016  . Atypical chest pain 11/04/2016    Past Surgical History:  Procedure Laterality Date  . CHOLECYSTECTOMY      OB History    No data available       Home Medications    Prior to Admission medications   Medication Sig Start Date End Date Taking? Authorizing Provider  acetaminophen (TYLENOL) 500 MG tablet Take 1,000 mg by mouth every 6 (six) hours as needed for moderate pain.    [provider]  dicyclomine (BENTYL) 20 MG tablet Take 1  tablet (20 mg total) by mouth 2 (two) times daily. 03/02/17   Bing Neighbors, FNP  gabapentin (NEURONTIN) 300 MG capsule Take 1 capsule (300 mg total) by mouth 3 (three) times daily. 01/31/17   Bing Neighbors, FNP  methocarbamol (ROBAXIN) 500 MG tablet Take 1 tablet (500 mg total) by mouth 2 (two) times daily. 04/05/17   Davontay Watlington, PA-C  naproxen (NAPROSYN) 250 MG tablet Take 1 tablet (250 mg total) by mouth 2 (two) times daily with a meal. 03/31/17   Everlene Farrier, PA-C  omeprazole (PRILOSEC) 40 MG capsule Take 1 capsule (40 mg total) by mouth daily. 03/09/17   Bing Neighbors, FNP  ondansetron (ZOFRAN-ODT) 4 MG disintegrating tablet Take 1 tablet (4 mg total) by mouth every 8 (eight) hours as needed for nausea. 03/02/17   Bing Neighbors, FNP  potassium chloride (K-DUR) 10 MEQ tablet Take 1 tablet (10 mEq total) by mouth daily. 03/02/17   Bing Neighbors, FNP    Family History History reviewed. No pertinent family history.  Social History Social History  Substance Use Topics  . Smoking status: Never Smoker  . Smokeless tobacco: Never Used  . Alcohol use No     Allergies   Patient has no known allergies.   Review of Systems Review of Systems  Constitutional: Negative for fever.  Gastrointestinal: Positive for abdominal pain. Negative for nausea and vomiting.  Genitourinary: Negative for hematuria.  Musculoskeletal: Positive for back pain.  Neurological: Negative for weakness  and numbness.     Physical Exam Updated Vital Signs BP 138/64 (BP Location: Right Arm)   Pulse 69   Temp 98.5 F (36.9 C) (Oral)   Resp 18   SpO2 100%   Physical Exam  Constitutional: She appears well-developed and well-nourished. No distress.  HENT:  Head: Normocephalic and atraumatic.  Eyes: Conjunctivae and EOM are normal. No scleral icterus.  Neck: Normal range of motion.  Cardiovascular: Normal rate and regular rhythm.   Pulmonary/Chest: Effort normal. No respiratory  distress.  Abdominal: Soft. Bowel sounds are normal. There is tenderness (suprapubic).  Musculoskeletal: She exhibits tenderness. She exhibits no deformity.  Tenderness to palpation to lower lumbar spine and paraspinal muscles. No bruising, no step off, good strength in BUE and BLE. No signs of numbness.   Neurological: She is alert.  Skin: No rash noted. She is not diaphoretic.  Psychiatric: She has a normal mood and affect.  Nursing note and vitals reviewed.    ED Treatments / Results  DIAGNOSTIC STUDIES: Oxygen Saturation is 100% on RA, normal by my interpretation.    COORDINATION OF CARE: 2:21 PM Discussed treatment plan with pt at bedside and pt agreed to plan.   Labs (all labs ordered are listed, but only abnormal results are displayed) Labs Reviewed  URINALYSIS, ROUTINE W REFLEX MICROSCOPIC - Abnormal; Notable for the following:       Result Value   Leukocytes, UA MODERATE (*)    Bacteria, UA RARE (*)    Squamous Epithelial / LPF 0-5 (*)    All other components within normal limits  URINE CULTURE    EKG  EKG Interpretation None       Radiology No results found.  Procedures Procedures (including critical care time)  Medications Ordered in ED Medications - No data to display   Initial Impression / Assessment and Plan / ED Course  I have reviewed the triage vital signs and the nursing notes.  Pertinent labs & imaging results that were available during my care of the patient were reviewed by me and considered in my medical decision making (see chart for details).     Patient presents with complaints of chronic back pain that is not controlled with naproxen. No numbness, weakness, gait changes, saddle anesthesia or urinary incontinence. No injury or falls since last visit here in the ED a few days ago. She also complains of some suprapubic abdominal pain and burning with urination, although she is unsure if this is caused by the naproxen which she states is  causing her to "burn down there." UA showed moderate leukocytes and rare bacteria but 0-5 squamous epithelial cells. We'll send this for culture. No need to treat at this time.  We'll give muscle relaxer for symptomatic relief. I advised her to continue Tylenol or anti-inflammatories as needed. No need for repeat imaging at this time. Return precautions given.  Final Clinical Impressions(s) / ED Diagnoses   Final diagnoses:  Chronic midline low back pain without sciatica    New Prescriptions Discharge Medication List as of 04/05/2017  3:40 PM     I personally performed the services described in this documentation, which was scribed in my presence. The recorded information has been reviewed and is accurate.    Dietrich PatesKhatri, Nikisha Fleece, PA-C 04/05/17 1742    Jacalyn LefevreHaviland, Julie, MD 04/05/17 [id number]

## 2017-04-05 NOTE — Discharge Instructions (Signed)
Take muscle relaxer twice daily as directed. Continue ibuprofen or Tylenol as needed. Use ice or heat as needed and affect.. Return to ED for worsening pain, trouble walking, numbness, weakness, loss of bladder function, additional injury

## 2017-04-05 NOTE — ED Triage Notes (Signed)
Pt reports lower back pain that she was seen here recently for same. She was given naproxen which didn't help and caused her to have vaginal burning. Pt ambulatory. Pt denies urinary difficulty or incontinence of bowel or bladder.

## 2017-04-06 ENCOUNTER — Ambulatory Visit: Payer: Medicaid Other | Admitting: Family Medicine

## 2017-04-07 LAB — URINE CULTURE

## 2017-04-10 ENCOUNTER — Emergency Department (HOSPITAL_COMMUNITY)
Admission: EM | Admit: 2017-04-10 | Discharge: 2017-04-10 | Disposition: A | Discharge status: Home / Self Care | Payer: Medicaid Other | Attending: Emergency Medicine | Admitting: Emergency Medicine

## 2017-04-10 ENCOUNTER — Encounter (HOSPITAL_COMMUNITY): Payer: Self-pay | Admitting: Emergency Medicine

## 2017-04-10 ENCOUNTER — Emergency Department (HOSPITAL_COMMUNITY): Payer: Medicaid Other

## 2017-04-10 DIAGNOSIS — G8929 Other chronic pain: Secondary | ICD-10-CM | POA: Diagnosis not present

## 2017-04-10 DIAGNOSIS — M549 Dorsalgia, unspecified: Secondary | ICD-10-CM | POA: Insufficient documentation

## 2017-04-10 DIAGNOSIS — Z79899 Other long term (current) drug therapy: Secondary | ICD-10-CM | POA: Diagnosis not present

## 2017-04-10 DIAGNOSIS — M79605 Pain in left leg: Secondary | ICD-10-CM | POA: Diagnosis not present

## 2017-04-10 MED ORDER — NAPROXEN 375 MG PO TABS: 375.0000 mg | ORAL_TABLET | Freq: Two times a day (BID) | ORAL | 0 refills | Status: DC

## 2017-04-10 NOTE — Discharge Instructions (Signed)
Follow-up with her primary care doctor next 24-48 hours for further evaluation.  Continue taking the Naprosyn and Robaxin as directed for symptomatic relief.  Apply heat to her leg to help with muscle relaxation.  Return the emergency Department for any worsening pain, fever, difficulty walking, urinary or bowel accidents, weakness of her arms or legs or any other worsening or concerning symptoms.
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
## 2017-04-12 ENCOUNTER — Emergency Department (HOSPITAL_COMMUNITY)
Admission: EM | Admit: 2017-04-12 | Discharge: 2017-04-12 | Disposition: A | Discharge status: Home / Self Care | Payer: Medicaid Other | Attending: Emergency Medicine | Admitting: Emergency Medicine

## 2017-04-12 ENCOUNTER — Encounter (HOSPITAL_COMMUNITY): Payer: Self-pay | Admitting: Emergency Medicine

## 2017-04-12 DIAGNOSIS — Z79899 Other long term (current) drug therapy: Secondary | ICD-10-CM | POA: Diagnosis not present

## 2017-04-12 DIAGNOSIS — L299 Pruritus, unspecified: Secondary | ICD-10-CM | POA: Diagnosis present

## 2017-04-12 MED ORDER — HYDROCORTISONE 1 % EX CREA: 1.0000 "application " | TOPICAL_CREAM | Freq: Two times a day (BID) | CUTANEOUS | 1 refills | Status: DC

## 2017-04-12 NOTE — ED Triage Notes (Signed)
Pt states she was bitten by "something" while sleeping. C/o itching to the neck.

## 2017-04-12 NOTE — Discharge Instructions (Signed)
Get help right away if: °You have joint pain. °You have a rash. °You have shortness of breath. °You feel unusually tired or sleepy. °You have neck pain. °You have a headache. °You have unusual weakness. °You have chest pain. °You have nausea, vomiting, or pain in the abdomen. °

## 2017-04-12 NOTE — ED Provider Notes (Signed)
[facility]ER[facility] DEPT Provider Note   CSN: [id number] Arrival date & time: 04/12/17  [id number]     History   Chief Complaint Chief Complaint  Patient presents with  . Insect Bite    HPI Felicia Collier is a 61 y.o. female  who presents with chief complaint of bug bite to her right shoulder. Patient states that she got bitten on the right shoulder and it is itching. She also got bitten on the left shoulder. She states "I have back pain secondary to get infected." She denies any fevers, chills or other complaints at this time.  HPI  Past Medical History:  Diagnosis Date  . Chronic knee pain   . Schizophrenia [facility](HCC)     Patient Active Problem List   Diagnosis Date Noted  . Chronic pain of left knee 02/09/2017  . Unilateral primary osteoarthritis, left knee 02/09/2017  . Neuropathy 11/22/2016  . Gastroesophageal reflux disease without esophagitis 11/22/2016  . Weight gain 11/22/2016  . Atypical chest pain 11/04/2016    Past Surgical History:  Procedure Laterality Date  . CHOLECYSTECTOMY      OB History    No data available       Home Medications    Prior to Admission medications   Medication Sig Start Date End Date Taking? Authorizing Provider  acetaminophen (TYLENOL) 500 MG tablet Take 1,000 mg by mouth every 6 (six) hours as needed for moderate pain.    [provider]  dicyclomine (BENTYL) 20 MG tablet Take 1 tablet (20 mg total) by mouth 2 (two) times daily. 03/02/17   Bing NeighborsHarris, Kimberly S, FNP  gabapentin (NEURONTIN) 300 MG capsule Take 1 capsule (300 mg total) by mouth 3 (three) times daily. 01/31/17   Bing NeighborsHarris, Kimberly S, FNP  hydrocortisone cream 1 % Apply 1 application topically 2 (two) times daily. Do not apply to face 04/12/17   Arthor CaptainHarris, Harvey Lingo, PA-C  methocarbamol (ROBAXIN) 500 MG tablet Take 1 tablet (500 mg total) by mouth 2 (two) times daily. 04/05/17   Khatri, Hina, PA-C  naproxen (NAPROSYN) 375 MG tablet Take 1 tablet (375 mg total) by mouth 2 (two) times  daily. 04/10/17   Maxwell CaulLayden, Lindsey A, PA-C  omeprazole (PRILOSEC) 40 MG capsule Take 1 capsule (40 mg total) by mouth daily. 03/09/17   Bing NeighborsHarris, Kimberly S, FNP  ondansetron (ZOFRAN-ODT) 4 MG disintegrating tablet Take 1 tablet (4 mg total) by mouth every 8 (eight) hours as needed for nausea. 03/02/17   Bing NeighborsHarris, Kimberly S, FNP  potassium chloride (K-DUR) 10 MEQ tablet Take 1 tablet (10 mEq total) by mouth daily. 03/02/17   Bing NeighborsHarris, Kimberly S, FNP    Family History No family history on file.  Social History Social History  Substance Use Topics  . Smoking status: Never Smoker  . Smokeless tobacco: Never Used  . Alcohol use No     Allergies   Patient has no known allergies.   Review of Systems Review of Systems  Constitutional: Negative for chills and fever.  Skin: Negative for wound.     Physical Exam Updated Vital Signs BP 132/62   Pulse (!) 51   Temp 98 F (36.7 C) (Oral)   Resp 16   Ht 5\' 4"  (1.626 m)   Wt 74.4 kg (164 lb)   SpO2 100%   BMI 28.15 kg/m   Physical Exam  Constitutional: She is oriented to person, place, and time. She appears well-developed and well-nourished. No distress.  HENT:  Head: Normocephalic and atraumatic.  Eyes: Conjunctivae are  normal. No scleral icterus.  Neck: Normal range of motion.  Cardiovascular: Normal rate, regular rhythm and normal heart sounds.  Exam reveals no gallop and no friction rub.   No murmur heard. Pulmonary/Chest: Effort normal and breath sounds normal. No respiratory distress.  Abdominal: Soft. Bowel sounds are normal. She exhibits no distension and no mass. There is no tenderness. There is no guarding.  Neurological: She is alert and oriented to person, place, and time.  Skin: Skin is warm and dry. She is not diaphoretic.  Normal appearing skin on both shoulders. No evidence of insect bite or other lesion  Psychiatric: Her behavior is normal.  Nursing note and vitals reviewed.    ED Treatments / Results   Labs (all labs ordered are listed, but only abnormal results are displayed) Labs Reviewed - No data to display  EKG  EKG Interpretation None       Radiology Dg Hip Unilat With Pelvis 2-3 Views Left  Result Date: 04/10/2017 CLINICAL DATA:  61 year old female with fall and left hip pain. EXAM: DG HIP (WITH OR WITHOUT PELVIS) 2-3V LEFT COMPARISON:  Abdominal CT dated 03/02/2017 FINDINGS: There is no acute fracture or dislocation. The bones are mildly osteopenic. There is mild degenerative changes of the lower lumbar spine. The soft tissues appear unremarkable. IMPRESSION: No acute fracture or dislocation. Electronically Signed   By: Elgie Collard M.D.   On: 04/10/2017 20:45    Procedures Procedures (including critical care time)  Medications Ordered in ED Medications - No data to display   Initial Impression / Assessment and Plan / ED Course  I have reviewed the triage vital signs and the nursing notes.  Pertinent labs & imaging results that were available during my care of the patient were reviewed by me and considered in my medical decision making (see chart for details).    Patient without evidence of infection or insect bites at all.  No rashes.  Suggest cortisone for itching. I doubt other causes such as xerosis, No icterus or signs of liver failure. Appears safe for discharge.  Final Clinical Impressions(s) / ED Diagnoses   Final diagnoses:  Itching    New Prescriptions New Prescriptions   HYDROCORTISONE CREAM 1 %    Apply 1 application topically 2 (two) times daily. Do not apply to face     Arthor Captain, PA-C 04/12/17 0739    Dione Booze, MD 04/12/17 2251

## 2017-04-13 ENCOUNTER — Ambulatory Visit (INDEPENDENT_AMBULATORY_CARE_PROVIDER_SITE_OTHER): Payer: Medicaid Other | Admitting: Family Medicine

## 2017-04-13 ENCOUNTER — Encounter: Payer: Self-pay | Admitting: Family Medicine

## 2017-04-13 VITALS — BP 151/58 | HR 57 | Temp 98.0°F | Resp 16 | Ht 64.0 in | Wt 160.6 lb

## 2017-04-13 DIAGNOSIS — M549 Dorsalgia, unspecified: Secondary | ICD-10-CM

## 2017-04-13 DIAGNOSIS — R1084 Generalized abdominal pain: Secondary | ICD-10-CM | POA: Diagnosis not present

## 2017-04-13 DIAGNOSIS — G8929 Other chronic pain: Secondary | ICD-10-CM | POA: Diagnosis not present

## 2017-04-13 DIAGNOSIS — F4541 Pain disorder exclusively related to psychological factors: Secondary | ICD-10-CM | POA: Diagnosis not present

## 2017-04-13 LAB — POCT URINALYSIS DIP (DEVICE)
Bilirubin Urine: NEGATIVE
GLUCOSE, UA: NEGATIVE mg/dL
HGB URINE DIPSTICK: NEGATIVE
Ketones, ur: NEGATIVE mg/dL
NITRITE: NEGATIVE
PROTEIN: NEGATIVE mg/dL
Specific Gravity, Urine: 1.01 (ref 1.005–1.030)
Urobilinogen, UA: 0.2 mg/dL (ref 0.0–1.0)
pH: 6 (ref 5.0–8.0)

## 2017-04-13 MED ORDER — METHOCARBAMOL 500 MG PO TABS: 500.0000 mg | ORAL_TABLET | Freq: Two times a day (BID) | ORAL | 0 refills | Status: DC

## 2017-04-13 MED ORDER — KETOROLAC TROMETHAMINE 30 MG/ML IJ SOLN
30.0000 mg | Freq: Once | INTRAMUSCULAR | Status: AC
  Administered 2017-04-13: 30 mg via INTRAMUSCULAR

## 2017-04-13 MED ORDER — GABAPENTIN 300 MG PO CAPS: 300.0000 mg | ORAL_CAPSULE | Freq: Three times a day (TID) | ORAL | 3 refills | Status: DC

## 2017-04-13 MED ORDER — DICYCLOMINE HCL 20 MG PO TABS: 20.0000 mg | ORAL_TABLET | Freq: Two times a day (BID) | ORAL | 1 refills | Status: DC

## 2017-04-13 MED ORDER — DICYCLOMINE HCL 20 MG PO TABS: 20.0000 mg | ORAL_TABLET | Freq: Two times a day (BID) | ORAL | 0 refills | Status: DC

## 2017-04-13 NOTE — Patient Instructions (Addendum)
Back and chronic pain Resume- Gabapentin 300 mg 3 times daily for back pain. -Robaxin 500 mg 2 times daily   For abdominal symptoms: Resume dicyclomime 20 mg twice daily.   Remember emergency department is for serious emergencies only. For pain, colds, abdominal aches (severe pain go to the E), etc; Return here to the clinic or go to the Urgent.   [facility]  [phone] Please call and  Schedule a follow-up appointment for management of your schizophrenia symptoms     Irritable Bowel Syndrome, Adult Irritable bowel syndrome (IBS) is not one specific disease. It is a group of symptoms that affects the organs responsible for digestion (gastrointestinal or GI tract). To regulate how your GI tract works, your body sends signals back and forth between your intestines and your brain. If you have IBS, there may be a problem with these signals. As a result, your GI tract does not function normally. Your intestines may become more sensitive and overreact to certain things. This is especially true when you eat certain foods or when you are under stress. There are four types of IBS. These may be determined based on the consistency of your stool:  IBS with diarrhea.  IBS with constipation.  Mixed IBS.  Unsubtyped IBS. It is important to know which type of IBS you have. Some treatments are more likely to be helpful for certain types of IBS. What are the causes? The exact cause of IBS is not known. What increases the risk? You may have a higher risk of IBS if:  You are a woman.  You are younger than 60 years old.  You have a family history of IBS.  You have mental health problems.  You have had bacterial infection of your GI tract. What are the signs or symptoms? Symptoms of IBS vary from person to person. The main symptom is abdominal pain or discomfort. Additional symptoms usually include one or more of the following:  Diarrhea, constipation, or  both.  Abdominal swelling or bloating.  Feeling full or sick after eating a small or regular-size meal.  Frequent gas.  Mucus in the stool.  A feeling of having more stool left after a bowel movement. Symptoms tend to come and go. They may be associated with stress, psychiatric conditions, or nothing at all. How is this diagnosed? There is no specific test to diagnose IBS. Your health care provider will make a diagnosis based on a physical exam, medical history, and your symptoms. You may have other tests to rule out other conditions that may be causing your symptoms. These may include:  Blood tests.  X-rays.  CT scan.  Endoscopy and colonoscopy. This is a test in which your GI tract is viewed with a long, thin, flexible tube. How is this treated? There is no cure for IBS, but treatment can help relieve symptoms. IBS treatment often includes:  Changes to your diet, such as:  Eating more fiber.  Avoiding foods that cause symptoms.  Drinking more water.  Eating regular, medium-sized portioned meals.  Medicines. These may include:  Fiber supplements if you have constipation.  Medicine to control diarrhea (antidiarrheal medicines).  Medicine to help control muscle spasms in your GI tract (antispasmodic medicines).  Medicines to help with any mental health issues, such as antidepressants or tranquilizers.  Therapy.  Talk therapy may help with anxiety, depression, or other mental health issues that can make IBS symptoms worse.  Stress reduction.  Managing your stress can help keep  symptoms under control. Follow these instructions at home:  Take medicines only as directed by your health care provider.  Eat a healthy diet.  Avoid foods and drinks with added sugar.  Include more whole grains, fruits, and vegetables gradually into your diet. This may be especially helpful if you have IBS with constipation.  Avoid any foods and drinks that make your symptoms worse.  These may include dairy products and caffeinated or carbonated drinks.  Do not eat large meals.  Drink enough fluid to keep your urine clear or pale yellow.  Exercise regularly. Ask your health care provider for recommendations of good activities for you.  Keep all follow-up visits as directed by your health care provider. This is important. Contact a health care provider if:  You have constant pain.  You have trouble or pain with swallowing.  You have worsening diarrhea. Get help right away if:  You have severe and worsening abdominal pain.  You have diarrhea and:  You have a rash, stiff neck, or severe headache.  You are irritable, sleepy, or difficult to awaken.  You are weak, dizzy, or extremely thirsty.  You have bright red blood in your stool or you have black tarry stools.  You have unusual abdominal swelling that is painful.  You vomit continuously.  You vomit blood (hematemesis).  You have both abdominal pain and a fever. This information is not intended to replace advice given to you by your health care provider. Make sure you discuss any questions you have with your health care provider. Document Released: 11/01/2005 Document Revised: 04/02/2016 Document Reviewed: 07/19/2014 Elsevier Interactive Patient Education  2017 [employer] Inc.  Back Pain, Adult Back pain is very common. The pain often gets better over time. The cause of back pain is usually not dangerous. Most people can learn to manage their back pain on their own. Follow these instructions at home: Watch your back pain for any changes. The following actions may help to lessen any pain you are feeling:  Stay active. Start with short walks on flat ground if you can. Try to walk farther each day.  Exercise regularly as told by your doctor. Exercise helps your back heal faster. It also helps avoid future injury by keeping your muscles strong and flexible.  Do not sit, drive, or stand in one place for  more than 30 minutes.  Do not stay in bed. Resting more than 1-2 days can slow down your recovery.  Be careful when you bend or lift an object. Use good form when lifting:  Bend at your knees.  Keep the object close to your body.  Do not twist.  Sleep on a firm mattress. Lie on your side, and bend your knees. If you lie on your back, put a pillow under your knees.  Take medicines only as told by your doctor.  Put ice on the injured area.  Put ice in a plastic bag.  Place a towel between your skin and the bag.  Leave the ice on for 20 minutes, 2-3 times a day for the first 2-3 days. After that, you can switch between ice and heat packs.  Avoid feeling anxious or stressed. Find good ways to deal with stress, such as exercise.  Maintain a healthy weight. Extra weight puts stress on your back. Contact a doctor if:  You have pain that does not go away with rest or medicine.  You have worsening pain that goes down into your legs or buttocks.  You have  pain that does not get better in one week.  You have pain at night.  You lose weight.  You have a fever or chills. Get help right away if:  You cannot control when you poop (bowel movement) or pee (urinate).  Your arms or legs feel weak.  Your arms or legs lose feeling (numbness).  You feel sick to your stomach (nauseous) or throw up (vomit).  You have belly (abdominal) pain.  You feel like you may pass out (faint). This information is not intended to replace advice given to you by your health care provider. Make sure you discuss any questions you have with your health care provider. Document Released: 04/19/2008 Document Revised: 04/08/2016 Document Reviewed: 03/05/2014 Elsevier Interactive Patient Education  2017 [employer]

## 2017-04-13 NOTE — Progress Notes (Addendum)
Patient ID: Felicia Collier, female    DOB: [birth year], 61 y.o.   MRN: [id number]  PCP: Bing Neighbors, FNP   Subjective:  HPI  Felicia Collier is a 61 y.o. female presents for evaluation of   Medical history includes: Paranoid Schizophrenia, Chronic Pain, Neuropathy, GERD  Felicia Collier has had 16  ED visits within a 6 month timeframe for various acute non-emergent issues. Within the last three weeks, she has presented to the ED, 5 times for complaints stemming from  chronic abdominal pain, MSK complaints, and insect bite. Felicia Collier has been counseled on prior occasions  Regarding use of ED vs.PCP vs. Urgent Care. She reports today that she was unaware of that she can  go to [facility] for acute complaints for evaluation after clinic hours. She also reports today that she has missed several appointments at [facility] for treatment of her  schizophrenia with paranoia and depression features. She is at present not taking medications for her symptoms. Today, she presents complaining of abdominal pain and generalized MSK pain. She admits that she has  never taken previous medications prescribed, dicyclomine for symptoms consistent with IBS nor is she presently taking prescribed Gabapentin for chronic pain.   Abdominal pain Generalized aching, no diarrhea, afebrile, daily bowel movements, no N&V.  MSK Pain  Back pain, non-relieved with rest or movement. Bilateral knee pain, constantly hurting. Aching pain. Denies back or knee injury. She has not attempted pain relief with OTC medication.   Social History   Social History  . Marital status: Single    Spouse name: N/A  . Number of children: N/A  . Years of education: N/A   Occupational History  . Not on file.   Social History Main Topics  . Smoking status: Never Smoker  . Smokeless tobacco: Never Used  . Alcohol use No  . Drug use: No  . Sexual activity: Not on file   Other Topics Concern  . Not on file   Social History  Narrative  . No narrative on file    No family history on file. Review of Systems  See HPI  Patient Active Problem List   Diagnosis Date Noted  . Chronic pain of left knee 02/09/2017  . Unilateral primary osteoarthritis, left knee 02/09/2017  . Neuropathy 11/22/2016  . Gastroesophageal reflux disease without esophagitis 11/22/2016  . Weight gain 11/22/2016  . Atypical chest pain 11/04/2016    No Known Allergies  Prior to Admission medications   Medication Sig Start Date End Date Taking? Authorizing Provider  acetaminophen (TYLENOL) 500 MG tablet Take 1,000 mg by mouth every 6 (six) hours as needed for moderate pain.    [provider]  dicyclomine (BENTYL) 20 MG tablet Take 1 tablet (20 mg total) by mouth 2 (two) times daily. 03/02/17   Bing Neighbors, FNP  gabapentin (NEURONTIN) 300 MG capsule Take 1 capsule (300 mg total) by mouth 3 (three) times daily. 01/31/17   Bing Neighbors, FNP  hydrocortisone cream 1 % Apply 1 application topically 2 (two) times daily. Do not apply to face 04/12/17   Arthor Captain, PA-C  methocarbamol (ROBAXIN) 500 MG tablet Take 1 tablet (500 mg total) by mouth 2 (two) times daily. 04/05/17   Khatri, Hina, PA-C  naproxen (NAPROSYN) 375 MG tablet Take 1 tablet (375 mg total) by mouth 2 (two) times daily. 04/10/17   Maxwell Caul, PA-C  omeprazole (PRILOSEC) 40 MG capsule Take 1 capsule (40 mg total) by mouth daily. 03/09/17  Bing NeighborsHarris, Eneida Evers S, FNP  ondansetron (ZOFRAN-ODT) 4 MG disintegrating tablet Take 1 tablet (4 mg total) by mouth every 8 (eight) hours as needed for nausea. 03/02/17   Bing NeighborsHarris, Kaylee Wombles S, FNP  potassium chloride (K-DUR) 10 MEQ tablet Take 1 tablet (10 mEq total) by mouth daily. 03/02/17   Bing NeighborsHarris, Lion Fernandez S, FNP    Past Medical, Surgical Family and Social History reviewed and updated.    Objective:  There were no vitals filed for this visit.  Wt Readings from Last 3 Encounters:  04/12/17 164 lb (74.4 kg)  03/31/17  168 lb (76.2 kg)  03/09/17 163 lb (73.9 kg)    Physical Exam  Constitutional: She is oriented to person, place, and time. She appears well-developed and well-nourished.  HENT:  Head: Normocephalic and atraumatic.  Eyes: Conjunctivae and EOM are normal. Pupils are equal, round, and reactive to light.  Neck: Normal range of motion.  Cardiovascular: Normal rate, regular rhythm and normal heart sounds.   Pulmonary/Chest: Effort normal and breath sounds normal.  Abdominal: Soft. Bowel sounds are normal. She exhibits no distension and no mass. There is no tenderness. There is no rebound and no guarding.  Musculoskeletal:  Subjective tenderness -non reproducible with palpation of knees and spinous process   Neurological: She is alert and oriented to person, place, and time. She has normal reflexes.  Skin: Skin is warm and dry.  Psychiatric: She has a normal mood and affect. Her speech is normal and behavior is normal. Thought content is paranoid and delusional.    Assessment & Plan:  1. Psychosomatic pain -Patient counseled extensively regarding appropriate use of emergency department, primary care office, and  urgent care. Patient verbalized understanding that she will utilize the ED for life-threatening emergencies. She verbalized understanding that she will utilize [facility] for after hours care. -Patient agreed to [facility] to schedule a follow-up appointment for evaluation of schizophrenia.   2. Generalized abdominal pain Resume taking dicyclomine (BENTYL) 20 MG tablet; Take 1 tablet (20 mg total) by mouth 2 (two) times daily.   3. Other chronic back pain - ketorolac (TORADOL) 30 MG/ML injection 30 mg; Inject 1 mL (30 mg total) into the muscle once. -Resume Gabapentin 300 mg, 3 times daily.   Diagnostic tests ordered during this encounter: - CBC with Differential - Lipase - Amylase - Urine culture  RTC: 2 weeks for follow-up   Godfrey PickKimberly S.  Tiburcio PeaHarris, MSN, FNP-C [facility]  [address] [place], [place] [address] [phone]

## 2017-04-14 ENCOUNTER — Other Ambulatory Visit (HOSPITAL_COMMUNITY)
Admission: RE | Admit: 2017-04-14 | Discharge: 2017-04-14 | Disposition: A | Discharge status: Home / Self Care | Payer: Medicaid Other | Source: Ambulatory Visit | Attending: Family Medicine | Admitting: Family Medicine

## 2017-04-14 ENCOUNTER — Ambulatory Visit: Payer: Medicaid Other | Admitting: Family Medicine

## 2017-04-14 DIAGNOSIS — N898 Other specified noninflammatory disorders of vagina: Secondary | ICD-10-CM

## 2017-04-14 DIAGNOSIS — Z719 Counseling, unspecified: Secondary | ICD-10-CM

## 2017-04-14 LAB — CBC WITH DIFFERENTIAL/PLATELET
Basophils Absolute: 0 cells/uL (ref 0–200)
Basophils Relative: 0 %
EOS ABS: 78 {cells}/uL (ref 15–500)
Eosinophils Relative: 1 %
HEMATOCRIT: 40.4 % (ref 35.0–45.0)
HEMOGLOBIN: 12.5 g/dL (ref 11.7–15.5)
LYMPHS ABS: 1794 {cells}/uL (ref 850–3900)
LYMPHS PCT: 23 %
MCH: 25.7 pg — ABNORMAL LOW (ref 27.0–33.0)
MCHC: 30.9 g/dL — ABNORMAL LOW (ref 32.0–36.0)
MCV: 83.1 fL (ref 80.0–100.0)
MONO ABS: 468 {cells}/uL (ref 200–950)
MPV: 10.9 fL (ref 7.5–12.5)
Monocytes Relative: 6 %
Neutro Abs: 5460 cells/uL (ref 1500–7800)
Neutrophils Relative %: 70 %
Platelets: 299 10*3/uL (ref 140–400)
RBC: 4.86 MIL/uL (ref 3.80–5.10)
RDW: 13.7 % (ref 11.0–15.0)
WBC: 7.8 10*3/uL (ref 3.8–10.8)

## 2017-04-14 LAB — URINE CULTURE

## 2017-04-14 LAB — AMYLASE: Amylase: 43 U/L (ref 21–101)

## 2017-04-14 LAB — LIPASE: Lipase: 23 U/L (ref 7–60)

## 2017-04-14 NOTE — Progress Notes (Signed)
Walk in for vaginal discharge. Self swab

## 2017-04-15 LAB — CERVICOVAGINAL ANCILLARY ONLY
Bacterial vaginitis: NEGATIVE
Candida vaginitis: NEGATIVE
Chlamydia: NEGATIVE
NEISSERIA GONORRHEA: NEGATIVE
Trichomonas: NEGATIVE

## 2017-04-20 ENCOUNTER — Emergency Department (HOSPITAL_COMMUNITY): Payer: Medicaid Other

## 2017-04-20 ENCOUNTER — Emergency Department (HOSPITAL_COMMUNITY)
Admission: EM | Admit: 2017-04-20 | Discharge: 2017-04-21 | Disposition: A | Discharge status: Home / Self Care | Payer: Medicaid Other | Attending: Emergency Medicine | Admitting: Emergency Medicine

## 2017-04-20 ENCOUNTER — Encounter (HOSPITAL_COMMUNITY): Payer: Self-pay | Admitting: Emergency Medicine

## 2017-04-20 DIAGNOSIS — M79606 Pain in leg, unspecified: Secondary | ICD-10-CM | POA: Diagnosis present

## 2017-04-20 DIAGNOSIS — Z5321 Procedure and treatment not carried out due to patient leaving prior to being seen by health care provider: Secondary | ICD-10-CM | POA: Insufficient documentation

## 2017-04-20 DIAGNOSIS — M25559 Pain in unspecified hip: Secondary | ICD-10-CM | POA: Insufficient documentation

## 2017-04-20 NOTE — ED Notes (Signed)
Pt came to desk and is still here, pt previously called to go back to a room and did not respond.

## 2017-04-20 NOTE — ED Notes (Signed)
Pt called in waiting room to go back to a room multiples times and no response.

## 2017-04-21 ENCOUNTER — Ambulatory Visit (HOSPITAL_COMMUNITY)
Admission: EM | Admit: 2017-04-21 | Discharge: 2017-04-21 | Disposition: A | Discharge status: Home / Self Care | Payer: Medicaid Other | Attending: Internal Medicine | Admitting: Internal Medicine

## 2017-04-21 ENCOUNTER — Encounter (HOSPITAL_COMMUNITY): Payer: Self-pay | Admitting: Emergency Medicine

## 2017-04-21 DIAGNOSIS — M5442 Lumbago with sciatica, left side: Secondary | ICD-10-CM

## 2017-04-21 MED ORDER — METHYLPREDNISOLONE SODIUM SUCC 125 MG IJ SOLR
INTRAMUSCULAR | Status: AC
  Filled 2017-04-21: qty 2

## 2017-04-21 MED ORDER — METHYLPREDNISOLONE SODIUM SUCC 125 MG IJ SOLR
80.0000 mg | Freq: Once | INTRAMUSCULAR | Status: AC
  Administered 2017-04-21: 80 mg via INTRAMUSCULAR

## 2017-04-21 NOTE — Discharge Instructions (Addendum)
X-rays done in the emergency room last evening did not show any new bony injury/fracture. Left low back and leg pain seems most consistent with flare up of back pain and sciatica. Injection of Solu-Medrol, steroid, was given today to try to help with this. Try ice for 5-10 minutes several times daily to the painful areas. Therapy is also sometimes helpful. Follow-up with your primary care provider or orthopedist to discuss further options, as planned.

## 2017-04-21 NOTE — ED Provider Notes (Signed)
[facility]    CSN: [id number] Arrival date & time: 04/21/17  1333     History   Chief Complaint Chief Complaint  Patient presents with  . Leg Pain    HPI Felicia Collier is a 61 y.o. female. She has a lot of difficulty with low back and knee pain, and is seeing Dr. Magnus IvanBlackman for left knee pain and left wrist pain. She had a Toradol injection in her left upper buttock with her PCP on May 30, and has had difficulty with increased left low back pain radiating into the left lateral thigh, since. She went to the emergency room last evening, and the wait was kind of long, left after she had a hip and pelvis x-ray.  She was able to walk into the urgent care independently, climb on/off the exam table. No assistive device is visible in the room. She does move slowly, like it is painful.    HPI  Past Medical History:  Diagnosis Date  . Chronic knee pain   . Schizophrenia [facility])     Patient Active Problem List   Diagnosis Date Noted  . Chronic pain of left knee 02/09/2017  . Unilateral primary osteoarthritis, left knee 02/09/2017  . Neuropathy 11/22/2016  . Gastroesophageal reflux disease without esophagitis 11/22/2016  . Weight gain 11/22/2016  . Atypical chest pain 11/04/2016    Past Surgical History:  Procedure Laterality Date  . CHOLECYSTECTOMY       Home Medications    Prior to Admission medications   Medication Sig Start Date End Date Taking? Authorizing Provider  acetaminophen (TYLENOL) 500 MG tablet Take 1,000 mg by mouth every 6 (six) hours as needed for moderate pain.    [provider]  dicyclomine (BENTYL) 20 MG tablet Take 1 tablet (20 mg total) by mouth 2 (two) times daily. 04/13/17   Bing NeighborsHarris, Kimberly S, FNP  dicyclomine (BENTYL) 20 MG tablet Take 1 tablet (20 mg total) by mouth 2 (two) times daily. 04/13/17   Bing NeighborsHarris, Kimberly S, FNP  gabapentin (NEURONTIN) 300 MG capsule Take 1 capsule (300 mg total) by mouth 3 (three) times daily. 04/13/17    Bing NeighborsHarris, Kimberly S, FNP  hydrocortisone cream 1 % Apply 1 application topically 2 (two) times daily. Do not apply to face 04/12/17   Arthor CaptainHarris, Abigail, PA-C  methocarbamol (ROBAXIN) 500 MG tablet Take 1 tablet (500 mg total) by mouth 2 (two) times daily. 04/13/17   Bing NeighborsHarris, Kimberly S, FNP  naproxen (NAPROSYN) 375 MG tablet Take 1 tablet (375 mg total) by mouth 2 (two) times daily. 04/10/17   Maxwell CaulLayden, Lindsey A, PA-C  omeprazole (PRILOSEC) 40 MG capsule Take 1 capsule (40 mg total) by mouth daily. 03/09/17   Bing NeighborsHarris, Kimberly S, FNP  ondansetron (ZOFRAN-ODT) 4 MG disintegrating tablet Take 1 tablet (4 mg total) by mouth every 8 (eight) hours as needed for nausea. 03/02/17   Bing NeighborsHarris, Kimberly S, FNP  potassium chloride (K-DUR) 10 MEQ tablet Take 1 tablet (10 mEq total) by mouth daily. 03/02/17   Bing NeighborsHarris, Kimberly S, FNP    Family History No family history on file.  Social History Social History  Substance Use Topics  . Smoking status: Never Smoker  . Smokeless tobacco: Never Used  . Alcohol use No     Allergies   Patient has no known allergies.   Review of Systems Review of Systems  All other systems reviewed and are negative.    Physical Exam Triage Vital Signs ED Triage Vitals  Enc Vitals  Group     BP 04/21/17 1401 137/77     Pulse Rate 04/21/17 1401 62     Resp 04/21/17 1401 18     Temp 04/21/17 1401 98.1 F (36.7 C)     Temp Source 04/21/17 1401 Oral     SpO2 04/21/17 1401 97 %     Weight --      Height --      Pain Score 04/21/17 1359 10     Pain Loc --    Updated Vital Signs BP 137/77 (BP Location: Left Arm)   Pulse 62   Temp 98.1 F (36.7 C) (Oral)   Resp 18   SpO2 97%   Physical Exam  Constitutional: She is oriented to person, place, and time. No distress.  HENT:  Head: Atraumatic.  Eyes:  Conjugate gaze observed, no eye redness/discharge  Neck: Neck supple.  Cardiovascular: Normal rate.   Pulmonary/Chest: No respiratory distress.  Abdominal: She exhibits no  distension.  Musculoskeletal: Normal range of motion.  No focal tenderness to palpation over the low back, mild spasm palpable in the bilateral paralumbar areas. No focal redness/warmth/bruising/swelling.. Left upper buttock is inspected and there is no focal nodule or swelling, no erythema, to suggest an injection reaction or abscess.  Neurological: She is alert and oriented to person, place, and time.  Skin: Skin is warm and dry.  Nursing note and vitals reviewed.    UC Treatments / Results   Radiology No results found.  Procedures Procedures (including critical care time)  Medications Ordered in UC Medications  methylPREDNISolone sodium succinate (SOLU-MEDROL) 125 mg/2 mL injection 80 mg (80 mg Intramuscular Given 04/21/17 1516)     Final Clinical Impressions(s) / UC Diagnoses   Final diagnoses:  Acute left-sided low back pain with left-sided sciatica   X-rays done in the emergency room last evening did not show any new bony injury/fracture. Left low back and leg pain seems most consistent with flare up of back pain and sciatica. Injection of Solu-Medrol, steroid, was given today to try to help with this. Try ice for 5-10 minutes several times daily to the painful areas. Therapy is also sometimes helpful. Follow-up with your primary care provider or orthopedist to discuss further options, as planned.    Eustace Moore, MD 04/23/17 1012
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
## 2017-04-26 ENCOUNTER — Encounter (HOSPITAL_COMMUNITY): Payer: Self-pay | Admitting: *Deleted

## 2017-04-26 ENCOUNTER — Emergency Department (HOSPITAL_COMMUNITY)
Admission: EM | Admit: 2017-04-26 | Discharge: 2017-04-27 | Disposition: A | Discharge status: Home / Self Care | Payer: Medicaid Other | Attending: Emergency Medicine | Admitting: Emergency Medicine

## 2017-04-26 DIAGNOSIS — F209 Schizophrenia, unspecified: Secondary | ICD-10-CM | POA: Insufficient documentation

## 2017-04-26 DIAGNOSIS — M1611 Unilateral primary osteoarthritis, right hip: Secondary | ICD-10-CM

## 2017-04-26 DIAGNOSIS — M25551 Pain in right hip: Secondary | ICD-10-CM | POA: Insufficient documentation

## 2017-04-26 DIAGNOSIS — Z79899 Other long term (current) drug therapy: Secondary | ICD-10-CM | POA: Diagnosis not present

## 2017-04-26 NOTE — ED Provider Notes (Signed)
[facility] DEPT Provider Note   CSN: [id number] Arrival date & time: 04/26/17  2232     History   Chief Complaint Chief Complaint  Patient presents with  . Hip Pain    HPI Felicia Collier is a 61 y.o. female.  This is a 62 year old female with a history of osteoarthritis.  She has been seen in this emergency department, as well as her primary care physician recently started on gabapentin.  Bentyl and Robaxin and given a crutch to assist with ambulation.  She states that the pain is.  Not any better and now she is complaining of pain at the base of her neck.  She states it does have an appointment with orthopedics on June 28. Denies any falls, numbness or tingling to the right leg, swelling in the legs      Past Medical History:  Diagnosis Date  . Chronic knee pain   . Schizophrenia [facility])     Patient Active Problem List   Diagnosis Date Noted  . Chronic pain of left knee 02/09/2017  . Unilateral primary osteoarthritis, left knee 02/09/2017  . Neuropathy 11/22/2016  . Gastroesophageal reflux disease without esophagitis 11/22/2016  . Weight gain 11/22/2016  . Atypical chest pain 11/04/2016    Past Surgical History:  Procedure Laterality Date  . CHOLECYSTECTOMY      OB History    No data available       Home Medications    Prior to Admission medications   Medication Sig Start Date End Date Taking? Authorizing Provider  acetaminophen (TYLENOL) 500 MG tablet Take 1,000 mg by mouth every 6 (six) hours as needed for moderate pain.    [provider]  dicyclomine (BENTYL) 20 MG tablet Take 1 tablet (20 mg total) by mouth 2 (two) times daily. 04/13/17   Bing Neighbors, FNP  dicyclomine (BENTYL) 20 MG tablet Take 1 tablet (20 mg total) by mouth 2 (two) times daily. 04/13/17   Bing Neighbors, FNP  gabapentin (NEURONTIN) 300 MG capsule Take 1 capsule (300 mg total) by mouth 3 (three) times daily. 04/13/17   Bing Neighbors, FNP  hydrocortisone cream 1  % Apply 1 application topically 2 (two) times daily. Do not apply to face 04/12/17   Arthor Captain, PA-C  methocarbamol (ROBAXIN) 500 MG tablet Take 1 tablet (500 mg total) by mouth 2 (two) times daily. 04/13/17   Bing Neighbors, FNP  naproxen (NAPROSYN) 375 MG tablet Take 1 tablet (375 mg total) by mouth 2 (two) times daily. 04/10/17   Maxwell Caul, PA-C  omeprazole (PRILOSEC) 40 MG capsule Take 1 capsule (40 mg total) by mouth daily. 03/09/17   Bing Neighbors, FNP  ondansetron (ZOFRAN-ODT) 4 MG disintegrating tablet Take 1 tablet (4 mg total) by mouth every 8 (eight) hours as needed for nausea. 03/02/17   Bing Neighbors, FNP  potassium chloride (K-DUR) 10 MEQ tablet Take 1 tablet (10 mEq total) by mouth daily. 03/02/17   Bing Neighbors, FNP    Family History No family history on file.  Social History Social History  Substance Use Topics  . Smoking status: Never Smoker  . Smokeless tobacco: Never Used  . Alcohol use No     Allergies   Patient has no known allergies.   Review of Systems Review of Systems  Constitutional: Negative for fever.  Respiratory: Negative for shortness of breath.   Cardiovascular: Negative for leg swelling.  Musculoskeletal: Positive for arthralgias and neck pain. Negative for  back pain.  Neurological: Negative for weakness and numbness.  All other systems reviewed and are negative.    Physical Exam Updated Vital Signs BP 117/69   Pulse 64   Temp 98.4 F (36.9 C) (Oral)   Resp 16   SpO2 97%   Physical Exam  Constitutional: She appears well-developed and well-nourished. No distress.  HENT:  Head: Normocephalic.  Eyes: Pupils are equal, round, and reactive to light.  Neck: Normal range of motion.  Cardiovascular: Normal rate.   Pulmonary/Chest: Effort normal.  Musculoskeletal: Normal range of motion. She exhibits no tenderness.  Neurological: She is alert.  Skin: Skin is warm.  Psychiatric: She has a normal mood and  affect.  Nursing note and vitals reviewed.    ED Treatments / Results  Labs (all labs ordered are listed, but only abnormal results are displayed) Labs Reviewed - No data to display  EKG  EKG Interpretation None       Radiology No results found.  Procedures Procedures (including critical care time)  Medications Ordered in ED Medications - No data to display   Initial Impression / Assessment and Plan / ED Course  I have reviewed the triage vital signs and the nursing notes.  Pertinent labs & imaging results that were available during my care of the patient were reviewed by me and considered in my medical decision making (see chart for details).      Patient has been encouraged to continue taking her medication.  She been instructed that she can add Tylenol, ibuprofen, along with her medicines for additional pain relief.  She's been encouraged to keep your appointment with the orthopedic doctor as scheduled on June 28  Final Clinical Impressions(s) / ED Diagnoses   Final diagnoses:  Right hip pain  Osteoarthritis of right hip, unspecified osteoarthritis type    New Prescriptions New Prescriptions   No medications on file     Earley FavorSchulz, Nashaun Hillmer, NP 04/27/17 0002    Dione BoozeGlick, David, MD 04/27/17 [phone]

## 2017-04-26 NOTE — Discharge Instructions (Signed)
As previously discussed, you have arthritis in your hip.  You have been given the correct medication by your physician.  Please continue to take this.  Keep your point with orthopedics on June 28 as scheduled.  You can safely take Tylenol or ibuprofen along with your other medications.

## 2017-04-26 NOTE — ED Triage Notes (Signed)
Pt c/o L hip, arm and leg pain for over a month. Reports falling in January, denies any recent injury. Pt ambulatory with steady gait and use of a crutch

## 2017-04-27 ENCOUNTER — Ambulatory Visit (INDEPENDENT_AMBULATORY_CARE_PROVIDER_SITE_OTHER): Payer: Medicaid Other | Admitting: Family Medicine

## 2017-04-27 ENCOUNTER — Encounter: Payer: Self-pay | Admitting: Family Medicine

## 2017-04-27 VITALS — BP 136/66 | HR 66 | Temp 98.6°F | Resp 16 | Ht 64.0 in | Wt 159.0 lb

## 2017-04-27 DIAGNOSIS — G894 Chronic pain syndrome: Secondary | ICD-10-CM | POA: Diagnosis not present

## 2017-04-27 DIAGNOSIS — F203 Undifferentiated schizophrenia: Secondary | ICD-10-CM | POA: Diagnosis not present

## 2017-04-27 DIAGNOSIS — R6889 Other general symptoms and signs: Secondary | ICD-10-CM | POA: Diagnosis not present

## 2017-04-27 LAB — POCT URINALYSIS DIP (DEVICE)
Bilirubin Urine: NEGATIVE
Glucose, UA: NEGATIVE mg/dL
HGB URINE DIPSTICK: NEGATIVE
Ketones, ur: NEGATIVE mg/dL
NITRITE: NEGATIVE
Protein, ur: NEGATIVE mg/dL
Specific Gravity, Urine: 1.01 (ref 1.005–1.030)
UROBILINOGEN UA: 0.2 mg/dL (ref 0.0–1.0)
pH: 7 (ref 5.0–8.0)

## 2017-04-27 NOTE — Patient Instructions (Signed)

## 2017-04-27 NOTE — Progress Notes (Signed)
Patient ID: Felicia Collier, female    DOB: 06-Mar-1956, 61 y.o.   MRN: [id number]  PCP: Bing Neighbors, FNP  Chief Complaint  Patient presents with  . Follow-up    2 WEEKS    Subjective:  HPI Felicia Collier is a 61 y.o. female presents for evaluation of complains of back pain. Felicia Collier is chronically presenting to emergency department for multiple MSK related complaints. Today Felicia Collier presents for a two week follow-up from her previous visit here in clinic. During her last visit, Felicia Collier was Encouraged to take previously prescribed medication for management for back and hip pain. Today Felicia Collier brought in her prescription bottles and reports compliance with taking medications. Felicia Collier was referred to orthopedic surgery for  further evaluation of source of chronic hip pain. Today Felicia Collier reports left knee, left hip, left hand and bilateral low back pain. Hip pain is sharp and burning with radiation up into her lower sacrum. Reports only minimal relief of symptoms with medications. Of note Felicia Collier reports that Felicia Collier has followed-up with [facility] behavioral health services and refused treatment with IM Haldol Which is indicated for treatment of her schizophrenia. Felicia Collier is scheduled to meet with psychiatry 06/27/17 at [facility] behavioral [facility].  Social History   Social History  . Marital status: Single    Spouse name: N/A  . Number of children: N/A  . Years of education: N/A   Occupational History  . Not on file.   Social History Main Topics  . Smoking status: Never Smoker  . Smokeless tobacco: Never Used  . Alcohol use No  . Drug use: No  . Sexual activity: Not on file   Other Topics Concern  . Not on file   Social History Narrative  . No narrative on file   Review of Systems See HPI  Patient Active Problem List   Diagnosis Date Noted  . Chronic pain of left knee 02/09/2017  . Unilateral primary osteoarthritis, left knee 02/09/2017  . Neuropathy 11/22/2016  . Gastroesophageal reflux disease without  esophagitis 11/22/2016  . Weight gain 11/22/2016  . Atypical chest pain 11/04/2016    No Known Allergies  Prior to Admission medications   Medication Sig Start Date End Date Taking? Authorizing Provider  acetaminophen (TYLENOL) 500 MG tablet Take 1,000 mg by mouth every 6 (six) hours as needed for moderate pain.   Yes [provider]  dicyclomine (BENTYL) 20 MG tablet Take 1 tablet (20 mg total) by mouth 2 (two) times daily. 04/13/17  Yes Bing Neighbors, FNP  gabapentin (NEURONTIN) 300 MG capsule Take 1 capsule (300 mg total) by mouth 3 (three) times daily. 04/13/17  Yes Bing Neighbors, FNP  methocarbamol (ROBAXIN) 500 MG tablet Take 1 tablet (500 mg total) by mouth 2 (two) times daily. 04/13/17  Yes Bing Neighbors, FNP  naproxen (NAPROSYN) 375 MG tablet Take 1 tablet (375 mg total) by mouth 2 (two) times daily. 04/10/17  Yes Maxwell Caul, PA-C  omeprazole (PRILOSEC) 40 MG capsule Take 1 capsule (40 mg total) by mouth daily. 03/09/17  Yes Bing Neighbors, FNP  ondansetron (ZOFRAN-ODT) 4 MG disintegrating tablet Take 1 tablet (4 mg total) by mouth every 8 (eight) hours as needed for nausea. 03/02/17  Yes Bing Neighbors, FNP  potassium chloride (K-DUR) 10 MEQ tablet Take 1 tablet (10 mEq total) by mouth daily. 03/02/17  Yes Bing Neighbors, FNP  dicyclomine (BENTYL) 20 MG tablet Take 1 tablet (20 mg total) by mouth 2 (two)  times daily. Patient not taking: Reported on 04/27/2017 04/13/17   Bing NeighborsHarris, Anetra Czerwinski S, FNP  hydrocortisone cream 1 % Apply 1 application topically 2 (two) times daily. Do not apply to face Patient not taking: Reported on 04/27/2017 04/12/17   Arthor CaptainHarris, Abigail, PA-C    Past Medical, Surgical Family and Social History reviewed and updated.    Objective:   Today's Vitals   04/27/17 0929  BP: 136/66  Pulse: 66  Resp: 16  Temp: 98.6 F (37 C)  TempSrc: Oral  SpO2: 100%  Weight: 159 lb (72.1 kg)  Height: 5\' 4"  (1.626 m)    Wt Readings from  Last 3 Encounters:  04/27/17 159 lb (72.1 kg)  04/20/17 161 lb (73 kg)  04/13/17 160 lb 9.6 oz (72.8 kg)    Physical Exam  Constitutional: Felicia Collier is oriented to person, place, and time. Felicia Collier appears well-developed and well-nourished.  HENT:  Head: Normocephalic and atraumatic.  Eyes: Conjunctivae and EOM are normal. Pupils are equal, round, and reactive to light.  Neck: Normal range of motion.  Cardiovascular: Normal rate, regular rhythm, normal heart sounds and intact distal pulses.   Pulmonary/Chest: Effort normal and breath sounds normal.  Musculoskeletal: Normal range of motion.  Neurological: Felicia Collier is alert and oriented to person, place, and time.  Skin: Skin is warm and dry.  Psychiatric: Felicia Collier has a normal mood and affect. Her speech is normal and behavior is normal. Thought content is paranoid. Felicia Collier expresses no suicidal plans and no homicidal plans.    Assessment & Plan:  1. Chronic pain syndrome 2. Somatic complaints, multiple 3. Undifferentiated schizophrenia (HCC) -Continue all medications as prescribed -Continue follow-up with [facility] behavioral [facility] -Remember to utilize our office for your acute complaints of musculoskeletal pain. Reserved use of the emergency department for true emergent issues. If after hours go to an urgent care as opposed to using the emergency department. -Social work referral placed to [facility] in order to intervene to help with mental health resources and to assess needs in order for patient to reduce her frequent use of the emergency department.   RTC:  1 month routine follow-up  Godfrey PickKimberly S. Tiburcio PeaHarris, MSN, FNP-C [facility]  [address] [place], [place] [address] [phone]

## 2017-04-29 ENCOUNTER — Emergency Department (HOSPITAL_COMMUNITY)
Admission: EM | Admit: 2017-04-29 | Discharge: 2017-04-29 | Disposition: A | Discharge status: Home / Self Care | Payer: Medicaid Other | Attending: Emergency Medicine | Admitting: Emergency Medicine

## 2017-04-29 ENCOUNTER — Encounter (HOSPITAL_COMMUNITY): Payer: Self-pay | Admitting: Emergency Medicine

## 2017-04-29 ENCOUNTER — Other Ambulatory Visit: Payer: Self-pay

## 2017-04-29 DIAGNOSIS — M79601 Pain in right arm: Secondary | ICD-10-CM | POA: Diagnosis present

## 2017-04-29 DIAGNOSIS — Z7902 Long term (current) use of antithrombotics/antiplatelets: Secondary | ICD-10-CM | POA: Insufficient documentation

## 2017-04-29 DIAGNOSIS — Z79899 Other long term (current) drug therapy: Secondary | ICD-10-CM | POA: Insufficient documentation

## 2017-04-29 MED ORDER — ACETAMINOPHEN 500 MG PO TABS
1000.0000 mg | ORAL_TABLET | Freq: Once | ORAL | Status: AC
  Administered 2017-04-29: 1000 mg via ORAL
  Filled 2017-04-29: qty 2

## 2017-04-29 NOTE — ED Provider Notes (Signed)
[facility]PT Provider Note   CSN: [id number] Arrival date & time: 04/29/17  0401     History   Chief Complaint Chief Complaint  Patient presents with  . Arm Pain    HPI Rowland LatheSheila Runco is a 61 y.o. female.  61 yo F with a chief complaint of right hand pain. This going on for the past week. Patient denies any injury nothing seems to make it better or worse. She feels that the pain is like an ache and radiates up to her right elbow.   The history is provided by the patient.  Arm Pain  This is a new problem. The current episode started more than 2 days ago. The problem occurs constantly. The problem has not changed since onset.Pertinent negatives include no chest pain, no headaches and no shortness of breath. Nothing aggravates the symptoms. Nothing relieves the symptoms. She has tried nothing for the symptoms. The treatment provided no relief.    Past Medical History:  Diagnosis Date  . Chronic knee pain   . Schizophrenia [facility](HCC)     Patient Active Problem List   Diagnosis Date Noted  . Chronic pain of left knee 02/09/2017  . Unilateral primary osteoarthritis, left knee 02/09/2017  . Neuropathy 11/22/2016  . Gastroesophageal reflux disease without esophagitis 11/22/2016  . Weight gain 11/22/2016  . Atypical chest pain 11/04/2016    Past Surgical History:  Procedure Laterality Date  . CHOLECYSTECTOMY      OB History    No data available       Home Medications    Prior to Admission medications   Medication Sig Start Date End Date Taking? Authorizing Provider  dicyclomine (BENTYL) 20 MG tablet Take 1 tablet (20 mg total) by mouth 2 (two) times daily. 04/13/17  Yes Bing NeighborsHarris, Kimberly S, FNP  gabapentin (NEURONTIN) 300 MG capsule Take 1 capsule (300 mg total) by mouth 3 (three) times daily. 04/13/17  Yes Bing NeighborsHarris, Kimberly S, FNP  methocarbamol (ROBAXIN) 500 MG tablet Take 1 tablet (500 mg total) by mouth 2 (two) times daily. 04/13/17  Yes Bing NeighborsHarris, Kimberly S, FNP    dicyclomine (BENTYL) 20 MG tablet Take 1 tablet (20 mg total) by mouth 2 (two) times daily. Patient not taking: Reported on 04/27/2017 04/13/17   Bing NeighborsHarris, Kimberly S, FNP  hydrocortisone cream 1 % Apply 1 application topically 2 (two) times daily. Do not apply to face Patient not taking: Reported on 04/27/2017 04/12/17   Arthor CaptainHarris, Abigail, PA-C  naproxen (NAPROSYN) 375 MG tablet Take 1 tablet (375 mg total) by mouth 2 (two) times daily. Patient not taking: Reported on 04/29/2017 04/10/17   Maxwell CaulLayden, Lindsey A, PA-C  omeprazole (PRILOSEC) 40 MG capsule Take 1 capsule (40 mg total) by mouth daily. Patient not taking: Reported on 04/29/2017 03/09/17   Bing NeighborsHarris, Kimberly S, FNP  ondansetron (ZOFRAN-ODT) 4 MG disintegrating tablet Take 1 tablet (4 mg total) by mouth every 8 (eight) hours as needed for nausea. Patient not taking: Reported on 04/29/2017 03/02/17   Bing NeighborsHarris, Kimberly S, FNP  potassium chloride (K-DUR) 10 MEQ tablet Take 1 tablet (10 mEq total) by mouth daily. Patient not taking: Reported on 04/29/2017 03/02/17   Bing NeighborsHarris, Kimberly S, FNP    Family History No family history on file.  Social History Social History  Substance Use Topics  . Smoking status: Never Smoker  . Smokeless tobacco: Never Used  . Alcohol use No     Allergies   Patient has no known allergies.   Review of Systems  Review of Systems  Constitutional: Negative for chills and fever.  HENT: Negative for congestion and rhinorrhea.   Eyes: Negative for redness and visual disturbance.  Respiratory: Negative for shortness of breath and wheezing.   Cardiovascular: Negative for chest pain and palpitations.  Gastrointestinal: Negative for nausea and vomiting.  Genitourinary: Negative for dysuria and urgency.  Musculoskeletal: Positive for arthralgias and myalgias.  Skin: Negative for pallor and wound.  Neurological: Negative for dizziness and headaches.     Physical Exam Updated Vital Signs BP 127/66 (BP Location: Right Arm)    Pulse (!) 59   Temp 97 F (36.1 C) (Oral)   Resp 16   Ht 5\' 4"  (1.626 m)   Wt 72.1 kg (159 lb)   SpO2 95%   BMI 27.29 kg/m   Physical Exam  Constitutional: She is oriented to person, place, and time. She appears well-developed and well-nourished. No distress.  HENT:  Head: Normocephalic and atraumatic.  Eyes: EOM are normal. Pupils are equal, round, and reactive to light.  Neck: Normal range of motion. Neck supple.  Cardiovascular: Normal rate and regular rhythm.  Exam reveals no gallop and no friction rub.   No murmur heard. Pulmonary/Chest: Effort normal. She has no wheezes. She has no rales.  Abdominal: Soft. She exhibits no distension and no mass. There is no tenderness. There is no guarding.  Musculoskeletal: She exhibits no edema or tenderness.  Pulse motor and sensation is intact to the right hand. Negative Finkelstein's test. Negative Tinel's.  Neurological: She is alert and oriented to person, place, and time.  Skin: Skin is warm and dry. She is not diaphoretic.  Psychiatric: She has a normal mood and affect. Her behavior is normal.  Nursing note and vitals reviewed.    ED Treatments / Results  Labs (all labs ordered are listed, but only abnormal results are displayed) Labs Reviewed - No data to display  EKG  EKG Interpretation None       Radiology No results found.  Procedures Procedures (including critical care time)  Medications Ordered in ED Medications  acetaminophen (TYLENOL) tablet 1,000 mg (not administered)     Initial Impression / Assessment and Plan / ED Course  I have reviewed the triage vital signs and the nursing notes.  Pertinent labs & imaging results that were available during my care of the patient were reviewed by me and considered in my medical decision making (see chart for details).     61 yo F With right hand pain. I'm unable to reproduce this on exam. I'm unsure of the etiology of the patient's pain. We'll place a  removable splint. Tylenol for pain. P follow-up.  5:01 AM:  I have discussed the diagnosis/risks/treatment options with the patient and family and believe the pt to be eligible for discharge home to follow-up with PCP. We also discussed returning to the ED immediately if new or worsening sx occur. We discussed the sx which are most concerning (e.g., sudden worsening pain, fever, inability to tolerate by mouth) that necessitate immediate return. Medications administered to the patient during their visit and any new prescriptions provided to the patient are listed below.  Medications given during this visit Medications  acetaminophen (TYLENOL) tablet 1,000 mg (not administered)     The patient appears reasonably screen and/or stabilized for discharge and I doubt any other medical condition or other [facility] requiring further screening, evaluation, or treatment in the ED at this time prior to discharge.    Final Clinical Impressions(s) /  ED Diagnoses   Final diagnoses:  Right arm pain    New Prescriptions New Prescriptions   No medications on file     Melene Plan, DO 04/29/17 0502

## 2017-04-29 NOTE — ED Triage Notes (Signed)
Pt c/o 10/10 constant right arm pain for a week getting worse today, denies any injury.

## 2017-04-29 NOTE — Discharge Instructions (Signed)
Take tylenol 1000mg(2 extra strength) four times a day.  ° °

## 2017-05-01 DIAGNOSIS — F209 Schizophrenia, unspecified: Secondary | ICD-10-CM | POA: Insufficient documentation

## 2017-05-12 ENCOUNTER — Ambulatory Visit (INDEPENDENT_AMBULATORY_CARE_PROVIDER_SITE_OTHER): Payer: Medicaid Other | Admitting: Orthopaedic Surgery

## 2017-05-14 ENCOUNTER — Encounter (HOSPITAL_COMMUNITY): Payer: Self-pay | Admitting: Emergency Medicine

## 2017-05-14 ENCOUNTER — Emergency Department (HOSPITAL_COMMUNITY)
Admission: EM | Admit: 2017-05-14 | Discharge: 2017-05-14 | Disposition: A | Discharge status: Home / Self Care | Payer: Medicaid Other | Attending: Emergency Medicine | Admitting: Emergency Medicine

## 2017-05-14 DIAGNOSIS — M7918 Myalgia, other site: Secondary | ICD-10-CM

## 2017-05-14 DIAGNOSIS — M791 Myalgia: Secondary | ICD-10-CM | POA: Insufficient documentation

## 2017-05-14 DIAGNOSIS — M542 Cervicalgia: Secondary | ICD-10-CM | POA: Insufficient documentation

## 2017-05-14 DIAGNOSIS — Z79899 Other long term (current) drug therapy: Secondary | ICD-10-CM | POA: Insufficient documentation

## 2017-05-14 NOTE — ED Provider Notes (Signed)
[facility] DEPT Provider Note   CSN: [id number] Arrival date & time: 05/14/17  1116     History   Chief Complaint Chief Complaint  Patient presents with  . Neck Pain    HPI Felicia Collier is a 61 y.o. female with history of chronic pain, schizophrenia, who presents today for evaluation of midline neck pain.   She has been previously diagnosed with somatic pain. She reports that her pain has been present for the past few months however upon brief chart review does not appear that she has mentioned this midline pain at any point during her 21 ED visits in the past 6 months, most of which are for chronic back pain.  She is concerned "it" meaning her pain is moving up her back and is now in her neck.  Patient denies trauma, no falls or headache.  She denies interventions PTA.     HPI  Past Medical History:  Diagnosis Date  . Chronic knee pain   . Schizophrenia [facility])     Patient Active Problem List   Diagnosis Date Noted  . Schizophrenia (HCC) 05/01/2017  . Chronic pain of left knee 02/09/2017  . Unilateral primary osteoarthritis, left knee 02/09/2017  . Neuropathy 11/22/2016  . Gastroesophageal reflux disease without esophagitis 11/22/2016  . Weight gain 11/22/2016  . Atypical chest pain 11/04/2016    Past Surgical History:  Procedure Laterality Date  . CHOLECYSTECTOMY      OB History    No data available       Home Medications    Prior to Admission medications   Medication Sig Start Date End Date Taking? Authorizing Provider  dicyclomine (BENTYL) 20 MG tablet Take 1 tablet (20 mg total) by mouth 2 (two) times daily. Patient not taking: Reported on 04/27/2017 04/13/17   Bing Neighbors, FNP  dicyclomine (BENTYL) 20 MG tablet Take 1 tablet (20 mg total) by mouth 2 (two) times daily. 04/13/17   Bing Neighbors, FNP  gabapentin (NEURONTIN) 300 MG capsule Take 1 capsule (300 mg total) by mouth 3 (three) times daily. 04/13/17   Bing Neighbors, FNP    hydrocortisone cream 1 % Apply 1 application topically 2 (two) times daily. Do not apply to face Patient not taking: Reported on 04/27/2017 04/12/17   Arthor Captain, PA-C  methocarbamol (ROBAXIN) 500 MG tablet Take 1 tablet (500 mg total) by mouth 2 (two) times daily. 04/13/17   Bing Neighbors, FNP  naproxen (NAPROSYN) 375 MG tablet Take 1 tablet (375 mg total) by mouth 2 (two) times daily. Patient not taking: Reported on 04/29/2017 04/10/17   Maxwell Caul, PA-C  omeprazole (PRILOSEC) 40 MG capsule Take 1 capsule (40 mg total) by mouth daily. Patient not taking: Reported on 04/29/2017 03/09/17   Bing Neighbors, FNP  ondansetron (ZOFRAN-ODT) 4 MG disintegrating tablet Take 1 tablet (4 mg total) by mouth every 8 (eight) hours as needed for nausea. Patient not taking: Reported on 04/29/2017 03/02/17   Bing Neighbors, FNP  potassium chloride (K-DUR) 10 MEQ tablet Take 1 tablet (10 mEq total) by mouth daily. Patient not taking: Reported on 04/29/2017 03/02/17   Bing Neighbors, FNP    Family History History reviewed. No pertinent family history.  Social History Social History  Substance Use Topics  . Smoking status: Never Smoker  . Smokeless tobacco: Never Used  . Alcohol use No     Allergies   Patient has no known allergies.   Review of Systems Review of  Systems  Constitutional: Negative for chills, fatigue and fever.  Musculoskeletal: Positive for arthralgias, back pain and neck pain. Negative for myalgias and neck stiffness.     Physical Exam Updated Vital Signs BP 123/79 (BP Location: Right Arm)   Pulse 64   Temp 98 F (36.7 C) (Oral)   Resp 14   Ht 5\' 4"  (1.626 m)   Wt 72.1 kg (159 lb)   SpO2 100%   BMI 27.29 kg/m   Physical Exam  Constitutional: She appears well-developed and well-nourished.  HENT:  Head: Normocephalic and atraumatic.  Eyes: Conjunctivae are normal. No scleral icterus.  Neck: Normal range of motion. Neck supple. No JVD present. No  spinous process tenderness and no muscular tenderness present. No neck rigidity. No tracheal deviation and normal range of motion present. No thyromegaly present.  Midline neck is reported as being painful, however no tenderness to palpation noted with exam of both midline and lateral neck.  Pulmonary/Chest: Effort normal. No stridor. No respiratory distress.  Musculoskeletal:  Patient slouching forward, poor posture. Patient is able to sit up straight when asked.   Lymphadenopathy:    She has no cervical adenopathy.  Neurological: She is alert. No sensory deficit. She exhibits normal muscle tone. Coordination normal.  Skin: Skin is warm and dry. She is not diaphoretic.  Nursing note and vitals reviewed.    ED Treatments / Results  Labs (all labs ordered are listed, but only abnormal results are displayed) Labs Reviewed - No data to display  EKG  EKG Interpretation None       Radiology No results found.  Procedures Procedures (including critical care time)  Medications Ordered in ED Medications - No data to display   Initial Impression / Assessment and Plan / ED Course  I have reviewed the triage vital signs and the nursing notes.  Pertinent labs & imaging results that were available during my care of the patient were reviewed by me and considered in my medical decision making (see chart for details).    Felicia LatheSheila Wilmot presents with midline neck pain with out trauma.  Unable to elicit neck pain on physical exam, neck is unremarkable. Based on patient's history, lack of trauma, and normal neck physical exam I suspect that patients reported pain is caused from patients poor posture, somatic pain, and possibly arthritis.  I do not feel imaging is indicated at this time based on patients lack of trauma and normal physical exam, lack of neuro/vascular deficits, and no headache.  Patient was counseled on supportive care measures, and instructed to follow-up with her PCP.  Patient was  given the option to ask questions, all of which were answered to the best of my abilities. Patient given return precautions and states her understanding. Patient hemodynamically stable, agreeable for discharge at this time.   Final Clinical Impressions(s) / ED Diagnoses   Final diagnoses:  Neck pain  Musculoskeletal pain    New Prescriptions Discharge Medication List as of 05/14/2017  1:53 PM       Cristina GongHammond, Namine Beahm W, PA-C 05/14/17 2118    Derwood KaplanNanavati, Ankit, MD 05/15/17 [phone]

## 2017-05-14 NOTE — ED Notes (Signed)
Bed: [facility] Expected date:  Expected time:  Means of arrival:  Comments: 

## 2017-05-14 NOTE — Discharge Instructions (Signed)
Please take Ibuprofen (Advil, motrin) and Tylenol (acetaminophen) to relieve your pain.  You may take up to 800 MG (4 pills) of normal strength ibuprofen every 8 hours as needed.  In between doses of ibuprofen you make take tylenol, up to 1,000 mg (two extra strength pills).  Do not take more than 3,000 mg tylenol in a 24 hour period.  Please check all medication labels as many medications such as pain and cold medications may contain tylenol.  Do not drink alcohol while taking these medications.  Do not take other NSAID'S while taking ibuprofen (such as aleve or naproxen).  Please take ibuprofen with food to decrease stomach upset. ° ° °

## 2017-05-14 NOTE — ED Triage Notes (Signed)
Pt reports midline neck pain for the past few months that is gradually getting worse. No acute injury. Moving neck in triage.

## 2017-05-17 ENCOUNTER — Ambulatory Visit (INDEPENDENT_AMBULATORY_CARE_PROVIDER_SITE_OTHER): Payer: Medicaid Other | Admitting: Orthopaedic Surgery

## 2017-05-17 ENCOUNTER — Encounter (INDEPENDENT_AMBULATORY_CARE_PROVIDER_SITE_OTHER): Payer: Self-pay | Admitting: Orthopaedic Surgery

## 2017-05-17 VITALS — Ht 64.0 in | Wt 159.0 lb

## 2017-05-17 DIAGNOSIS — M25562 Pain in left knee: Secondary | ICD-10-CM

## 2017-05-17 DIAGNOSIS — G8929 Other chronic pain: Secondary | ICD-10-CM | POA: Diagnosis not present

## 2017-05-17 DIAGNOSIS — M1712 Unilateral primary osteoarthritis, left knee: Secondary | ICD-10-CM | POA: Diagnosis not present

## 2017-05-17 MED ORDER — METHYLPREDNISOLONE 4 MG PO TABS: ORAL_TABLET | ORAL | 0 refills | Status: DC

## 2017-05-17 NOTE — Progress Notes (Signed)
The patient is someone I've seen once before due to osteoarthritis of her left knee. Unfortunate she is someone is been to the ER multiple times monthly for at least the first 6 months of this year and even longer when I reviewed notes in Epic. She comes today with chief complaint of left knee pain but also all over body pain. She is on chronic Neurontin 3 times a day as well as naproxen. She is not a diabetic. She is well-documented arthritis in her left knee. I talked about trying another injection today as well. We long the third discussion about her continuing to seek treatment from the emergency room. Unfortunately do much I have to offer due to her current health and psychologic status. It would not be prudent to perform a knee replacement with her current situation because of the long follow-up and support the someone needs for going to surgery such as this. She also does not want this type of surgery right now she does not even want an injection. Examination of her left knee shows no effusion or redness. She has full range of motion of that knee and is ligamentously stable. It does have medial joint line tenderness.  I can put both hips through full range of motion without difficulty. She has full range motion of her cervical and lumbar spine. She has normal wrist exam other than pain bilaterally.  , Loss would also like to provide. She'll follow up with her primary care physician otherwise she should try to avoid emergency room as well. Always try a six-day steroid taper. All questions were encouraged and answered.

## 2017-05-23 ENCOUNTER — Ambulatory Visit: Payer: Medicaid Other | Admitting: Family Medicine

## 2017-05-25 ENCOUNTER — Emergency Department (HOSPITAL_COMMUNITY)
Admission: EM | Admit: 2017-05-25 | Discharge: 2017-05-25 | Disposition: A | Discharge status: Home / Self Care | Payer: Medicaid Other | Attending: Emergency Medicine | Admitting: Emergency Medicine

## 2017-05-25 ENCOUNTER — Encounter (HOSPITAL_COMMUNITY): Payer: Self-pay

## 2017-05-25 DIAGNOSIS — Z79891 Long term (current) use of opiate analgesic: Secondary | ICD-10-CM | POA: Diagnosis not present

## 2017-05-25 DIAGNOSIS — M62830 Muscle spasm of back: Secondary | ICD-10-CM

## 2017-05-25 DIAGNOSIS — M546 Pain in thoracic spine: Secondary | ICD-10-CM | POA: Diagnosis present

## 2017-05-25 MED ORDER — CYCLOBENZAPRINE HCL 10 MG PO TABS: 10.0000 mg | ORAL_TABLET | Freq: Two times a day (BID) | ORAL | 0 refills | Status: DC | PRN

## 2017-05-25 NOTE — ED Provider Notes (Signed)
[facility] DEPT Provider Note   CSN: [id number] Arrival date & time: 05/25/17  0033     History   Chief Complaint Chief Complaint  Patient presents with  . Back Pain    HPI Felicia Collier is a 61 y.o. female.  Patient presents emergency department with chief complaint of back pain. She states that her right upper back muscles are bothering her. She states that they feel tight today. She states pain is moderate. She denies any chest pain, shortness of breath, low back pain, bowel or bladder incontinence, numbness, weakness, or tingling. She has not taken anything for symptoms. There are no other associated symptoms. The symptoms are worsened with palpation and movement. She denies any traumatic injury.   The history is provided by the patient. No language interpreter was used.    Past Medical History:  Diagnosis Date  . Chronic knee pain   . Schizophrenia [facility])     Patient Active Problem List   Diagnosis Date Noted  . Schizophrenia (HCC) 05/01/2017  . Chronic pain of left knee 02/09/2017  . Unilateral primary osteoarthritis, left knee 02/09/2017  . Neuropathy 11/22/2016  . Gastroesophageal reflux disease without esophagitis 11/22/2016  . Weight gain 11/22/2016  . Atypical chest pain 11/04/2016    Past Surgical History:  Procedure Laterality Date  . CHOLECYSTECTOMY      OB History    No data available       Home Medications    Prior to Admission medications   Medication Sig Start Date End Date Taking? Authorizing Provider  cyclobenzaprine (FLEXERIL) 10 MG tablet Take 1 tablet (10 mg total) by mouth 2 (two) times daily as needed for muscle spasms. 05/25/17   Roxy Horseman, PA-C  dicyclomine (BENTYL) 20 MG tablet Take 1 tablet (20 mg total) by mouth 2 (two) times daily. Patient not taking: Reported on 04/27/2017 04/13/17   Bing Neighbors, FNP  dicyclomine (BENTYL) 20 MG tablet Take 1 tablet (20 mg total) by mouth 2 (two) times daily. 04/13/17   Bing Neighbors, FNP  gabapentin (NEURONTIN) 300 MG capsule Take 1 capsule (300 mg total) by mouth 3 (three) times daily. 04/13/17   Bing Neighbors, FNP  hydrocortisone cream 1 % Apply 1 application topically 2 (two) times daily. Do not apply to face Patient not taking: Reported on 04/27/2017 04/12/17   Arthor Captain, PA-C  methocarbamol (ROBAXIN) 500 MG tablet Take 1 tablet (500 mg total) by mouth 2 (two) times daily. 04/13/17   Bing Neighbors, FNP  methylPREDNISolone (MEDROL) 4 MG tablet Medrol dose pack. Take as instructed 05/17/17   Kathryne Hitch, MD  naproxen (NAPROSYN) 375 MG tablet Take 1 tablet (375 mg total) by mouth 2 (two) times daily. 04/10/17   Maxwell Caul, PA-C  omeprazole (PRILOSEC) 40 MG capsule Take 1 capsule (40 mg total) by mouth daily. 03/09/17   Bing Neighbors, FNP  ondansetron (ZOFRAN-ODT) 4 MG disintegrating tablet Take 1 tablet (4 mg total) by mouth every 8 (eight) hours as needed for nausea. 03/02/17   Bing Neighbors, FNP  potassium chloride (K-DUR) 10 MEQ tablet Take 1 tablet (10 mEq total) by mouth daily. 03/02/17   Bing Neighbors, FNP    Family History History reviewed. No pertinent family history.  Social History Social History  Substance Use Topics  . Smoking status: Never Smoker  . Smokeless tobacco: Never Used  . Alcohol use No     Allergies   Patient has no known allergies.  Review of Systems Review of Systems  All other systems reviewed and are negative.    Physical Exam Updated Vital Signs BP (!) 150/86 (BP Location: Left Arm)   Pulse 70   Temp 97.6 F (36.4 C) (Oral)   Resp 18   SpO2 99%   Physical Exam Physical Exam  Constitutional: Pt appears well-developed and well-nourished. No distress.  HENT:  Head: Normocephalic and atraumatic.  Mouth/Throat: Oropharynx is clear and moist. No oropharyngeal exudate.  Eyes: Conjunctivae are normal.  Neck: Normal range of motion. Neck supple.  No  meningismus Cardiovascular: Normal rate, regular rhythm and intact distal pulses.   Pulmonary/Chest: Effort normal and breath sounds normal. No respiratory distress. Pt has no wheezes.  Abdominal: Pt exhibits no distension Musculoskeletal:  Right upper trapezius tender to palpation, no bony CTLS spine tenderness, deformity, step-off, or crepitus Lymphadenopathy: Pt has no cervical adenopathy.  Neurological: Pt is alert and oriented Speech is clear and goal oriented, follows commands Normal 5/5 strength in upper and lower extremities bilaterally including dorsiflexion and plantar flexion, strong and equal grip strength Sensation intact Great toe extension intact Moves extremities without ataxia, coordination intact  Normal gait Normal balance No Clonus Skin: Skin is warm and dry. No rash noted. Pt is not diaphoretic. No erythema.  Psychiatric: Pt has a normal mood and affect. Behavior is normal.  Nursing note and vitals reviewed.   ED Treatments / Results  Labs (all labs ordered are listed, but only abnormal results are displayed) Labs Reviewed - No data to display  EKG  EKG Interpretation None       Radiology No results found.  Procedures Procedures (including critical care time)  Medications Ordered in ED Medications - No data to display   Initial Impression / Assessment and Plan / ED Course  I have reviewed the triage vital signs and the nursing notes.  Pertinent labs & imaging results that were available during my care of the patient were reviewed by me and considered in my medical decision making (see chart for details).     Patient with back pain.  No neurological deficits and normal neuro exam.  Patient is ambulatory.  No loss of bowel or bladder control.  Doubt cauda equina.  Denies fever,  doubt epidural abscess or other lesion. Recommend back exercises, stretching, RICE, and will treat with a short course of flexeril.   Final Clinical Impressions(s) / ED  Diagnoses   Final diagnoses:  Muscle spasm of back    New Prescriptions New Prescriptions   CYCLOBENZAPRINE (FLEXERIL) 10 MG TABLET    Take 1 tablet (10 mg total) by mouth 2 (two) times daily as needed for muscle spasms.     Roxy HorsemanBrowning, Tempestt Silba, PA-C 05/25/17 0151    Palumbo, April, MD 05/25/17 [phone]

## 2017-05-25 NOTE — ED Triage Notes (Signed)
Pt complains of upper back pain for "a good while," she states No injury noted Pt states that it hurts to take a deep breath and it makes her eyes water

## 2017-05-26 ENCOUNTER — Encounter (HOSPITAL_COMMUNITY): Payer: Self-pay | Admitting: Emergency Medicine

## 2017-05-26 ENCOUNTER — Emergency Department (HOSPITAL_COMMUNITY)
Admission: EM | Admit: 2017-05-26 | Discharge: 2017-05-26 | Disposition: A | Discharge status: Home / Self Care | Payer: Medicaid Other | Attending: Emergency Medicine | Admitting: Emergency Medicine

## 2017-05-26 DIAGNOSIS — Z5321 Procedure and treatment not carried out due to patient leaving prior to being seen by health care provider: Secondary | ICD-10-CM | POA: Diagnosis not present

## 2017-05-26 DIAGNOSIS — M79642 Pain in left hand: Secondary | ICD-10-CM | POA: Diagnosis present

## 2017-05-26 DIAGNOSIS — M25562 Pain in left knee: Secondary | ICD-10-CM | POA: Insufficient documentation

## 2017-05-26 NOTE — ED Notes (Signed)
No answer in lobby.

## 2017-05-26 NOTE — ED Notes (Signed)
Called patient and no answer.

## 2017-05-26 NOTE — ED Notes (Signed)
Called patient to room and no answer. 

## 2017-05-26 NOTE — ED Triage Notes (Signed)
Pt complaint of chronic left hand and left knee pain; pt verbalizes "seen the doctor but all they said they can give me is a shot and the shot makes it worse." Denies fall/injury.

## 2017-05-27 ENCOUNTER — Emergency Department (HOSPITAL_COMMUNITY)
Admission: EM | Admit: 2017-05-27 | Discharge: 2017-05-27 | Discharge status: Against Medical Advice | Payer: Medicaid Other | Attending: Emergency Medicine | Admitting: Emergency Medicine

## 2017-05-27 ENCOUNTER — Encounter (HOSPITAL_COMMUNITY): Payer: Self-pay | Admitting: Emergency Medicine

## 2017-05-27 DIAGNOSIS — M25539 Pain in unspecified wrist: Secondary | ICD-10-CM | POA: Insufficient documentation

## 2017-05-27 DIAGNOSIS — Z79899 Other long term (current) drug therapy: Secondary | ICD-10-CM | POA: Diagnosis not present

## 2017-05-27 DIAGNOSIS — G8929 Other chronic pain: Secondary | ICD-10-CM | POA: Diagnosis not present

## 2017-05-27 DIAGNOSIS — M25562 Pain in left knee: Secondary | ICD-10-CM | POA: Diagnosis not present

## 2017-05-27 NOTE — ED Triage Notes (Signed)
Patient is complaining of hand pain and knee pain. Patient stated that she went to her doctor and he said that he would give her shots. Patient told md she could not take shots. MD per patient told patient that there is nothing else they could do for her.

## 2017-05-27 NOTE — ED Notes (Signed)
Pt left AMA, and prior to Discharge information.

## 2017-05-27 NOTE — ED Notes (Signed)
Pt is c/o pain to right hip/pevlvis and wrist. Pt states that she had stop taking  Cyclobenzaprine as she felt that it made it worse and that she has been bing followed by Dr. Rayburn MaBlackmon who want to give her steroid injection but at this point pt stated that she refused.

## 2017-05-27 NOTE — ED Provider Notes (Signed)
[facility] Provider Note   CSN: [id number] Arrival date & time: 05/27/17  0402     History   Chief Complaint Chief Complaint  Patient presents with  . Knee Pain  . Hand Pain    HPI Felicia Collier is a 61 y.o. female.  Patient presents to the emergency department with chief complaint of chronic knee pain.  She states that she saw the orthopedic doctor who offered her injections, but she states that she cannot take injections. She denies any new injury. She also states that she has some arthritis in her wrists. She denies any fevers chills. She is interested in getting a second opinion from another orthopedic doctor.   The history is provided by the patient. No language interpreter was used.    Past Medical History:  Diagnosis Date  . Chronic knee pain   . Schizophrenia [facility])     Patient Active Problem List   Diagnosis Date Noted  . Schizophrenia (HCC) 05/01/2017  . Chronic pain of left knee 02/09/2017  . Unilateral primary osteoarthritis, left knee 02/09/2017  . Neuropathy 11/22/2016  . Gastroesophageal reflux disease without esophagitis 11/22/2016  . Weight gain 11/22/2016  . Atypical chest pain 11/04/2016    Past Surgical History:  Procedure Laterality Date  . CHOLECYSTECTOMY      OB History    No data available       Home Medications    Prior to Admission medications   Medication Sig Start Date End Date Taking? Authorizing Provider  cyclobenzaprine (FLEXERIL) 10 MG tablet Take 1 tablet (10 mg total) by mouth 2 (two) times daily as needed for muscle spasms. 05/25/17   Roxy Horseman, PA-C  dicyclomine (BENTYL) 20 MG tablet Take 1 tablet (20 mg total) by mouth 2 (two) times daily. Patient not taking: Reported on 04/27/2017 04/13/17   Bing Neighbors, FNP  dicyclomine (BENTYL) 20 MG tablet Take 1 tablet (20 mg total) by mouth 2 (two) times daily. 04/13/17   Bing Neighbors, FNP  gabapentin (NEURONTIN) 300 MG capsule Take 1 capsule (300 mg total)  by mouth 3 (three) times daily. 04/13/17   Bing Neighbors, FNP  hydrocortisone cream 1 % Apply 1 application topically 2 (two) times daily. Do not apply to face Patient not taking: Reported on 04/27/2017 04/12/17   Arthor Captain, PA-C  methocarbamol (ROBAXIN) 500 MG tablet Take 1 tablet (500 mg total) by mouth 2 (two) times daily. 04/13/17   Bing Neighbors, FNP  methylPREDNISolone (MEDROL) 4 MG tablet Medrol dose pack. Take as instructed 05/17/17   Kathryne Hitch, MD  naproxen (NAPROSYN) 375 MG tablet Take 1 tablet (375 mg total) by mouth 2 (two) times daily. 04/10/17   Maxwell Caul, PA-C  omeprazole (PRILOSEC) 40 MG capsule Take 1 capsule (40 mg total) by mouth daily. 03/09/17   Bing Neighbors, FNP  ondansetron (ZOFRAN-ODT) 4 MG disintegrating tablet Take 1 tablet (4 mg total) by mouth every 8 (eight) hours as needed for nausea. 03/02/17   Bing Neighbors, FNP  potassium chloride (K-DUR) 10 MEQ tablet Take 1 tablet (10 mEq total) by mouth daily. 03/02/17   Bing Neighbors, FNP    Family History History reviewed. No pertinent family history.  Social History Social History  Substance Use Topics  . Smoking status: Never Smoker  . Smokeless tobacco: Never Used  . Alcohol use No     Allergies   Patient has no known allergies.   Review of Systems Review of  Systems  All other systems reviewed and are negative.    Physical Exam Updated Vital Signs BP (!) 151/75 (BP Location: Left Arm)   Temp 97.6 F (36.4 C) (Oral)   Resp 14   Ht 5\' 4"  (1.626 m)   Wt 72.1 kg (159 lb)   SpO2 93%   BMI 27.29 kg/m   Physical Exam  Constitutional: She is oriented to person, place, and time. She appears well-developed and well-nourished.  HENT:  Head: Normocephalic and atraumatic.  Eyes: Conjunctivae and EOM are normal.  Neck: Normal range of motion.  Cardiovascular: Normal rate.   Pulmonary/Chest: Effort normal.  Abdominal: She exhibits no distension.    Musculoskeletal: Normal range of motion. She exhibits no edema, tenderness or deformity.  Neurological: She is alert and oriented to person, place, and time.  Skin: Skin is dry.  Psychiatric: She has a normal mood and affect. Her behavior is normal. Judgment and thought content normal.  Nursing note and vitals reviewed.    ED Treatments / Results  Labs (all labs ordered are listed, but only abnormal results are displayed) Labs Reviewed - No data to display  EKG  EKG Interpretation None       Radiology No results found.  Procedures Procedures (including critical care time)  Medications Ordered in ED Medications - No data to display   Initial Impression / Assessment and Plan / ED Course  I have reviewed the triage vital signs and the nursing notes.  Pertinent labs & imaging results that were available during my care of the patient were reviewed by me and considered in my medical decision making (see chart for details).     Patient with chronic pain.  Advised follow-up with PCP, pain management, or orthopedics.   Final Clinical Impressions(s) / ED Diagnoses   Final diagnoses:  Chronic pain of left knee    New Prescriptions New Prescriptions   No medications on file     Roxy HorsemanBrowning, Zaniya Mcaulay, Cordelia Poche-C 05/27/17 0515    Dione BoozeGlick, David, MD 05/27/17 [phone]

## 2017-05-31 ENCOUNTER — Encounter: Payer: Self-pay | Admitting: Family Medicine

## 2017-05-31 ENCOUNTER — Ambulatory Visit (INDEPENDENT_AMBULATORY_CARE_PROVIDER_SITE_OTHER): Payer: Medicaid Other | Admitting: Family Medicine

## 2017-05-31 VITALS — BP 130/68 | HR 67 | Temp 97.9°F | Resp 16 | Ht 64.0 in | Wt 158.2 lb

## 2017-05-31 DIAGNOSIS — M25551 Pain in right hip: Secondary | ICD-10-CM | POA: Diagnosis not present

## 2017-05-31 DIAGNOSIS — G8929 Other chronic pain: Secondary | ICD-10-CM

## 2017-05-31 DIAGNOSIS — F203 Undifferentiated schizophrenia: Secondary | ICD-10-CM

## 2017-05-31 DIAGNOSIS — M545 Low back pain: Secondary | ICD-10-CM

## 2017-05-31 DIAGNOSIS — M25552 Pain in left hip: Secondary | ICD-10-CM | POA: Diagnosis not present

## 2017-05-31 LAB — POCT URINALYSIS DIP (DEVICE)
BILIRUBIN URINE: NEGATIVE
Glucose, UA: NEGATIVE mg/dL
KETONES UR: NEGATIVE mg/dL
Nitrite: NEGATIVE
PH: 6 (ref 5.0–8.0)
PROTEIN: NEGATIVE mg/dL
SPECIFIC GRAVITY, URINE: 1.02 (ref 1.005–1.030)
Urobilinogen, UA: 0.2 mg/dL (ref 0.0–1.0)

## 2017-05-31 MED ORDER — GABAPENTIN 300 MG PO CAPS: 300.0000 mg | ORAL_CAPSULE | Freq: Three times a day (TID) | ORAL | 3 refills | Status: DC

## 2017-05-31 MED ORDER — CYCLOBENZAPRINE HCL 10 MG PO TABS: 10.0000 mg | ORAL_TABLET | Freq: Two times a day (BID) | ORAL | 0 refills | Status: DC | PRN

## 2017-05-31 NOTE — Patient Instructions (Signed)
For chronic pain: -Take gabapentin 300 mg 3 times daily for back pain, hip pain, and knee pain. -For severe pain and spasms, take cyclobenzaprine 10 mg up to twice daily as needed.   Chronic Pain, Adult Chronic pain is a type of pain that lasts or keeps coming back (recurs) for at least six months. You may have chronic headaches, abdominal pain, or body pain. Chronic pain may be related to an illness, such as fibromyalgia or complex regional pain syndrome. Sometimes the cause of chronic pain is not known. Chronic pain can make it hard for you to do daily activities. If not treated, chronic pain can lead to other health problems, including anxiety and depression. Treatment depends on the cause and severity of your pain. You may need to work with a pain specialist to come up with a treatment plan. The plan may include medicine, counseling, and physical therapy. Many people benefit from a combination of two or more types of treatment to control their pain. Follow these instructions at home: Lifestyle  Consider keeping a pain diary to share with your health care providers.  Consider talking with a mental health care provider (psychologist) about how to cope with chronic pain.  Consider joining a chronic pain support group.  Try to control or lower your stress levels. Talk to your health care provider about strategies to do this. General instructions   Take over-the-counter and prescription medicines only as told by your health care provider.  Follow your treatment plan as told by your health care provider. This may include: ? Gentle, regular exercise. ? Eating a healthy diet that includes foods such as vegetables, fruits, fish, and lean meats. ? Cognitive or behavioral therapy. ? Working with a Adult nursephysical therapist. ? Meditation or yoga. ? Acupuncture or massage therapy. ? Aroma, color, light, or sound therapy. ? Local electrical stimulation. ? Shots (injections) of numbing or  pain-relieving medicines into the spine or the area of pain.  Check your pain level as told by your health care provider. Ask your health care provider if you should use a pain scale.  Learn as much as you can about how to manage your chronic pain. Ask your health care provider if an intensive pain rehabilitation program or a chronic pain specialist would be helpful.  Keep all follow-up visits as told by your health care provider. This is important. Contact a health care provider if:  Your pain gets worse.  You have new pain.  You have trouble sleeping.  You have trouble doing your normal activities.  Your pain is not controlled with treatment.  Your have side effects from pain medicine.  You feel weak. Get help right away if:  You lose feeling or have numbness in your body.  You lose control of bowel or bladder function.  Your pain suddenly gets much worse.  You develop shaking or chills.  You develop confusion.  You develop chest pain.  You have trouble breathing or shortness of breath.  You pass out.  You have thoughts about hurting yourself or others. This information is not intended to replace advice given to you by your health care provider. Make sure you discuss any questions you have with your health care provider. Document Released: 07/24/2002 Document Revised: 07/01/2016 Document Reviewed: 04/20/2016 Elsevier Interactive Patient Education  2017 [employer]

## 2017-05-31 NOTE — Progress Notes (Signed)
Patient ID: Felicia Collier, female    DOB: 1956-09-28, 61 y.o.   MRN: [id number]  PCP: Bing Neighbors, FNP  Chief Complaint  Patient presents with  . Hospitalization Follow-up  . Back Pain  . Hip Pain    Subjective:  HPI Felicia Collier is a 61 y.o. female presents for evaluation of chronic back and hip pain. She has had multiple visits over the last few months at the emergency department with complaints of chronic back pain and hip pain. She reports that she has been taken the medications previously prescribed her symptoms for only a short period of time and once symptoms resolve she throws the medication in the trash. Today she reports low back pain and bilateral hip pain. Today she has not taken any medication reports that she no longer has the gabapentin, robaxin, or cyclobenzaprine. Felicia Collier has recently visited Dr. Deboraha Sprang at [facility] for evaluation of her chronic knee pain and was recently prescribed a Medrol Dosepak. She reports completing this medication. During Felicia Collier'Collier last visit here in clinic over a month ago she was advised to follow up with [facility], she reports today that she has not turned to [place].  Social History   Social History  . Marital status: Single    Spouse name: N/A  . Number of children: N/A  . Years of education: N/A   Occupational History  . Not on file.   Social History Main Topics  . Smoking status: Never Smoker  . Smokeless tobacco: Never Used  . Alcohol use No  . Drug use: No  . Sexual activity: Not on file   Other Topics Concern  . Not on file   Social History Narrative  . No narrative on file    No family history on file. Review of Systems See history of present illness Patient Active Problem List   Diagnosis Date Noted  . Schizophrenia (HCC) 05/01/2017  . Chronic pain of left knee 02/09/2017  . Unilateral primary osteoarthritis, left knee 02/09/2017  . Neuropathy 11/22/2016  .  Gastroesophageal reflux disease without esophagitis 11/22/2016  . Weight gain 11/22/2016  . Atypical chest pain 11/04/2016    No Known Allergies  Prior to Admission medications   Medication Sig Start Date End Date Taking? Authorizing Provider  cyclobenzaprine (FLEXERIL) 10 MG tablet Take 1 tablet (10 mg total) by mouth 2 (two) times daily as needed for muscle spasms. 05/25/17  Yes Felicia Horseman, PA-C  dicyclomine (BENTYL) 20 MG tablet Take 1 tablet (20 mg total) by mouth 2 (two) times daily. 04/13/17  Yes Bing Neighbors, FNP  gabapentin (NEURONTIN) 300 MG capsule Take 1 capsule (300 mg total) by mouth 3 (three) times daily. 04/13/17  Yes Bing Neighbors, FNP  hydrocortisone cream 1 % Apply 1 application topically 2 (two) times daily. Do not apply to face 04/12/17  Yes Felicia Collier, Abigail, PA-C  naproxen (NAPROSYN) 375 MG tablet Take 1 tablet (375 mg total) by mouth 2 (two) times daily. 04/10/17  Yes Maxwell Caul, PA-C  omeprazole (PRILOSEC) 40 MG capsule Take 1 capsule (40 mg total) by mouth daily. 03/09/17  Yes Bing Neighbors, FNP  ondansetron (ZOFRAN-ODT) 4 MG disintegrating tablet Take 1 tablet (4 mg total) by mouth every 8 (eight) hours as needed for nausea. 03/02/17  Yes Bing Neighbors, FNP  potassium chloride (K-DUR) 10 MEQ tablet Take 1 tablet (10 mEq total) by mouth daily. 03/02/17  Yes Bing Neighbors, FNP  dicyclomine (BENTYL) 20  MG tablet Take 1 tablet (20 mg total) by mouth 2 (two) times daily. Patient not taking: Reported on 04/27/2017 04/13/17   Bing NeighborsHarris, Felicia Holck S, FNP  methocarbamol (ROBAXIN) 500 MG tablet Take 1 tablet (500 mg total) by mouth 2 (two) times daily. 04/13/17   Bing NeighborsHarris, Felicia Capshaw S, FNP    Past Medical, Surgical Family and Social History reviewed and updated.    Objective:   Today'Collier Vitals   05/31/17 0842  BP: 130/68  Pulse: 67  Resp: 16  Temp: 97.9 F (36.6 C)  TempSrc: Oral  SpO2: 99%  Weight: 158 lb 3.7 oz (71.8 kg)  Height: 5\' 4"  (1.626  m)    Wt Readings from Last 3 Encounters:  05/31/17 158 lb 3.7 oz (71.8 kg)  05/27/17 159 lb (72.1 kg)  05/17/17 159 lb (72.1 kg)   Physical Exam  Constitutional: She is oriented to person, place, and time. She appears well-developed and well-nourished.  HENT:  Head: Normocephalic and atraumatic.  Eyes: Pupils are equal, round, and reactive to light. Conjunctivae are normal.  Neck: Normal range of motion. Neck supple.  Cardiovascular: Normal rate, regular rhythm, normal heart sounds and intact distal pulses.   Pulmonary/Chest: Effort normal and breath sounds normal.  Musculoskeletal:  Subjective back pain and hip pain. Nonreproducible on exam, no bony tenderness, no limited range of motion.  Neurological: She is alert and oriented to person, place, and time.  Psychiatric: She has a normal mood and affect. Her behavior is normal. Judgment and thought content normal.     Assessment & Plan:  1. Chronic bilateral low back pain without sciatica 2. Pain of both hip joints 3. Undifferentiated Schizophrenia   I have requested to gabapentin 300 mg 3 times a day. I have a record cyclobenzaprine 10 mg up to 2 times daily as needed for acute pain. [facility].  Return for care if symptoms worsen or do not improve.   Godfrey PickKimberly Collier. Tiburcio PeaHarris, MSN, FNP-C [facility]  [address] [place], [place] [address] [phone]

## 2017-06-09 ENCOUNTER — Encounter (HOSPITAL_COMMUNITY): Payer: Self-pay | Admitting: Emergency Medicine

## 2017-06-09 ENCOUNTER — Emergency Department (HOSPITAL_COMMUNITY)
Admission: EM | Admit: 2017-06-09 | Discharge: 2017-06-09 | Disposition: A | Discharge status: Home / Self Care | Payer: Medicaid Other | Attending: Emergency Medicine | Admitting: Emergency Medicine

## 2017-06-09 DIAGNOSIS — R197 Diarrhea, unspecified: Secondary | ICD-10-CM | POA: Insufficient documentation

## 2017-06-09 DIAGNOSIS — Z79899 Other long term (current) drug therapy: Secondary | ICD-10-CM | POA: Insufficient documentation

## 2017-06-09 DIAGNOSIS — K625 Hemorrhage of anus and rectum: Secondary | ICD-10-CM | POA: Diagnosis present

## 2017-06-09 DIAGNOSIS — K649 Unspecified hemorrhoids: Secondary | ICD-10-CM | POA: Diagnosis not present

## 2017-06-09 MED ORDER — HYDROCORTISONE ACETATE 25 MG RE SUPP: 25.0000 mg | Freq: Two times a day (BID) | RECTAL | 0 refills | Status: DC

## 2017-06-09 NOTE — Discharge Instructions (Signed)
Soak in a warm tub 2 or 3 times a day to help the hemorrhoids improved.  Drink plenty of fluids.  You can also get hydrocortisone cream, 1%, at the pharmacy, to use on the anus, to help the discomfort.

## 2017-06-09 NOTE — ED Provider Notes (Signed)
[facility]-EMERGENCY DEPT Provider Note   CSN: [id number] Arrival date & time: 06/09/17  1956     History   Chief Complaint Chief Complaint  Patient presents with  . Rectal Bleeding  . Diarrhea  . Hemorrhoids    HPI Felicia Collier is a 61 y.o. female.  She presents for evaluation of burning pain on the anus, associated with hemorrhoidal swelling, and hemorrhoidal bleeding, with wiping.  She denies fever, chills, vomiting, cough, chest pain or abdominal pain.  History of similar problems several times.  Stools today have been looser than usual.  No recent episodes of constipation.  There are no other known modifying factors.  HPI  Past Medical History:  Diagnosis Date  . Chronic knee pain   . Schizophrenia [facility](HCC)     Patient Active Problem List   Diagnosis Date Noted  . Schizophrenia (HCC) 05/01/2017  . Chronic pain of left knee 02/09/2017  . Unilateral primary osteoarthritis, left knee 02/09/2017  . Neuropathy 11/22/2016  . Gastroesophageal reflux disease without esophagitis 11/22/2016  . Weight gain 11/22/2016  . Atypical chest pain 11/04/2016    Past Surgical History:  Procedure Laterality Date  . CHOLECYSTECTOMY      OB History    No data available       Home Medications    Prior to Admission medications   Medication Sig Start Date End Date Taking? Authorizing Provider  cyclobenzaprine (FLEXERIL) 10 MG tablet Take 1 tablet (10 mg total) by mouth 2 (two) times daily as needed for muscle spasms. 05/31/17   Bing NeighborsHarris, Kimberly S, FNP  dicyclomine (BENTYL) 20 MG tablet Take 1 tablet (20 mg total) by mouth 2 (two) times daily. Patient not taking: Reported on 04/27/2017 04/13/17   Bing NeighborsHarris, Kimberly S, FNP  dicyclomine (BENTYL) 20 MG tablet Take 1 tablet (20 mg total) by mouth 2 (two) times daily. 04/13/17   Bing NeighborsHarris, Kimberly S, FNP  gabapentin (NEURONTIN) 300 MG capsule Take 1 capsule (300 mg total) by mouth 3 (three) times daily. 05/31/17   Bing NeighborsHarris, Kimberly S, FNP    hydrocortisone (ANUSOL-HC) 25 MG suppository Place 1 suppository (25 mg total) rectally 2 (two) times daily. For 7 days 06/09/17   Mancel BaleWentz, Nashya Garlington, MD  hydrocortisone cream 1 % Apply 1 application topically 2 (two) times daily. Do not apply to face 04/12/17   Arthor CaptainHarris, Abigail, PA-C  methocarbamol (ROBAXIN) 500 MG tablet Take 1 tablet (500 mg total) by mouth 2 (two) times daily. 04/13/17   Bing NeighborsHarris, Kimberly S, FNP  naproxen (NAPROSYN) 375 MG tablet Take 1 tablet (375 mg total) by mouth 2 (two) times daily. 04/10/17   Maxwell CaulLayden, Lindsey A, PA-C  omeprazole (PRILOSEC) 40 MG capsule Take 1 capsule (40 mg total) by mouth daily. 03/09/17   Bing NeighborsHarris, Kimberly S, FNP  ondansetron (ZOFRAN-ODT) 4 MG disintegrating tablet Take 1 tablet (4 mg total) by mouth every 8 (eight) hours as needed for nausea. 03/02/17   Bing NeighborsHarris, Kimberly S, FNP  potassium chloride (K-DUR) 10 MEQ tablet Take 1 tablet (10 mEq total) by mouth daily. 03/02/17   Bing NeighborsHarris, Kimberly S, FNP    Family History History reviewed. No pertinent family history.  Social History Social History  Substance Use Topics  . Smoking status: Never Smoker  . Smokeless tobacco: Never Used  . Alcohol use No     Allergies   Patient has no known allergies.   Review of Systems Review of Systems  All other systems reviewed and are negative.    Physical Exam Updated Vital  Signs BP 119/74   Pulse 76   Temp 98.3 F (36.8 C) (Oral)   Resp 16   Ht 5\' 4"  (1.626 m)   Wt 71.7 kg (158 lb)   SpO2 96%   BMI 27.12 kg/m   Physical Exam  Constitutional: She is oriented to person, place, and time. She appears well-developed and well-nourished. No distress.  HENT:  Head: Normocephalic and atraumatic.  Eyes: Pupils are equal, round, and reactive to light. Conjunctivae and EOM are normal.  Neck: Normal range of motion and phonation normal. Neck supple.  Cardiovascular: Normal rate and regular rhythm.   Pulmonary/Chest: Effort normal and breath sounds normal. She  exhibits no tenderness.  Abdominal: Soft. She exhibits no distension. There is no tenderness. There is no guarding.  Genitourinary:  Genitourinary Comments: Soft external hemorrhoids, small area of erosion anteriorly, with blood, no areas of thrombosis.  No tenderness pain or mass on digital exam.  Musculoskeletal: Normal range of motion.  Neurological: She is alert and oriented to person, place, and time. She exhibits normal muscle tone.  Skin: Skin is warm and dry.  Psychiatric: She has a normal mood and affect. Her behavior is normal. Judgment and thought content normal.  Nursing note and vitals reviewed.    ED Treatments / Results  Labs (all labs ordered are listed, but only abnormal results are displayed) Labs Reviewed - No data to display  EKG  EKG Interpretation None       Radiology No results found.  Procedures Procedures (including critical care time)  Medications Ordered in ED Medications - No data to display   Initial Impression / Assessment and Plan / ED Course  I have reviewed the triage vital signs and the nursing notes.  Pertinent labs & imaging results that were available during my care of the patient were reviewed by me and considered in my medical decision making (see chart for details).      Patient Vitals for the past 24 hrs:  BP Temp Temp src Pulse Resp SpO2 Height Weight  06/09/17 2012 119/74 98.3 F (36.8 C) Oral 76 16 96 % - -  06/09/17 2009 - - - - - - 5\' 4"  (1.626 m) 71.7 kg (158 lb)    8:53 PM Reevaluation with update and discussion. After initial assessment and treatment, an updated evaluation reveals no change in clinical status.  Findings discussed with the patient, all questions answered. Alyona Romack L    Final Clinical Impressions(s) / ED Diagnoses   Final diagnoses:  Hemorrhoids, unspecified hemorrhoid type    Recurrent hemorrhoids, uncomfortable, with mild bleeding.  Hemodynamically stable.  Patient with history of  somatization disorder.  Frequent visits to the emergency department for various reasons.  Doubt hemodynamic stability, severe rectal bleeding or metabolic instability.  Nursing Notes Reviewed/ Care Coordinated Applicable Imaging Reviewed Interpretation of Laboratory Data incorporated into ED treatment  The patient appears reasonably screened and/or stabilized for discharge and I doubt any other medical condition or other [facility] requiring further screening, evaluation, or treatment in the ED at this time prior to discharge.  Plan: Home Medications-continue usual medications, also use hydrocortisone cream on anus as needed; Home Treatments-warm soaks 2 or 3 times a day; return here if the recommended treatment, does not improve the symptoms; Recommended follow up-PCP checkup in 1 week and as needed.   New Prescriptions Discharge Medication List as of 06/09/2017  8:43 PM    START taking these medications   Details  hydrocortisone (ANUSOL-HC) 25 MG suppository  Place 1 suppository (25 mg total) rectally 2 (two) times daily. For 7 days, Starting Thu 06/09/2017, Print         Mancel BaleWentz, Adalea Handler, MD 06/09/17 [phone]

## 2017-06-09 NOTE — ED Triage Notes (Signed)
Pt presents to ED with c/o diarrhea and rectal bleed, onset this morning. Pt states she has hemorrhoids.

## 2017-06-16 ENCOUNTER — Emergency Department (HOSPITAL_COMMUNITY)
Admission: EM | Admit: 2017-06-16 | Discharge: 2017-06-17 | Disposition: A | Discharge status: Home / Self Care | Payer: Medicaid Other | Attending: Physician Assistant | Admitting: Physician Assistant

## 2017-06-16 ENCOUNTER — Encounter (HOSPITAL_COMMUNITY): Payer: Self-pay | Admitting: *Deleted

## 2017-06-16 DIAGNOSIS — N898 Other specified noninflammatory disorders of vagina: Secondary | ICD-10-CM | POA: Diagnosis present

## 2017-06-16 DIAGNOSIS — Z79899 Other long term (current) drug therapy: Secondary | ICD-10-CM | POA: Diagnosis not present

## 2017-06-16 DIAGNOSIS — N952 Postmenopausal atrophic vaginitis: Secondary | ICD-10-CM | POA: Diagnosis not present

## 2017-06-16 DIAGNOSIS — N3000 Acute cystitis without hematuria: Secondary | ICD-10-CM | POA: Insufficient documentation

## 2017-06-16 LAB — URINALYSIS, ROUTINE W REFLEX MICROSCOPIC
BILIRUBIN URINE: NEGATIVE
Glucose, UA: NEGATIVE mg/dL
HGB URINE DIPSTICK: NEGATIVE
KETONES UR: NEGATIVE mg/dL
Nitrite: NEGATIVE
PH: 5 (ref 5.0–8.0)
Protein, ur: NEGATIVE mg/dL
Specific Gravity, Urine: 1.02 (ref 1.005–1.030)

## 2017-06-16 LAB — WET PREP, GENITAL
Clue Cells Wet Prep HPF POC: NONE SEEN
Sperm: NONE SEEN
TRICH WET PREP: NONE SEEN
YEAST WET PREP: NONE SEEN

## 2017-06-16 MED ORDER — CEPHALEXIN 500 MG PO CAPS: 500.0000 mg | ORAL_CAPSULE | Freq: Four times a day (QID) | ORAL | 0 refills | Status: DC

## 2017-06-16 MED ORDER — ESTRADIOL 0.75 MG/1.25 GM (0.06%) TD GEL: 1.2500 g | Freq: Every day | TRANSDERMAL | 12 refills | Status: DC

## 2017-06-16 NOTE — ED Triage Notes (Signed)
Pt reports burning in vaginal area for the past 2 hours. Pt denies discharge or itching.

## 2017-06-16 NOTE — Discharge Instructions (Signed)
Evidence of likely urinary tract infection however think the burning in your vagina may be due to lack of estrogen. You  can try this topical estrogen cream to help with your symptoms.

## 2017-06-16 NOTE — ED Notes (Signed)
Refused last set of vitals

## 2017-06-16 NOTE — ED Provider Notes (Signed)
[facility]-EMERGENCY DEPT Provider Note   CSN: [id number] Arrival date & time: 06/16/17  1934     History   Chief Complaint Chief Complaint  Patient presents with  . Vaginal burning    HPI Felicia LatheSheila Collier is a 61 y.o. female.  HPI   Patient is [age] year old female with schizophrenia and chronic knee pain presenting with burning in her vagina. She reports it feels dry. She reports is worse with urinating. She reports even without urinating she feels burning in her vagina. No discharge. Patient reports no sexual contacts.  Past Medical History:  Diagnosis Date  . Chronic knee pain   . Schizophrenia [facility](HCC)     Patient Active Problem List   Diagnosis Date Noted  . Schizophrenia (HCC) 05/01/2017  . Chronic pain of left knee 02/09/2017  . Unilateral primary osteoarthritis, left knee 02/09/2017  . Neuropathy 11/22/2016  . Gastroesophageal reflux disease without esophagitis 11/22/2016  . Weight gain 11/22/2016  . Atypical chest pain 11/04/2016    Past Surgical History:  Procedure Laterality Date  . CHOLECYSTECTOMY      OB History    No data available       Home Medications    Prior to Admission medications   Medication Sig Start Date End Date Taking? Authorizing Provider  dicyclomine (BENTYL) 20 MG tablet Take 1 tablet (20 mg total) by mouth 2 (two) times daily. 04/13/17  Yes Bing NeighborsHarris, Kimberly S, FNP  gabapentin (NEURONTIN) 300 MG capsule Take 1 capsule (300 mg total) by mouth 3 (three) times daily. 05/31/17  Yes Bing NeighborsHarris, Kimberly S, FNP  haloperidol decanoate (HALDOL DECANOATE) 50 MG/ML injection Inject 50 mg into the muscle every 28 (twenty-eight) days.   Yes [provider]  hydrocortisone (ANUSOL-HC) 25 MG suppository Place 1 suppository (25 mg total) rectally 2 (two) times daily. For 7 days 06/09/17  Yes Mancel BaleWentz, Elliott, MD  cyclobenzaprine (FLEXERIL) 10 MG tablet Take 1 tablet (10 mg total) by mouth 2 (two) times daily as needed for muscle spasms. 05/31/17   Bing NeighborsHarris,  Kimberly S, FNP  dicyclomine (BENTYL) 20 MG tablet Take 1 tablet (20 mg total) by mouth 2 (two) times daily. Patient not taking: Reported on 06/16/2017 04/13/17   Bing NeighborsHarris, Kimberly S, FNP  hydrocortisone cream 1 % Apply 1 application topically 2 (two) times daily. Do not apply to face Patient not taking: Reported on 06/16/2017 04/12/17   Arthor CaptainHarris, Abigail, PA-C  methocarbamol (ROBAXIN) 500 MG tablet Take 1 tablet (500 mg total) by mouth 2 (two) times daily. Patient not taking: Reported on 06/16/2017 04/13/17   Bing NeighborsHarris, Kimberly S, FNP  naproxen (NAPROSYN) 375 MG tablet Take 1 tablet (375 mg total) by mouth 2 (two) times daily. Patient not taking: Reported on 06/16/2017 04/10/17   Maxwell CaulLayden, Lindsey A, PA-C  omeprazole (PRILOSEC) 40 MG capsule Take 1 capsule (40 mg total) by mouth daily. Patient not taking: Reported on 06/16/2017 03/09/17   Bing NeighborsHarris, Kimberly S, FNP  ondansetron (ZOFRAN-ODT) 4 MG disintegrating tablet Take 1 tablet (4 mg total) by mouth every 8 (eight) hours as needed for nausea. Patient not taking: Reported on 06/16/2017 03/02/17   Bing NeighborsHarris, Kimberly S, FNP  potassium chloride (K-DUR) 10 MEQ tablet Take 1 tablet (10 mEq total) by mouth daily. Patient not taking: Reported on 06/16/2017 03/02/17   Bing NeighborsHarris, Kimberly S, FNP    Family History No family history on file.  Social History Social History  Substance Use Topics  . Smoking status: Never Smoker  . Smokeless tobacco: Never Used  .  Alcohol use No     Allergies   Patient has no known allergies.   Review of Systems Review of Systems  Constitutional: Negative for activity change.  Respiratory: Negative for shortness of breath.   Cardiovascular: Negative for chest pain.  Genitourinary: Positive for dysuria.     Physical Exam Updated Vital Signs BP 127/72 (BP Location: Left Arm)   Pulse 90   Temp 98.4 F (36.9 C) (Oral)   Resp 18   Ht 5\' 5"  (1.651 m)   Wt 71.7 kg (158 lb)   SpO2 96%   BMI 26.29 kg/m   Physical Exam    Constitutional: She is oriented to person, place, and time. She appears well-developed and well-nourished.  HENT:  Head: Normocephalic and atraumatic.  Eyes: Right eye exhibits no discharge. Left eye exhibits no discharge.  Cardiovascular: Normal rate, regular rhythm and normal heart sounds.   No murmur heard. Pulmonary/Chest: Effort normal and breath sounds normal. She has no wheezes. She has no rales.  Abdominal: Soft. She exhibits no distension. There is no tenderness.  Genitourinary: Vagina normal.  Genitourinary Comments: No discharge. Some evidence of vaginal dryness.  Neurological: She is oriented to person, place, and time.  Skin: Skin is warm and dry. She is not diaphoretic.  Psychiatric: She has a normal mood and affect.  Nursing note and vitals reviewed.    ED Treatments / Results  Labs (all labs ordered are listed, but only abnormal results are displayed) Labs Reviewed  WET PREP, GENITAL - Abnormal; Notable for the following:       Result Value   WBC, Wet Prep HPF POC MANY (*)    All other components within normal limits  URINALYSIS, ROUTINE W REFLEX MICROSCOPIC  GC/CHLAMYDIA PROBE AMP ([place]) NOT AT [facility]    EKG  EKG Interpretation None       Radiology No results found.  Procedures Procedures (including critical care time)  Medications Ordered in ED Medications - No data to display   Initial Impression / Assessment and Plan / ED Course  I have reviewed the triage vital signs and the nursing notes.  Pertinent labs & imaging results that were available during my care of the patient were reviewed by me and considered in my medical decision making (see chart for details).    Completed vaginal exam on patient and sent for wet prep. Suspect that his lites likely atrophic vaginitis. We'll send urine.  UA is positive. Will treat for both atropic vaginitis and UTI.   Appears well.  Final Clinical Impressions(s) / ED Diagnoses   Final diagnoses:   None    New Prescriptions New Prescriptions   No medications on file     Abelino DerrickMackuen, Karenann Mcgrory Lyn, MD 06/16/17 2305

## 2017-06-17 LAB — GC/CHLAMYDIA PROBE AMP (~~LOC~~) NOT AT ARMC
Chlamydia: NEGATIVE
Neisseria Gonorrhea: NEGATIVE

## 2017-06-19 ENCOUNTER — Encounter (HOSPITAL_COMMUNITY): Payer: Self-pay | Admitting: Emergency Medicine

## 2017-06-19 ENCOUNTER — Emergency Department (HOSPITAL_COMMUNITY)
Admission: EM | Admit: 2017-06-19 | Discharge: 2017-06-19 | Disposition: A | Discharge status: Home / Self Care | Payer: Medicaid Other | Attending: Emergency Medicine | Admitting: Emergency Medicine

## 2017-06-19 ENCOUNTER — Emergency Department (HOSPITAL_COMMUNITY)
Admission: EM | Admit: 2017-06-19 | Discharge: 2017-06-19 | Disposition: A | Discharge status: Home / Self Care | Payer: Medicaid Other | Source: Home / Self Care | Attending: Emergency Medicine | Admitting: Emergency Medicine

## 2017-06-19 DIAGNOSIS — M542 Cervicalgia: Secondary | ICD-10-CM | POA: Diagnosis present

## 2017-06-19 MED ORDER — CYCLOBENZAPRINE HCL 10 MG PO TABS
10.0000 mg | ORAL_TABLET | Freq: Once | ORAL | Status: AC
  Administered 2017-06-19: 10 mg via ORAL
  Filled 2017-06-19: qty 1

## 2017-06-19 NOTE — ED Triage Notes (Signed)
Pt states she has neck problems and she has been working around the house and now she has pain in both her arms and shoulders  Pt states it is a burning sore pain

## 2017-06-19 NOTE — ED Provider Notes (Signed)
[facility]-EMERGENCY DEPT Provider Note   CSN: [id number] Arrival date & time: 06/19/17  [id number]     History   Chief Complaint Chief Complaint  Patient presents with  . Neck Pain  . Generalized Body Aches    HPI Felicia Collier is a 61 y.o. female.  The history is provided by the patient and medical records. No language interpreter was used.  Neck Pain     Felicia Collier is a 61 y.o. female  with a PMH of chronic pain, schizophrenia who presents to the Emergency Department complaining of burning right-sided neck pain and bilateral burning lower extremity pain which began yesterday. Patient states that she has had this pain before several times. Seen by PCP who started her on Gabapentin for same. She was recently seen in ER and started on ABX for UTI. Patient states that she started taking antibiotic, therefore she quit taking her gabapentin. She notes that she does not like to take more than one medication at a time and thought the antibiotic was more important. The day after she stopped taking gabapentin, her burning pain started. No fever, chills, numbness, tingling, weakness. No difficulty ambulating.   Past Medical History:  Diagnosis Date  . Chronic knee pain   . Schizophrenia [facility](HCC)     Patient Active Problem List   Diagnosis Date Noted  . Schizophrenia (HCC) 05/01/2017  . Chronic pain of left knee 02/09/2017  . Unilateral primary osteoarthritis, left knee 02/09/2017  . Neuropathy 11/22/2016  . Gastroesophageal reflux disease without esophagitis 11/22/2016  . Weight gain 11/22/2016  . Atypical chest pain 11/04/2016    Past Surgical History:  Procedure Laterality Date  . CHOLECYSTECTOMY      OB History    No data available       Home Medications    Prior to Admission medications   Medication Sig Start Date End Date Taking? Authorizing Provider  cephALEXin (KEFLEX) 500 MG capsule Take 1 capsule (500 mg total) by mouth 4 (four) times daily. 06/16/17   Mackuen, Courteney Lyn,  MD  cyclobenzaprine (FLEXERIL) 10 MG tablet Take 1 tablet (10 mg total) by mouth 2 (two) times daily as needed for muscle spasms. 05/31/17   Bing NeighborsHarris, Kimberly S, FNP  dicyclomine (BENTYL) 20 MG tablet Take 1 tablet (20 mg total) by mouth 2 (two) times daily. 04/13/17   Bing NeighborsHarris, Kimberly S, FNP  dicyclomine (BENTYL) 20 MG tablet Take 1 tablet (20 mg total) by mouth 2 (two) times daily. Patient not taking: Reported on 06/16/2017 04/13/17   Bing NeighborsHarris, Kimberly S, FNP  Estradiol (ESTROGEL) 0.75 MG/1.25 GM (0.06%) topical gel Place 1.25 g onto the skin daily. 06/16/17   Mackuen, Courteney Lyn, MD  gabapentin (NEURONTIN) 300 MG capsule Take 1 capsule (300 mg total) by mouth 3 (three) times daily. 05/31/17   Bing NeighborsHarris, Kimberly S, FNP  haloperidol decanoate (HALDOL DECANOATE) 50 MG/ML injection Inject 50 mg into the muscle every 28 (twenty-eight) days.    [provider]  hydrocortisone (ANUSOL-HC) 25 MG suppository Place 1 suppository (25 mg total) rectally 2 (two) times daily. For 7 days 06/09/17   Mancel BaleWentz, Elliott, MD  hydrocortisone cream 1 % Apply 1 application topically 2 (two) times daily. Do not apply to face Patient not taking: Reported on 06/16/2017 04/12/17   Arthor CaptainHarris, Abigail, PA-C  methocarbamol (ROBAXIN) 500 MG tablet Take 1 tablet (500 mg total) by mouth 2 (two) times daily. Patient not taking: Reported on 06/16/2017 04/13/17   Bing NeighborsHarris, Kimberly S, FNP  naproxen (NAPROSYN) 375  MG tablet Take 1 tablet (375 mg total) by mouth 2 (two) times daily. Patient not taking: Reported on 06/16/2017 04/10/17   Maxwell CaulLayden, Lindsey A, PA-C  omeprazole (PRILOSEC) 40 MG capsule Take 1 capsule (40 mg total) by mouth daily. Patient not taking: Reported on 06/16/2017 03/09/17   Bing NeighborsHarris, Kimberly S, FNP  ondansetron (ZOFRAN-ODT) 4 MG disintegrating tablet Take 1 tablet (4 mg total) by mouth every 8 (eight) hours as needed for nausea. Patient not taking: Reported on 06/16/2017 03/02/17   Bing NeighborsHarris, Kimberly S, FNP  potassium chloride (K-DUR) 10  MEQ tablet Take 1 tablet (10 mEq total) by mouth daily. Patient not taking: Reported on 06/16/2017 03/02/17   Bing NeighborsHarris, Kimberly S, FNP    Family History History reviewed. No pertinent family history.  Social History Social History  Substance Use Topics  . Smoking status: Never Smoker  . Smokeless tobacco: Never Used  . Alcohol use No     Allergies   Patient has no known allergies.   Review of Systems Review of Systems  Musculoskeletal: Positive for myalgias and neck pain.  All other systems reviewed and are negative.    Physical Exam Updated Vital Signs BP 132/86 (BP Location: Left Arm)   Pulse 87   Temp 98.2 F (36.8 C) (Oral)   Resp 20   SpO2 100%   Physical Exam  Constitutional: She is oriented to person, place, and time. She appears well-developed and well-nourished. No distress.  HENT:  Head: Normocephalic and atraumatic.  Neck:  No midline or paraspinal tenderness. Full ROM without pain. Unable to reproduce pain.  Cardiovascular: Normal rate, regular rhythm and normal heart sounds.   No murmur heard. Pulmonary/Chest: Effort normal and breath sounds normal. No respiratory distress.  Abdominal: Soft. She exhibits no distension. There is no tenderness.  Musculoskeletal:  No T/L spine tenderness. No tenderness to LE's. Full ROM of back and all four extremities. Ambulatory with steady gait.  Neurological: She is alert and oriented to person, place, and time.  Bilateral lower extremities neurovascularly intact.  Skin: Skin is warm and dry.  Nursing note and vitals reviewed.   ED Treatments / Results  Labs (all labs ordered are listed, but only abnormal results are displayed) Labs Reviewed - No data to display  EKG  EKG Interpretation None       Radiology No results found.  Procedures Procedures (including critical care time)  Medications Ordered in ED Medications  cyclobenzaprine (FLEXERIL) tablet 10 mg (not administered)     Initial  Impression / Assessment and Plan / ED Course  I have reviewed the triage vital signs and the nursing notes.  Pertinent labs & imaging results that were available during my care of the patient were reviewed by me and considered in my medical decision making (see chart for details).     Felicia Collier is a 61 y.o. female who presents to ED for burning neck and lower extremity pain which began after she decided to stop taking gabapentin. Exam reassuring. Encouraged patient to take gabapentin and follow up with PCP. Return for new/worsening symptoms. All questions answered.    Final Clinical Impressions(s) / ED Diagnoses   Final diagnoses:  Neck pain    New Prescriptions New Prescriptions   No medications on file     Ward, Chase PicketJaime Pilcher, PA-C 06/19/17 [id number]    Linwood DibblesKnapp, Jon, MD 06/21/17 1029

## 2017-06-19 NOTE — Discharge Instructions (Signed)
It was my pleasure taking care of you today!   Continue taking your antibiotic until completion.  You should also be taking your Gabapentin three times daily as directed by your primary care doctor.  I also want you to follow up with your primary care doctor for discussion of today's ER visit.  Return to ER for new or worsening symptoms, any additional concerns.

## 2017-06-19 NOTE — ED Notes (Signed)
Pt states she is not waiting any longer

## 2017-06-19 NOTE — ED Triage Notes (Signed)
Pt c/o neck pain/ burning and states she also has generalized body aches. Pt was triaged at approx 1am this morning but left to go home and take bath and sleep and returned this morning for similar symptoms. Pt has been taking antibiotics as directed from prior admission but has not taken any pain meds.

## 2017-06-20 ENCOUNTER — Telehealth: Payer: Self-pay

## 2017-06-21 NOTE — Telephone Encounter (Signed)
Tried to contact patient several times was unable to reach

## 2017-06-24 ENCOUNTER — Ambulatory Visit (INDEPENDENT_AMBULATORY_CARE_PROVIDER_SITE_OTHER): Payer: Medicaid Other | Admitting: Family Medicine

## 2017-06-24 ENCOUNTER — Encounter: Payer: Self-pay | Admitting: Family Medicine

## 2017-06-24 VITALS — BP 132/64 | HR 69 | Temp 98.2°F | Resp 14 | Ht 65.0 in | Wt 163.4 lb

## 2017-06-24 DIAGNOSIS — K649 Unspecified hemorrhoids: Secondary | ICD-10-CM

## 2017-06-24 DIAGNOSIS — R8299 Other abnormal findings in urine: Secondary | ICD-10-CM | POA: Diagnosis not present

## 2017-06-24 DIAGNOSIS — R5383 Other fatigue: Secondary | ICD-10-CM | POA: Diagnosis not present

## 2017-06-24 DIAGNOSIS — G894 Chronic pain syndrome: Secondary | ICD-10-CM | POA: Diagnosis not present

## 2017-06-24 DIAGNOSIS — R82998 Other abnormal findings in urine: Secondary | ICD-10-CM

## 2017-06-24 LAB — CBC WITH DIFFERENTIAL/PLATELET
BASOS ABS: 0 {cells}/uL (ref 0–200)
Basophils Relative: 0 %
EOS ABS: 178 {cells}/uL (ref 15–500)
EOS PCT: 2 %
HCT: 40.2 % (ref 35.0–45.0)
Hemoglobin: 12.5 g/dL (ref 11.7–15.5)
LYMPHS PCT: 22 %
Lymphs Abs: 1958 cells/uL (ref 850–3900)
MCH: 25.7 pg — AB (ref 27.0–33.0)
MCHC: 31.1 g/dL — ABNORMAL LOW (ref 32.0–36.0)
MCV: 82.5 fL (ref 80.0–100.0)
MONOS PCT: 7 %
MPV: 10.7 fL (ref 7.5–12.5)
Monocytes Absolute: 623 cells/uL (ref 200–950)
NEUTROS ABS: 6141 {cells}/uL (ref 1500–7800)
Neutrophils Relative %: 69 %
PLATELETS: 307 10*3/uL (ref 140–400)
RBC: 4.87 MIL/uL (ref 3.80–5.10)
RDW: 14.5 % (ref 11.0–15.0)
WBC: 8.9 10*3/uL (ref 3.8–10.8)

## 2017-06-24 LAB — POCT URINALYSIS DIP (DEVICE)
Bilirubin Urine: NEGATIVE
GLUCOSE, UA: NEGATIVE mg/dL
Hgb urine dipstick: NEGATIVE
Ketones, ur: NEGATIVE mg/dL
NITRITE: NEGATIVE
PROTEIN: NEGATIVE mg/dL
Specific Gravity, Urine: 1.01 (ref 1.005–1.030)
UROBILINOGEN UA: 0.2 mg/dL (ref 0.0–1.0)
pH: 7 (ref 5.0–8.0)

## 2017-06-24 LAB — COMPLETE METABOLIC PANEL WITH GFR
ALT: 15 U/L (ref 6–29)
AST: 16 U/L (ref 10–35)
Albumin: 3.9 g/dL (ref 3.6–5.1)
Alkaline Phosphatase: 102 U/L (ref 33–130)
BUN: 7 mg/dL (ref 7–25)
CHLORIDE: 102 mmol/L (ref 98–110)
CO2: 28 mmol/L (ref 20–32)
Calcium: 9.2 mg/dL (ref 8.6–10.4)
Creat: 0.69 mg/dL (ref 0.50–0.99)
GLUCOSE: 81 mg/dL (ref 65–99)
POTASSIUM: 3.8 mmol/L (ref 3.5–5.3)
SODIUM: 139 mmol/L (ref 135–146)
TOTAL PROTEIN: 6.7 g/dL (ref 6.1–8.1)
Total Bilirubin: 0.5 mg/dL (ref 0.2–1.2)

## 2017-06-24 MED ORDER — HYDROCORTISONE 2.5 % RE CREA: 1.0000 "application " | TOPICAL_CREAM | Freq: Two times a day (BID) | RECTAL | 2 refills | Status: DC

## 2017-06-24 NOTE — Patient Instructions (Signed)
Anal Stricture  Anal stricture, also called anal stenosis, is a narrowing of the opening between the buttocks (anus). This condition makes bowel movements difficult or painful. Anal stricture can range from mild to severe.  What are the causes?  This condition may be caused by:   Surgery involving the anal canal, such as hemorrhoidectomy.   Abscesses or infections.   Inflammatory bowel disease (IBD), such as Crohn disease and ulcerative colitis.   Injury to the anus.   A tear or crack in the skin around the anus (anal fissure).   Radiation therapy.   An STD (sexually transmitted disease).   Tuberculosis.   Overuse of medicines that help you have a bowel movement (laxatives).    What are the signs or symptoms?  Symptoms of this condition include:   Pain and pressure during bowel movements.   Bleeding during bowel movements.   Trouble getting stool out.   Constipation.   Discomfort in the anus.   Narrow stools.    How is this diagnosed?  This condition may be diagnosed based on:   Your medical history.   An exam of your anal canal. During the exam, your health care provider will feel the inside of your rectum.   A test called an anorectal manometry. During this test a small balloon attached to a flexible tube is used to check how your anus is working.    How is this treated?  Treatment for this condition depends on the cause and severity of the condition. For mild to moderate stricture, nonsurgical treatments are usually successful. Treatments for this condition may involve:   Diet changes. You may need to drink more fluid, eat more high-fiber foods, or take fiber supplements.   Stool softeners.   Techniques to stretch the opening of the anus (dilation techniques). These may be performed by a health care provider, or you may need to do them at home.   Surgery. This may be done in severe cases or if other treatments do not help.Surgerymay involve:  ? Sphincterotomy. This is a surgery on a muscle  in the anus.  ? Anoplasty. This is a surgery to reconstruct the anus.    Follow these instructions at home:  Diet     Make any diet changes that your health care provider recommends.   Eat foods that are high in fiber, such as fruits, vegetables, whole grains, and beans.   Drink enough fluid to keep your urine clear or pale yellow.  General instructions   Take over-the-counter and prescription medicines only as told by your health care provider.   Practice good hygiene to reduce your risk of infection.   Follow instructions from your health care provider about how and when to perform any dilation techniques.   Keep all follow-up visits as told by your health care provider. This is important.  Contact a health care provider if:   You have more pain or bleeding during bowel movements, even after treatment.   You have trouble having a bowel movement.   You are unable to have a bowel movement.  Get help right away if:   You have a fever and your symptoms suddenly get worse.   You have black stools.  This information is not intended to replace advice given to you by your health care provider. Make sure you discuss any questions you have with your health care provider.  Document Released: 02/26/2013 Document Revised: 06/28/2016 Document Reviewed: 05/04/2016  Elsevier Interactive Patient Education    2017 [employer]

## 2017-06-24 NOTE — Progress Notes (Signed)
Patient ID: Felicia Collier, female    DOB: June 27, 1956, 61 y.o.   MRN: [id number]  PCP: Bing NeighborsHarris, Aianna Fahs S, FNP  Chief Complaint  Patient presents with  . Hospitalization Follow-up    RECTAL BLEEDING    Subjective:  HPI Felicia LatheSheila Grzesiak is a 61 y.o. female presents for evaluation of hospital follow-up.  Medical problems include: Chronic Pain, Schizophrenia, GERD [name] has presented to the emergency department 21 times within the last 6 months with various acute non-emergent complaints. She presents today for hospital follow-up regarding her most recent visits which included: Rectal bleeding related to external hemorrhoids, vaginitis, and neck pain. She'll reports resolution of the vaginal itching and continues to complain of neck pain. She is prescribed naproxen, Robaxin, and gabapentin for chronic pain however reports that someone took her medication. [name] later reports that she takes the medication until the pain is relieved and has thrown the Gabapentin, Naproxen, and Robaxin in the garbage. She reports that she is occasionally noticing blood on the toilet paper when she wipes after bowel movements. She was previously prescribed suppositories and reports using all of the medication. Silvio PateShelia reports that she [facility] and in which she will start receiving Haldol injections.    Social History   Social History  . Marital status: Single    Spouse name: N/A  . Number of children: N/A  . Years of education: N/A   Occupational History  . Not on file.   Social History Main Topics  . Smoking status: Never Smoker  . Smokeless tobacco: Never Used  . Alcohol use No  . Drug use: No  . Sexual activity: Not on file   Other Topics Concern  . Not on file   Social History Narrative  . No narrative on file    Family History  Problem Relation Age of Onset  . Family history unknown: Yes   Review of Systems See HPI  Patient Active Problem List   Diagnosis  Date Noted  . Schizophrenia (HCC) 05/01/2017  . Chronic pain of left knee 02/09/2017  . Unilateral primary osteoarthritis, left knee 02/09/2017  . Neuropathy 11/22/2016  . Gastroesophageal reflux disease without esophagitis 11/22/2016  . Weight gain 11/22/2016  . Atypical chest pain 11/04/2016    No Known Allergies  Prior to Admission medications   Medication Sig Start Date End Date Taking? Authorizing Provider  cephALEXin (KEFLEX) 500 MG capsule Take 1 capsule (500 mg total) by mouth 4 (four) times daily. Patient not taking: Reported on 06/24/2017 06/16/17   Mackuen, Cindee Saltourteney Lyn, MD  cyclobenzaprine (FLEXERIL) 10 MG tablet Take 1 tablet (10 mg total) by mouth 2 (two) times daily as needed for muscle spasms. Patient not taking: Reported on 06/24/2017 05/31/17   Bing NeighborsHarris, Syniyah Bourne S, FNP  dicyclomine (BENTYL) 20 MG tablet Take 1 tablet (20 mg total) by mouth 2 (two) times daily. Patient not taking: Reported on 06/24/2017 04/13/17   Bing NeighborsHarris, Jodeci Roarty S, FNP  dicyclomine (BENTYL) 20 MG tablet Take 1 tablet (20 mg total) by mouth 2 (two) times daily. Patient not taking: Reported on 06/16/2017 04/13/17   Bing NeighborsHarris, Rebekah Sprinkle S, FNP  Estradiol (ESTROGEL) 0.75 MG/1.25 GM (0.06%) topical gel Place 1.25 g onto the skin daily. Patient not taking: Reported on 06/24/2017 06/16/17   Mackuen, Cindee Saltourteney Lyn, MD  gabapentin (NEURONTIN) 300 MG capsule Take 1 capsule (300 mg total) by mouth 3 (three) times daily. Patient not taking: Reported on 06/24/2017 05/31/17   Bing NeighborsHarris, Jamesmichael Shadd S, FNP  haloperidol decanoate (HALDOL DECANOATE) 50 MG/ML injection Inject 50 mg into the muscle every 28 (twenty-eight) days.    [provider]  hydrocortisone (ANUSOL-HC) 25 MG suppository Place 1 suppository (25 mg total) rectally 2 (two) times daily. For 7 days Patient not taking: Reported on 06/24/2017 06/09/17   Mancel Bale, MD  hydrocortisone cream 1 % Apply 1 application topically 2 (two) times daily. Do not apply to  face Patient not taking: Reported on 06/16/2017 04/12/17   Arthor Captain, PA-C  methocarbamol (ROBAXIN) 500 MG tablet Take 1 tablet (500 mg total) by mouth 2 (two) times daily. Patient not taking: Reported on 06/16/2017 04/13/17   Bing Neighbors, FNP  naproxen (NAPROSYN) 375 MG tablet Take 1 tablet (375 mg total) by mouth 2 (two) times daily. Patient not taking: Reported on 06/16/2017 04/10/17   Maxwell Caul, PA-C  omeprazole (PRILOSEC) 40 MG capsule Take 1 capsule (40 mg total) by mouth daily. Patient not taking: Reported on 06/16/2017 03/09/17   Bing Neighbors, FNP  ondansetron (ZOFRAN-ODT) 4 MG disintegrating tablet Take 1 tablet (4 mg total) by mouth every 8 (eight) hours as needed for nausea. Patient not taking: Reported on 06/16/2017 03/02/17   Bing Neighbors, FNP  potassium chloride (K-DUR) 10 MEQ tablet Take 1 tablet (10 mEq total) by mouth daily. Patient not taking: Reported on 06/16/2017 03/02/17   Bing Neighbors, FNP    Past Medical, Surgical Family and Social History reviewed and updated.    Objective:   Today's Vitals   06/24/17 0929  BP: 132/64  Pulse: 69  Resp: 14  Temp: 98.2 F (36.8 C)  TempSrc: Oral  SpO2: 99%  Weight: 163 lb 6.4 oz (74.1 kg)  Height: 5\' 5"  (1.651 m)    Wt Readings from Last 3 Encounters:  06/24/17 163 lb 6.4 oz (74.1 kg)  06/19/17 158 lb (71.7 kg)  06/16/17 158 lb (71.7 kg)   Physical Exam  Constitutional: She is oriented to person, place, and time. She appears well-developed and well-nourished.  HENT:  Head: Normocephalic and atraumatic.  Cardiovascular: Normal rate, regular rhythm, normal heart sounds and intact distal pulses.   Pulmonary/Chest: Effort normal and breath sounds normal.  Neurological: She is alert and oriented to person, place, and time.  Skin: Skin is warm and dry.  Psychiatric: She has a normal mood and affect. Her behavior is normal. Judgment and thought content normal.   Assessment & Plan:  1. Hemorrhoids,  unspecified hemorrhoid type, increase water intake, green leafy vegetables, apples, and or raisins to soften stool and increase fiber intake.  2. Other fatigue, screening for anemia  3. Chronic pain syndrome, recommended OTC Tylenol and heat application to neck and knee   Meds ordered this encounter  Medications  . hydrocortisone (ANUSOL-HC) 2.5 % rectal cream    Sig: Place 1 application rectally 2 (two) times daily.    Dispense:  60 g    Refill:  2    Order Specific Question:   Supervising Provider    Answer:   Quentin Angst [[id number]]    Orders Placed This Encounter: -CBC-differential  -CMP -Urine Culture   RTC: Follow-up 3 months for chronic conditions management  Godfrey Pick. Tiburcio Pea, MSN, FNP-C [facility]  [address] [place], [place] [address] [phone]

## 2017-06-25 LAB — URINE CULTURE

## 2017-06-29 ENCOUNTER — Encounter (HOSPITAL_COMMUNITY): Payer: Self-pay

## 2017-06-29 ENCOUNTER — Emergency Department (HOSPITAL_COMMUNITY)
Admission: EM | Admit: 2017-06-29 | Discharge: 2017-06-29 | Disposition: A | Discharge status: Home / Self Care | Payer: Medicaid Other | Attending: Emergency Medicine | Admitting: Emergency Medicine

## 2017-06-29 ENCOUNTER — Emergency Department (HOSPITAL_COMMUNITY): Payer: Medicaid Other

## 2017-06-29 DIAGNOSIS — R5383 Other fatigue: Secondary | ICD-10-CM | POA: Diagnosis not present

## 2017-06-29 DIAGNOSIS — R110 Nausea: Secondary | ICD-10-CM | POA: Insufficient documentation

## 2017-06-29 DIAGNOSIS — K2900 Acute gastritis without bleeding: Secondary | ICD-10-CM | POA: Diagnosis not present

## 2017-06-29 DIAGNOSIS — N390 Urinary tract infection, site not specified: Secondary | ICD-10-CM | POA: Diagnosis not present

## 2017-06-29 DIAGNOSIS — R1011 Right upper quadrant pain: Secondary | ICD-10-CM | POA: Diagnosis not present

## 2017-06-29 DIAGNOSIS — Z79899 Other long term (current) drug therapy: Secondary | ICD-10-CM | POA: Insufficient documentation

## 2017-06-29 DIAGNOSIS — R350 Frequency of micturition: Secondary | ICD-10-CM | POA: Diagnosis not present

## 2017-06-29 DIAGNOSIS — R1013 Epigastric pain: Secondary | ICD-10-CM | POA: Diagnosis present

## 2017-06-29 LAB — CBC WITH DIFFERENTIAL/PLATELET
BASOS ABS: 0 10*3/uL (ref 0.0–0.1)
Basophils Relative: 0 %
EOS ABS: 0.1 10*3/uL (ref 0.0–0.7)
Eosinophils Relative: 1 %
HCT: 39.1 % (ref 36.0–46.0)
HEMOGLOBIN: 12.7 g/dL (ref 12.0–15.0)
LYMPHS ABS: 1.7 10*3/uL (ref 0.7–4.0)
LYMPHS PCT: 17 %
MCH: 26 pg (ref 26.0–34.0)
MCHC: 32.5 g/dL (ref 30.0–36.0)
MCV: 80 fL (ref 78.0–100.0)
Monocytes Absolute: 0.6 10*3/uL (ref 0.1–1.0)
Monocytes Relative: 6 %
NEUTROS PCT: 76 %
Neutro Abs: 7.6 10*3/uL (ref 1.7–7.7)
PLATELETS: 285 10*3/uL (ref 150–400)
RBC: 4.89 MIL/uL (ref 3.87–5.11)
RDW: 14 % (ref 11.5–15.5)
WBC: 10 10*3/uL (ref 4.0–10.5)

## 2017-06-29 LAB — URINALYSIS, ROUTINE W REFLEX MICROSCOPIC
BILIRUBIN URINE: NEGATIVE
Glucose, UA: NEGATIVE mg/dL
HGB URINE DIPSTICK: NEGATIVE
KETONES UR: NEGATIVE mg/dL
Nitrite: NEGATIVE
PROTEIN: NEGATIVE mg/dL
Specific Gravity, Urine: 1.015 (ref 1.005–1.030)
pH: 7 (ref 5.0–8.0)

## 2017-06-29 LAB — COMPREHENSIVE METABOLIC PANEL
ALK PHOS: 91 U/L (ref 38–126)
ALT: 15 U/L (ref 14–54)
AST: 27 U/L (ref 15–41)
Albumin: 3.9 g/dL (ref 3.5–5.0)
Anion gap: 7 (ref 5–15)
BUN: 9 mg/dL (ref 6–20)
CALCIUM: 8.6 mg/dL — AB (ref 8.9–10.3)
CHLORIDE: 105 mmol/L (ref 101–111)
CO2: 28 mmol/L (ref 22–32)
CREATININE: 0.89 mg/dL (ref 0.44–1.00)
GFR calc non Af Amer: >60 mL/min (ref >60)
GLUCOSE: 90 mg/dL (ref 65–99)
Potassium: 3.9 mmol/L (ref 3.5–5.1)
SODIUM: 140 mmol/L (ref 135–145)
Total Bilirubin: 0.5 mg/dL (ref 0.3–1.2)
Total Protein: 7.4 g/dL (ref 6.5–8.1)

## 2017-06-29 LAB — LIPASE, BLOOD: Lipase: 37 U/L (ref 11–51)

## 2017-06-29 MED ORDER — ONDANSETRON 4 MG PO TBDP: 4.0000 mg | ORAL_TABLET | Freq: Three times a day (TID) | ORAL | 0 refills | Status: DC | PRN

## 2017-06-29 MED ORDER — CEPHALEXIN 500 MG PO CAPS: 500.0000 mg | ORAL_CAPSULE | Freq: Three times a day (TID) | ORAL | 0 refills | Status: AC

## 2017-06-29 MED ORDER — MORPHINE SULFATE (PF) 2 MG/ML IV SOLN
4.0000 mg | Freq: Once | INTRAVENOUS | Status: AC
  Administered 2017-06-29: 4 mg via INTRAVENOUS
  Filled 2017-06-29: qty 2

## 2017-06-29 MED ORDER — FAMOTIDINE IN NACL 20-0.9 MG/50ML-% IV SOLN
20.0000 mg | Freq: Once | INTRAVENOUS | Status: AC
  Administered 2017-06-29: 20 mg via INTRAVENOUS
  Filled 2017-06-29: qty 50

## 2017-06-29 MED ORDER — GI COCKTAIL ~~LOC~~
30.0000 mL | Freq: Once | ORAL | Status: AC
  Administered 2017-06-29: 30 mL via ORAL
  Filled 2017-06-29: qty 30

## 2017-06-29 MED ORDER — SUCRALFATE 1 GM/10ML PO SUSP: 1.0000 g | Freq: Three times a day (TID) | ORAL | 0 refills | Status: DC

## 2017-06-29 MED ORDER — PANTOPRAZOLE SODIUM 20 MG PO TBEC: 20.0000 mg | DELAYED_RELEASE_TABLET | Freq: Every day | ORAL | 0 refills | Status: DC

## 2017-06-29 MED ORDER — DEXTROSE 5 % IV SOLN
1.0000 g | Freq: Once | INTRAVENOUS | Status: AC
  Administered 2017-06-29: 1 g via INTRAVENOUS
  Filled 2017-06-29: qty 10

## 2017-06-29 NOTE — ED Triage Notes (Signed)
Pt complains of upper abdominal pain all weekend No vomiting but some diarrhea

## 2017-06-29 NOTE — ED Provider Notes (Signed)
[facility]-EMERGENCY DEPT Provider Note   CSN: [id number] Arrival date & time: 06/29/17  4098     History   Chief Complaint Chief Complaint  Patient presents with  . Abdominal Pain    HPI Felicia Collier is a 61 y.o. female.  HPI   61 yo F with h/o schizophrenia, GERD here with epigastric abd pain. Pt states that she has had intermittent epigastric pain for years. She just received a haldol dec shot and feels that it has worsened her pain. She describes an aching, burning pain in her epigastric area that worsens with eating and with lying flat. Pain worsens shortly after eating. She has nausea but no vomiting. No diarrhea. No fevers. No alleviating factors.  Past Medical History:  Diagnosis Date  . Chronic knee pain   . Schizophrenia [facility])     Patient Active Problem List   Diagnosis Date Noted  . Schizophrenia (HCC) 05/01/2017  . Chronic pain of left knee 02/09/2017  . Unilateral primary osteoarthritis, left knee 02/09/2017  . Neuropathy 11/22/2016  . Gastroesophageal reflux disease without esophagitis 11/22/2016  . Weight gain 11/22/2016  . Atypical chest pain 11/04/2016    Past Surgical History:  Procedure Laterality Date  . CHOLECYSTECTOMY      OB History    No data available       Home Medications    Prior to Admission medications   Medication Sig Start Date End Date Taking? Authorizing Provider  gabapentin (NEURONTIN) 100 MG capsule Take 100 mg by mouth 3 (three) times daily.   Yes [provider]  gabapentin (NEURONTIN) 300 MG capsule Take 1 capsule (300 mg total) by mouth 3 (three) times daily. 05/31/17  Yes Bing Neighbors, FNP  haloperidol decanoate (HALDOL DECANOATE) 50 MG/ML injection Inject 50 mg into the muscle every 28 (twenty-eight) days.   Yes [provider]  potassium chloride (K-DUR) 10 MEQ tablet Take 1 tablet (10 mEq total) by mouth daily. 03/02/17  Yes Bing Neighbors, FNP  cephALEXin (KEFLEX) 500 MG capsule Take 1 capsule  (500 mg total) by mouth 3 (three) times daily. 06/29/17 07/06/17  Shaune Pollack, MD  cyclobenzaprine (FLEXERIL) 10 MG tablet Take 1 tablet (10 mg total) by mouth 2 (two) times daily as needed for muscle spasms. 05/31/17   Bing Neighbors, FNP  dicyclomine (BENTYL) 20 MG tablet Take 1 tablet (20 mg total) by mouth 2 (two) times daily. Patient not taking: Reported on 06/24/2017 04/13/17   Bing Neighbors, FNP  dicyclomine (BENTYL) 20 MG tablet Take 1 tablet (20 mg total) by mouth 2 (two) times daily. Patient not taking: Reported on 06/16/2017 04/13/17   Bing Neighbors, FNP  Estradiol (ESTROGEL) 0.75 MG/1.25 GM (0.06%) topical gel Place 1.25 g onto the skin daily. 06/16/17   Mackuen, Courteney Lyn, MD  hydrocortisone (ANUSOL-HC) 2.5 % rectal cream Place 1 application rectally 2 (two) times daily. 06/24/17   Bing Neighbors, FNP  hydrocortisone (ANUSOL-HC) 25 MG suppository Place 1 suppository (25 mg total) rectally 2 (two) times daily. For 7 days Patient not taking: Reported on 06/24/2017 06/09/17   Mancel Bale, MD  hydrocortisone cream 1 % Apply 1 application topically 2 (two) times daily. Do not apply to face Patient not taking: Reported on 06/16/2017 04/12/17   Arthor Captain, PA-C  methocarbamol (ROBAXIN) 500 MG tablet Take 1 tablet (500 mg total) by mouth 2 (two) times daily. Patient not taking: Reported on 06/16/2017 04/13/17   Bing Neighbors, FNP  naproxen (NAPROSYN)  375 MG tablet Take 1 tablet (375 mg total) by mouth 2 (two) times daily. Patient not taking: Reported on 06/16/2017 04/10/17   Maxwell Caul, PA-C  omeprazole (PRILOSEC) 40 MG capsule Take 1 capsule (40 mg total) by mouth daily. Patient not taking: Reported on 06/16/2017 03/09/17   Bing Neighbors, FNP  ondansetron (ZOFRAN ODT) 4 MG disintegrating tablet Take 1 tablet (4 mg total) by mouth every 8 (eight) hours as needed for nausea or vomiting. 06/29/17   Shaune Pollack, MD  pantoprazole (PROTONIX) 20 MG tablet Take 1 tablet  (20 mg total) by mouth daily. 06/29/17 07/13/17  Shaune Pollack, MD  sucralfate (CARAFATE) 1 GM/10ML suspension Take 10 mLs (1 g total) by mouth 4 (four) times daily -  with meals and at bedtime. 06/29/17   Shaune Pollack, MD    Family History Family History  Problem Relation Age of Onset  . Family history unknown: Yes    Social History Social History  Substance Use Topics  . Smoking status: Never Smoker  . Smokeless tobacco: Never Used  . Alcohol use No     Allergies   Patient has no known allergies.   Review of Systems Review of Systems  Constitutional: Positive for fatigue. Negative for chills and fever.  HENT: Negative for congestion, rhinorrhea and sore throat.   Eyes: Negative for visual disturbance.  Respiratory: Negative for cough, shortness of breath and wheezing.   Cardiovascular: Negative for chest pain and leg swelling.  Gastrointestinal: Positive for abdominal pain and nausea. Negative for diarrhea and vomiting.  Genitourinary: Positive for frequency. Negative for dysuria, flank pain, vaginal bleeding and vaginal discharge.  Musculoskeletal: Negative for neck pain.  Skin: Negative for rash.  Allergic/Immunologic: Negative for immunocompromised state.  Neurological: Negative for syncope and headaches.  Hematological: Does not bruise/bleed easily.  All other systems reviewed and are negative.    Physical Exam Updated Vital Signs BP (!) 116/57 (BP Location: Right Wrist)   Pulse 64   Temp 98.8 F (37.1 C) (Oral)   Resp 16   Ht 5\' 5"  (1.651 m)   Wt 73 kg (161 lb)   SpO2 98%   BMI 26.79 kg/m   Physical Exam  Constitutional: She is oriented to person, place, and time. She appears well-developed and well-nourished. No distress.  HENT:  Head: Normocephalic and atraumatic.  Eyes: Conjunctivae are normal.  Neck: Neck supple.  Cardiovascular: Normal rate, regular rhythm and normal heart sounds.  Exam reveals no friction rub.   No murmur  heard. Pulmonary/Chest: Effort normal and breath sounds normal. No respiratory distress. She has no wheezes. She has no rales.  Abdominal: Soft. Bowel sounds are normal. She exhibits no distension. There is tenderness (mild, epigastric). There is no rebound and no guarding.  No CVAT bilaterally  Musculoskeletal: She exhibits no edema.  Neurological: She is alert and oriented to person, place, and time. She exhibits normal muscle tone.  Skin: Skin is warm. Capillary refill takes less than 2 seconds.  Psychiatric: She has a normal mood and affect.  Nursing note and vitals reviewed.    ED Treatments / Results  Labs (all labs ordered are listed, but only abnormal results are displayed) Labs Reviewed  COMPREHENSIVE METABOLIC PANEL - Abnormal; Notable for the following:       Result Value   Calcium 8.6 (*)    All other components within normal limits  URINALYSIS, ROUTINE W REFLEX MICROSCOPIC - Abnormal; Notable for the following:    APPearance HAZY (*)  Leukocytes, UA LARGE (*)    Bacteria, UA RARE (*)    Squamous Epithelial / LPF 0-5 (*)    Crystals PRESENT (*)    All other components within normal limits  CBC WITH DIFFERENTIAL/PLATELET  LIPASE, BLOOD    EKG  EKG Interpretation None       Radiology Kore[place] Abdomen Limited Ruq  Result Date: 06/29/2017 CLINICAL DATA:  Acute onset of right lower quadrant abdominal pain. Initial encounter. EXAM: ULTRASOUND ABDOMEN LIMITED RIGHT UPPER QUADRANT COMPARISON:  CT of the abdomen and pelvis from 03/02/2017 FINDINGS: Gallbladder: Status post cholecystectomy.  No retained stones seen. Common bile duct: Diameter: 0.5 cm, within normal limits in caliber. Liver: No focal lesion identified. Increased parenchymal echogenicity and coarsened echotexture, compatible fatty infiltration. IMPRESSION: 1. No acute abnormality seen at the right upper quadrant. 2. Status post cholecystectomy. 3. Mild diffuse fatty infiltration within the liver. Electronically  Signed   By: Roanna RaiderJeffery  Chang M.D.   On: 06/29/2017 22:14    Procedures Procedures (including critical care time)  Medications Ordered in ED Medications  cefTRIAXone (ROCEPHIN) 1 g in dextrose 5 % 50 mL IVPB (1 g Intravenous New Bag/Given 06/29/17 2230)  gi cocktail (Maalox,Lidocaine,Donnatal) (30 mLs Oral Given 06/29/17 2151)  famotidine (PEPCID) IVPB 20 mg premix (0 mg Intravenous Stopped 06/29/17 2226)  morphine 2 MG/ML injection 4 mg (4 mg Intravenous Given 06/29/17 2230)     Initial Impression / Assessment and Plan / ED Course  I have reviewed the triage vital signs and the nursing notes.  Pertinent labs & imaging results that were available during my care of the patient were reviewed by me and considered in my medical decision making (see chart for details).     61 yo F with PMHx as above here with epigastric, burning epigastric pain. Suspect acute on chronic gastritis/GERD versus PUD. Pt has well documented h/o same and admits she has not been taking her meds, and has been taking NSAIDs. No peritonitis, guarding, or s/s to suggest obstruction, perforation, or acute intra-abd emergency. RUQ U/S neg for abnormality. CBC without leukocytosis or anemia and LFTs normal. UA with +UTI, but pt has no flank pain, no signs to suggest pyelo. Will tx with antacids, keflex for UTI, and close f/u.  Final Clinical Impressions(s) / ED Diagnoses   Final diagnoses:  RUQ pain  Lower urinary tract infectious disease  Other acute gastritis without hemorrhage    New Prescriptions New Prescriptions   ONDANSETRON (ZOFRAN ODT) 4 MG DISINTEGRATING TABLET    Take 1 tablet (4 mg total) by mouth every 8 (eight) hours as needed for nausea or vomiting.   PANTOPRAZOLE (PROTONIX) 20 MG TABLET    Take 1 tablet (20 mg total) by mouth daily.   SUCRALFATE (CARAFATE) 1 GM/10ML SUSPENSION    Take 10 mLs (1 g total) by mouth 4 (four) times daily -  with meals and at bedtime.     Shaune PollackIsaacs, Denyce Harr, MD 06/29/17  2251

## 2017-07-08 DIAGNOSIS — Z5321 Procedure and treatment not carried out due to patient leaving prior to being seen by health care provider: Secondary | ICD-10-CM | POA: Diagnosis not present

## 2017-07-08 DIAGNOSIS — M545 Low back pain: Secondary | ICD-10-CM | POA: Diagnosis present

## 2017-07-09 ENCOUNTER — Encounter (HOSPITAL_COMMUNITY): Payer: Self-pay | Admitting: Nurse Practitioner

## 2017-07-09 ENCOUNTER — Emergency Department (HOSPITAL_COMMUNITY)
Admission: EM | Admit: 2017-07-09 | Discharge: 2017-07-09 | Disposition: A | Discharge status: Home / Self Care | Payer: Medicaid Other | Attending: Emergency Medicine | Admitting: Emergency Medicine

## 2017-07-09 NOTE — ED Notes (Signed)
Called for Pt in waiting room no answer

## 2017-07-09 NOTE — ED Triage Notes (Signed)
Pt is c/o lower back pain, states the pain medication prescribed last time is not helping.

## 2017-07-20 ENCOUNTER — Ambulatory Visit (INDEPENDENT_AMBULATORY_CARE_PROVIDER_SITE_OTHER): Payer: Medicaid Other | Admitting: Orthopaedic Surgery

## 2017-07-20 DIAGNOSIS — M1712 Unilateral primary osteoarthritis, left knee: Secondary | ICD-10-CM | POA: Diagnosis not present

## 2017-07-20 DIAGNOSIS — G8929 Other chronic pain: Secondary | ICD-10-CM

## 2017-07-20 DIAGNOSIS — M25532 Pain in left wrist: Secondary | ICD-10-CM | POA: Insufficient documentation

## 2017-07-20 DIAGNOSIS — M25562 Pain in left knee: Secondary | ICD-10-CM

## 2017-07-20 MED ORDER — METHYLPREDNISOLONE ACETATE 40 MG/ML IJ SUSP
40.0000 mg | INTRAMUSCULAR | Status: AC | PRN
  Administered 2017-07-20: 40 mg

## 2017-07-20 MED ORDER — LIDOCAINE HCL 1 % IJ SOLN
3.0000 mL | INTRAMUSCULAR | Status: AC | PRN
  Administered 2017-07-20: 3 mL

## 2017-07-20 MED ORDER — LIDOCAINE HCL 1 % IJ SOLN
1.0000 mL | INTRAMUSCULAR | Status: AC | PRN
  Administered 2017-07-20: 1 mL

## 2017-07-20 MED ORDER — METHYLPREDNISOLONE ACETATE 40 MG/ML IJ SUSP
40.0000 mg | INTRAMUSCULAR | Status: AC | PRN
  Administered 2017-07-20: 40 mg via INTRA_ARTICULAR

## 2017-07-20 NOTE — Progress Notes (Signed)
Office Visit Note   Patient: Felicia Collier           Date of Birth: Aug 13, 1956           MRN: [id number] Visit Date: 07/20/2017              Requested by: Bing Neighbors, FNP [address] [place], [place] [address] PCP: Bing Neighbors, FNP   Assessment & Plan: Visit Diagnoses:  1. Chronic pain of left knee   2. Unilateral primary osteoarthritis, left knee   3. Pain in left wrist     Plan: Other than injections there is nothing else I really recommend for her. At some point she may need a left knee replacement however I don't think she would be able to be compliant with the therapy this needing getting the therapy given her psychosocial situation. I'm concerned about her frequent trips to the emergency room for a multitude of complaints over the last year and more when I review her chart. I think she is someone that for now we should consider just intermittent injections in her wrist and knee as needed. I talked her about this in length and she understands this as well.  Follow-Up Instructions: Return if symptoms worsen or fail to improve.   Orders:  No orders of the defined types were placed in this encounter.  No orders of the defined types were placed in this encounter.     Procedures: Large Joint Inj Date/Time: 07/20/2017 10:40 AM Performed by: Kathryne Hitch Authorized by: Doneen Poisson Y   Location:  Knee Site:  L knee Ultrasound Guidance: No   Fluoroscopic Guidance: No   Arthrogram: No   Medications:  3 mL lidocaine 1 %; 40 mg methylPREDNISolone acetate 40 MG/ML Hand/UE Inj Date/Time: 07/20/2017 10:40 AM Performed by: Kathryne Hitch Authorized by: Kathryne Hitch   Condition: de Quervain's   Site:  L extensor compartment 1 Medications:  1 mL lidocaine 1 %; 40 mg methylPREDNISolone acetate 40 MG/ML     Clinical Data: No additional findings.   Subjective: No chief complaint on file. The patient comes in with chief  complaint of left wrist pain of left knee pain. I actually saw her for this before remotely. She is someone with schizophrenia and does have known moderate to severe arthritis of her left knee as well as a previous left wrist injury. We will only inject of these in the past. My review her chart she has been in and out of the ER multiple times every single month even since January of this year. She understands medical compliance issue not be concerned proceeding with any type of surgery such as a total knee replacement and requires a significant amount of therapy and postoperative care which I don't think should be able to be compliant with at all.  HPI  Review of Systems She currently denies any headache, chest pain, shortness of breath, fever, chills, nausea, vomiting.  Objective: Vital Signs: There were no vitals taken for this visit.  Physical Exam She is alert and oriented 3 and in no acute distress Ortho Exam Examination of her left wrist shows full range of motion and some tenderness over the radial styloid consistent more of a de Quervain's tenosynovitis but also some arthritic changes in the wrist. Examination of her left knee shows mild varus malalignment with medial joint line tenderness and patellofemoral crepitation. The knee is ligamentously stable. Specialty Comments:  No specialty comments available.  Imaging: No  results found. X-rays in the past of her left knee and left wrist are reviewed showing moderate arthritis of the medial compartment of her left knee and history of a previous radial styloid fracture that is healed.  PMFS History: Patient Active Problem List   Diagnosis Date Noted  . Pain in left wrist 07/20/2017  . Schizophrenia (HCC) 05/01/2017  . Chronic pain of left knee 02/09/2017  . Unilateral primary osteoarthritis, left knee 02/09/2017  . Neuropathy 11/22/2016  . Gastroesophageal reflux disease without esophagitis 11/22/2016  . Weight gain 11/22/2016  .  Atypical chest pain 11/04/2016   Past Medical History:  Diagnosis Date  . Chronic knee pain   . Schizophrenia (HCC)     Family History  Problem Relation Age of Onset  . Family history unknown: Yes    Past Surgical History:  Procedure Laterality Date  . CHOLECYSTECTOMY     Social History   Occupational History  . Not on file.   Social History Main Topics  . Smoking status: Never Smoker  . Smokeless tobacco: Never Used  . Alcohol use No  . Drug use: No  . Sexual activity: Not on file

## 2017-07-21 ENCOUNTER — Encounter (HOSPITAL_COMMUNITY): Payer: Self-pay | Admitting: Emergency Medicine

## 2017-07-21 ENCOUNTER — Emergency Department (HOSPITAL_COMMUNITY)
Admission: EM | Admit: 2017-07-21 | Discharge: 2017-07-21 | Disposition: A | Discharge status: Home / Self Care | Payer: Medicaid Other | Attending: Emergency Medicine | Admitting: Emergency Medicine

## 2017-07-21 DIAGNOSIS — M545 Low back pain, unspecified: Secondary | ICD-10-CM

## 2017-07-21 DIAGNOSIS — Z79899 Other long term (current) drug therapy: Secondary | ICD-10-CM | POA: Diagnosis not present

## 2017-07-21 DIAGNOSIS — Y929 Unspecified place or not applicable: Secondary | ICD-10-CM | POA: Insufficient documentation

## 2017-07-21 DIAGNOSIS — X500XXA Overexertion from strenuous movement or load, initial encounter: Secondary | ICD-10-CM | POA: Insufficient documentation

## 2017-07-21 DIAGNOSIS — Y9389 Activity, other specified: Secondary | ICD-10-CM | POA: Diagnosis not present

## 2017-07-21 DIAGNOSIS — S39012A Strain of muscle, fascia and tendon of lower back, initial encounter: Secondary | ICD-10-CM | POA: Diagnosis not present

## 2017-07-21 DIAGNOSIS — Y999 Unspecified external cause status: Secondary | ICD-10-CM | POA: Diagnosis not present

## 2017-07-21 DIAGNOSIS — S3992XA Unspecified injury of lower back, initial encounter: Secondary | ICD-10-CM | POA: Diagnosis present

## 2017-07-21 MED ORDER — IBUPROFEN 200 MG PO TABS
600.0000 mg | ORAL_TABLET | Freq: Once | ORAL | Status: AC
  Administered 2017-07-21: 600 mg via ORAL
  Filled 2017-07-21: qty 3

## 2017-07-21 MED ORDER — CYCLOBENZAPRINE HCL 10 MG PO TABS: 10.0000 mg | ORAL_TABLET | Freq: Two times a day (BID) | ORAL | 0 refills | Status: DC | PRN

## 2017-07-21 MED ORDER — CYCLOBENZAPRINE HCL 10 MG PO TABS
5.0000 mg | ORAL_TABLET | Freq: Once | ORAL | Status: AC
  Administered 2017-07-21: 5 mg via ORAL
  Filled 2017-07-21: qty 1

## 2017-07-21 MED ORDER — IBUPROFEN 600 MG PO TABS: 600.0000 mg | ORAL_TABLET | Freq: Four times a day (QID) | ORAL | 0 refills | Status: DC | PRN

## 2017-07-21 NOTE — Discharge Instructions (Signed)
Please read and follow all provided instructions.  Your diagnoses today include:  1. Acute bilateral low back pain without sciatica   2. Strain of lumbar region, initial encounter     Tests performed today include: Vital signs - see below for your results today  Medications prescribed:   Take any prescribed medications only as directed.  Home care instructions:  Follow any educational materials contained in this packet Please rest, use ice or heat on your back for the next several days Do not lift, push, pull anything more than 10 pounds for the next week  Follow-up instructions: Please follow-up with your primary care provider in the next 1 week for further evaluation of your symptoms.   Return instructions:  SEEK IMMEDIATE MEDICAL ATTENTION IF YOU HAVE: New numbness, tingling, weakness, or problem with the use of your arms or legs Severe back pain not relieved with medications Loss control of your bowels or bladder Increasing pain in any areas of the body (such as chest or abdominal pain) Shortness of breath, dizziness, or fainting.  Worsening nausea (feeling sick to your stomach), vomiting, fever, or sweats Any other emergent concerns regarding your health   Additional Information:  Your vital signs today were: BP (!) 142/77    Pulse 68    Temp 98.7 F (37.1 C)    Resp 16    SpO2 96%  If your blood pressure (BP) was elevated above 135/85 this visit, please have this repeated by your doctor within one month. --------------

## 2017-07-21 NOTE — ED Notes (Signed)
ED Provider at bedside. 

## 2017-07-21 NOTE — ED Provider Notes (Signed)
[facility]-EMERGENCY DEPT Provider Note   CSN: [id number] Arrival date & time: 07/21/17  1027     History   Chief Complaint Chief Complaint  Patient presents with  . Back Pain    HPI Felicia Collier is a 61 y.o. female.  HPI  60 y.o. female, presents to the Emergency Department today due to low back pain. This occurred yesterday s/p moving furniture. Pt attempted to "crack" their back to decrease the pain, but made it worse. Notes pain with radiation into BLE. Rates pain 4/10. Aching/cramping sensation bilateral lower lumbar musculature. No midline tenderness. Pt able to ambulate. No saddle anesthesia. No loss of bowel or bladder function. Pt attempted rubbing alcohol on lower back without relief. No fevers. No headache. No N/V. No CP/SOB/ABD pain. No urinary symptoms. No other symptoms noted.    Past Medical History:  Diagnosis Date  . Chronic knee pain   . Schizophrenia [facility])     Patient Active Problem List   Diagnosis Date Noted  . Pain in left wrist 07/20/2017  . Schizophrenia (HCC) 05/01/2017  . Chronic pain of left knee 02/09/2017  . Unilateral primary osteoarthritis, left knee 02/09/2017  . Neuropathy 11/22/2016  . Gastroesophageal reflux disease without esophagitis 11/22/2016  . Weight gain 11/22/2016  . Atypical chest pain 11/04/2016    Past Surgical History:  Procedure Laterality Date  . CHOLECYSTECTOMY      OB History    No data available       Home Medications    Prior to Admission medications   Medication Sig Start Date End Date Taking? Authorizing Provider  cyclobenzaprine (FLEXERIL) 10 MG tablet Take 1 tablet (10 mg total) by mouth 2 (two) times daily as needed for muscle spasms. 05/31/17   Bing Neighbors, FNP  dicyclomine (BENTYL) 20 MG tablet Take 1 tablet (20 mg total) by mouth 2 (two) times daily. Patient not taking: Reported on 06/24/2017 04/13/17   Bing Neighbors, FNP  dicyclomine (BENTYL) 20 MG tablet Take 1 tablet (20 mg total) by mouth 2  (two) times daily. Patient not taking: Reported on 06/16/2017 04/13/17   Bing Neighbors, FNP  Estradiol (ESTROGEL) 0.75 MG/1.25 GM (0.06%) topical gel Place 1.25 g onto the skin daily. 06/16/17   Mackuen, Courteney Lyn, MD  gabapentin (NEURONTIN) 100 MG capsule Take 100 mg by mouth 3 (three) times daily.    [provider]  gabapentin (NEURONTIN) 300 MG capsule Take 1 capsule (300 mg total) by mouth 3 (three) times daily. 05/31/17   Bing Neighbors, FNP  haloperidol decanoate (HALDOL DECANOATE) 50 MG/ML injection Inject 50 mg into the muscle every 28 (twenty-eight) days.    [provider]  hydrocortisone (ANUSOL-HC) 2.5 % rectal cream Place 1 application rectally 2 (two) times daily. 06/24/17   Bing Neighbors, FNP  hydrocortisone (ANUSOL-HC) 25 MG suppository Place 1 suppository (25 mg total) rectally 2 (two) times daily. For 7 days Patient not taking: Reported on 06/24/2017 06/09/17   Mancel Bale, MD  hydrocortisone cream 1 % Apply 1 application topically 2 (two) times daily. Do not apply to face Patient not taking: Reported on 06/16/2017 04/12/17   Arthor Captain, PA-C  methocarbamol (ROBAXIN) 500 MG tablet Take 1 tablet (500 mg total) by mouth 2 (two) times daily. Patient not taking: Reported on 06/16/2017 04/13/17   Bing Neighbors, FNP  naproxen (NAPROSYN) 375 MG tablet Take 1 tablet (375 mg total) by mouth 2 (two) times daily. Patient not taking: Reported on 06/16/2017 04/10/17  Maxwell Caul, PA-C  omeprazole (PRILOSEC) 40 MG capsule Take 1 capsule (40 mg total) by mouth daily. Patient not taking: Reported on 06/16/2017 03/09/17   Bing Neighbors, FNP  ondansetron (ZOFRAN ODT) 4 MG disintegrating tablet Take 1 tablet (4 mg total) by mouth every 8 (eight) hours as needed for nausea or vomiting. 06/29/17   Shaune Pollack, MD  pantoprazole (PROTONIX) 20 MG tablet Take 1 tablet (20 mg total) by mouth daily. 06/29/17 07/13/17  Shaune Pollack, MD  potassium chloride (K-DUR)  10 MEQ tablet Take 1 tablet (10 mEq total) by mouth daily. 03/02/17   Bing Neighbors, FNP  sucralfate (CARAFATE) 1 GM/10ML suspension Take 10 mLs (1 g total) by mouth 4 (four) times daily -  with meals and at bedtime. 06/29/17   Shaune Pollack, MD    Family History Family History  Problem Relation Age of Onset  . Family history unknown: Yes    Social History Social History  Substance Use Topics  . Smoking status: Never Smoker  . Smokeless tobacco: Never Used  . Alcohol use No     Allergies   Patient has no known allergies.   Review of Systems Review of Systems  Constitutional: Negative for fever.  Gastrointestinal: Negative for nausea.  Musculoskeletal: Positive for back pain.  Skin: Negative for wound.  Neurological: Negative for numbness and headaches.     Physical Exam Updated Vital Signs BP (!) 142/77   Pulse 68   Temp 98.7 F (37.1 C)   Resp 16   SpO2 96%   Physical Exam  Constitutional: She is oriented to person, place, and time. Vital signs are normal. She appears well-developed and well-nourished.  HENT:  Head: Normocephalic and atraumatic.  Right Ear: Hearing normal.  Left Ear: Hearing normal.  Eyes: Pupils are equal, round, and reactive to light. Conjunctivae and EOM are normal.  Neck: Normal range of motion. Neck supple.  Cardiovascular: Normal rate, regular rhythm, normal heart sounds and intact distal pulses.   Pulmonary/Chest: Effort normal and breath sounds normal.  Musculoskeletal: Normal range of motion.  TTP bilateral lower lumbar musculature. No midline tenderness. Ambulates well.   Neurological: She is alert and oriented to person, place, and time.  Skin: Skin is warm and dry.  Psychiatric: She has a normal mood and affect. Her speech is normal and behavior is normal. Thought content normal.  Nursing note and vitals reviewed.    ED Treatments / Results  Labs (all labs ordered are listed, but only abnormal results are  displayed) Labs Reviewed - No data to display  EKG  EKG Interpretation None       Radiology No results found.  Procedures Procedures (including critical care time)  Medications Ordered in ED Medications  ibuprofen (ADVIL,MOTRIN) tablet 600 mg (not administered)  cyclobenzaprine (FLEXERIL) tablet 5 mg (not administered)     Initial Impression / Assessment and Plan / ED Course  I have reviewed the triage vital signs and the nursing notes.  Pertinent labs & imaging results that were available during my care of the patient were reviewed by me and considered in my medical decision making (see chart for details).  Final Clinical Impressions(s) / ED Diagnoses     {I have reviewed the relevant previous healthcare records.  {I obtained HPI from historian.   ED Course:  Assessment: Patient is a 61 y.o. female presents to the Emergency Department today due to low back pain. This occurred yesterday s/p moving furniture. Pt attempted to "crack"  their back to decrease the pain, but made it worse. Notes pain with radiation into BLE. Rates pain 4/10. Aching/cramping sensation bilateral lower lumbar musculature. No midline tenderness. Pt able to ambulate. No saddle anesthesia. No loss of bowel or bladder function. Pt attempted rubbing alcohol on lower back without relief. No fevers. No headache. No N/V. No CP/SOB/ABD pain. No urinary symptoms.. No neurological deficits appreciated. Patient is ambulatory. No warning symptoms of back pain including: fecal incontinence, urinary retention or overflow incontinence, night sweats, waking from sleep with back pain, unexplained fevers or weight loss, h/o cancer, IVDU, recent trauma. No concern for cauda equina, epidural abscess, or other serious cause of back pain. Conservative measures such as rest, ice/heat and pain medicine indicated with PCP follow-up if no improvement with conservative management.  Disposition/Plan:  DC Home Additional Verbal  discharge instructions given and discussed with patient.  Pt Instructed to f/u with PCP in the next week for evaluation and treatment of symptoms. Return precautions given Pt acknowledges and agrees with plan  Supervising Physician Raeford RazorKohut, Stephen, MD  Final diagnoses:  Acute bilateral low back pain without sciatica  Strain of lumbar region, initial encounter    New Prescriptions New Prescriptions   No medications on file     Audry PiliMohr, Lacinda Curvin, Cordelia Poche-C 07/21/17 1130    Raeford RazorKohut, Stephen, MD 08/07/17 [phone]

## 2017-07-21 NOTE — ED Triage Notes (Signed)
Pt reports she has been having lower back pain. Moved some things yesterday. Cracked back in attempt to decrease pain, but pain became worse and radiates around flanks now. Pt ambulatory.

## 2017-07-24 ENCOUNTER — Emergency Department (HOSPITAL_COMMUNITY)
Admission: EM | Admit: 2017-07-24 | Discharge: 2017-07-24 | Disposition: A | Discharge status: Home / Self Care | Payer: Medicaid Other | Attending: Emergency Medicine | Admitting: Emergency Medicine

## 2017-07-24 ENCOUNTER — Encounter (HOSPITAL_COMMUNITY): Payer: Self-pay

## 2017-07-24 DIAGNOSIS — M545 Low back pain, unspecified: Secondary | ICD-10-CM

## 2017-07-24 DIAGNOSIS — N3000 Acute cystitis without hematuria: Secondary | ICD-10-CM | POA: Diagnosis not present

## 2017-07-24 DIAGNOSIS — Z79899 Other long term (current) drug therapy: Secondary | ICD-10-CM | POA: Diagnosis not present

## 2017-07-24 DIAGNOSIS — R1030 Lower abdominal pain, unspecified: Secondary | ICD-10-CM | POA: Diagnosis present

## 2017-07-24 LAB — URINALYSIS, ROUTINE W REFLEX MICROSCOPIC
BILIRUBIN URINE: NEGATIVE
Bacteria, UA: NONE SEEN
Glucose, UA: NEGATIVE mg/dL
HGB URINE DIPSTICK: NEGATIVE
KETONES UR: NEGATIVE mg/dL
NITRITE: NEGATIVE
PROTEIN: NEGATIVE mg/dL
Specific Gravity, Urine: 1.003 — ABNORMAL LOW (ref 1.005–1.030)
pH: 7 (ref 5.0–8.0)

## 2017-07-24 MED ORDER — NAPROXEN 500 MG PO TBEC: 500.0000 mg | DELAYED_RELEASE_TABLET | Freq: Two times a day (BID) | ORAL | 0 refills | Status: DC

## 2017-07-24 MED ORDER — NITROFURANTOIN MONOHYD MACRO 100 MG PO CAPS: 100.0000 mg | ORAL_CAPSULE | Freq: Two times a day (BID) | ORAL | 0 refills | Status: AC

## 2017-07-24 MED ORDER — DICLOFENAC SODIUM 1 % TD GEL
2.0000 g | Freq: Four times a day (QID) | TRANSDERMAL | Status: DC
  Administered 2017-07-24: 2 g via TOPICAL
  Filled 2017-07-24: qty 100

## 2017-07-24 NOTE — Discharge Instructions (Signed)
You have a urinary tract infection. Please take your antibiotics, 1 tablet twice a day for 3 days. Please follow up with your primary care doctor to ensure that your abdominal pain and pain with urination resolves with this medication. For your back pain, you can take naproxen, 1 tablet twice a day with meals. Please follow up with your primary care provider regarding your back pain.   Please return to the ED if you develop fevers or chills. Please call your primary care physician if your symptoms worsen or do not improve with the above therapies.

## 2017-07-24 NOTE — ED Provider Notes (Signed)
I saw and evaluated the patient, reviewed the resident's note and I agree with the findings and plan.   EKG Interpretation None       Patient with acute bilateral lower abd pain. Also has her chronic back pain. Abd pain x 2 days, likely UTI given acute dysuria as well. Afebrile, VSS. No vomiting. No tenderness on my exam. Highly doubt AAA but given age, u/s performed by me. Treat for UTI, highly doubt acute intra-abd emergency. D/c home with antibiotics.   EMERGENCY DEPARTMENT ULTRASOUND  Study: Limited Retroperitoneal Ultrasound of the Abdominal Aorta.  INDICATIONS:Abdominal pain, Back pain and Age>55 Multiple views of the abdominal aorta were obtained in real-time from the diaphragmatic hiatus to the aortic bifurcation in transverse planes with a multi-frequency probe.  PERFORMED BY: Myself IMAGES ARCHIVED?: Yes LIMITATIONS:  Bowel gas INTERPRETATION:  No abdominal aortic aneurysm     Pricilla LovelessGoldston, Everly Rubalcava, MD 07/24/17 [phone]

## 2017-07-24 NOTE — ED Triage Notes (Signed)
Pt complains of back pain that is now radiating to her abdomen

## 2017-07-24 NOTE — ED Provider Notes (Signed)
[facility]-EMERGENCY DEPT Provider Note   CSN: [id number] Arrival date & time: 07/24/17  4098  History   Chief Complaint Chief Complaint  Patient presents with  . Abdominal Pain   HPI Felicia Collier is a 61 y.o. female.  The patient has a PMH of chronic back pain and schizophrenia. Per chart review she was last seen in the ED on 07/21/2017. She was diagnosed with an acute exacerbation of chronic back pain that was precipitated by the patient moving furniture. She had no red flag symptoms that required emergent imaging and was discharged with ibuprofen, cyclobenzaprine, and strict return precautions.   She returns today because the medications and conservative interventions prescribed at the last visit are not helping her back/leg pain. The medications make her feel "burned up inside" and she states she can no longer take them. She cannot specifically localize where she feels burning, but at one point during the interview points to her epigastric region as the most concerning part of her body that "burns" when she takes her medicine. She also endorses pain into her bilateral lower abdominal quadrants since her last ED visit. She thinks that the abdominal pain originates in her back and radiates around the front of her stomach. The pain is described as dull, rated as a 5/10, and is similar to the pain she has felt in her back/legs since her last ED visit. In addition to this abdominal pain, she has experienced some burning with urination. She denies recent fevers.  She denies loss of bladder/bowel function, hematemesis, black stools, blood in her stool, nausea/vomiting, focal weakness/numbness, chest pain, SOB, and headaches.       Past Medical History:  Diagnosis Date  . Chronic knee pain   . Schizophrenia [facility])     Patient Active Problem List   Diagnosis Date Noted  . Pain in left wrist 07/20/2017  . Schizophrenia (HCC) 05/01/2017  . Chronic pain of left knee 02/09/2017  . Unilateral primary  osteoarthritis, left knee 02/09/2017  . Neuropathy 11/22/2016  . Gastroesophageal reflux disease without esophagitis 11/22/2016  . Weight gain 11/22/2016  . Atypical chest pain 11/04/2016    Past Surgical History:  Procedure Laterality Date  . CHOLECYSTECTOMY      OB History    No data available       Home Medications    Prior to Admission medications   Medication Sig Start Date End Date Taking? Authorizing Provider  cyclobenzaprine (FLEXERIL) 10 MG tablet Take 1 tablet (10 mg total) by mouth 2 (two) times daily as needed for muscle spasms. 07/21/17  Yes Audry Pili, PA-C  haloperidol decanoate (HALDOL DECANOATE) 50 MG/ML injection Inject 50 mg into the muscle every 28 (twenty-eight) days.   Yes [provider]  ibuprofen (ADVIL,MOTRIN) 600 MG tablet Take 1 tablet (600 mg total) by mouth every 6 (six) hours as needed. Patient taking differently: Take 600 mg by mouth every 6 (six) hours as needed for moderate pain.  07/21/17  Yes Audry Pili, PA-C  sucralfate (CARAFATE) 1 GM/10ML suspension Take 10 mLs (1 g total) by mouth 4 (four) times daily -  with meals and at bedtime. 06/29/17  Yes Shaune Pollack, MD  dicyclomine (BENTYL) 20 MG tablet Take 1 tablet (20 mg total) by mouth 2 (two) times daily. Patient not taking: Reported on 06/24/2017 04/13/17   Bing Neighbors, FNP  dicyclomine (BENTYL) 20 MG tablet Take 1 tablet (20 mg total) by mouth 2 (two) times daily. Patient not taking: Reported on 06/16/2017 04/13/17  Bing Neighbors, FNP  Estradiol (ESTROGEL) 0.75 MG/1.25 GM (0.06%) topical gel Place 1.25 g onto the skin daily. Patient not taking: Reported on 07/24/2017 06/16/17   Mackuen, Cindee Salt, MD  gabapentin (NEURONTIN) 300 MG capsule Take 1 capsule (300 mg total) by mouth 3 (three) times daily. Patient not taking: Reported on 07/24/2017 05/31/17   Bing Neighbors, FNP  hydrocortisone (ANUSOL-HC) 2.5 % rectal cream Place 1 application rectally 2 (two) times daily. Patient  not taking: Reported on 07/24/2017 06/24/17   Bing Neighbors, FNP  hydrocortisone (ANUSOL-HC) 25 MG suppository Place 1 suppository (25 mg total) rectally 2 (two) times daily. For 7 days Patient not taking: Reported on 06/24/2017 06/09/17   Mancel Bale, MD  hydrocortisone cream 1 % Apply 1 application topically 2 (two) times daily. Do not apply to face Patient not taking: Reported on 06/16/2017 04/12/17   Arthor Captain, PA-C  methocarbamol (ROBAXIN) 500 MG tablet Take 1 tablet (500 mg total) by mouth 2 (two) times daily. Patient not taking: Reported on 06/16/2017 04/13/17   Bing Neighbors, FNP  naproxen (EC NAPROSYN) 500 MG EC tablet Take 1 tablet (500 mg total) by mouth 2 (two) times daily with a meal. 07/24/17 07/24/18  Rozann Lesches, MD  nitrofurantoin, macrocrystal-monohydrate, (MACROBID) 100 MG capsule Take 1 capsule (100 mg total) by mouth 2 (two) times daily. 07/24/17 07/27/17  Rozann Lesches, MD  omeprazole (PRILOSEC) 40 MG capsule Take 1 capsule (40 mg total) by mouth daily. Patient not taking: Reported on 06/16/2017 03/09/17   Bing Neighbors, FNP  ondansetron (ZOFRAN ODT) 4 MG disintegrating tablet Take 1 tablet (4 mg total) by mouth every 8 (eight) hours as needed for nausea or vomiting. Patient not taking: Reported on 07/24/2017 06/29/17   Shaune Pollack, MD  pantoprazole (PROTONIX) 20 MG tablet Take 1 tablet (20 mg total) by mouth daily. Patient not taking: Reported on 07/24/2017 06/29/17 07/13/17  Shaune Pollack, MD  potassium chloride (K-DUR) 10 MEQ tablet Take 1 tablet (10 mEq total) by mouth daily. Patient not taking: Reported on 07/24/2017 03/02/17   Bing Neighbors, FNP    Family History Family History  Problem Relation Age of Onset  . Family history unknown: Yes    Social History Social History  Substance Use Topics  . Smoking status: Never Smoker  . Smokeless tobacco: Never Used  . Alcohol use No     Allergies   Patient has no known allergies.   Review of  Systems Review of Systems  Constitutional: Negative for appetite change, chills and fever.  HENT: Negative for sinus pain and sinus pressure.   Respiratory: Negative for cough and shortness of breath.   Cardiovascular: Negative for chest pain and leg swelling.  Gastrointestinal: Positive for abdominal distention and abdominal pain. Negative for blood in stool, constipation, diarrhea, nausea and vomiting.  Genitourinary: Positive for dysuria. Negative for flank pain, frequency, hematuria and urgency.  Musculoskeletal: Positive for arthralgias (chronic knee and back pain), back pain and myalgias.  Neurological: Negative for light-headedness, numbness and headaches.   Physical Exam Updated Vital Signs BP 138/85   Pulse 75   Temp 98.1 F (36.7 C) (Oral)   Resp 18   SpO2 99%   Physical Exam  Constitutional:  Elderly woman laying comfortably in bed in no acute distress.  Cardiovascular: Normal rate and regular rhythm.  Exam reveals no friction rub.   No murmur heard. Pulmonary/Chest: Effort normal. No respiratory distress. She has no wheezes.  Abdominal: Soft. She  exhibits no distension and no mass. There is tenderness (with palpation in suprapubic region, bilateral lower quadrants). There is no rebound and no guarding.  Musculoskeletal: She exhibits no edema (of bilateral lower extremities) or tenderness (of bilateral lower extremities).  Skin: Skin is warm and dry. Capillary refill takes less than 2 seconds. No erythema.  Psychiatric: She has a normal mood and affect. Her behavior is normal. Thought content normal.   ED Treatments / Results  Labs (all labs ordered are listed, but only abnormal results are displayed) Labs Reviewed  URINALYSIS, ROUTINE W REFLEX MICROSCOPIC - Abnormal; Notable for the following:       Result Value   Color, Urine COLORLESS (*)    Specific Gravity, Urine 1.003 (*)    Leukocytes, UA MODERATE (*)    Squamous Epithelial / LPF 0-5 (*)    All other  components within normal limits  URINE CULTURE    EKG  EKG Interpretation None      Radiology No results found.  Procedures Procedures (including critical care time)  Medications Ordered in ED Medications  diclofenac sodium (VOLTAREN) 1 % transdermal gel 2 g (not administered)    Initial Impression / Assessment and Plan / ED Course  I have reviewed the triage vital signs and the nursing notes.  Pertinent labs & imaging results that were available during my care of the patient were reviewed by me and considered in my medical decision making (see chart for details).  The patient was previously evaluated in the ED for acute on chronic back pain and returns today after conservative measures offered at last ED visit have failed to improve her back pain. She has also developed new symptoms of lower abdominal/suprapubic pain and pain with urination since her last visit. Given these new symptoms, a urinalysis will be obtained to rule out a UTI.   It seems likely that the patient's abdominal pain is related to her acute on chronic back pain, as the patient describes that her abdominal pain actually originates in her back and wraps around the front of her body into her lower abdomen. This is likely since the patient has been unable to tolerate the medications prescribed at the last ED visit and not been taking anything to help with her back pain. If no UTI identified, will likely change outpatient medications to treat acute back pain to regimen of tylenol, naproxen, and Voltaren gel. Will also tell the patient to follow up with PCP regarding further management of this acute on chronic back pain.   Final Clinical Impressions(s) / ED Diagnoses   Final diagnoses:  Acute cystitis without hematuria  Acute bilateral low back pain without sciatica   Patient found to have urine + for LE. Will send urine for culture and prescribe Macrobid 100 ng BID for 3 days to treat uncomplicated UTI. Instructed  patient to follow up with PCP regarding her lower abdominal pain and dysuria.  Will tell patient to take naproxen rather than previously prescribed ibuprofen and cyclobenzaprine. Instructed patient that back pain can last for several weeks and gave instructions to keep mobile, as this will help resolve her pain. Also instructed her to follow up with primary care provider regarding improvement in her acute on chronic back pain with use of scheduled naproxen.    Patient discharged with instructions to return if fever develops and to follow up with PCP regarding resolution of her symptoms.  New Prescriptions New Prescriptions   NAPROXEN (EC NAPROSYN) 500 MG EC TABLET  Take 1 tablet (500 mg total) by mouth 2 (two) times daily with a meal.   NITROFURANTOIN, MACROCRYSTAL-MONOHYDRATE, (MACROBID) 100 MG CAPSULE    Take 1 capsule (100 mg total) by mouth 2 (two) times daily.     Rozann Lesches, MD 07/24/17 [id number]    Pricilla Loveless, MD 07/29/17 [phone]

## 2017-07-26 ENCOUNTER — Emergency Department (HOSPITAL_COMMUNITY)
Admission: EM | Admit: 2017-07-26 | Discharge: 2017-07-26 | Disposition: A | Discharge status: Home / Self Care | Payer: Medicaid Other | Attending: Emergency Medicine | Admitting: Emergency Medicine

## 2017-07-26 ENCOUNTER — Encounter (HOSPITAL_COMMUNITY): Payer: Self-pay | Admitting: Emergency Medicine

## 2017-07-26 ENCOUNTER — Encounter: Payer: Self-pay | Admitting: Family Medicine

## 2017-07-26 ENCOUNTER — Ambulatory Visit (INDEPENDENT_AMBULATORY_CARE_PROVIDER_SITE_OTHER): Payer: Medicaid Other | Admitting: Family Medicine

## 2017-07-26 VITALS — BP 120/66 | HR 76 | Temp 98.1°F | Resp 14 | Ht 65.0 in | Wt 163.4 lb

## 2017-07-26 DIAGNOSIS — M549 Dorsalgia, unspecified: Secondary | ICD-10-CM | POA: Diagnosis not present

## 2017-07-26 DIAGNOSIS — G8929 Other chronic pain: Secondary | ICD-10-CM

## 2017-07-26 DIAGNOSIS — R1084 Generalized abdominal pain: Secondary | ICD-10-CM | POA: Diagnosis not present

## 2017-07-26 DIAGNOSIS — M545 Low back pain, unspecified: Secondary | ICD-10-CM

## 2017-07-26 LAB — URINE CULTURE

## 2017-07-26 LAB — POCT URINALYSIS DIP (DEVICE)
BILIRUBIN URINE: NEGATIVE
Glucose, UA: NEGATIVE mg/dL
Hgb urine dipstick: NEGATIVE
Ketones, ur: NEGATIVE mg/dL
Nitrite: NEGATIVE
PH: 6.5 (ref 5.0–8.0)
PROTEIN: NEGATIVE mg/dL
SPECIFIC GRAVITY, URINE: 1.015 (ref 1.005–1.030)
Urobilinogen, UA: 0.2 mg/dL (ref 0.0–1.0)

## 2017-07-26 NOTE — ED Provider Notes (Signed)
[facility]-EMERGENCY DEPT Provider Note   CSN: [id number] Arrival date & time: 07/26/17  1859     History   Chief Complaint Chief Complaint  Patient presents with  . Abdominal Pain  . back pain    HPI Memori Sammon is a 61 y.o. female.  Patient with history of schizophrenia, GERD, chronic back pain presents with complaint of bilateral lower back pain that started 5 days ago. She reports her pain radiates around to bilateral lower abdomen. No nausea, vomiting, diarrhea or constipation. No fever, dysuria. She denies urinary/bowel incontinence. The back pain radiates to bilateral proximal thighs as well. She states she was seen in the ED this week and given medications that are not helping.    The history is provided by the patient. No language interpreter was used.    Past Medical History:  Diagnosis Date  . Chronic knee pain   . Schizophrenia [facility])     Patient Active Problem List   Diagnosis Date Noted  . Pain in left wrist 07/20/2017  . Schizophrenia (HCC) 05/01/2017  . Chronic pain of left knee 02/09/2017  . Unilateral primary osteoarthritis, left knee 02/09/2017  . Neuropathy 11/22/2016  . Gastroesophageal reflux disease without esophagitis 11/22/2016  . Weight gain 11/22/2016  . Atypical chest pain 11/04/2016    Past Surgical History:  Procedure Laterality Date  . CHOLECYSTECTOMY      OB History    No data available       Home Medications    Prior to Admission medications   Medication Sig Start Date End Date Taking? Authorizing Provider  cyclobenzaprine (FLEXERIL) 10 MG tablet Take 1 tablet (10 mg total) by mouth 2 (two) times daily as needed for muscle spasms. 07/21/17  Yes Audry Pili, PA-C  haloperidol decanoate (HALDOL DECANOATE) 50 MG/ML injection Inject 50 mg into the muscle every 28 (twenty-eight) days.   Yes [provider]  dicyclomine (BENTYL) 20 MG tablet Take 1 tablet (20 mg total) by mouth 2 (two) times daily. Patient not taking: Reported  on 06/24/2017 04/13/17   Bing Neighbors, FNP  dicyclomine (BENTYL) 20 MG tablet Take 1 tablet (20 mg total) by mouth 2 (two) times daily. Patient not taking: Reported on 06/16/2017 04/13/17   Bing Neighbors, FNP  Estradiol (ESTROGEL) 0.75 MG/1.25 GM (0.06%) topical gel Place 1.25 g onto the skin daily. Patient not taking: Reported on 07/24/2017 06/16/17   Mackuen, Cindee Salt, MD  gabapentin (NEURONTIN) 300 MG capsule Take 1 capsule (300 mg total) by mouth 3 (three) times daily. Patient not taking: Reported on 07/24/2017 05/31/17   Bing Neighbors, FNP  hydrocortisone (ANUSOL-HC) 2.5 % rectal cream Place 1 application rectally 2 (two) times daily. Patient not taking: Reported on 07/24/2017 06/24/17   Bing Neighbors, FNP  hydrocortisone (ANUSOL-HC) 25 MG suppository Place 1 suppository (25 mg total) rectally 2 (two) times daily. For 7 days Patient not taking: Reported on 06/24/2017 06/09/17   Mancel Bale, MD  hydrocortisone cream 1 % Apply 1 application topically 2 (two) times daily. Do not apply to face Patient not taking: Reported on 06/16/2017 04/12/17   Arthor Captain, PA-C  ibuprofen (ADVIL,MOTRIN) 600 MG tablet Take 1 tablet (600 mg total) by mouth every 6 (six) hours as needed. Patient not taking: Reported on 07/26/2017 07/21/17   Audry Pili, PA-C  methocarbamol (ROBAXIN) 500 MG tablet Take 1 tablet (500 mg total) by mouth 2 (two) times daily. Patient not taking: Reported on 06/16/2017 04/13/17   Bing Neighbors,  FNP  naproxen (EC NAPROSYN) 500 MG EC tablet Take 1 tablet (500 mg total) by mouth 2 (two) times daily with a meal. Patient not taking: Reported on 07/26/2017 07/24/17 07/24/18  Rozann Lesches, MD  nitrofurantoin, macrocrystal-monohydrate, (MACROBID) 100 MG capsule Take 1 capsule (100 mg total) by mouth 2 (two) times daily. Patient not taking: Reported on 07/26/2017 07/24/17 07/27/17  Rozann Lesches, MD  omeprazole (PRILOSEC) 40 MG capsule Take 1 capsule (40 mg total) by mouth  daily. Patient not taking: Reported on 06/16/2017 03/09/17   Bing Neighbors, FNP  ondansetron (ZOFRAN ODT) 4 MG disintegrating tablet Take 1 tablet (4 mg total) by mouth every 8 (eight) hours as needed for nausea or vomiting. Patient not taking: Reported on 07/24/2017 06/29/17   Shaune Pollack, MD  pantoprazole (PROTONIX) 20 MG tablet Take 1 tablet (20 mg total) by mouth daily. Patient not taking: Reported on 07/24/2017 06/29/17 07/13/17  Shaune Pollack, MD  potassium chloride (K-DUR) 10 MEQ tablet Take 1 tablet (10 mEq total) by mouth daily. Patient not taking: Reported on 07/24/2017 03/02/17   Bing Neighbors, FNP  sucralfate (CARAFATE) 1 GM/10ML suspension Take 10 mLs (1 g total) by mouth 4 (four) times daily -  with meals and at bedtime. Patient not taking: Reported on 07/26/2017 06/29/17   Shaune Pollack, MD    Family History Family History  Problem Relation Age of Onset  . Family history unknown: Yes    Social History Social History  Substance Use Topics  . Smoking status: Never Smoker  . Smokeless tobacco: Never Used  . Alcohol use No     Allergies   Patient has no known allergies.   Review of Systems Review of Systems  Constitutional: Negative for chills and fever.  Respiratory: Negative.   Cardiovascular: Negative.   Gastrointestinal: Positive for abdominal pain (See HPI.). Negative for constipation, diarrhea, nausea and vomiting.  Genitourinary: Negative.  Negative for difficulty urinating, dysuria and enuresis.  Musculoskeletal: Positive for back pain.       See HPI.  Neurological: Negative.  Negative for weakness and numbness.     Physical Exam Updated Vital Signs BP 131/78 (BP Location: Left Arm)   Pulse 81   Temp 98.6 F (37 C) (Oral)   Resp 16   SpO2 99%   Physical Exam  Constitutional: She is oriented to person, place, and time. She appears well-developed and well-nourished.  Neck: Normal range of motion.  Cardiovascular: Intact distal pulses.    Pulmonary/Chest: Effort normal.  Abdominal: Soft. She exhibits no distension and no mass. There is no tenderness. There is no rebound and no guarding.  Musculoskeletal:  Lower back tenderness bilaterally. No swelling. FROM back and LE bilaterally.  Neurological: She is alert and oriented to person, place, and time. She displays normal reflexes. No sensory deficit. She exhibits normal muscle tone.  Skin: Skin is warm and dry.     ED Treatments / Results  Labs (all labs ordered are listed, but only abnormal results are displayed) Labs Reviewed - No data to display  EKG  EKG Interpretation None       Radiology No results found.  Procedures Procedures (including critical care time)  Medications Ordered in ED Medications - No data to display   Initial Impression / Assessment and Plan / ED Course  I have reviewed the triage vital signs and the nursing notes.  Pertinent labs & imaging results that were available during my care of the patient were reviewed by me and  considered in my medical decision making (see chart for details).     Patient with history of chronic back pain presents with back pain.   Chart reviewed. The patient has been seen multiple times for same complaint. She has been prescribed Voltaren Gel, muscle relaxer and Naproxen. She reports also being seen by her primary care provider today and was told to continue these medications.   No neurologic red flags. Patient having acute on chronic back pain that she reports is unchanged. Encouraged to continue medications and have close follow up with PCP if it continues.   Final Clinical Impressions(s) / ED Diagnoses   Final diagnoses:  None   1. Acute on chronic low back pain  New Prescriptions New Prescriptions   No medications on file     Danne HarborUpstill, Kamarius Buckbee, PA-C 07/26/17 2316    Lorre NickAllen, Anthony, MD 07/28/17 1342

## 2017-07-26 NOTE — Patient Instructions (Addendum)
Continue Diclofenac cream for back pain. You do not have a urinary trac infection and therefore do not fill the antibiotic. Urine culture was negative. Abdominal pain continue to hydrate with water, avoid spicy foods and or eating late at night. You have previously been prescribed omeprazole for acid reflux, I recommend that you resume this medication.

## 2017-07-26 NOTE — ED Triage Notes (Signed)
Per [facility] patient from home c/o abd pain and back pain that has been on going. Patient was prescribed antibiotics on 9th from here and was told today by PCP to stop taking them.

## 2017-07-26 NOTE — Progress Notes (Signed)
Patient ID: Felicia Collier, female    DOB: 1956/04/23, 61 y.o.   MRN: [id number]  PCP: Bing Neighbors, FNP  Chief Complaint  Patient presents with  . Back Pain  . Abdominal Pain    Subjective:  HPI Felicia Collier is a 61 y.o. female, with Schizophrenia,  presents for evaluation of chronic back an abdominal pain. Felicia Collier has presented to the ED on multiple occasions with these complaints. Most recent ED visit 07/24/2017, Felicia Collier was treated for UTI. Urine culture was contaminated and grew multiple species. Felicia Collier did not fill the prescription for antibiotic. Today she denies vaginal symptoms. Continues to complain of abdominal pain. Previously recommended a colonoscopy although she refuses to undergo the procedure. She was prescribed omeprazole for symptoms consistent with likely GERD. Felicia Collier has also been prescribed muscle relaxer and NSAID for back pain and inconsistently takes medication. Felicia Collier denies vaginal irritation or dysuria today.  Social History   Social History  . Marital status: Single    Spouse name: N/A  . Number of children: N/A  . Years of education: N/A   Occupational History  . Not on file.   Social History Main Topics  . Smoking status: Never Smoker  . Smokeless tobacco: Never Used  . Alcohol use No  . Drug use: No  . Sexual activity: Not on file   Other Topics Concern  . Not on file   Social History Narrative  . No narrative on file    Family History  Problem Relation Age of Onset  . Family history unknown: Yes   Review of Systems See HPI Patient Active Problem List   Diagnosis Date Noted  . Pain in left wrist 07/20/2017  . Schizophrenia (HCC) 05/01/2017  . Chronic pain of left knee 02/09/2017  . Unilateral primary osteoarthritis, left knee 02/09/2017  . Neuropathy 11/22/2016  . Gastroesophageal reflux disease without esophagitis 11/22/2016  . Weight gain 11/22/2016  . Atypical chest pain 11/04/2016    No Known Allergies  Prior to Admission  medications   Medication Sig Start Date End Date Taking? Authorizing Provider  cyclobenzaprine (FLEXERIL) 10 MG tablet Take 1 tablet (10 mg total) by mouth 2 (two) times daily as needed for muscle spasms. 07/21/17  Yes Audry Pili, PA-C  diclofenac sodium (VOLTAREN) 1 % GEL Apply topically 4 (four) times daily.   Yes [provider]  naproxen (EC NAPROSYN) 500 MG EC tablet Take 1 tablet (500 mg total) by mouth 2 (two) times daily with a meal. 07/24/17 07/24/18 Yes Nedrud, Jeanella Flattery, MD  nitrofurantoin, macrocrystal-monohydrate, (MACROBID) 100 MG capsule Take 1 capsule (100 mg total) by mouth 2 (two) times daily. 07/24/17 07/27/17 Yes Nedrud, Jeanella Flattery, MD  dicyclomine (BENTYL) 20 MG tablet Take 1 tablet (20 mg total) by mouth 2 (two) times daily. Patient not taking: Reported on 06/24/2017 04/13/17   Bing Neighbors, FNP  dicyclomine (BENTYL) 20 MG tablet Take 1 tablet (20 mg total) by mouth 2 (two) times daily. Patient not taking: Reported on 06/16/2017 04/13/17   Bing Neighbors, FNP  Estradiol (ESTROGEL) 0.75 MG/1.25 GM (0.06%) topical gel Place 1.25 g onto the skin daily. Patient not taking: Reported on 07/24/2017 06/16/17   Mackuen, Cindee Salt, MD  gabapentin (NEURONTIN) 300 MG capsule Take 1 capsule (300 mg total) by mouth 3 (three) times daily. Patient not taking: Reported on 07/24/2017 05/31/17   Bing Neighbors, FNP  haloperidol decanoate (HALDOL DECANOATE) 50 MG/ML injection Inject 50 mg into the muscle every 28 (twenty-eight) days.  [provider]  hydrocortisone (ANUSOL-HC) 2.5 % rectal cream Place 1 application rectally 2 (two) times daily. Patient not taking: Reported on 07/24/2017 06/24/17   Bing NeighborsHarris, Ferris Fielden S, FNP  hydrocortisone (ANUSOL-HC) 25 MG suppository Place 1 suppository (25 mg total) rectally 2 (two) times daily. For 7 days Patient not taking: Reported on 06/24/2017 06/09/17   Mancel BaleWentz, Elliott, MD  hydrocortisone cream 1 % Apply 1 application topically 2 (two) times  daily. Do not apply to face Patient not taking: Reported on 06/16/2017 04/12/17   Arthor CaptainHarris, Abigail, PA-C  ibuprofen (ADVIL,MOTRIN) 600 MG tablet Take 1 tablet (600 mg total) by mouth every 6 (six) hours as needed. Patient not taking: Reported on 07/26/2017 07/21/17   Audry PiliMohr, Tyler, PA-C  methocarbamol (ROBAXIN) 500 MG tablet Take 1 tablet (500 mg total) by mouth 2 (two) times daily. Patient not taking: Reported on 06/16/2017 04/13/17   Bing NeighborsHarris, Mirel Hundal S, FNP  omeprazole (PRILOSEC) 40 MG capsule Take 1 capsule (40 mg total) by mouth daily. Patient not taking: Reported on 06/16/2017 03/09/17   Bing NeighborsHarris, Seraj Dunnam S, FNP  ondansetron (ZOFRAN ODT) 4 MG disintegrating tablet Take 1 tablet (4 mg total) by mouth every 8 (eight) hours as needed for nausea or vomiting. Patient not taking: Reported on 07/24/2017 06/29/17   Shaune PollackIsaacs, Cameron, MD  pantoprazole (PROTONIX) 20 MG tablet Take 1 tablet (20 mg total) by mouth daily. Patient not taking: Reported on 07/24/2017 06/29/17 07/13/17  Shaune PollackIsaacs, Cameron, MD  potassium chloride (K-DUR) 10 MEQ tablet Take 1 tablet (10 mEq total) by mouth daily. Patient not taking: Reported on 07/24/2017 03/02/17   Bing NeighborsHarris, Letty Salvi S, FNP  sucralfate (CARAFATE) 1 GM/10ML suspension Take 10 mLs (1 g total) by mouth 4 (four) times daily -  with meals and at bedtime. Patient not taking: Reported on 07/26/2017 06/29/17   Shaune PollackIsaacs, Cameron, MD    Past Medical, Surgical Family and Social History reviewed and updated.    Objective:   Today's Vitals   07/26/17 0756  BP: 120/66  Pulse: 76  Resp: 14  Temp: 98.1 F (36.7 C)  TempSrc: Oral  SpO2: 98%  Weight: 163 lb 6.4 oz (74.1 kg)  Height: 5\' 5"  (1.651 m)    Wt Readings from Last 3 Encounters:  07/26/17 163 lb 6.4 oz (74.1 kg)  06/29/17 161 lb (73 kg)  06/24/17 163 lb 6.4 oz (74.1 kg)    Physical Exam  Constitutional: She is oriented to person, place, and time. She appears well-developed and well-nourished.  HENT:  Head: Normocephalic and  atraumatic.  Eyes: Pupils are equal, round, and reactive to light. Conjunctivae and EOM are normal.  Neck: Neck supple.  Cardiovascular: Normal rate, regular rhythm, normal heart sounds and intact distal pulses.   Pulmonary/Chest: Effort normal and breath sounds normal.  Abdominal: Soft. Bowel sounds are normal.  Musculoskeletal: Normal range of motion.  Neurological: She is alert and oriented to person, place, and time.  Skin: Skin is warm and dry.  Psychiatric: She has a normal mood and affect. Her behavior is normal. Judgment and thought content normal.   Assessment & Plan:  1. Chronic bilateral back pain, unspecified back location -Resume Diclofenac gel for back pain  2. Generalized abdominal pain -Resume omeprazole and increase water intake , UA only significant for small leukocytes. She never started antibiotics previously prescribed. Advised not to fill prescription. Recent urine culture contaminated and patient has no associated dysuria or UTI symptoms.  RTC:  3 months for routine follow-up   Godfrey PickKimberly S.  Kenton Kingfisher, MSN, FNP-C [facility]  [address] [place], [place] [address] [phone]

## 2017-07-28 ENCOUNTER — Telehealth: Payer: Self-pay

## 2017-07-28 NOTE — Telephone Encounter (Signed)
Tried to call patient number not valid

## 2017-07-31 ENCOUNTER — Encounter (HOSPITAL_COMMUNITY): Payer: Self-pay | Admitting: Emergency Medicine

## 2017-07-31 ENCOUNTER — Emergency Department (HOSPITAL_COMMUNITY)
Admission: EM | Admit: 2017-07-31 | Discharge: 2017-07-31 | Disposition: A | Discharge status: Home / Self Care | Payer: Medicaid Other | Attending: Emergency Medicine | Admitting: Emergency Medicine

## 2017-07-31 DIAGNOSIS — Z79899 Other long term (current) drug therapy: Secondary | ICD-10-CM | POA: Diagnosis not present

## 2017-07-31 DIAGNOSIS — N390 Urinary tract infection, site not specified: Secondary | ICD-10-CM | POA: Diagnosis not present

## 2017-07-31 DIAGNOSIS — M545 Low back pain: Secondary | ICD-10-CM | POA: Diagnosis present

## 2017-07-31 LAB — COMPREHENSIVE METABOLIC PANEL
ALBUMIN: 3.7 g/dL (ref 3.5–5.0)
ALT: 15 U/L (ref 14–54)
ANION GAP: 8 (ref 5–15)
AST: 17 U/L (ref 15–41)
Alkaline Phosphatase: 111 U/L (ref 38–126)
BUN: 10 mg/dL (ref 6–20)
CALCIUM: 9.1 mg/dL (ref 8.9–10.3)
CO2: 26 mmol/L (ref 22–32)
Chloride: 104 mmol/L (ref 101–111)
Creatinine, Ser: 0.62 mg/dL (ref 0.44–1.00)
GFR calc Af Amer: >60 mL/min (ref >60)
GLUCOSE: 103 mg/dL — AB (ref 65–99)
POTASSIUM: 3.5 mmol/L (ref 3.5–5.1)
Sodium: 138 mmol/L (ref 135–145)
TOTAL PROTEIN: 7.2 g/dL (ref 6.5–8.1)
Total Bilirubin: 0.4 mg/dL (ref 0.3–1.2)

## 2017-07-31 LAB — URINALYSIS, ROUTINE W REFLEX MICROSCOPIC
Bilirubin Urine: NEGATIVE
GLUCOSE, UA: NEGATIVE mg/dL
HGB URINE DIPSTICK: NEGATIVE
Ketones, ur: NEGATIVE mg/dL
NITRITE: NEGATIVE
PH: 6 (ref 5.0–8.0)
Protein, ur: NEGATIVE mg/dL
SPECIFIC GRAVITY, URINE: 1.011 (ref 1.005–1.030)

## 2017-07-31 LAB — CBC
HEMATOCRIT: 39.5 % (ref 36.0–46.0)
Hemoglobin: 12.5 g/dL (ref 12.0–15.0)
MCH: 25.7 pg — ABNORMAL LOW (ref 26.0–34.0)
MCHC: 31.6 g/dL (ref 30.0–36.0)
MCV: 81.1 fL (ref 78.0–100.0)
Platelets: 322 10*3/uL (ref 150–400)
RBC: 4.87 MIL/uL (ref 3.87–5.11)
RDW: 14.2 % (ref 11.5–15.5)
WBC: 13.4 10*3/uL — AB (ref 4.0–10.5)

## 2017-07-31 LAB — LIPASE, BLOOD: LIPASE: 29 U/L (ref 11–51)

## 2017-07-31 MED ORDER — FLUCONAZOLE 150 MG PO TABS
150.0000 mg | ORAL_TABLET | Freq: Once | ORAL | Status: AC
  Administered 2017-07-31: 150 mg via ORAL
  Filled 2017-07-31: qty 1

## 2017-07-31 MED ORDER — CEPHALEXIN 500 MG PO CAPS: 500.0000 mg | ORAL_CAPSULE | Freq: Four times a day (QID) | ORAL | 0 refills | Status: DC

## 2017-07-31 MED ORDER — CEPHALEXIN 500 MG PO CAPS
500.0000 mg | ORAL_CAPSULE | Freq: Once | ORAL | Status: AC
  Administered 2017-07-31: 500 mg via ORAL
  Filled 2017-07-31: qty 1

## 2017-07-31 NOTE — ED Notes (Signed)
Requested urine from patient. 

## 2017-07-31 NOTE — Discharge Instructions (Signed)
It was my pleasure taking care of you today!   Take your antibiotic as directed.   It is very important that you follow up with your primary care provider.   Tylenol or ibuprofen as needed for pain.   Please seek immediate care if you develop the following: There is severe back pain or lower abdominal pain.  You develop chills.  You have a fever.  There is nausea or vomiting.  There is continued burning or discomfort with urination.  You have any additional concerns.

## 2017-07-31 NOTE — ED Provider Notes (Signed)
[facility]-EMERGENCY DEPT Provider Note   CSN: [id number] Arrival date & time: 07/31/17  0017     History   Chief Complaint Chief Complaint  Patient presents with  . Back Pain  . Abdominal Pain    HPI Felicia Collier is a 61 y.o. female.  The history is provided by the patient and medical records. No language interpreter was used.  Back Pain   Associated symptoms include abdominal pain.  Abdominal Pain   Pertinent negatives include diarrhea, nausea, vomiting and constipation.   Felicia Collier is a 61 y.o. female  with a PMH of chronic back pain, schizophrenia who presents to the Emergency Department complaining of low back pain x 3 months. She states that she will come to ER and get "pills" (unsure of the names of medication) which help but when she runs out, her pain returns. Patient denies upper back or neck pain. No fever, saddle anesthesia, weakness, numbness, urinary complaints including retention/incontinence. No history of cancer, IVDU, or recent spinal procedures. Patient also complaining of suprapubic abdominal pain which began yesterday morning and associated with dysuria. She was seen in ED on 9/09 where she was given rx for macrobid for possible UTI. She does not believe that she took these medications. She saw PCP 2 days later where urine was not infectious. Culture came back and appears to have contaminant with multiple species present. No n/v/d or constipation. No vaginal discharge or pelvic pain.   Past Medical History:  Diagnosis Date  . Chronic knee pain   . Schizophrenia [facility])     Patient Active Problem List   Diagnosis Date Noted  . Pain in left wrist 07/20/2017  . Schizophrenia (HCC) 05/01/2017  . Chronic pain of left knee 02/09/2017  . Unilateral primary osteoarthritis, left knee 02/09/2017  . Neuropathy 11/22/2016  . Gastroesophageal reflux disease without esophagitis 11/22/2016  . Weight gain 11/22/2016  . Atypical chest pain 11/04/2016    Past Surgical  History:  Procedure Laterality Date  . CHOLECYSTECTOMY      OB History    No data available       Home Medications    Prior to Admission medications   Medication Sig Start Date End Date Taking? Authorizing Provider  cyclobenzaprine (FLEXERIL) 10 MG tablet Take 1 tablet (10 mg total) by mouth 2 (two) times daily as needed for muscle spasms. 07/21/17  Yes Audry Pili, PA-C  cephALEXin (KEFLEX) 500 MG capsule Take 1 capsule (500 mg total) by mouth 4 (four) times daily. 07/31/17   Stacey Sago, Chase Picket, PA-C  dicyclomine (BENTYL) 20 MG tablet Take 1 tablet (20 mg total) by mouth 2 (two) times daily. Patient not taking: Reported on 06/24/2017 04/13/17   Bing Neighbors, FNP  dicyclomine (BENTYL) 20 MG tablet Take 1 tablet (20 mg total) by mouth 2 (two) times daily. Patient not taking: Reported on 06/16/2017 04/13/17   Bing Neighbors, FNP  Estradiol (ESTROGEL) 0.75 MG/1.25 GM (0.06%) topical gel Place 1.25 g onto the skin daily. Patient not taking: Reported on 07/24/2017 06/16/17   Mackuen, Cindee Salt, MD  gabapentin (NEURONTIN) 300 MG capsule Take 1 capsule (300 mg total) by mouth 3 (three) times daily. Patient not taking: Reported on 07/24/2017 05/31/17   Bing Neighbors, FNP  haloperidol decanoate (HALDOL DECANOATE) 50 MG/ML injection Inject 50 mg into the muscle every 28 (twenty-eight) days.    [provider]  hydrocortisone (ANUSOL-HC) 2.5 % rectal cream Place 1 application rectally 2 (two) times daily. Patient not taking:  Reported on 07/24/2017 06/24/17   Bing Neighbors, FNP  hydrocortisone (ANUSOL-HC) 25 MG suppository Place 1 suppository (25 mg total) rectally 2 (two) times daily. For 7 days Patient not taking: Reported on 06/24/2017 06/09/17   Mancel Bale, MD  hydrocortisone cream 1 % Apply 1 application topically 2 (two) times daily. Do not apply to face Patient not taking: Reported on 06/16/2017 04/12/17   Arthor Captain, PA-C  ibuprofen (ADVIL,MOTRIN) 600 MG tablet Take 1  tablet (600 mg total) by mouth every 6 (six) hours as needed. Patient not taking: Reported on 07/26/2017 07/21/17   Audry Pili, PA-C  methocarbamol (ROBAXIN) 500 MG tablet Take 1 tablet (500 mg total) by mouth 2 (two) times daily. Patient not taking: Reported on 06/16/2017 04/13/17   Bing Neighbors, FNP  naproxen (EC NAPROSYN) 500 MG EC tablet Take 1 tablet (500 mg total) by mouth 2 (two) times daily with a meal. Patient not taking: Reported on 07/26/2017 07/24/17 07/24/18  Rozann Lesches, MD  omeprazole (PRILOSEC) 40 MG capsule Take 1 capsule (40 mg total) by mouth daily. Patient not taking: Reported on 06/16/2017 03/09/17   Bing Neighbors, FNP  ondansetron (ZOFRAN ODT) 4 MG disintegrating tablet Take 1 tablet (4 mg total) by mouth every 8 (eight) hours as needed for nausea or vomiting. Patient not taking: Reported on 07/24/2017 06/29/17   Shaune Pollack, MD  pantoprazole (PROTONIX) 20 MG tablet Take 1 tablet (20 mg total) by mouth daily. Patient not taking: Reported on 07/24/2017 06/29/17 07/13/17  Shaune Pollack, MD  potassium chloride (K-DUR) 10 MEQ tablet Take 1 tablet (10 mEq total) by mouth daily. Patient not taking: Reported on 07/24/2017 03/02/17   Bing Neighbors, FNP  sucralfate (CARAFATE) 1 GM/10ML suspension Take 10 mLs (1 g total) by mouth 4 (four) times daily -  with meals and at bedtime. Patient not taking: Reported on 07/26/2017 06/29/17   Shaune Pollack, MD    Family History Family History  Problem Relation Age of Onset  . Family history unknown: Yes    Social History Social History  Substance Use Topics  . Smoking status: Never Smoker  . Smokeless tobacco: Never Used  . Alcohol use No     Allergies   Patient has no known allergies.   Review of Systems Review of Systems  Gastrointestinal: Positive for abdominal pain. Negative for blood in stool, constipation, diarrhea, nausea and vomiting.  Musculoskeletal: Positive for back pain.  All other systems reviewed and are  negative.    Physical Exam Updated Vital Signs BP 140/84   Pulse 67   Temp 98.1 F (36.7 C) (Oral)   Resp 18   Ht  (1.651 m)   Wt 73.9 kg (163 lb)   SpO2 98%   BMI 27.12 kg/m   Physical Exam  Constitutional: She is oriented to person, place, and time. She appears well-developed and well-nourished. No distress.  HENT:  Head: Normocephalic and atraumatic.  Cardiovascular: Normal rate, regular rhythm and normal heart sounds.   No murmur heard. Pulmonary/Chest: Effort normal and breath sounds normal. No respiratory distress.  Abdominal: Soft. She exhibits no distension.  No abdominal or CVA tenderness.  Musculoskeletal:       Arms: No midline spinal tenderness. Full ROM without pain. Straight leg raises negative bilaterally. 5/5 muscle strength in lower extremities.  Neurological: She is alert and oriented to person, place, and time.  Bilateral lower extremities neurovascularly intact.  Skin: Skin is warm and dry.  Nursing note and  vitals reviewed.    ED Treatments / Results  Labs (all labs ordered are listed, but only abnormal results are displayed) Labs Reviewed  COMPREHENSIVE METABOLIC PANEL - Abnormal; Notable for the following:       Result Value   Glucose, Bld 103 (*)    All other components within normal limits  CBC - Abnormal; Notable for the following:    WBC 13.4 (*)    MCH 25.7 (*)    All other components within normal limits  URINALYSIS, ROUTINE W REFLEX MICROSCOPIC - Abnormal; Notable for the following:    APPearance HAZY (*)    Leukocytes, UA LARGE (*)    Bacteria, UA RARE (*)    Squamous Epithelial / LPF 0-5 (*)    All other components within normal limits  URINE CULTURE  LIPASE, BLOOD    EKG  EKG Interpretation None       Radiology No results found.  Procedures Procedures (including critical care time)  Medications Ordered in ED Medications  fluconazole (DIFLUCAN) tablet 150 mg (150 mg Oral Given 07/31/17 0633)  cephALEXin  (KEFLEX) capsule 500 mg (500 mg Oral Given 07/31/17 9379)     Initial Impression / Assessment and Plan / ED Course  I have reviewed the triage vital signs and the nursing notes.  Pertinent labs & imaging results that were available during my care of the patient were reviewed by me and considered in my medical decision making (see chart for details).    Felicia Collier is a 61 y.o. female who presents to ED for low back pain. No midline tenderness or red flag symptoms of back pain. UA with TNTC WBC's and large leuks. She is experiencing dysuria. Yeast in the urine as well. Diflucan given in ED and will treat UTI. Urine cx sent. Here for similar recently but patient unsure if she took ABX. She is a very poor historian. PCP follow up strongly recommended. Return precautions discussed. All questions answered.    Final Clinical Impressions(s) / ED Diagnoses   Final diagnoses:  Lower urinary tract infection    New Prescriptions Discharge Medication List as of 07/31/2017  6:39 AM    START taking these medications   Details  cephALEXin (KEFLEX) 500 MG capsule Take 1 capsule (500 mg total) by mouth 4 (four) times daily., Starting Sun 07/31/2017, Print         Cataldo Cosgriff, Chase Picket, PA-C 07/31/17 0240    Nicanor Alcon, April, MD 08/01/17 0130

## 2017-07-31 NOTE — ED Triage Notes (Signed)
Patient complaining of lower abdominal pain and lower back pain. Patient states she has been coming for these pains and the doctors have not cured it yet.

## 2017-08-02 LAB — URINE CULTURE

## 2017-08-09 ENCOUNTER — Encounter (HOSPITAL_COMMUNITY): Payer: Self-pay

## 2017-08-09 ENCOUNTER — Emergency Department (HOSPITAL_COMMUNITY)
Admission: EM | Admit: 2017-08-09 | Discharge: 2017-08-09 | Discharge status: Against Medical Advice | Payer: Medicaid Other | Attending: Emergency Medicine | Admitting: Emergency Medicine

## 2017-08-09 DIAGNOSIS — M549 Dorsalgia, unspecified: Secondary | ICD-10-CM | POA: Diagnosis present

## 2017-08-09 DIAGNOSIS — Z5321 Procedure and treatment not carried out due to patient leaving prior to being seen by health care provider: Secondary | ICD-10-CM | POA: Insufficient documentation

## 2017-08-09 NOTE — ED Triage Notes (Signed)
Pt c/o lower back pain x 1 month.  Pain score 10/10.  Pt reports "using a cream and green pills" without relief.  Pt has been seen a couple times for same.  Sts she was lifting a tv x 1 month ago and "felt a pop."  Pt ambulated to triage room w/o difficulty.

## 2017-08-09 NOTE — ED Notes (Signed)
Did not respond 

## 2017-08-09 NOTE — ED Notes (Signed)
No responce 

## 2017-08-11 ENCOUNTER — Emergency Department (HOSPITAL_COMMUNITY): Payer: Medicaid Other

## 2017-08-11 ENCOUNTER — Emergency Department (HOSPITAL_COMMUNITY)
Admission: EM | Admit: 2017-08-11 | Discharge: 2017-08-11 | Disposition: A | Discharge status: Home / Self Care | Payer: Medicaid Other | Attending: Emergency Medicine | Admitting: Emergency Medicine

## 2017-08-11 ENCOUNTER — Encounter (HOSPITAL_COMMUNITY): Payer: Self-pay

## 2017-08-11 DIAGNOSIS — S32040A Wedge compression fracture of fourth lumbar vertebra, initial encounter for closed fracture: Secondary | ICD-10-CM | POA: Diagnosis not present

## 2017-08-11 DIAGNOSIS — Y9389 Activity, other specified: Secondary | ICD-10-CM | POA: Diagnosis not present

## 2017-08-11 DIAGNOSIS — X500XXA Overexertion from strenuous movement or load, initial encounter: Secondary | ICD-10-CM | POA: Insufficient documentation

## 2017-08-11 DIAGNOSIS — G8929 Other chronic pain: Secondary | ICD-10-CM

## 2017-08-11 DIAGNOSIS — M545 Low back pain: Secondary | ICD-10-CM

## 2017-08-11 DIAGNOSIS — Y999 Unspecified external cause status: Secondary | ICD-10-CM | POA: Insufficient documentation

## 2017-08-11 DIAGNOSIS — Y929 Unspecified place or not applicable: Secondary | ICD-10-CM | POA: Insufficient documentation

## 2017-08-11 DIAGNOSIS — S3992XA Unspecified injury of lower back, initial encounter: Secondary | ICD-10-CM | POA: Diagnosis present

## 2017-08-11 LAB — URINALYSIS, ROUTINE W REFLEX MICROSCOPIC
BILIRUBIN URINE: NEGATIVE
Bacteria, UA: NONE SEEN
Glucose, UA: NEGATIVE mg/dL
HGB URINE DIPSTICK: NEGATIVE
Ketones, ur: NEGATIVE mg/dL
NITRITE: NEGATIVE
PH: 7 (ref 5.0–8.0)
Protein, ur: NEGATIVE mg/dL
SPECIFIC GRAVITY, URINE: 1.001 — AB (ref 1.005–1.030)
Squamous Epithelial / LPF: NONE SEEN

## 2017-08-11 MED ORDER — LIDOCAINE 5 % EX PTCH: 1.0000 | MEDICATED_PATCH | CUTANEOUS | 0 refills | Status: DC

## 2017-08-11 NOTE — Discharge Instructions (Signed)
Please see the information and instructions below regarding your visit.  Your diagnoses today include:  1. Chronic bilateral low back pain, with sciatica presence unspecified   2. Closed compression fracture of fourth lumbar vertebra, initial encounter (HCC)    Most episodes of acute low back pain are self-limited. Your exam was reassuring today that the source of your pain is not affecting the spinal cord and nerves that originate in the spinal cord. We did note a new compression fracture that is STABLE in your lower back. Your back x-ray is also noting that your bones are thinning. This is called osteoporosis. We would like you to follow up with your primary care provider about this.  Tests performed today include: See side panel of your discharge paperwork for testing performed today. Vital signs are listed at the bottom of these instructions.   Medications prescribed:    Take any prescribed medications only as prescribed, and any over the counter medications only as directed on the packaging.  Home care instructions:   Low back pain gets worse the longer you stay stationary. Please keep moving and walking as tolerated. There are exercises included in this packet to perform as tolerated for your low back pain.   Apply heat to the areas that are painful. Avoid twisting or bending your trunk to lift something. Do not lift anything above 25 lbs while recovering from this flare of low back pain.  Please follow any educational materials contained in this packet.   Follow-up instructions: Please follow-up with your primary care provider as soon as possible for further evaluation of your symptoms if they are not completely improved.   Please follow up with Dr. Rayburn Ma in as soon as possible.  Return instructions:  Please return to the Emergency Department if you experience worsening symptoms.  Please return for any fever or chills in the setting of your back pain, weakness in the muscles  of the legs, numbness in your legs and feet that is new or changing, numbness in the area where you wipe, retention of your urine, loss of bowel or bladder control, or problems with walking. Please return if you have any other emergent concerns.  Additional Information:   Your vital signs today were: BP (!) 135/97    Pulse 74    Temp 98.4 F (36.9 C)    Resp 14    Ht  (1.626 m)    Wt 73.9 kg (163 lb)    SpO2 96%    BMI 27.98 kg/m  If your blood pressure (BP) was elevated on multiple readings during this visit above 130 for the top number or above 80 for the bottom number, please have this repeated by your primary care provider within one month. --------------  Thank you for allowing [place] to participate in your care today.

## 2017-08-11 NOTE — ED Triage Notes (Signed)
Pt c/o lower back pain x3 months after lifting a TV and heard a crack. Pt states being prescribed cream and pills and it hasn't worked.Pt ambulatory to triage room.

## 2017-08-11 NOTE — ED Provider Notes (Signed)
[facility]-EMERGENCY DEPT Provider Note   CSN: [id number] Arrival date & time: 08/11/17  1116     History   Chief Complaint Chief Complaint  Patient presents with  . Back Pain    HPI Felicia Collier is a 61 y.o. female.  HPI   Felicia Collier is a 61 year old female with a history of low back pain, chronic knee pain, osteoarthritis, and schizophrenia presenting for 4 month history of low back paraspinal muscular pain that extends around to bilateral flanks. Patient links her pain to an episode a couple of months ago where she was lifting a TV and felt a "crack" in her back. Patient reports that this is largely unchanged, however it is interfering with her ability to sleep. Patient reports she has been taking Robaxin and naproxen without complete relief. Patient has also tried "cream, "pills," a liquid". Patient reports that pain can be up to a 10 out of 10 in severity. Patient is reporting weakness in her left knee But no muscular weakness of lower extremities, new numbness, saddle anesthesia, loss of bowel or bladder control. No fever or chills. No nausea, vomiting, or diarrhea. Last vomiting yesterday and normal without melena or hematochezia. No dysuria or vaginal discharge. Patient denies any IVDU. Patient was treated for colon cancer in 1994. Patient's only recent spinal procedure was in epidural steroid injection in July or August. Patient is requesting a back brace today. Patient's regular followed by primary care provider and orthopedic physician.  Past Medical History:  Diagnosis Date  . Chronic knee pain   . Schizophrenia [facility])     Patient Active Problem List   Diagnosis Date Noted  . Pain in left wrist 07/20/2017  . Schizophrenia (HCC) 05/01/2017  . Chronic pain of left knee 02/09/2017  . Unilateral primary osteoarthritis, left knee 02/09/2017  . Neuropathy 11/22/2016  . Gastroesophageal reflux disease without esophagitis 11/22/2016  . Weight gain 11/22/2016  . Atypical chest pain  11/04/2016    Past Surgical History:  Procedure Laterality Date  . CHOLECYSTECTOMY      OB History    No data available       Home Medications    Prior to Admission medications   Medication Sig Start Date End Date Taking? Authorizing Provider  cephALEXin (KEFLEX) 500 MG capsule Take 1 capsule (500 mg total) by mouth 4 (four) times daily. 07/31/17   Ward, Chase Picket, PA-C  cyclobenzaprine (FLEXERIL) 10 MG tablet Take 1 tablet (10 mg total) by mouth 2 (two) times daily as needed for muscle spasms. 07/21/17   Audry Pili, PA-C  dicyclomine (BENTYL) 20 MG tablet Take 1 tablet (20 mg total) by mouth 2 (two) times daily. Patient not taking: Reported on 06/24/2017 04/13/17   Bing Neighbors, FNP  dicyclomine (BENTYL) 20 MG tablet Take 1 tablet (20 mg total) by mouth 2 (two) times daily. Patient not taking: Reported on 06/16/2017 04/13/17   Bing Neighbors, FNP  Estradiol (ESTROGEL) 0.75 MG/1.25 GM (0.06%) topical gel Place 1.25 g onto the skin daily. Patient not taking: Reported on 07/24/2017 06/16/17   Mackuen, Cindee Salt, MD  gabapentin (NEURONTIN) 300 MG capsule Take 1 capsule (300 mg total) by mouth 3 (three) times daily. Patient not taking: Reported on 07/24/2017 05/31/17   Bing Neighbors, FNP  haloperidol decanoate (HALDOL DECANOATE) 50 MG/ML injection Inject 50 mg into the muscle every 28 (twenty-eight) days.    [provider]  hydrocortisone (ANUSOL-HC) 2.5 % rectal cream Place 1 application rectally 2 (two) times  daily. Patient not taking: Reported on 07/24/2017 06/24/17   Bing Neighbors, FNP  hydrocortisone (ANUSOL-HC) 25 MG suppository Place 1 suppository (25 mg total) rectally 2 (two) times daily. For 7 days Patient not taking: Reported on 06/24/2017 06/09/17   Mancel Bale, MD  hydrocortisone cream 1 % Apply 1 application topically 2 (two) times daily. Do not apply to face Patient not taking: Reported on 06/16/2017 04/12/17   Arthor Captain, PA-C  ibuprofen  (ADVIL,MOTRIN) 600 MG tablet Take 1 tablet (600 mg total) by mouth every 6 (six) hours as needed. Patient not taking: Reported on 07/26/2017 07/21/17   Audry Pili, PA-C  methocarbamol (ROBAXIN) 500 MG tablet Take 1 tablet (500 mg total) by mouth 2 (two) times daily. Patient not taking: Reported on 06/16/2017 04/13/17   Bing Neighbors, FNP  naproxen (EC NAPROSYN) 500 MG EC tablet Take 1 tablet (500 mg total) by mouth 2 (two) times daily with a meal. Patient not taking: Reported on 07/26/2017 07/24/17 07/24/18  Rozann Lesches, MD  omeprazole (PRILOSEC) 40 MG capsule Take 1 capsule (40 mg total) by mouth daily. Patient not taking: Reported on 06/16/2017 03/09/17   Bing Neighbors, FNP  ondansetron (ZOFRAN ODT) 4 MG disintegrating tablet Take 1 tablet (4 mg total) by mouth every 8 (eight) hours as needed for nausea or vomiting. Patient not taking: Reported on 07/24/2017 06/29/17   Shaune Pollack, MD  pantoprazole (PROTONIX) 20 MG tablet Take 1 tablet (20 mg total) by mouth daily. Patient not taking: Reported on 07/24/2017 06/29/17 07/13/17  Shaune Pollack, MD  potassium chloride (K-DUR) 10 MEQ tablet Take 1 tablet (10 mEq total) by mouth daily. Patient not taking: Reported on 07/24/2017 03/02/17   Bing Neighbors, FNP  sucralfate (CARAFATE) 1 GM/10ML suspension Take 10 mLs (1 g total) by mouth 4 (four) times daily -  with meals and at bedtime. Patient not taking: Reported on 07/26/2017 06/29/17   Shaune Pollack, MD    Family History Family History  Problem Relation Age of Onset  . Family history unknown: Yes    Social History Social History  Substance Use Topics  . Smoking status: Never Smoker  . Smokeless tobacco: Never Used  . Alcohol use No     Allergies   Patient has no known allergies.   Review of Systems Review of Systems  Constitutional: Negative for chills and fever.  Gastrointestinal: Positive for abdominal pain. Negative for blood in stool, constipation, diarrhea, nausea and  vomiting.  Genitourinary: Negative for dysuria and vaginal discharge.  Musculoskeletal: Positive for arthralgias and back pain.  Neurological: Negative for weakness and numbness.     Physical Exam Updated Vital Signs BP (!) 135/97   Pulse 74   Temp 98.4 F (36.9 C)   Resp 14   Ht  (1.626 m)   Wt 73.9 kg (163 lb)   SpO2 96%   BMI 27.98 kg/m   Physical Exam  Constitutional: She appears well-developed and well-nourished. No distress.  Sitting comfortably in bed.  HENT:  Head: Normocephalic and atraumatic.  Eyes: Conjunctivae are normal. Right eye exhibits no discharge. Left eye exhibits no discharge.  EOMs normal to gross examination.  Neck: Normal range of motion.  Cardiovascular: Normal rate and regular rhythm.   Intact, 2+ radial pulse.  Pulmonary/Chest:  Normal respiratory effort. Patient converses comfortably. No audible wheeze or stridor.  Abdominal: She exhibits no distension. There is no tenderness. There is no guarding.  Musculoskeletal: Normal range of motion.  Neurological: She  is alert.  Spine Exam: Inspection/Palpation: No cervical, thoracic, lumbar midline tenderness. Paraspinal lumbar musculature tenderness. Strength: 5/5 throughout LE bilaterally (hip flexion/extension, adduction/abduction; knee flexion/extension; foot dorsiflexion/plantarflexion, inversion/eversion; great toe inversion) Sensation: Intact to light touch in proximal and distal LE bilaterally Patient ambulates with good coordination and without difficulty. Gait symmetric.  Skin: Skin is warm and dry. She is not diaphoretic.  Psychiatric: She has a normal mood and affect. Her behavior is normal. Judgment and thought content normal.  Nursing note and vitals reviewed.    ED Treatments / Results  Labs (all labs ordered are listed, but only abnormal results are displayed) Labs Reviewed  URINALYSIS, ROUTINE W REFLEX MICROSCOPIC - Abnormal; Notable for the following:       Result Value    Color, Urine COLORLESS (*)    Specific Gravity, Urine 1.001 (*)    Leukocytes, UA SMALL (*)    All other components within normal limits    EKG  EKG Interpretation None       Radiology No results found.  Procedures Procedures (including critical care time)  Medications Ordered in ED Medications - No data to display   Initial Impression / Assessment and Plan / ED Course  I have reviewed the triage vital signs and the nursing notes.  Pertinent labs & imaging results that were available during my care of the patient were reviewed by me and considered in my medical decision making (see chart for details).  Clinical Course as of Aug 12 30  Thu Aug 11, 2017  1320 Patient seen and evaluated. We'll proceed with lumbar spine x-ray. Likely discharge with Lidoderm patch. This was discussed with attending physician, Dr. Derwood Kaplan.  [AM]  1434 L4 compression fracture noted. Minimal disk height loss and considered stable. Will discuss refer for management to patient's regular orthopedic physician.  [AM]    Clinical Course User Index [AM] Elisha Ponder, PA-C    Final Clinical Impressions(s) / ED Diagnoses   Final diagnoses:  None   MDM  Ms. Borello is a 61 year old female with a history of low back pain, chronic knee pain, osteoarthritis, and schizophrenia presenting for 4 month history of low back paraspinal muscular pain that extends around to bilateral flanks. Patient denies any concerning symptoms suggestive of cauda equina requiring urgent imaging at this time such as loss of sensation in the lower extremities, lower extremity weakness, loss of bowel or bladder control, saddle anesthesia, urinary retention, fever/chills, IVDU. Remote history of cancer in 1994, no active cancer currently. No weakness on exam today.  Patient had market bone demineralization on x-ray. This is considered stable, and patient informed of results and given instructions to follow-up with her typical  orthopedic physician. X-ray demonstrated new L4 vertebral compression fracture with minimal loss of vertebral body height.  This may be contributing to patient's symptoms. Doubt pelvic or urinary pathology for patient's acute back pain, as patient denies urinary symptoms, has no evidence of infection on UA, history/pain not consistent with nephrolithiasis, and has no vaginal discharge.  Patient already taking Robaxin and naproxen. Lidoderm patches prescribed for symptomatic relief. Patient given strict return precautions for any symptoms indicating worsening neurologic function in the lower extremities.  New Prescriptions New Prescriptions   No medications on file     Delia Chimes 08/12/17 1610    Derwood Kaplan, MD 08/12/17 [phone]

## 2017-08-16 ENCOUNTER — Ambulatory Visit (INDEPENDENT_AMBULATORY_CARE_PROVIDER_SITE_OTHER): Payer: Medicaid Other | Admitting: Orthopaedic Surgery

## 2017-08-16 DIAGNOSIS — M1712 Unilateral primary osteoarthritis, left knee: Secondary | ICD-10-CM

## 2017-08-16 DIAGNOSIS — M25532 Pain in left wrist: Secondary | ICD-10-CM | POA: Diagnosis not present

## 2017-08-16 NOTE — Progress Notes (Signed)
The patient is well-known to our office. She has chronic pain syndrome and even when I saw her last on September 5 she went to the ER the next day due to back pain. She has schizophrenia and multiple other psychosocial issues. In September arthritis steroid injection her left wrist her left knee. There are no further recommendations for her. She is to have her wrist splint and we can put her in one today. She doesn't injections help but then just saw last long. Upon review of her chart she's been in the emergency room 7 times since my last visit with her.  On examination her left wrist does show some stiffness with limited range of motion secondary to an old injury. Her left knee shows well-healed arthroscopy portals from or some form an arthroscopic surgery on her some time ago. There is no knee effusion and slight varus malalignment and x-rays in the past of confirmed moderate arthritis of left knee.  Importantly she is not a candidate for any type of surgery: The surgery would cause significant amount of pain and she has no support in terms of what is needed postoperatively for therapy and having a good outcome. Her biggest concern is the fact that she still relies on the emergency room for much of her care. I've counseled her about this but upon review her chart I have done this before and she still is going to the emergency room. There is really nothing else to offer her. We'll try a new removal wrist splint today on the left side and I can certainly inject these areas again in 3 months but she is someone that I do not plan to perform the surgery on her and all the aforementioned issues.

## 2017-08-23 ENCOUNTER — Emergency Department (HOSPITAL_COMMUNITY)
Admission: EM | Admit: 2017-08-23 | Discharge: 2017-08-23 | Disposition: A | Discharge status: Home / Self Care | Payer: Medicaid Other | Attending: Emergency Medicine | Admitting: Emergency Medicine

## 2017-08-23 ENCOUNTER — Encounter (HOSPITAL_COMMUNITY): Payer: Self-pay | Admitting: Emergency Medicine

## 2017-08-23 ENCOUNTER — Emergency Department (HOSPITAL_COMMUNITY)
Admission: EM | Admit: 2017-08-23 | Discharge: 2017-08-23 | Disposition: A | Discharge status: Home / Self Care | Payer: Medicaid Other

## 2017-08-23 DIAGNOSIS — Z5321 Procedure and treatment not carried out due to patient leaving prior to being seen by health care provider: Secondary | ICD-10-CM | POA: Insufficient documentation

## 2017-08-23 DIAGNOSIS — M545 Low back pain: Secondary | ICD-10-CM | POA: Diagnosis present

## 2017-08-23 NOTE — ED Notes (Addendum)
Pt called for triage x 1, no answer. 

## 2017-08-23 NOTE — ED Notes (Signed)
No answer when called for vital signs 

## 2017-08-23 NOTE — ED Notes (Signed)
Called 2x for triage with no answer

## 2017-08-23 NOTE — ED Triage Notes (Signed)
Pt report low back pain x several months after injury lifting furniture

## 2017-08-23 NOTE — ED Notes (Signed)
No answer when called for vitals. 

## 2017-08-24 ENCOUNTER — Encounter (HOSPITAL_COMMUNITY): Payer: Self-pay | Admitting: Emergency Medicine

## 2017-08-24 DIAGNOSIS — Z79899 Other long term (current) drug therapy: Secondary | ICD-10-CM | POA: Diagnosis not present

## 2017-08-24 DIAGNOSIS — M545 Low back pain: Secondary | ICD-10-CM | POA: Diagnosis present

## 2017-08-24 DIAGNOSIS — G8929 Other chronic pain: Secondary | ICD-10-CM | POA: Diagnosis not present

## 2017-08-24 NOTE — ED Triage Notes (Signed)
Pt is c/o lower back pain  States pain is worse with bending and coughing  Pt denies injury

## 2017-08-25 ENCOUNTER — Emergency Department (HOSPITAL_COMMUNITY)
Admission: EM | Admit: 2017-08-25 | Discharge: 2017-08-25 | Disposition: A | Discharge status: Home / Self Care | Payer: Medicaid Other | Attending: Emergency Medicine | Admitting: Emergency Medicine

## 2017-08-25 DIAGNOSIS — G8929 Other chronic pain: Secondary | ICD-10-CM

## 2017-08-25 DIAGNOSIS — M545 Low back pain: Secondary | ICD-10-CM

## 2017-08-25 MED ORDER — MELOXICAM 15 MG PO TABS: 15.0000 mg | ORAL_TABLET | Freq: Every day | ORAL | 0 refills | Status: DC

## 2017-08-25 MED ORDER — METHOCARBAMOL 500 MG PO TABS: 500.0000 mg | ORAL_TABLET | Freq: Two times a day (BID) | ORAL | 0 refills | Status: DC

## 2017-08-25 NOTE — ED Provider Notes (Signed)
[facility]-EMERGENCY DEPT Provider Note   CSN: [id number] Arrival date & time: 08/24/17  2215     History   Chief Complaint Chief Complaint  Patient presents with  . Back Pain    HPI Felicia Collier is a 61 y.o. female.  Patient presents to the emergency department for evaluation of back pain. Patient has a history of chronic back pain. She reports that she has been experiencing an increase in her chronic low back pain over the last several days. Pain is across her lower back and worsens when she bends or twists. She denies direct injury. She does not have any radiation to the legs. No numbness, tingling, weakness of the lower extremities. No change in bowel or bladder function.      Past Medical History:  Diagnosis Date  . Chronic knee pain   . Schizophrenia [facility])     Patient Active Problem List   Diagnosis Date Noted  . Pain in left wrist 07/20/2017  . Schizophrenia (HCC) 05/01/2017  . Chronic pain of left knee 02/09/2017  . Unilateral primary osteoarthritis, left knee 02/09/2017  . Neuropathy 11/22/2016  . Gastroesophageal reflux disease without esophagitis 11/22/2016  . Weight gain 11/22/2016  . Atypical chest pain 11/04/2016    Past Surgical History:  Procedure Laterality Date  . CHOLECYSTECTOMY      OB History    No data available       Home Medications    Prior to Admission medications   Medication Sig Start Date End Date Taking? Authorizing Provider  lidocaine (LIDODERM) 5 % Place 1 patch onto the skin daily. Remove & Discard patch within 12 hours or as directed by MD 08/11/17  Yes Aviva Kluver B, PA-C  Estradiol (ESTROGEL) 0.75 MG/1.25 GM (0.06%) topical gel Place 1.25 g onto the skin daily. Patient not taking: Reported on 07/24/2017 06/16/17   Mackuen, Cindee Salt, MD  gabapentin (NEURONTIN) 300 MG capsule Take 1 capsule (300 mg total) by mouth 3 (three) times daily. Patient not taking: Reported on 07/24/2017 05/31/17   Bing Neighbors, FNP    hydrocortisone (ANUSOL-HC) 2.5 % rectal cream Place 1 application rectally 2 (two) times daily. Patient not taking: Reported on 07/24/2017 06/24/17   Bing Neighbors, FNP  hydrocortisone (ANUSOL-HC) 25 MG suppository Place 1 suppository (25 mg total) rectally 2 (two) times daily. For 7 days Patient not taking: Reported on 06/24/2017 06/09/17   Mancel Bale, MD  hydrocortisone cream 1 % Apply 1 application topically 2 (two) times daily. Do not apply to face Patient not taking: Reported on 06/16/2017 04/12/17   Arthor Captain, PA-C  ibuprofen (ADVIL,MOTRIN) 600 MG tablet Take 1 tablet (600 mg total) by mouth every 6 (six) hours as needed. Patient not taking: Reported on 07/26/2017 07/21/17   Audry Pili, PA-C  meloxicam (MOBIC) 15 MG tablet Take 1 tablet (15 mg total) by mouth daily. 08/25/17   Gilda Crease, MD  methocarbamol (ROBAXIN) 500 MG tablet Take 1 tablet (500 mg total) by mouth 2 (two) times daily. 08/25/17   Gilda Crease, MD  naproxen (EC NAPROSYN) 500 MG EC tablet Take 1 tablet (500 mg total) by mouth 2 (two) times daily with a meal. Patient not taking: Reported on 07/26/2017 07/24/17 07/24/18  Rozann Lesches, MD  omeprazole (PRILOSEC) 40 MG capsule Take 1 capsule (40 mg total) by mouth daily. Patient not taking: Reported on 06/16/2017 03/09/17   Bing Neighbors, FNP  ondansetron (ZOFRAN ODT) 4 MG disintegrating tablet Take 1 tablet (4  mg total) by mouth every 8 (eight) hours as needed for nausea or vomiting. Patient not taking: Reported on 07/24/2017 06/29/17   Shaune Pollack, MD  pantoprazole (PROTONIX) 20 MG tablet Take 1 tablet (20 mg total) by mouth daily. Patient not taking: Reported on 07/24/2017 06/29/17 07/13/17  Shaune Pollack, MD  potassium chloride (K-DUR) 10 MEQ tablet Take 1 tablet (10 mEq total) by mouth daily. Patient not taking: Reported on 07/24/2017 03/02/17   Bing Neighbors, FNP  sucralfate (CARAFATE) 1 GM/10ML suspension Take 10 mLs (1 g total) by mouth 4  (four) times daily -  with meals and at bedtime. Patient not taking: Reported on 07/26/2017 06/29/17   Shaune Pollack, MD    Family History Family History  Problem Relation Age of Onset  . Family history unknown: Yes    Social History Social History  Substance Use Topics  . Smoking status: Never Smoker  . Smokeless tobacco: Never Used  . Alcohol use No     Allergies   Patient has no known allergies.   Review of Systems Review of Systems  Musculoskeletal: Positive for back pain.  All other systems reviewed and are negative.    Physical Exam Updated Vital Signs BP 139/79 (BP Location: Right Arm)   Pulse 76   Temp 98.5 F (36.9 C) (Oral)   Resp 18   SpO2 97%   Physical Exam  Constitutional: She is oriented to person, place, and time. She appears well-developed and well-nourished. No distress.  HENT:  Head: Normocephalic and atraumatic.  Right Ear: Hearing normal.  Left Ear: Hearing normal.  Nose: Nose normal.  Mouth/Throat: Oropharynx is clear and moist and mucous membranes are normal.  Eyes: Pupils are equal, round, and reactive to light. Conjunctivae and EOM are normal.  Neck: Normal range of motion. Neck supple.  Cardiovascular: Regular rhythm, S1 normal and S2 normal.  Exam reveals no gallop and no friction rub.   No murmur heard. Pulmonary/Chest: Effort normal and breath sounds normal. No respiratory distress. She exhibits no tenderness.  Abdominal: Soft. Normal appearance and bowel sounds are normal. There is no hepatosplenomegaly. There is no tenderness. There is no rebound, no guarding, no tenderness at McBurney's point and negative Murphy's sign. No hernia.  Musculoskeletal: Normal range of motion.       Lumbar back: She exhibits tenderness.       Back:  Neurological: She is alert and oriented to person, place, and time. She has normal strength. No cranial nerve deficit or sensory deficit. Coordination normal. GCS eye subscore is 4. GCS verbal subscore is  5. GCS motor subscore is 6.  Skin: Skin is warm, dry and intact. No rash noted. No cyanosis.  Psychiatric: She has a normal mood and affect. Her speech is normal and behavior is normal. Thought content normal.  Nursing note and vitals reviewed.    ED Treatments / Results  Labs (all labs ordered are listed, but only abnormal results are displayed) Labs Reviewed - No data to display  EKG  EKG Interpretation None       Radiology No results found.  Procedures Procedures (including critical care time)  Medications Ordered in ED Medications - No data to display   Initial Impression / Assessment and Plan / ED Course  I have reviewed the triage vital signs and the nursing notes.  Pertinent labs & imaging results that were available during my care of the patient were reviewed by me and considered in my medical decision making (see chart  for details).     Patient presents to the ER with musculoskeletal back pain. Examination reveals back tenderness without any associated neurologic findings. Patient's strength, sensation and reflexes were normal. There is no evidence of saddle anesthesia. Patient does not have a foot drop. Patient has not experienced any change in bowel or bladder function. As such, patient did not require any imaging or further studies. Patient was treated with analgesia.  Final Clinical Impressions(s) / ED Diagnoses   Final diagnoses:  Chronic bilateral low back pain without sciatica    New Prescriptions New Prescriptions   MELOXICAM (MOBIC) 15 MG TABLET    Take 1 tablet (15 mg total) by mouth daily.   METHOCARBAMOL (ROBAXIN) 500 MG TABLET    Take 1 tablet (500 mg total) by mouth 2 (two) times daily.     Gilda Crease, MD 08/25/17 [phone]

## 2017-08-31 ENCOUNTER — Ambulatory Visit: Payer: Medicaid Other | Admitting: Family Medicine

## 2017-09-03 ENCOUNTER — Emergency Department (HOSPITAL_COMMUNITY)
Admission: EM | Admit: 2017-09-03 | Discharge: 2017-09-03 | Disposition: A | Discharge status: Home / Self Care | Payer: Medicaid Other | Attending: Emergency Medicine | Admitting: Emergency Medicine

## 2017-09-03 ENCOUNTER — Encounter (HOSPITAL_COMMUNITY): Payer: Self-pay | Admitting: Emergency Medicine

## 2017-09-03 DIAGNOSIS — Z79899 Other long term (current) drug therapy: Secondary | ICD-10-CM | POA: Insufficient documentation

## 2017-09-03 DIAGNOSIS — N898 Other specified noninflammatory disorders of vagina: Secondary | ICD-10-CM | POA: Insufficient documentation

## 2017-09-03 LAB — URINALYSIS, ROUTINE W REFLEX MICROSCOPIC
BILIRUBIN URINE: NEGATIVE
GLUCOSE, UA: NEGATIVE mg/dL
HGB URINE DIPSTICK: NEGATIVE
Ketones, ur: NEGATIVE mg/dL
NITRITE: NEGATIVE
PH: 6 (ref 5.0–8.0)
Protein, ur: NEGATIVE mg/dL
SPECIFIC GRAVITY, URINE: 1.009 (ref 1.005–1.030)

## 2017-09-03 LAB — WET PREP, GENITAL
Clue Cells Wet Prep HPF POC: NONE SEEN
SPERM: NONE SEEN
Trich, Wet Prep: NONE SEEN
Yeast Wet Prep HPF POC: NONE SEEN

## 2017-09-03 LAB — I-STAT BETA HCG BLOOD, ED (MC, WL, AP ONLY): I-stat hCG, quantitative: <5 m[IU]/mL (ref <5)

## 2017-09-03 LAB — RPR: RPR: NONREACTIVE

## 2017-09-03 LAB — CBG MONITORING, ED: Glucose-Capillary: 103 mg/dL — ABNORMAL HIGH (ref 65–99)

## 2017-09-03 LAB — HIV ANTIBODY (ROUTINE TESTING W REFLEX): HIV SCREEN 4TH GENERATION: NONREACTIVE

## 2017-09-03 NOTE — Discharge Instructions (Signed)
Please follow up with your doctor for further evaluation of your condition. Return if you have any concerns. You will be notify if you tested for any specific sexually transmitted infection.

## 2017-09-03 NOTE — ED Provider Notes (Signed)
[facility] Note   CSN: [id number] Arrival date & time: 09/03/17  0502     History   Chief Complaint Chief Complaint  Patient presents with  . Vaginal Discharge    HPI Felicia Collier is a 61 y.o. female.  HPI   61 year old female with hx of Schizophrenia here with c/o vaginal discharge.  Pt believes she has sperm in vagina eventhough she is not sexually active.  Sts she notice white vaginal discharge x 1 week.  Sts "it wasn't fully removed during my last ER visit".  Pt report she feels unwell,,feels puffy and sore in RUQ and suprapubic region, having dysuria without hematuria, vaginal itch and pungent odor.  Rectal bleeding with BRB, has hx of hemorrhoid.  Denies any specific treatment tried, denies alcohol or substance use.  She believes the last time she had intercourse was 1-2 years ago. Denies allergies.      Past Medical History:  Diagnosis Date  . Chronic knee pain   . Schizophrenia [facility])     Patient Active Problem List   Diagnosis Date Noted  . Pain in left wrist 07/20/2017  . Schizophrenia (HCC) 05/01/2017  . Chronic pain of left knee 02/09/2017  . Unilateral primary osteoarthritis, left knee 02/09/2017  . Neuropathy 11/22/2016  . Gastroesophageal reflux disease without esophagitis 11/22/2016  . Weight gain 11/22/2016  . Atypical chest pain 11/04/2016    Past Surgical History:  Procedure Laterality Date  . CHOLECYSTECTOMY      OB History    No data available       Home Medications    Prior to Admission medications   Medication Sig Start Date End Date Taking? Authorizing Provider  Estradiol (ESTROGEL) 0.75 MG/1.25 GM (0.06%) topical gel Place 1.25 g onto the skin daily. Patient not taking: Reported on 07/24/2017 06/16/17   Mackuen, Cindee Salt, MD  gabapentin (NEURONTIN) 300 MG capsule Take 1 capsule (300 mg total) by mouth 3 (three) times daily. Patient not taking: Reported on 07/24/2017 05/31/17   Bing Neighbors, FNP  hydrocortisone (ANUSOL-HC) 2.5 % rectal cream Place 1 application rectally 2 (two) times daily. Patient not taking: Reported on 07/24/2017 06/24/17   Bing Neighbors, FNP  hydrocortisone (ANUSOL-HC) 25 MG suppository Place 1 suppository (25 mg total) rectally 2 (two) times daily. For 7 days Patient not taking: Reported on 06/24/2017 06/09/17   Mancel Bale, MD  hydrocortisone cream 1 % Apply 1 application topically 2 (two) times daily. Do not apply to face Patient not taking: Reported on 06/16/2017 04/12/17   Arthor Captain, PA-C  ibuprofen (ADVIL,MOTRIN) 600 MG tablet Take 1 tablet (600 mg total) by mouth every 6 (six) hours as needed. Patient not taking: Reported on 07/26/2017 07/21/17   Audry Pili, PA-C  lidocaine (LIDODERM) 5 % Place 1 patch onto the skin daily. Remove & Discard patch within 12 hours or as directed by MD 08/11/17   Elisha Ponder, PA-C  meloxicam (MOBIC) 15 MG tablet Take 1 tablet (15 mg total) by mouth daily. 08/25/17   Gilda Crease, MD  methocarbamol (ROBAXIN) 500 MG tablet Take 1 tablet (500 mg total) by mouth 2 (two) times daily. 08/25/17   Gilda Crease, MD  naproxen (EC NAPROSYN) 500 MG EC tablet Take 1 tablet (500 mg total) by mouth 2 (two) times daily with a meal. Patient not taking: Reported on 07/26/2017 07/24/17 07/24/18  Rozann Lesches, MD  omeprazole (PRILOSEC) 40 MG capsule Take 1 capsule (  40 mg total) by mouth daily. Patient not taking: Reported on 06/16/2017 03/09/17   Bing NeighborsHarris, Kimberly S, FNP  ondansetron (ZOFRAN ODT) 4 MG disintegrating tablet Take 1 tablet (4 mg total) by mouth every 8 (eight) hours as needed for nausea or vomiting. Patient not taking: Reported on 07/24/2017 06/29/17   Shaune PollackIsaacs, Cameron, MD  pantoprazole (PROTONIX) 20 MG tablet Take 1 tablet (20 mg total) by mouth daily. Patient not taking: Reported on 07/24/2017 06/29/17 07/13/17  Shaune PollackIsaacs, Cameron, MD  potassium chloride (K-DUR) 10 MEQ tablet Take 1 tablet (10 mEq total) by  mouth daily. Patient not taking: Reported on 07/24/2017 03/02/17   Bing NeighborsHarris, Kimberly S, FNP  sucralfate (CARAFATE) 1 GM/10ML suspension Take 10 mLs (1 g total) by mouth 4 (four) times daily -  with meals and at bedtime. Patient not taking: Reported on 07/26/2017 06/29/17   Shaune PollackIsaacs, Cameron, MD    Family History Family History  Problem Relation Age of Onset  . Family history unknown: Yes    Social History Social History  Substance Use Topics  . Smoking status: Never Smoker  . Smokeless tobacco: Never Used  . Alcohol use No     Allergies   Patient has no known allergies.   Review of Systems Review of Systems  All other systems reviewed and are negative.    Physical Exam Updated Vital Signs BP 124/77 (BP Location: Left Arm)   Pulse 62   Temp 98.2 F (36.8 C) (Oral)   Resp 15   Ht 5\' 5"  (1.651 m)   Wt 73.9 kg (163 lb)   SpO2 97%   BMI 27.12 kg/m   Physical Exam  Constitutional: She appears well-developed and well-nourished. No distress.  HENT:  Head: Atraumatic.  Eyes: Conjunctivae are normal.  Neck: Neck supple.  Cardiovascular: Normal rate and regular rhythm.   Pulmonary/Chest: Effort normal and breath sounds normal.  Abdominal: Soft. Bowel sounds are normal. She exhibits no distension. There is tenderness (mild suprapupic tenderness without guarding or rebound tenderness. ).  Genitourinary:  Genitourinary Comments: Chaperone present during exam. No inguinal lymphadenopathy or inguinal hernia noted. Normal external genitalia. Evidence of external hemorrhoid noted around the perianal region. No significant discomfort with speculum insertion. Normal vaginal vault. Closed cervical os visualized without any obvious dystrophic skin changes and no significant discharge noted in vaginal vault. On bimanual examination, no adnexal tenderness or cervical motion tenderness.  Neurological: She is alert.  Skin: No rash noted.  Psychiatric: She has a normal mood and affect.    Nursing note and vitals reviewed.    ED Treatments / Results  Labs (all labs ordered are listed, but only abnormal results are displayed) Labs Reviewed  CBG MONITORING, ED - Abnormal; Notable for the following:       Result Value   Glucose-Capillary 103 (*)    All other components within normal limits    EKG  EKG Interpretation None       Radiology No results found.  Procedures Procedures (including critical care time)  Medications Ordered in ED Medications - No data to display   Initial Impression / Assessment and Plan / ED Course  I have reviewed the triage vital signs and the nursing notes.  Pertinent labs & imaging results that were available during my care of the patient were reviewed by me and considered in my medical decision making (see chart for details).     BP 136/78 (BP Location: Left Arm)   Pulse (!) 54   Temp 98.2 F (  36.8 C) (Oral)   Resp (!) 22   Ht 5\' 5"  (1.651 m)   Wt 73.9 kg (163 lb)   SpO2 98%   BMI 27.12 kg/m    Final Clinical Impressions(s) / ED Diagnoses   Final diagnoses:  Vaginal discharge    New Prescriptions New Prescriptions   No medications on file   9:27 AM This is a patient with history of schizophrenia here complaining of sperm in her vaginal area ongoing for the past week. She is not currently sexually active. Her pelvic examination is unremarkable no evidence of suggest PID. No obvious discharge noted on exam and no strong odor. She has minimal suprapubic tenderness on exam.  Work up initiated.   11:08 AM Pelvic exam without concerning finding.  Wet prep does show many WBC but since pt did not report recent sexual activity, I will await culture report if pt need additional treatment.  UA without evidence of infection, preg test negative, normal CBG.  REassurance given, recommend return if condition worsen.  Pt to f/u with PCP for further care.    Fayrene Helper, PA-C 09/03/17 1109    Linwood Dibbles, MD 09/03/17  [phone]

## 2017-09-03 NOTE — ED Triage Notes (Signed)
Pt presents to the ED complaining of "white sperm" in her vagina. Patient states she is not sexually active. Upon further question patient's complaint is of thick white vaginal discharge. Patient states she has some dysuria and frequency and some itching that started this week, per patient "after I got over my back pain."

## 2017-09-03 NOTE — ED Notes (Signed)
Pelvic Cart set up in room, pt has removed clothing from the waist down.

## 2017-09-05 LAB — GC/CHLAMYDIA PROBE AMP (~~LOC~~) NOT AT ARMC
CHLAMYDIA, DNA PROBE: NEGATIVE
NEISSERIA GONORRHEA: NEGATIVE

## 2017-09-07 ENCOUNTER — Ambulatory Visit (INDEPENDENT_AMBULATORY_CARE_PROVIDER_SITE_OTHER): Payer: Medicaid Other | Admitting: Family Medicine

## 2017-09-07 ENCOUNTER — Encounter: Payer: Self-pay | Admitting: Family Medicine

## 2017-09-07 ENCOUNTER — Telehealth: Payer: Self-pay | Admitting: Family Medicine

## 2017-09-07 VITALS — BP 126/70 | HR 76 | Temp 97.9°F | Resp 14 | Ht 65.0 in | Wt 164.2 lb

## 2017-09-07 DIAGNOSIS — K5900 Constipation, unspecified: Secondary | ICD-10-CM | POA: Diagnosis not present

## 2017-09-07 DIAGNOSIS — F203 Undifferentiated schizophrenia: Secondary | ICD-10-CM | POA: Diagnosis not present

## 2017-09-07 DIAGNOSIS — R82998 Other abnormal findings in urine: Secondary | ICD-10-CM

## 2017-09-07 LAB — POCT URINALYSIS DIP (DEVICE)
BILIRUBIN URINE: NEGATIVE
GLUCOSE, UA: NEGATIVE mg/dL
Ketones, ur: NEGATIVE mg/dL
Nitrite: NEGATIVE
Protein, ur: NEGATIVE mg/dL
SPECIFIC GRAVITY, URINE: 1.015 (ref 1.005–1.030)
UROBILINOGEN UA: 0.2 mg/dL (ref 0.0–1.0)
pH: 6.5 (ref 5.0–8.0)

## 2017-09-07 NOTE — Telephone Encounter (Signed)
Engineer, manufacturing systemsContact Community for [employer] and request a referral form. I am going to refer patient for mental health services.   Godfrey PickKimberly S. Tiburcio PeaHarris, MSN, FNP-C [facility]  [address] [place], [place] [address] [phone]

## 2017-09-07 NOTE — Progress Notes (Signed)
Patient ID: Felicia Collier, female    DOB: 05-10-1956, 61 y.o.   MRN: [id number]  PCP: Bing Neighbors, FNP  Chief Complaint  Patient presents with  . Follow-up    Back pain    Subjective:  HPI Felicia Collier is a 61 y.o. female presents for evaluation of most emergency department visits. Felicia Collier has been seen and evaluated in the ED 27 times within the last 6 months. She had been referred to orthopedics for evaluation and treatment of back pain which is secondary to osteoporosis and has a complete work-up and treatment for knee pain, back pain, and wrist pain. She was deemed a non-viable candidate for surgical  intervention to correct these chronic issues. Felicia Collier hasn't been to [facility] for behavioral health services. She states " Mr. Merlyn Albert, my doctor at [facility] put her on TV and told her that she doesn't need injections". She resides currently at home with her son, however he doesn't accompany her to medical visits. Felicia Collier also complains today of recent hard stools over the last 3 days. Reports daily bowel movements. Admits that she is not drinking water consistently nor eating fiber rich foods such as fruits. Felicia Collier reports that she lost food stamps for one month, although went to a hearing and food stamps are approved now. She denies any complaints of abdominal pain, shortness of breath, or chest pain.  Social History   Social History  . Marital status: Single    Spouse name: N/A  . Number of children: N/A  . Years of education: N/A   Occupational History  . Not on file.   Social History Main Topics  . Smoking status: Never Smoker  . Smokeless tobacco: Never Used  . Alcohol use No  . Drug use: No  . Sexual activity: Not Currently   Other Topics Concern  . Not on file   Social History Narrative  . No narrative on file    Family History  Problem Relation Age of Onset  . Family history unknown: Yes   Review of Systems HPII  Patient Active Problem List   Diagnosis Date  Noted  . Pain in left wrist 07/20/2017  . Schizophrenia (HCC) 05/01/2017  . Chronic pain of left knee 02/09/2017  . Unilateral primary osteoarthritis, left knee 02/09/2017  . Neuropathy 11/22/2016  . Gastroesophageal reflux disease without esophagitis 11/22/2016  . Weight gain 11/22/2016  . Atypical chest pain 11/04/2016    No Known Allergies  Prior to Admission medications   Medication Sig Start Date End Date Taking? Authorizing Provider  Estradiol (ESTROGEL) 0.75 MG/1.25 GM (0.06%) topical gel Place 1.25 g onto the skin daily. Patient not taking: Reported on 07/24/2017 06/16/17   Mackuen, Cindee Salt, MD  gabapentin (NEURONTIN) 300 MG capsule Take 1 capsule (300 mg total) by mouth 3 (three) times daily. Patient not taking: Reported on 07/24/2017 05/31/17   Bing Neighbors, FNP  hydrocortisone (ANUSOL-HC) 2.5 % rectal cream Place 1 application rectally 2 (two) times daily. Patient not taking: Reported on 07/24/2017 06/24/17   Bing Neighbors, FNP  hydrocortisone (ANUSOL-HC) 25 MG suppository Place 1 suppository (25 mg total) rectally 2 (two) times daily. For 7 days Patient not taking: Reported on 06/24/2017 06/09/17   Mancel Bale, MD  hydrocortisone cream 1 % Apply 1 application topically 2 (two) times daily. Do not apply to face Patient not taking: Reported on 06/16/2017 04/12/17   Arthor Captain, PA-C  ibuprofen (ADVIL,MOTRIN) 600 MG tablet Take 1 tablet (600 mg total) by  mouth every 6 (six) hours as needed. Patient not taking: Reported on 07/26/2017 07/21/17   Audry PiliMohr, Tyler, PA-C  lidocaine (LIDODERM) 5 % Place 1 patch onto the skin daily. Remove & Discard patch within 12 hours or as directed by MD Patient not taking: Reported on 09/07/2017 08/11/17   Elisha PonderMurray, Alyssa B, PA-C  meloxicam (MOBIC) 15 MG tablet Take 1 tablet (15 mg total) by mouth daily. Patient not taking: Reported on 09/03/2017 08/25/17   Gilda CreasePollina, Christopher J, MD  methocarbamol (ROBAXIN) 500 MG tablet Take 1 tablet (500 mg  total) by mouth 2 (two) times daily. Patient not taking: Reported on 09/07/2017 08/25/17   Gilda CreasePollina, Christopher J, MD  naproxen (EC NAPROSYN) 500 MG EC tablet Take 1 tablet (500 mg total) by mouth 2 (two) times daily with a meal. Patient not taking: Reported on 07/26/2017 07/24/17 07/24/18  Rozann LeschesNedrud, Marybeth, MD  omeprazole (PRILOSEC) 40 MG capsule Take 1 capsule (40 mg total) by mouth daily. Patient not taking: Reported on 06/16/2017 03/09/17   Bing NeighborsHarris, Fergus Throne S, FNP  ondansetron (ZOFRAN ODT) 4 MG disintegrating tablet Take 1 tablet (4 mg total) by mouth every 8 (eight) hours as needed for nausea or vomiting. Patient not taking: Reported on 07/24/2017 06/29/17   Shaune PollackIsaacs, Cameron, MD  pantoprazole (PROTONIX) 20 MG tablet Take 1 tablet (20 mg total) by mouth daily. Patient not taking: Reported on 07/24/2017 06/29/17 07/13/17  Shaune PollackIsaacs, Cameron, MD  potassium chloride (K-DUR) 10 MEQ tablet Take 1 tablet (10 mEq total) by mouth daily. Patient not taking: Reported on 07/24/2017 03/02/17   Bing NeighborsHarris, Tirza Senteno S, FNP  sucralfate (CARAFATE) 1 GM/10ML suspension Take 10 mLs (1 g total) by mouth 4 (four) times daily -  with meals and at bedtime. Patient not taking: Reported on 07/26/2017 06/29/17   Shaune PollackIsaacs, Cameron, MD    Past Medical, Surgical Family and Social History reviewed and updated.    Objective:   Today's Vitals   09/07/17 0811  BP: 126/70  Pulse: 76  Resp: 14  Temp: 97.9 F (36.6 C)  TempSrc: Oral  SpO2: 98%  Weight: 164 lb 3.2 oz (74.5 kg)  Height: 5\' 5"  (1.651 m)    Wt Readings from Last 3 Encounters:  09/07/17 164 lb 3.2 oz (74.5 kg)  09/03/17 163 lb (73.9 kg)  08/11/17 163 lb (73.9 kg)   Physical Exam  Constitutional: She is oriented to person, place, and time. She appears well-developed and well-nourished.  HENT:  Head: Normocephalic and atraumatic.  Eyes: Pupils are equal, round, and reactive to light. Conjunctivae and EOM are normal.  Neck: Normal range of motion. Neck supple. No  thyromegaly present.  Cardiovascular: Normal rate, regular rhythm, normal heart sounds and intact distal pulses.   Pulmonary/Chest: Effort normal and breath sounds normal.  Musculoskeletal: Normal range of motion.  Lymphadenopathy:    She has no cervical adenopathy.  Neurological: She is alert and oriented to person, place, and time.  Skin: Skin is warm and dry.  Psychiatric: Her speech is normal. Thought content is delusional. Cognition and memory are impaired. She expresses inappropriate judgment.   Assessment & Plan:  1. Constipation, unspecified constipation type, encouraged increase water intake to at least 6-8 , 8 ounces glasses of water daily. Recommended raisins, apples, applesauce, green leafy vegetable to increase fiber. 2. Undifferentiated schizophrenia (HCC)- [facility] an attempt to obtain services for patient in hope of reducing ED frequency. 3. Leukocytes in urine- Urine Culture  Return for care in 3 weeks for follow-up of constipation.   Godfrey Pick. Tiburcio Pea, MSN, FNP-C [facility]  [address] [place], [place] [address] [phone]

## 2017-09-07 NOTE — Telephone Encounter (Signed)
Fax referral form for [facility].   Godfrey PickKimberly S. Tiburcio PeaHarris, MSN, FNP-C [facility]  [address] [place], [place] [address] [phone]

## 2017-09-08 NOTE — Telephone Encounter (Signed)
Forms faxed per provider request. 

## 2017-09-09 LAB — URINE CULTURE
MICRO NUMBER: 81190756
SPECIMEN QUALITY:: ADEQUATE

## 2017-09-09 MED ORDER — CEPHALEXIN 500 MG PO CAPS: 500.0000 mg | ORAL_CAPSULE | Freq: Two times a day (BID) | ORAL | 0 refills | Status: DC

## 2017-09-09 NOTE — Addendum Note (Signed)
Addended by: Bing NeighborsHARRIS, Sayf Kerner S on: 09/09/2017 09:22 AM   Modules accepted: Orders

## 2017-09-15 ENCOUNTER — Emergency Department (HOSPITAL_COMMUNITY)
Admission: EM | Admit: 2017-09-15 | Discharge: 2017-09-15 | Discharge status: Against Medical Advice | Payer: Medicaid Other

## 2017-09-15 ENCOUNTER — Encounter (HOSPITAL_COMMUNITY): Payer: Self-pay

## 2017-09-15 ENCOUNTER — Emergency Department (HOSPITAL_COMMUNITY)
Admission: EM | Admit: 2017-09-15 | Discharge: 2017-09-16 | Disposition: A | Discharge status: Home / Self Care | Payer: Medicaid Other | Attending: Emergency Medicine | Admitting: Emergency Medicine

## 2017-09-15 DIAGNOSIS — M545 Low back pain: Secondary | ICD-10-CM | POA: Insufficient documentation

## 2017-09-15 DIAGNOSIS — R300 Dysuria: Secondary | ICD-10-CM | POA: Insufficient documentation

## 2017-09-15 DIAGNOSIS — G8929 Other chronic pain: Secondary | ICD-10-CM | POA: Insufficient documentation

## 2017-09-15 DIAGNOSIS — F209 Schizophrenia, unspecified: Secondary | ICD-10-CM | POA: Insufficient documentation

## 2017-09-15 NOTE — ED Triage Notes (Signed)
Pt c/o chronic lower back pain and burning around her vagina. She states that the burning is not just when she urinates. A&Ox4. Ambulatory.

## 2017-09-15 NOTE — ED Provider Notes (Signed)
[facility] Provider Note   CSN: [id number] Arrival date & time: 09/15/17  2113     History   Chief Complaint Chief Complaint  Patient presents with  . Back Pain  . Pelvic Pain    HPI Charnetta Wulff is a 61 y.o. female.  The history is provided by the patient. The history is limited by the condition of the patient (Schizophrenia).  Back Pain   Associated symptoms include pelvic pain.  Pelvic Pain   She has a history of chronic back pain and states that she is continuing to have low back pain without radiation.  She is unable to put a number on the pain.  Nothing makes it better, nothing makes it worse.  There is no radiation of pain.  She denies weakness or numbness.  She is also complaining of dysuria with some urinary urgency and frequency.  She states that she took some back pain medicine, but that it did not help.  She denies fever or chills.  She denies nausea or vomiting.  She also states that she was stung in the back of her head by a ghost snake that is staying in the walls.  Past Medical History:  Diagnosis Date  . Chronic knee pain   . Schizophrenia [facility])     Patient Active Problem List   Diagnosis Date Noted  . Pain in left wrist 07/20/2017  . Schizophrenia (HCC) 05/01/2017  . Chronic pain of left knee 02/09/2017  . Unilateral primary osteoarthritis, left knee 02/09/2017  . Neuropathy 11/22/2016  . Gastroesophageal reflux disease without esophagitis 11/22/2016  . Weight gain 11/22/2016  . Atypical chest pain 11/04/2016    Past Surgical History:  Procedure Laterality Date  . CHOLECYSTECTOMY      OB History    No data available       Home Medications    Prior to Admission medications   Medication Sig Start Date End Date Taking? Authorizing Provider  cephALEXin (KEFLEX) 500 MG capsule Take 1 capsule (500 mg total) by mouth 2 (two) times daily. 09/09/17   Bing Neighbors, FNP  gabapentin (NEURONTIN) 300 MG capsule  Take 1 capsule (300 mg total) by mouth 3 (three) times daily. Patient not taking: Reported on 07/24/2017 05/31/17   Bing Neighbors, FNP    Family History Family History  Problem Relation Age of Onset  . Family history unknown: Yes    Social History Social History  Substance Use Topics  . Smoking status: Never Smoker  . Smokeless tobacco: Never Used  . Alcohol use No     Allergies   Patient has no known allergies.   Review of Systems Review of Systems  Unable to perform ROS: Psychiatric disorder  Genitourinary: Positive for pelvic pain.  Musculoskeletal: Positive for back pain.     Physical Exam Updated Vital Signs BP 136/84 (BP Location: Left Arm)   Pulse 65   Temp 97.7 F (36.5 C) (Oral)   Resp 16   Ht 5\' 5"  (1.651 m)   Wt 76.2 kg (168 lb)   SpO2 96%   BMI 27.96 kg/m   Physical Exam  Nursing note and vitals reviewed.  61 year old female, resting comfortably and in no acute distress. Vital signs are normal. Oxygen saturation is 96%, which is normal. Head is normocephalic and atraumatic. PERRLA, EOMI. Oropharynx is clear. Neck is nontender and supple without adenopathy or JVD. Back is nontender and there is no CVA tenderness.  Straight leg raise  is negative. Lungs are clear without rales, wheezes, or rhonchi. Chest is nontender. Heart has regular rate and rhythm without murmur. Abdomen is soft, flat, nontender without masses or hepatosplenomegaly and peristalsis is normoactive. Extremities have no cyanosis or edema, full range of motion is present. Skin is warm and dry without rash. Neurologic: Mental status is normal, cranial nerves are intact, there are no motor or sensory deficits.  ED Treatments / Results  Labs (all labs ordered are listed, but only abnormal results are displayed) Labs Reviewed  URINALYSIS, ROUTINE W REFLEX MICROSCOPIC - Abnormal; Notable for the following:       Result Value   Color, Urine STRAW (*)    Leukocytes, UA MODERATE (*)     Squamous Epithelial / LPF 0-5 (*)    All other components within normal limits  URINE CULTURE   Procedures Procedures (including critical care time)  Medications Ordered in ED Medications - No data to display   Initial Impression / Assessment and Plan / ED Course  I have reviewed the triage vital signs and the nursing notes.  Pertinent lab results that were available during my care of the patient were reviewed by me and considered in my medical decision making (see chart for details).  Chronic low back pain.  No red flags to suggest more serious condition.  Possible urinary tract infection.  Old records are reviewed, and she has 30 ED visits in the last 6months.  These visits include visits for low back pain and urinary tract infection/cystitis.  Urine culture is have consistently shown either multiple organisms suggesting a contaminant, or had no growth.  Urinalysis does show 6-30 WBCs per high-power field, but no bacteria seen.  Specimen is sent for culture, but I suspect this will be negative.  However, given symptoms, she will be treated with a 5-day course of nitrofurantoin.  Given prescription for meloxicam for her back pain.  Referred back to her PCP.  Final Clinical Impressions(s) / ED Diagnoses   Final diagnoses:  Dysuria  Chronic midline low back pain without sciatica  Schizophrenia, unspecified type (HCC)    New Prescriptions New Prescriptions   MELOXICAM (MOBIC) 7.5 MG TABLET    Take 1 tablet (7.5 mg total) by mouth daily.   NITROFURANTOIN, MACROCRYSTAL-MONOHYDRATE, (MACROBID) 100 MG CAPSULE    Take 1 capsule (100 mg total) by mouth 2 (two) times daily.     Dione BoozeGlick, Hendryx Ricke, MD 09/16/17 [phone]

## 2017-09-16 LAB — URINALYSIS, ROUTINE W REFLEX MICROSCOPIC
BACTERIA UA: NONE SEEN
Bilirubin Urine: NEGATIVE
GLUCOSE, UA: NEGATIVE mg/dL
Hgb urine dipstick: NEGATIVE
Ketones, ur: NEGATIVE mg/dL
Nitrite: NEGATIVE
PROTEIN: NEGATIVE mg/dL
Specific Gravity, Urine: 1.009 (ref 1.005–1.030)
pH: 7 (ref 5.0–8.0)

## 2017-09-16 MED ORDER — NITROFURANTOIN MONOHYD MACRO 100 MG PO CAPS: 100.0000 mg | ORAL_CAPSULE | Freq: Two times a day (BID) | ORAL | 0 refills | Status: DC

## 2017-09-16 MED ORDER — MELOXICAM 7.5 MG PO TABS: 7.5000 mg | ORAL_TABLET | Freq: Every day | ORAL | 0 refills | Status: DC

## 2017-09-17 LAB — URINE CULTURE: Culture: 10000 — AB

## 2017-09-18 ENCOUNTER — Telehealth: Payer: Self-pay | Admitting: Emergency Medicine

## 2017-09-18 NOTE — Telephone Encounter (Signed)
Post ED Visit - Positive Culture Follow-up  Culture report reviewed by antimicrobial stewardship pharmacist:  []  Enzo BiNathan Batchelder, Pharm.D. []  Celedonio MiyamotoJeremy Frens, Pharm.D., BCPS AQ-ID [x]  Garvin FilaMike Maccia, Pharm.D., BCPS []  Georgina PillionElizabeth Martin, Pharm.D., BCPS []  TrimbleMinh Pham, Vermont[place]arm.D., BCPS, AAHIVP []  Estella HuskMichelle Turner, Pharm.D., BCPS, AAHIVP []  Lysle Pearlachel Rumbarger, PharmD, BCPS []  Casilda Carlsaylor Stone, PharmD, BCPS []  Pollyann SamplesAndy Johnston, PharmD, BCPS  Positive urine culture Treated with nitrofurantoin, organism sensitive to the same and no further patient follow-up is required at this time.  Berle MullMiller, Madelene Kaatz 09/18/2017, 3:36 PM

## 2017-09-26 ENCOUNTER — Encounter: Payer: Self-pay | Admitting: Family Medicine

## 2017-09-26 ENCOUNTER — Ambulatory Visit: Payer: Medicaid Other | Admitting: Family Medicine

## 2017-09-26 VITALS — BP 130/70 | HR 74 | Temp 98.0°F | Ht 65.0 in | Wt 163.2 lb

## 2017-09-26 DIAGNOSIS — R300 Dysuria: Secondary | ICD-10-CM | POA: Diagnosis not present

## 2017-09-26 DIAGNOSIS — Z23 Encounter for immunization: Secondary | ICD-10-CM | POA: Diagnosis not present

## 2017-09-26 DIAGNOSIS — Z9189 Other specified personal risk factors, not elsewhere classified: Secondary | ICD-10-CM

## 2017-09-26 LAB — POCT URINALYSIS DIP (DEVICE)
Bilirubin Urine: NEGATIVE
GLUCOSE, UA: NEGATIVE mg/dL
Hgb urine dipstick: NEGATIVE
KETONES UR: NEGATIVE mg/dL
Nitrite: NEGATIVE
Protein, ur: NEGATIVE mg/dL
Urobilinogen, UA: 1 mg/dL (ref 0.0–1.0)
pH: 5.5 (ref 5.0–8.0)

## 2017-09-26 LAB — CBC WITH DIFFERENTIAL/PLATELET
BASOS PCT: 0.3 %
Basophils Absolute: 28 cells/uL (ref 0–200)
EOS ABS: 169 {cells}/uL (ref 15–500)
Eosinophils Relative: 1.8 %
HCT: 40.6 % (ref 35.0–45.0)
HEMOGLOBIN: 12.6 g/dL (ref 11.7–15.5)
Lymphs Abs: 1777 cells/uL (ref 850–3900)
MCH: 25.5 pg — AB (ref 27.0–33.0)
MCHC: 31 g/dL — ABNORMAL LOW (ref 32.0–36.0)
MCV: 82 fL (ref 80.0–100.0)
MONOS PCT: 7.1 %
MPV: 11.8 fL (ref 7.5–12.5)
NEUTROS ABS: 6759 {cells}/uL (ref 1500–7800)
Neutrophils Relative %: 71.9 %
PLATELETS: 304 10*3/uL (ref 140–400)
RBC: 4.95 10*6/uL (ref 3.80–5.10)
RDW: 13.3 % (ref 11.0–15.0)
Total Lymphocyte: 18.9 %
WBC: 9.4 10*3/uL (ref 3.8–10.8)
WBCMIX: 667 {cells}/uL (ref 200–950)

## 2017-09-26 LAB — BASIC METABOLIC PANEL
BUN: 11 mg/dL (ref 7–25)
CO2: 27 mmol/L (ref 20–32)
CREATININE: 0.77 mg/dL (ref 0.50–0.99)
Calcium: 9 mg/dL (ref 8.6–10.4)
Chloride: 104 mmol/L (ref 98–110)
GLUCOSE: 85 mg/dL (ref 65–99)
Potassium: 3.7 mmol/L (ref 3.5–5.3)
SODIUM: 140 mmol/L (ref 135–146)

## 2017-09-26 NOTE — Progress Notes (Signed)
Patient ID: Felicia Collier, female    DOB: Feb 12, 1956, 61 y.o.   MRN: [id number]  PCP: Bing NeighborsHarris, Damontay Alred S, FNP  Chief Complaint  Patient presents with  . Follow-up    Subjective:  HPI Felicia Collier is a 61 y.o. female presents for emergency department follow-up.  [name] has had 29 ED visits in less than 6 months.  She was last [facility] at [facility] emergency with a complaint of dysuria on 09/15/2017 and was found to have a UTI. Urine culture was positive for Group B strep. She was prescribed nitrofurantoin and reports today that she discontinued taking medication as she felt medication was causing her constipation. She has about 3 days worth of medication remaining. She has a history of intermittent constipation and reports increased fiber rich foods and attempting to increase water intake. Last BM was today. [name] continues to complain of dysuria symptoms, although denies fever, chills, flank pain or abdominal pain. During Jyoti's las visit here in clinic 09/07/2017, she was [facility] to [facility] for assistance with mental health resources although she reports that she has not heard from anyone as of yet. Social History   Socioeconomic History  . Marital status: Single    Spouse name: Not on file  . Number of children: Not on file  . Years of education: Not on file  . Highest education level: Not on file  Social Needs  . Financial resource strain: Not on file  . Food insecurity - worry: Not on file  . Food insecurity - inability: Not on file  . Transportation needs - medical: Not on file  . Transportation needs - non-medical: Not on file  Occupational History  . Not on file  Tobacco Use  . Smoking status: Never Smoker  . Smokeless tobacco: Never Used  Substance and Sexual Activity  . Alcohol use: No  . Drug use: No  . Sexual activity: Not Currently  Other Topics Concern  . Not on file  Social History Narrative  . Not on file    Family History  Family history  unknown: Yes   Review of Systems See HPI  Patient Active Problem List   Diagnosis Date Noted  . Pain in left wrist 07/20/2017  . Schizophrenia (HCC) 05/01/2017  . Chronic pain of left knee 02/09/2017  . Unilateral primary osteoarthritis, left knee 02/09/2017  . Neuropathy 11/22/2016  . Gastroesophageal reflux disease without esophagitis 11/22/2016  . Weight gain 11/22/2016  . Atypical chest pain 11/04/2016    Allergies  Allergen Reactions  . Lidocaine     Face swelling     Prior to Admission medications   Medication Sig Start Date End Date Taking? Authorizing Provider  meloxicam (MOBIC) 7.5 MG tablet Take 1 tablet (7.5 mg total) by mouth daily. 09/16/17  Yes Dione BoozeGlick, David, MD  nitrofurantoin, macrocrystal-monohydrate, (MACROBID) 100 MG capsule Take 1 capsule (100 mg total) by mouth 2 (two) times daily. Patient not taking: Reported on 09/26/2017 09/16/17   Dione BoozeGlick, David, MD    Past Medical, Surgical Family and Social History reviewed and updated.    Objective:   Today's Vitals   09/26/17 0802  BP: 130/70  Pulse: 74  Temp: 98 F (36.7 C)  TempSrc: Oral  SpO2: 98%  Weight: 163 lb 3.2 oz (74 kg)  Height: 5\' 5"  (1.651 m)    Wt Readings from Last 3 Encounters:  09/26/17 163 lb 3.2 oz (74 kg)  09/15/17 168 lb (76.2 kg)  09/07/17 164 lb 3.2 oz (  74.5 kg)    Physical Exam  Constitutional: She is oriented to person, place, and time. She appears well-developed and well-nourished. She does not have a sickly appearance. She does not appear ill.  HENT:  Head: Normocephalic and atraumatic.  Eyes: Conjunctivae and EOM are normal. Pupils are equal, round, and reactive to light.  Neck: Normal range of motion.  Cardiovascular: Normal rate, regular rhythm, normal heart sounds and intact distal pulses.  Pulmonary/Chest: Effort normal and breath sounds normal.  Abdominal: Bowel sounds are normal.  Musculoskeletal: Normal range of motion.  Neurological: She is alert and oriented to  person, place, and time.  Skin: Skin is dry.  Psychiatric: She has a normal mood and affect. Her behavior is normal. Judgment and thought content normal.   Assessment & Plan:  1. Dysuria, educated on the importance of completing antibiotic therapy in it's entirety to prevent recurrent urinary tract infections. Will check a CBC today as patient continues to complain of dysuria to rule out pyelonephritis.  2. Electrolyte imbalance risk, checking BMP today.   3. Needs flu shot- Flu Vaccine QUAD 6+ mos PF IM (Fluarix Quad PF)  Orders Placed This Encounter  Procedures  . Flu Vaccine QUAD 6+ mos PF IM (Fluarix Quad PF)  . CBC with Differential  . Basic metabolic panel  . POCT urinalysis dip (device)    Return for care in 2 weeks for repeat UA.   Godfrey PickKimberly S. Tiburcio PeaHarris, MSN, FNP-C [facility]  [address] [place], [place] [address] [phone]

## 2017-10-10 ENCOUNTER — Ambulatory Visit: Payer: Medicaid Other | Admitting: Family Medicine

## 2017-10-10 ENCOUNTER — Encounter: Payer: Self-pay | Admitting: Family Medicine

## 2017-10-10 VITALS — BP 112/68 | HR 77 | Temp 98.9°F | Resp 16 | Ht 65.0 in | Wt 164.0 lb

## 2017-10-10 DIAGNOSIS — Z09 Encounter for follow-up examination after completed treatment for conditions other than malignant neoplasm: Secondary | ICD-10-CM

## 2017-10-10 DIAGNOSIS — F203 Undifferentiated schizophrenia: Secondary | ICD-10-CM | POA: Diagnosis not present

## 2017-10-10 LAB — POCT URINALYSIS DIP (DEVICE)
BILIRUBIN URINE: NEGATIVE
Glucose, UA: NEGATIVE mg/dL
HGB URINE DIPSTICK: NEGATIVE
KETONES UR: NEGATIVE mg/dL
Nitrite: NEGATIVE
PH: 6 (ref 5.0–8.0)
Protein, ur: NEGATIVE mg/dL
SPECIFIC GRAVITY, URINE: 1.01 (ref 1.005–1.030)
Urobilinogen, UA: 0.2 mg/dL (ref 0.0–1.0)

## 2017-10-10 NOTE — Patient Instructions (Signed)

## 2017-10-10 NOTE — Progress Notes (Signed)
Patient ID: Felicia Collier, female    DOB: [birth year], 61 y.o.   MRN: [id number]  PCP: Bing NeighborsHarris, Earlie Arciga S, FNP  Chief Complaint  Patient presents with  . Follow-up    2 WEEKS    Subjective:  HPI Felicia Collier is a 61 y.o. female with medical history significant for untreated schizophrenia, presents for a two week follow-up evaluation dysuria, abdominal pain, and chronic back pain. Reports improvement of symptoms. Felicia Collier reports having two antibiotics remaining. Felicia Collier has an appointment with [facility] worker from [facility] today. [facility] to [facility] for mental health and coordination of care services as she has had 27 emergency department within the past 6 months. Felicia Collier suffers from severe schizophrenia which is untreated at present. Felicia Collier has intermittent auditory hallucinations, flight of ideas, and paranoia. In the past, she has sought treatment [facility] , however, she has not been to their office for follow-up in sometime. Felicia Collier denies any thoughts of suicide or harming others. Social History   Socioeconomic History  . Marital status: Single    Spouse name: Not on file  . Number of children: Not on file  . Years of education: Not on file  . Highest education level: Not on file  Social Needs  . Financial resource strain: Not on file  . Food insecurity - worry: Not on file  . Food insecurity - inability: Not on file  . Transportation needs - medical: Not on file  . Transportation needs - non-medical: Not on file  Occupational History  . Not on file  Tobacco Use  . Smoking status: Never Smoker  . Smokeless tobacco: Never Used  Substance and Sexual Activity  . Alcohol use: No  . Drug use: No  . Sexual activity: Not Currently  Other Topics Concern  . Not on file  Social History Narrative  . Not on file    Family History  Family history unknown: Yes   Review of Systems  See HPI   Patient Active Problem List   Diagnosis  Date Noted  . Pain in left wrist 07/20/2017  . Schizophrenia (HCC) 05/01/2017  . Chronic pain of left knee 02/09/2017  . Unilateral primary osteoarthritis, left knee 02/09/2017  . Neuropathy 11/22/2016  . Gastroesophageal reflux disease without esophagitis 11/22/2016  . Weight gain 11/22/2016  . Atypical chest pain 11/04/2016    Allergies  Allergen Reactions  . Lidocaine     Face swelling     Prior to Admission medications   Medication Sig Start Date End Date Taking? Authorizing Provider  meloxicam (MOBIC) 7.5 MG tablet Take 1 tablet (7.5 mg total) by mouth daily. 09/16/17  Yes Dione BoozeGlick, David, MD  nitrofurantoin, macrocrystal-monohydrate, (MACROBID) 100 MG capsule Take 1 capsule (100 mg total) by mouth 2 (two) times daily. Patient not taking: Reported on 09/26/2017 09/16/17   Dione BoozeGlick, David, MD    Past Medical, Surgical Family and Social History reviewed and updated.    Objective:   Today's Vitals   10/10/17 0800  BP: 112/68  Pulse: 77  Resp: 16  Temp: 98.9 F (37.2 C)  TempSrc: Oral  SpO2: 97%  Weight: 164 lb (74.4 kg)  Height: 5\' 5"  (1.651 m)    Wt Readings from Last 3 Encounters:  10/10/17 164 lb (74.4 kg)  09/26/17 163 lb 3.2 oz (74 kg)  09/15/17 168 lb (76.2 kg)   Physical Exam Physical Exam: Constitutional: Patient appears well-developed and well-nourished. No distress. HENT: Normocephalic, atraumatic, External right and left  ear normal. Oropharynx is clear and moist.  Eyes: Conjunctivae and EOM are normal. PERRLA, no scleral icterus. Neck: Normal ROM. Neck supple. No JVD. No tracheal deviation. No thyromegaly. CVS: RRR, S1/S2 +, no murmurs, no gallops, no carotid bruit.  Pulmonary: Effort and breath sounds normal, no stridor, rhonchi, wheezes, rales.  Abdominal: Soft. BS +, no distension, tenderness, rebound or guarding.  Musculoskeletal: Normal range of motion. No edema and no tenderness.  Lymphadenopathy: No lymphadenopathy noted, cervical, inguinal or  axillary Neuro: Alert. Normal muscle tone coordination. No cranial nerve deficit. Skin: Skin is warm and dry. No rash noted. Not diaphoretic. No erythema. No pallor. Psychiatric: Normal mood and affect. Behavior normal. Positive for flight of ideas and inappropriate judgement.   Assessment & Plan:  1. Undifferentiated schizophrenia (HCC), Felicia Collier was [facility] to [facility] for [facility] health and coordination of care management. Patient reports that she has a home visit scheduled today and expressed that she may be interested in resuming medication management for schizophrenia.  2. Follow-up examination, dysuria, abdominal pain, and back pain-all resolved. Bruce DonathSheila stilled advised that she had attempted to go the ED yesterday, however had automobile trouble. Felicia Collier was counseled extensively regarding inappropriate use of emergency services and "emergency situation" that would warrant her to follow-up at the emergency department. She verbalized understanding.    RTC: 1 month for mental health follow-up.   Godfrey PickKimberly S. Tiburcio PeaHarris, MSN, FNP-C [facility]  [address] [place], [place] [address] [phone]

## 2017-10-16 ENCOUNTER — Encounter (HOSPITAL_COMMUNITY): Payer: Self-pay | Admitting: Emergency Medicine

## 2017-10-16 ENCOUNTER — Emergency Department (HOSPITAL_COMMUNITY)
Admission: EM | Admit: 2017-10-16 | Discharge: 2017-10-16 | Disposition: A | Discharge status: Home / Self Care | Payer: Medicaid Other | Attending: Emergency Medicine | Admitting: Emergency Medicine

## 2017-10-16 ENCOUNTER — Other Ambulatory Visit: Payer: Self-pay

## 2017-10-16 DIAGNOSIS — Z79899 Other long term (current) drug therapy: Secondary | ICD-10-CM | POA: Insufficient documentation

## 2017-10-16 DIAGNOSIS — M79644 Pain in right finger(s): Secondary | ICD-10-CM | POA: Diagnosis not present

## 2017-10-16 MED ORDER — MELOXICAM 7.5 MG PO TABS: 7.5000 mg | ORAL_TABLET | Freq: Every day | ORAL | 0 refills | Status: DC

## 2017-10-16 NOTE — Discharge Instructions (Signed)
Please wrist velcro splint as needed for support.  Take meloxicam as prescribed for pain. Follow up with your doctor for further care.

## 2017-10-16 NOTE — ED Provider Notes (Signed)
[facility]-EMERGENCY DEPT Provider Note   CSN: [id number] Arrival date & time: 10/16/17  [id number]     History   Chief Complaint Chief Complaint  Patient presents with  . thumb pain    HPI Rowland LatheSheila Trautmann is a 61 y.o. female.  HPI   61 year old female with history of schizophrenia, chronic knee pain presenting complaining of right thumb pain.  Patient report for the past 3 months she has had recurrent pain to her right thumb.  Pain is described as "it hurts and it draws up" worsening with movement and with grabbing objects.  Her pain radiates towards her wrist and forearm.  Pain is of moderate in intensity.  She has tried to rest and "stretching it out" without relief.  She denies any associated fever chest pain trouble breathing or rash.  She is left-hand dominant.  She denies any specific injury.  Past Medical History:  Diagnosis Date  . Chronic knee pain   . Schizophrenia [facility](HCC)     Patient Active Problem List   Diagnosis Date Noted  . Pain in left wrist 07/20/2017  . Schizophrenia (HCC) 05/01/2017  . Chronic pain of left knee 02/09/2017  . Unilateral primary osteoarthritis, left knee 02/09/2017  . Neuropathy 11/22/2016  . Gastroesophageal reflux disease without esophagitis 11/22/2016  . Weight gain 11/22/2016  . Atypical chest pain 11/04/2016    Past Surgical History:  Procedure Laterality Date  . CHOLECYSTECTOMY      OB History    No data available       Home Medications    Prior to Admission medications   Medication Sig Start Date End Date Taking? Authorizing Provider  meloxicam (MOBIC) 7.5 MG tablet Take 1 tablet (7.5 mg total) by mouth daily. 09/16/17   Dione BoozeGlick, David, MD  nitrofurantoin, macrocrystal-monohydrate, (MACROBID) 100 MG capsule Take 1 capsule (100 mg total) by mouth 2 (two) times daily. Patient not taking: Reported on 09/26/2017 09/16/17   Dione BoozeGlick, David, MD    Family History Family History  Family history unknown: Yes     Social History Social History   Tobacco Use  . Smoking status: Never Smoker  . Smokeless tobacco: Never Used  Substance Use Topics  . Alcohol use: No  . Drug use: No     Allergies   Lidocaine   Review of Systems Review of Systems  Constitutional: Negative for fever.  Skin: Negative for rash and wound.  Neurological: Negative for numbness.     Physical Exam Updated Vital Signs BP (!) 147/81 (BP Location: Left Arm)   Pulse (!) 56   Temp 98.3 F (36.8 C) (Oral)   Resp 18   Ht 5\' 5"  (1.651 m)   Wt 73.5 kg (162 lb)   SpO2 98%   BMI 26.96 kg/m   Physical Exam  Constitutional: She appears well-developed and well-nourished. No distress.  HENT:  Head: Atraumatic.  Eyes: Conjunctivae are normal.  Neck: Neck supple.  Cardiovascular: Intact distal pulses.  Musculoskeletal: She exhibits tenderness (Right hand: Mild tenderness along the first MCP with normal thumb range of motion and no swelling redness or deformity noted.  Right wrist with full range of motion, right forearm soft and nontender.).  Right hand with normal grip strength, and full range of motion throughout each fingers.  Brisk cap refill throughout.  Neurological: She is alert.  Skin: No rash noted.  Psychiatric: She has a normal mood and affect.  Nursing note and vitals reviewed.    ED Treatments / Results  Labs (all labs ordered are listed, but only abnormal results are displayed) Labs Reviewed - No data to display  EKG  EKG Interpretation None       Radiology No results found.  Procedures Procedures (including critical care time)  Medications Ordered in ED Medications - No data to display   Initial Impression / Assessment and Plan / ED Course  I have reviewed the triage vital signs and the nursing notes.  Pertinent labs & imaging results that were available during my care of the patient were reviewed by me and considered in my medical decision making (see chart for details).      BP (!) 147/81 (BP Location: Left Arm)   Pulse (!) 56   Temp 98.3 F (36.8 C) (Oral)   Resp 18   Ht 5\' 5"  (1.651 m)   Wt 73.5 kg (162 lb)   SpO2 98%   BMI 26.96 kg/m    Final Clinical Impressions(s) / ED Diagnoses   Final diagnoses:  Pain of right thumb    ED Discharge Orders        Ordered    meloxicam (MOBIC) 7.5 MG tablet  Daily     10/16/17 0703     6:26 AM Patient here with recurrent right thumb pain worse with certain movement.  On exam, she has full range of motion throughout her thumb, no signs of infection, she is neurovascularly intact, she has normal strength, and her forearms are nontender.  Suspect muscular skeletal pain.  Will provide a Velcro splint for support. RICE therapy discussed.     Fayrene Helperran, Narciso Stoutenburg, PA-C 10/16/17 0704    Molpus, Jonny RuizJohn, MD 10/16/17 [phone]

## 2017-10-16 NOTE — ED Triage Notes (Signed)
Pt reports having right thumb pain that has been ongoing for the last week. Pt denies any injury.

## 2017-11-09 ENCOUNTER — Encounter: Payer: Self-pay | Admitting: Family Medicine

## 2017-11-09 ENCOUNTER — Ambulatory Visit: Payer: Medicaid Other | Admitting: Family Medicine

## 2017-11-09 VITALS — BP 110/60 | HR 70 | Temp 98.0°F | Resp 14 | Ht 65.0 in | Wt 164.0 lb

## 2017-11-09 DIAGNOSIS — F419 Anxiety disorder, unspecified: Secondary | ICD-10-CM | POA: Diagnosis not present

## 2017-11-09 DIAGNOSIS — F203 Undifferentiated schizophrenia: Secondary | ICD-10-CM | POA: Diagnosis not present

## 2017-11-09 DIAGNOSIS — G894 Chronic pain syndrome: Secondary | ICD-10-CM

## 2017-11-09 NOTE — Progress Notes (Signed)
Patient ID: Felicia Collier, female    DOB: [birth year], 61 y.o.   MRN: [id number]  PCP: Bing NeighborsHarris, Cherlyn Syring S, FNP  Chief Complaint  Patient presents with  . Follow-up    4 WEEKS    Subjective:  HPI Felicia Collier is a 61 y.o. female presents for evaluation chronic pain and anxiety. She reports today that she has [facility], Dr. Merlyn AlbertFred and has resumed Haldol shots. She was also prescribed Haldol pills which she reports that she is not taking as the haldol pills as this medication induces sleep. Reports that she doesn't need Haldol pills as she sleeps well at night. For now, she reports seeing Dr. Merlyn AlbertFred at least once every 4 weeks. Reports resolution of chronic pain symptoms. She feels less anxious since resuming Haldol, although she remains uncertain if she actually needs to continue the Haldol. Felicia Collier denies chest pain, shortness of breath, no recent hallucination or hearing voices, cough, or shortness of breath. Social History   Socioeconomic History  . Marital status: Single    Spouse name: Not on file  . Number of children: Not on file  . Years of education: Not on file  . Highest education level: Not on file  Social Needs  . Financial resource strain: Not on file  . Food insecurity - worry: Not on file  . Food insecurity - inability: Not on file  . Transportation needs - medical: Not on file  . Transportation needs - non-medical: Not on file  Occupational History  . Not on file  Tobacco Use  . Smoking status: Never Smoker  . Smokeless tobacco: Never Used  Substance and Sexual Activity  . Alcohol use: No  . Drug use: No  . Sexual activity: Not Currently  Other Topics Concern  . Not on file  Social History Narrative  . Not on file    Family History  Family history unknown: Yes   Review of Systems  Constitutional: Negative.   Respiratory: Negative.   Cardiovascular: Negative.   Neurological: Negative.   Psychiatric/Behavioral: Negative.      Patient Active Problem List   Diagnosis Date Noted  . Pain in left wrist 07/20/2017  . Schizophrenia (HCC) 05/01/2017  . Chronic pain of left knee 02/09/2017  . Unilateral primary osteoarthritis, left knee 02/09/2017  . Neuropathy 11/22/2016  . Gastroesophageal reflux disease without esophagitis 11/22/2016  . Weight gain 11/22/2016  . Atypical chest pain 11/04/2016    Allergies  Allergen Reactions  . Lidocaine     Face swelling     Prior to Admission medications   Medication Sig Start Date End Date Taking? Authorizing Provider  meloxicam (MOBIC) 7.5 MG tablet Take 1 tablet (7.5 mg total) by mouth daily. Patient not taking: Reported on 11/09/2017 10/16/17   Fayrene Helperran, [place], PA-C    Past Medical, Surgical Family and Social History reviewed and updated.    Objective:   Today's Vitals   11/09/17 0759  BP: 110/60  Pulse: 70  Resp: 14  Temp: 98 F (36.7 C)  TempSrc: Oral  SpO2: 98%  Weight: 164 lb (74.4 kg)  Height: 5\' 5"  (1.651 m)    Wt Readings from Last 3 Encounters:  11/09/17 164 lb (74.4 kg)  10/16/17 162 lb (73.5 kg)  10/10/17 164 lb (74.4 kg)   Physical Exam  Constitutional: She is oriented to person, place, and time. She appears well-developed and well-nourished.  Eyes: Conjunctivae are normal. Pupils are equal, round, and reactive to light.  Neck: Normal range of motion. Neck supple.  Cardiovascular: Normal rate, regular rhythm, normal heart sounds and intact distal pulses.  Pulmonary/Chest: Effort normal and breath sounds normal.  Abdominal: Soft. Bowel sounds are normal.  Musculoskeletal: Normal range of motion.  Lymphadenopathy:    She has no cervical adenopathy.  Neurological: She is alert and oriented to person, place, and time.  Skin: Skin is dry.  Psychiatric: She has a normal mood and affect. Her speech is normal. Thought content normal. She is not agitated, not aggressive, not hyperactive and not actively hallucinating. She expresses  inappropriate judgment.  Continues to resist the therapeutic need for antipsychotic medications.  She is attentive.   Assessment & Plan:  1. Undifferentiated schizophrenia (HCC), controlled and stable, mood appropriate. She has resumed follow-up with psychiatric [facility]. Currently receiving Haldol injections monthly. Next follow-up with Dr. Merlyn AlbertFred is 11/16/2016.    2. Anxiety, controlled, resolved today. Continue to monitor as subsequent visits, symptoms were likely stemming form unmanaged schizophrenia.    3. Chronic pain syndrome, controlled today. Continue to monitor as subsequent visits.  RTC: 6 weeks for evaluation of chronic conditions.  Godfrey PickKimberly S. Tiburcio PeaHarris, MSN, FNP-C [facility]  [address] [place], [place] [address] [phone]

## 2017-11-12 ENCOUNTER — Emergency Department (HOSPITAL_COMMUNITY)
Admission: EM | Admit: 2017-11-12 | Discharge: 2017-11-12 | Disposition: A | Discharge status: Home / Self Care | Payer: Medicaid Other | Source: Home / Self Care | Attending: Emergency Medicine | Admitting: Emergency Medicine

## 2017-11-12 ENCOUNTER — Other Ambulatory Visit: Payer: Self-pay

## 2017-11-12 ENCOUNTER — Encounter (HOSPITAL_COMMUNITY): Payer: Self-pay | Admitting: Emergency Medicine

## 2017-11-12 ENCOUNTER — Emergency Department (HOSPITAL_COMMUNITY)
Admission: EM | Admit: 2017-11-12 | Discharge: 2017-11-12 | Disposition: A | Discharge status: Home / Self Care | Payer: Medicaid Other | Attending: Emergency Medicine | Admitting: Emergency Medicine

## 2017-11-12 ENCOUNTER — Emergency Department (HOSPITAL_COMMUNITY): Payer: Medicaid Other

## 2017-11-12 DIAGNOSIS — N644 Mastodynia: Secondary | ICD-10-CM

## 2017-11-12 LAB — CBC
HCT: 40.4 % (ref 36.0–46.0)
HEMOGLOBIN: 12.7 g/dL (ref 12.0–15.0)
MCH: 25.9 pg — ABNORMAL LOW (ref 26.0–34.0)
MCHC: 31.4 g/dL (ref 30.0–36.0)
MCV: 82.4 fL (ref 78.0–100.0)
PLATELETS: 267 10*3/uL (ref 150–400)
RBC: 4.9 MIL/uL (ref 3.87–5.11)
RDW: 14.3 % (ref 11.5–15.5)
WBC: 10.6 10*3/uL — AB (ref 4.0–10.5)

## 2017-11-12 LAB — BASIC METABOLIC PANEL
ANION GAP: 8 (ref 5–15)
BUN: 8 mg/dL (ref 6–20)
CALCIUM: 8.7 mg/dL — AB (ref 8.9–10.3)
CHLORIDE: 107 mmol/L (ref 101–111)
CO2: 24 mmol/L (ref 22–32)
CREATININE: 0.7 mg/dL (ref 0.44–1.00)
GFR calc non Af Amer: >60 mL/min (ref >60)
Glucose, Bld: 91 mg/dL (ref 65–99)
Potassium: 3.3 mmol/L — ABNORMAL LOW (ref 3.5–5.1)
SODIUM: 139 mmol/L (ref 135–145)

## 2017-11-12 LAB — I-STAT TROPONIN, ED: TROPONIN I, POC: 0 ng/mL (ref 0.00–0.08)

## 2017-11-12 MED ORDER — NAPROXEN 375 MG PO TABS: 375.0000 mg | ORAL_TABLET | Freq: Two times a day (BID) | ORAL | 0 refills | Status: DC

## 2017-11-12 NOTE — ED Triage Notes (Signed)
Pt reports having pain in her left breast x3 days

## 2017-11-12 NOTE — Discharge Instructions (Signed)
Take the medication as directed and follow up with your doctor. Return here for worsening symptoms

## 2017-11-12 NOTE — ED Notes (Signed)
Pt c/o upper chest pain since Wednesday. Denies dizziness, reports a little bit SOB.

## 2017-11-12 NOTE — ED Provider Notes (Signed)
[facility]-EMERGENCY DEPT Provider Note   CSN: [id number] Arrival date & time: 11/12/17  1052     History   Chief Complaint Chief Complaint  Patient presents with  . Breast Pain    HPI Felicia Collier is a 61 y.o. female with hx of chronic pain and Schizophrenia who presents to the ED with left breast pain that started 3 days ago. Patient reports that she always has back pain and now it has dropped to the left breast in addition to being int the left upper back. She reports coming earlier and having a CXR. Patient reports that she has to come here often for the same problem. The pain increases with deep breath and movement.   HPI  Past Medical History:  Diagnosis Date  . Chronic knee pain   . Schizophrenia [facility](HCC)     Patient Active Problem List   Diagnosis Date Noted  . Pain in left wrist 07/20/2017  . Schizophrenia (HCC) 05/01/2017  . Chronic pain of left knee 02/09/2017  . Unilateral primary osteoarthritis, left knee 02/09/2017  . Neuropathy 11/22/2016  . Gastroesophageal reflux disease without esophagitis 11/22/2016  . Weight gain 11/22/2016  . Atypical chest pain 11/04/2016    Past Surgical History:  Procedure Laterality Date  . CHOLECYSTECTOMY      OB History    No data available       Home Medications    Prior to Admission medications   Medication Sig Start Date End Date Taking? Authorizing Provider  naproxen (NAPROSYN) 375 MG tablet Take 1 tablet (375 mg total) by mouth 2 (two) times daily. 11/12/17   Janne NapoleonNeese, Beyonka Pitney M, NP    Family History Family History  Family history unknown: Yes    Social History Social History   Tobacco Use  . Smoking status: Never Smoker  . Smokeless tobacco: Never Used  Substance Use Topics  . Alcohol use: No  . Drug use: No     Allergies   Lidocaine   Review of Systems Review of Systems  Constitutional: Negative for fever.  HENT: Negative.   Respiratory: Negative for chest tightness and  shortness of breath.   Cardiovascular: Chest pain: breast pain.  Gastrointestinal: Negative for nausea and vomiting.  Musculoskeletal: Positive for arthralgias and back pain.  Skin: Negative for wound.     Physical Exam Updated Vital Signs BP 128/77 (BP Location: Left Arm)   Pulse 75   Temp 98.1 F (36.7 C) (Oral)   Resp 18   Ht 5\' 5"  (1.651 m)   Wt 69.4 kg (153 lb)   SpO2 99%   BMI 25.46 kg/m   Physical Exam  Constitutional: She appears well-developed and well-nourished. No distress.  HENT:  Head: Normocephalic and atraumatic.  Mouth/Throat: Uvula is midline and oropharynx is clear and moist.  Eyes: Conjunctivae and EOM are normal. Pupils are equal, round, and reactive to light.  Neck: Neck supple.  Cardiovascular: Normal rate and regular rhythm.  Pulmonary/Chest: Effort normal and breath sounds normal. She exhibits tenderness (with deep breath and palpation). Left breast exhibits tenderness. Left breast exhibits no inverted nipple, no mass, no nipple discharge and no skin change.  No axillary tenderness or mass palpated.    Abdominal: Soft. There is no tenderness.  Musculoskeletal: Normal range of motion.  Neurological: She is alert.  Skin: Skin is warm and dry.  Nursing note and vitals reviewed.    ED Treatments / Results  Labs (all labs ordered are listed, but only abnormal  results are displayed) Labs Reviewed - No data to display   Radiology Dg Chest 2 View  Result Date: 11/12/2017 CLINICAL DATA:  61 y/o  F; upper chest pain and shortness of breath. EXAM: CHEST  2 VIEW COMPARISON:  11/03/2016 chest radiograph FINDINGS: Normal cardiac silhouette. Cholecystectomy clips in right upper quadrant. Clear lungs. No pleural effusion or pneumothorax. Mild multilevel discogenic degenerative changes of thoracic spine. IMPRESSION: No acute pulmonary process identified. Electronically Signed   By: Mitzi HansenLance  Furusawa-Stratton M.D.   On: 11/12/2017 03:13     Procedures Procedures (including critical care time)  Medications Ordered in ED Medications - No data to display   Initial Impression / Assessment and Plan / ED Course  I have reviewed the triage vital signs and the nursing notes. 61 y.o. female here today with c/o pain that is similar to pain she often has. No cardiac symptoms. Stable for d/c to f/u with her PCP, no breast mass palpated, no shortness of breath. Discussed with patient importance of f/u with her PCP. Return precautions discussed.   Final Clinical Impressions(s) / ED Diagnoses   Final diagnoses:  Breast pain, left    ED Discharge Orders        Ordered    naproxen (NAPROSYN) 375 MG tablet  2 times daily     11/12/17 [address]1504       Sevan Mcbroom, Charlotte ParkHope M, TexasNP 11/12/17 1513    Pricilla LovelessGoldston, Scott, MD 11/12/17 1702

## 2017-11-16 ENCOUNTER — Ambulatory Visit (INDEPENDENT_AMBULATORY_CARE_PROVIDER_SITE_OTHER): Payer: Medicaid Other | Admitting: Orthopaedic Surgery

## 2017-11-16 ENCOUNTER — Encounter (INDEPENDENT_AMBULATORY_CARE_PROVIDER_SITE_OTHER): Payer: Self-pay | Admitting: Orthopaedic Surgery

## 2017-11-16 DIAGNOSIS — M25532 Pain in left wrist: Secondary | ICD-10-CM | POA: Diagnosis not present

## 2017-11-16 DIAGNOSIS — M1712 Unilateral primary osteoarthritis, left knee: Secondary | ICD-10-CM | POA: Diagnosis not present

## 2017-11-16 NOTE — Progress Notes (Signed)
The patient comes in today with the same kind of pain that she is always had which she is on and off in severity with her left wrist and her left knee.  She is not interested in any type of injections today.  She still wears a wrist splint on occasion her left wrist.  She still goes to the emergency room multiple times a month for multiple complaints.  From a surgical standpoint I would not recommend any type of surgery for her because I think it would always be a compliance issue and there is nothing that I would recommend emergently or urgently for her at all.  We had a long and thorough discussion about her wrist and her knee and there is really nothing else that I have to offer other than an occasional injection which she is deferred today.  On exam her left wrist is stiff with flexion extension.  Her left knee shows no effusion with full range of motion with some medial joint line tenderness.  There is no locking catching with her left knee and her McMurray's and Lachman's are negative.

## 2017-11-17 ENCOUNTER — Encounter: Payer: Self-pay | Admitting: Family Medicine

## 2017-11-17 ENCOUNTER — Ambulatory Visit: Payer: Medicaid Other | Admitting: Family Medicine

## 2017-11-17 VITALS — BP 130/86 | HR 80 | Temp 98.3°F | Resp 16 | Ht 65.0 in | Wt 165.0 lb

## 2017-11-17 DIAGNOSIS — N644 Mastodynia: Secondary | ICD-10-CM | POA: Diagnosis not present

## 2017-11-17 MED ORDER — MELOXICAM 7.5 MG PO TABS: 7.5000 mg | ORAL_TABLET | Freq: Every day | ORAL | 0 refills | Status: DC

## 2017-11-17 NOTE — Patient Instructions (Signed)

## 2017-11-17 NOTE — Progress Notes (Signed)
Patient ID: Pamlea Collier, female    DOB: 1955/11/25, 62 y.o.   MRN: [id number]  PCP: Bing Neighbors, FNP  Chief Complaint  Patient presents with  . Breast Pain    Subjective:  HPI Felicia Collier is a 62 y.o. female presents for evaluation of left breast pain. Medical problems significant for schizophrenia with psychotic features and chronic pain. Evalyse presented recently to ED for a complaint of left breast pain. She has had a normal breast mammogram this year. No family history of breast cancer. She only attempted NSAID with two days or warm/cool compresses at home.  She reports that the Naproxen caused "stuffy nose and drowsiness." Reports that something fell from her shoulder down into her left breast and that is what is causing the pain.  Denies shortness of breath, chest pain, or cough.   Social History   Socioeconomic History  . Marital status: Single    Spouse name: Not on file  . Number of children: Not on file  . Years of education: Not on file  . Highest education level: Not on file  Social Needs  . Financial resource strain: Not on file  . Food insecurity - worry: Not on file  . Food insecurity - inability: Not on file  . Transportation needs - medical: Not on file  . Transportation needs - non-medical: Not on file  Occupational History  . Not on file  Tobacco Use  . Smoking status: Never Smoker  . Smokeless tobacco: Never Used  Substance and Sexual Activity  . Alcohol use: No  . Drug use: No  . Sexual activity: Not Currently  Other Topics Concern  . Not on file  Social History Narrative  . Not on file    Family History  Family history unknown: Yes   Review of Systems  Constitutional: Negative.   Respiratory:       Breast pain left   Cardiovascular: Negative.   Skin: Negative.   Neurological: Negative.   Psychiatric/Behavioral: Negative.     Patient Active Problem List   Diagnosis Date Noted  . Pain in left wrist 07/20/2017  . Schizophrenia  (HCC) 05/01/2017  . Chronic pain of left knee 02/09/2017  . Unilateral primary osteoarthritis, left knee 02/09/2017  . Neuropathy 11/22/2016  . Gastroesophageal reflux disease without esophagitis 11/22/2016  . Weight gain 11/22/2016  . Atypical chest pain 11/04/2016    Allergies  Allergen Reactions  . Lidocaine     Face swelling     Prior to Admission medications   Medication Sig Start Date End Date Taking? Authorizing Provider  meloxicam (MOBIC) 7.5 MG tablet Take 7.5 mg by mouth daily. 10/16/17   [provider]  naproxen (NAPROSYN) 375 MG tablet Take 1 tablet (375 mg total) by mouth 2 (two) times daily. Patient not taking: Reported on 11/17/2017 11/12/17   Janne Napoleon, NP    Past Medical, Surgical Family and Social History reviewed and updated.    Objective:   Today's Vitals   11/17/17 0755  BP: 130/86  Pulse: 80  Resp: 16  Temp: 98.3 F (36.8 C)  TempSrc: Oral  SpO2: 97%  Weight: 165 lb (74.8 kg)  Height: 5\' 5"  (1.651 m)    Wt Readings from Last 3 Encounters:  11/17/17 165 lb (74.8 kg)  11/12/17 153 lb (69.4 kg)  11/12/17 153 lb 11.2 oz (69.7 kg)    Physical Exam  Constitutional: She is oriented to person, place, and time. She appears well-developed and well-nourished.  HENT:  Head: Normocephalic and atraumatic.  Right Ear: External ear normal.  Left Ear: External ear normal.  Nose: Nose normal.  Mouth/Throat: Oropharynx is clear and moist.  Eyes: Conjunctivae and EOM are normal. Pupils are equal, round, and reactive to light.  Neck: Normal range of motion. Neck supple.  Cardiovascular: Normal rate, regular rhythm, normal heart sounds and intact distal pulses.  Pulmonary/Chest: Effort normal and breath sounds normal.  Pain below the left breast line   Abdominal: Soft. Bowel sounds are normal.  Musculoskeletal: Normal range of motion.  Lymphadenopathy:    She has no cervical adenopathy.  Neurological: She is alert and oriented to person,  place, and time.  Skin: Skin is warm and dry.  Psychiatric: She has a normal mood and affect. Her behavior is normal. Judgment and thought content normal.   Assessment & Plan:  1. Breast pain, left- Recent normal mammogram. Did not complete recommended NSAID therapy due to intolerance of Naproxen. Will trial Meloxicam and warm compresses to relieve pain. Recommended loose fitting clothing to reduce skin inflammation.  Return for care in 2 weeks to evaluate     Godfrey PickKimberly S. Tiburcio PeaHarris, MSN, FNP-C [facility]  [address] [place], [place] [address] [phone]

## 2017-11-20 ENCOUNTER — Other Ambulatory Visit: Payer: Self-pay

## 2017-11-20 ENCOUNTER — Encounter (HOSPITAL_COMMUNITY): Payer: Self-pay | Admitting: Emergency Medicine

## 2017-11-20 ENCOUNTER — Emergency Department (HOSPITAL_COMMUNITY)
Admission: EM | Admit: 2017-11-20 | Discharge: 2017-11-20 | Disposition: A | Discharge status: Home / Self Care | Payer: Medicaid Other | Attending: Emergency Medicine | Admitting: Emergency Medicine

## 2017-11-20 DIAGNOSIS — N644 Mastodynia: Secondary | ICD-10-CM | POA: Diagnosis not present

## 2017-11-20 DIAGNOSIS — R0789 Other chest pain: Secondary | ICD-10-CM | POA: Diagnosis present

## 2017-11-20 NOTE — Discharge Instructions (Signed)
Please follow-up with your primary care provider.  I recommend that you follow-up with mammogram given your chest wall pain and breast tenderness.  Please return with any new or worsening signs or symptoms.

## 2017-11-20 NOTE — ED Triage Notes (Signed)
Pt reports having pain in left breast for the last week. Pt was seen last week for same.

## 2017-11-20 NOTE — ED Provider Notes (Signed)
[facility]-EMERGENCY DEPT Provider Note   CSN: [id number] Arrival date & time: 11/20/17  0205     History   Chief Complaint Chief Complaint  Patient presents with  . Breast Pain    HPI Felicia Collier is a 62 y.o. female.  HPI   62 year old female presents today with complaints of breast pain.  She has been seen numerous times for similar presentations, 25 times in the last 6 months for various presentations.  Patient notes pain to the left lower chest wall under her left breast.  She notes this is been present for several weeks.  She notes worsening pain with palpation, movement.  She denies any significant shortness of breath or any other chest pain.  She reports she has been seen by her primary care provider for this as well.  Patient notes no medications prior to arrival.  No other complaints.  Past Medical History:  Diagnosis Date  . Chronic knee pain   . Schizophrenia [facility])     Patient Active Problem List   Diagnosis Date Noted  . Pain in left wrist 07/20/2017  . Schizophrenia (HCC) 05/01/2017  . Chronic pain of left knee 02/09/2017  . Unilateral primary osteoarthritis, left knee 02/09/2017  . Neuropathy 11/22/2016  . Gastroesophageal reflux disease without esophagitis 11/22/2016  . Weight gain 11/22/2016  . Atypical chest pain 11/04/2016    Past Surgical History:  Procedure Laterality Date  . ABDOMINAL HYSTERECTOMY    . CHOLECYSTECTOMY      OB History    No data available       Home Medications    Prior to Admission medications   Medication Sig Start Date End Date Taking? Authorizing Provider  meloxicam (MOBIC) 7.5 MG tablet Take 1 tablet (7.5 mg total) by mouth daily. 11/17/17  Yes Bing Neighbors, FNP  naproxen (NAPROSYN) 375 MG tablet Take 1 tablet (375 mg total) by mouth 2 (two) times daily. Patient not taking: Reported on 11/17/2017 11/12/17   Janne Napoleon, NP    Family History Family History  Family history unknown: Yes     Social History Social History   Tobacco Use  . Smoking status: Never Smoker  . Smokeless tobacco: Never Used  Substance Use Topics  . Alcohol use: No  . Drug use: No     Allergies   Lidocaine   Review of Systems Review of Systems  All other systems reviewed and are negative.    Physical Exam Updated Vital Signs BP (!) 152/70 (BP Location: Right Arm)   Pulse 76   Temp 98 F (36.7 C) (Oral)   Resp 17   Ht 5\' 5"  (1.651 m)   Wt 73.9 kg (163 lb)   SpO2 100%   BMI 27.12 kg/m   Physical Exam  Constitutional: She is oriented to person, place, and time. She appears well-developed and well-nourished.  HENT:  Head: Normocephalic and atraumatic.  Eyes: Conjunctivae are normal. Pupils are equal, round, and reactive to light. Right eye exhibits no discharge. Left eye exhibits no discharge. No scleral icterus.  Neck: Normal range of motion. No JVD present. No tracheal deviation present.  Cardiovascular: Normal rate and regular rhythm.  Pulmonary/Chest: Effort normal and breath sounds normal. No stridor. No respiratory distress. She has no wheezes. She has no rales. She exhibits no tenderness.  Left breast without masses or nodules, tenderness to palpation under left breast-no rash or warmth   Neurological: She is alert and oriented to person, place, and time.  Coordination normal.  Psychiatric: She has a normal mood and affect. Her behavior is normal. Judgment and thought content normal.  Nursing note and vitals reviewed.    ED Treatments / Results  Labs (all labs ordered are listed, but only abnormal results are displayed) Labs Reviewed - No data to display  EKG  EKG Interpretation  Date/Time:  Sunday November 20 2017 02:22:55 EST Ventricular Rate:  58 PR Interval:    QRS Duration: 98 QT Interval:  454 QTC Calculation: 446 R Axis:   21 Text Interpretation:  Sinus rhythm No significant change since last tracing Confirmed by Zadie RhineWickline, Donald ([id number]) on 11/20/2017  2:26:52 AM       Radiology No results found.  Procedures Procedures (including critical care time)  Medications Ordered in ED Medications - No data to display   Initial Impression / Assessment and Plan / ED Course  I have reviewed the triage vital signs and the nursing notes.  Pertinent labs & imaging results that were available during my care of the patient were reviewed by me and considered in my medical decision making (see chart for details).    [age] year old female presents today with complaints of chest wall pain.  Patient has been seen numerous times for similar presentations.  This does not appear to be cardiac or pulmonary, this is likely the chest wall.  Given her location of symptoms I highly encouraged her to follow-up with primary care for mammogram.  Patient verbalized understanding and agreement to today's plan.  She was given strict return precautions.  Final Clinical Impressions(s) / ED Diagnoses   Final diagnoses:  Chest wall pain    ED Discharge Orders    None       Rosalio LoudHedges, Rachael Zapanta, PA-C 11/20/17 0645    Zadie RhineWickline, Donald, MD 11/20/17 0700

## 2017-11-21 ENCOUNTER — Ambulatory Visit (INDEPENDENT_AMBULATORY_CARE_PROVIDER_SITE_OTHER): Payer: Medicaid Other | Admitting: Family Medicine

## 2017-11-21 ENCOUNTER — Other Ambulatory Visit: Payer: Self-pay | Admitting: Family Medicine

## 2017-11-21 ENCOUNTER — Encounter: Payer: Self-pay | Admitting: Family Medicine

## 2017-11-21 VITALS — BP 124/70 | HR 60 | Temp 98.7°F | Resp 14 | Ht 65.0 in | Wt 167.0 lb

## 2017-11-21 DIAGNOSIS — N644 Mastodynia: Secondary | ICD-10-CM

## 2017-11-21 NOTE — Progress Notes (Signed)
Patient seen in ED twice and here in office once for persistent left breast pain. US left breast ordered. Patient to call and schedule appointment.  Godfrey PickKimberly S. Tiburcio PeaHarris, MSN, FNP-C [facility]  [address] [place], [place] [address] [phone]

## 2017-11-21 NOTE — Progress Notes (Signed)
Patient only needed a referral from recent office visit for a diagnostic mammogram.

## 2017-11-25 ENCOUNTER — Ambulatory Visit
Admission: RE | Admit: 2017-11-25 | Discharge: 2017-11-25 | Disposition: A | Discharge status: Home / Self Care | Payer: Medicaid Other | Source: Ambulatory Visit | Attending: Family Medicine | Admitting: Family Medicine

## 2017-11-25 ENCOUNTER — Ambulatory Visit: Payer: Medicaid Other

## 2017-11-25 DIAGNOSIS — N644 Mastodynia: Secondary | ICD-10-CM

## 2017-12-07 ENCOUNTER — Ambulatory Visit: Payer: Medicaid Other | Admitting: Family Medicine

## 2017-12-07 ENCOUNTER — Encounter: Payer: Self-pay | Admitting: Family Medicine

## 2017-12-07 VITALS — BP 118/86 | HR 66 | Temp 98.0°F | Ht 65.0 in | Wt 165.4 lb

## 2017-12-07 DIAGNOSIS — N3000 Acute cystitis without hematuria: Secondary | ICD-10-CM | POA: Insufficient documentation

## 2017-12-07 DIAGNOSIS — R82998 Other abnormal findings in urine: Secondary | ICD-10-CM | POA: Diagnosis not present

## 2017-12-07 DIAGNOSIS — R300 Dysuria: Secondary | ICD-10-CM | POA: Diagnosis present

## 2017-12-07 DIAGNOSIS — R1084 Generalized abdominal pain: Secondary | ICD-10-CM | POA: Diagnosis not present

## 2017-12-07 LAB — POCT URINALYSIS DIP (DEVICE)
BILIRUBIN URINE: NEGATIVE
Glucose, UA: NEGATIVE mg/dL
Hgb urine dipstick: NEGATIVE
KETONES UR: NEGATIVE mg/dL
Nitrite: NEGATIVE
Protein, ur: NEGATIVE mg/dL
Urobilinogen, UA: 0.2 mg/dL (ref 0.0–1.0)
pH: 6 (ref 5.0–8.0)

## 2017-12-07 NOTE — Patient Instructions (Addendum)
For abdominal pain tylenol 500 mg every 8 hours as needed. If symptoms worsen return for care. I am sending urine off to be cultured. If bacteria is present will treat with antibiotic. Will follow-up with you by phone      Abdominal Pain, Adult Many things can cause belly (abdominal) pain. Most times, belly pain is not dangerous. Many cases of belly pain can be watched and treated at home. Sometimes belly pain is serious, though. Your doctor will try to find the cause of your belly pain. Follow these instructions at home:  Take over-the-counter and prescription medicines only as told by your doctor. Do not take medicines that help you poop (laxatives) unless told to by your doctor.  Drink enough fluid to keep your pee (urine) clear or pale yellow.  Watch your belly pain for any changes.  Keep all follow-up visits as told by your doctor. This is important. Contact a doctor if:  Your belly pain changes or gets worse.  You are not hungry, or you lose weight without trying.  You are having trouble pooping (constipated) or have watery poop (diarrhea) for more than 2-3 days.  You have pain when you pee or poop.  Your belly pain wakes you up at night.  Your pain gets worse with meals, after eating, or with certain foods.  You are throwing up and cannot keep anything down.  You have a fever. Get help right away if:  Your pain does not go away as soon as your doctor says it should.  You cannot stop throwing up.  Your pain is only in areas of your belly, such as the right side or the left lower part of the belly.  You have bloody or black poop, or poop that looks like tar.  You have very bad pain, cramping, or bloating in your belly.  You have signs of not having enough fluid or water in your body (dehydration), such as: ? Dark pee, very little pee, or no pee. ? Cracked lips. ? Dry mouth. ? Sunken eyes. ? Sleepiness. ? Weakness. This information is not intended to replace  advice given to you by your health care provider. Make sure you discuss any questions you have with your health care provider. Document Released: 04/19/2008 Document Revised: 05/21/2016 Document Reviewed: 04/14/2016 Elsevier Interactive Patient Education  2018 [employer]

## 2017-12-07 NOTE — Progress Notes (Signed)
Patient ID: Felicia Collier, female    DOB: 12-27-1955, 62 y.o.   MRN: [id number]  PCP: Bing Neighbors, FNP  Chief Complaint  Patient presents with  . Follow-up    breast pain  . Abdominal Pain    Subjective:  HPI Felicia Collier is a 62 y.o. female with GERD, Schizophrenia, and Osteoarthritis  presents for evaluation of abdominal pain. This is a chronic, ongoing, problem. Abdominal pain is generalized, has been a current problem since yesterday.  She denies diarrhea, constipation, nausea, vomiting, dysuria, or urine odor. Continues to eat regular meals without worsening abdominal pain. She hasn't taken any medication for abdominal pain. Referred last year for colonoscopy, however refused to have procedure completed due to fear of procedure. Denies shortness of breath, chest pain, or dizziness.  Social History   Socioeconomic History  . Marital status: Single    Spouse name: Not on file  . Number of children: Not on file  . Years of education: Not on file  . Highest education level: Not on file  Social Needs  . Financial resource strain: Not on file  . Food insecurity - worry: Not on file  . Food insecurity - inability: Not on file  . Transportation needs - medical: Not on file  . Transportation needs - non-medical: Not on file  Occupational History  . Not on file  Tobacco Use  . Smoking status: Never Smoker  . Smokeless tobacco: Never Used  Substance and Sexual Activity  . Alcohol use: No  . Drug use: No  . Sexual activity: Not Currently  Other Topics Concern  . Not on file  Social History Narrative  . Not on file    Family History  Problem Relation Age of Onset  . Other Mother        no health conditions per patient  . Other Father        no health conditions per patient   Review of Systems  Constitutional: Negative.   Respiratory: Positive for apnea.   Cardiovascular: Negative.   Gastrointestinal: Positive for abdominal pain. Negative for abdominal distention,  anal bleeding, blood in stool, constipation, diarrhea, nausea, rectal pain and vomiting.  Musculoskeletal: Positive for arthralgias.  Skin: Negative.   Neurological: Negative.   Psychiatric/Behavioral: Negative.    Patient Active Problem List   Diagnosis Date Noted  . Pain in left wrist 07/20/2017  . Schizophrenia (HCC) 05/01/2017  . Chronic pain of left knee 02/09/2017  . Unilateral primary osteoarthritis, left knee 02/09/2017  . Neuropathy 11/22/2016  . Gastroesophageal reflux disease without esophagitis 11/22/2016  . Weight gain 11/22/2016  . Atypical chest pain 11/04/2016    Allergies  Allergen Reactions  . Lidocaine     Face swelling     Prior to Admission medications   Medication Sig Start Date End Date Taking? Authorizing Provider  meloxicam (MOBIC) 7.5 MG tablet Take 1 tablet (7.5 mg total) by mouth daily. Patient not taking: Reported on 11/21/2017 11/17/17   Bing Neighbors, FNP  naproxen (NAPROSYN) 375 MG tablet Take 1 tablet (375 mg total) by mouth 2 (two) times daily. Patient not taking: Reported on 11/17/2017 11/12/17   Janne Napoleon, NP    Past Medical, Surgical Family and Social History reviewed and updated.    Objective:   Today's Vitals   12/07/17 0807  BP: 118/86  Pulse: 66  Temp: 98 F (36.7 C)  TempSrc: Oral  SpO2: 99%  Weight: 165 lb 6.4 oz (75 kg)  Height:  5\' 5"  (1.651 m)    Wt Readings from Last 3 Encounters:  12/07/17 165 lb 6.4 oz (75 kg)  11/21/17 167 lb (75.8 kg)  11/20/17 163 lb (73.9 kg)   Physical Exam  Constitutional: She is oriented to person, place, and time. She appears well-developed and well-nourished.  HENT:  Head: Normocephalic and atraumatic.  Eyes: Conjunctivae and EOM are normal. Pupils are equal, round, and reactive to light.  Neck: Normal range of motion. Neck supple.  Cardiovascular: Normal rate, regular rhythm, normal heart sounds and intact distal pulses.  Pulmonary/Chest: Effort normal and breath sounds normal.   Abdominal: Soft. Bowel sounds are normal. She exhibits no distension and no mass. There is no tenderness. There is no rebound and no guarding.  Musculoskeletal: Normal range of motion.  Neurological: She is alert and oriented to person, place, and time.  Psychiatric: She has a normal mood and affect. Her behavior is normal. Judgment and thought content normal.   Assessment & Plan:  1. Generalized abdominal pain 2. Leukocytes in urine Abdominal pain, chronic on-going, intermittent problem. Current course of abdominal pain, occurring only over the last 24 hours. Urine dipstick is positive for small leucocytes only. She is asymptomatic. Will order a urine culture. For now, recommend Tylenol 500 mg every 8 hours for abdominal pain. Will notify via phone regarding lab results. Return for care if symptoms worsen or do not improve.   Orders Placed This Encounter  Procedures  . Urine Culture  . POCT urinalysis dip (device)    Godfrey PickKimberly S. Tiburcio PeaHarris, MSN, FNP-C [facility]  [address] [place], [place] [address] [phone]

## 2017-12-08 ENCOUNTER — Encounter (HOSPITAL_COMMUNITY): Payer: Self-pay

## 2017-12-08 ENCOUNTER — Other Ambulatory Visit: Payer: Self-pay

## 2017-12-08 ENCOUNTER — Emergency Department (HOSPITAL_COMMUNITY)
Admission: EM | Admit: 2017-12-08 | Discharge: 2017-12-08 | Disposition: A | Discharge status: Home / Self Care | Payer: Medicaid Other | Attending: Emergency Medicine | Admitting: Emergency Medicine

## 2017-12-08 DIAGNOSIS — N3000 Acute cystitis without hematuria: Secondary | ICD-10-CM

## 2017-12-08 LAB — URINALYSIS, ROUTINE W REFLEX MICROSCOPIC
Bilirubin Urine: NEGATIVE
GLUCOSE, UA: NEGATIVE mg/dL
Hgb urine dipstick: NEGATIVE
Ketones, ur: NEGATIVE mg/dL
Nitrite: NEGATIVE
PH: 5 (ref 5.0–8.0)
Protein, ur: NEGATIVE mg/dL
SPECIFIC GRAVITY, URINE: 1.013 (ref 1.005–1.030)

## 2017-12-08 MED ORDER — CEPHALEXIN 500 MG PO CAPS: 500.0000 mg | ORAL_CAPSULE | Freq: Four times a day (QID) | ORAL | 0 refills | Status: DC

## 2017-12-08 MED ORDER — CEPHALEXIN 500 MG PO CAPS
500.0000 mg | ORAL_CAPSULE | Freq: Once | ORAL | Status: AC
Start: 2017-12-08 — End: 2017-12-08
  Administered 2017-12-08: 500 mg via ORAL
  Filled 2017-12-08: qty 1

## 2017-12-08 NOTE — ED Triage Notes (Addendum)
Pt reports burning with urination starting yesterday. Denies discharge. She is also reporting bright red blood with her bowel movements.  A&Ox4. Ambulatory.

## 2017-12-08 NOTE — ED Provider Notes (Signed)
[facility]-EMERGENCY DEPT Provider Note   CSN: [id number] Arrival date & time: 12/07/17  2233     History   Chief Complaint Chief Complaint  Patient presents with  . Dysuria    HPI Felicia Collier is a 62 y.o. female.  The history is provided by the patient.  Dysuria   This is a new problem. The current episode started yesterday. The problem occurs every urination. The problem has not changed since onset.The quality of the pain is described as burning. The pain is moderate. There has been no fever. There is no history of pyelonephritis. Pertinent negatives include no chills, no vomiting and no flank pain. She has tried nothing for the symptoms. Her past medical history does not include kidney stones.    Past Medical History:  Diagnosis Date  . Chronic knee pain   . Schizophrenia [facility](HCC)     Patient Active Problem List   Diagnosis Date Noted  . Pain in left wrist 07/20/2017  . Schizophrenia (HCC) 05/01/2017  . Chronic pain of left knee 02/09/2017  . Unilateral primary osteoarthritis, left knee 02/09/2017  . Neuropathy 11/22/2016  . Gastroesophageal reflux disease without esophagitis 11/22/2016  . Weight gain 11/22/2016  . Atypical chest pain 11/04/2016    Past Surgical History:  Procedure Laterality Date  . ABDOMINAL HYSTERECTOMY    . CHOLECYSTECTOMY      OB History    No data available       Home Medications    Prior to Admission medications   Medication Sig Start Date End Date Taking? Authorizing Provider  acetaminophen (TYLENOL) 500 MG tablet Take 1,000 mg by mouth every 6 (six) hours as needed for mild pain.   Yes [provider]  meloxicam (MOBIC) 7.5 MG tablet Take 1 tablet (7.5 mg total) by mouth daily. Patient not taking: Reported on 11/21/2017 11/17/17   Bing NeighborsHarris, Kimberly S, FNP  naproxen (NAPROSYN) 375 MG tablet Take 1 tablet (375 mg total) by mouth 2 (two) times daily. Patient not taking: Reported on 11/17/2017 11/12/17    Janne NapoleonNeese, Hope M, NP    Family History Family History  Problem Relation Age of Onset  . Other Mother        no health conditions per patient  . Other Father        no health conditions per patient    Social History Social History   Tobacco Use  . Smoking status: Never Smoker  . Smokeless tobacco: Never Used  Substance Use Topics  . Alcohol use: No  . Drug use: No     Allergies   Lidocaine   Review of Systems Review of Systems  Constitutional: Negative for chills and fever.  Gastrointestinal: Negative for vomiting.  Genitourinary: Positive for dysuria. Negative for flank pain and genital sores.  All other systems reviewed and are negative.    Physical Exam Updated Vital Signs BP (!) 144/119 (BP Location: Left Arm)   Pulse 70   Temp 98.2 F (36.8 C) (Oral)   Resp 18   Ht 5\' 5"  (1.651 m)   Wt 74.8 kg (165 lb)   SpO2 100%   BMI 27.46 kg/m   Physical Exam  Constitutional: She is oriented to person, place, and time. She appears well-developed and well-nourished. No distress.  HENT:  Head: Normocephalic and atraumatic.  Mouth/Throat: No oropharyngeal exudate.  Eyes: Conjunctivae are normal. Pupils are equal, round, and reactive to light.  Neck: Normal range of motion. Neck supple.  Cardiovascular: Normal rate,  regular rhythm, normal heart sounds and intact distal pulses.  Pulmonary/Chest: Effort normal and breath sounds normal. No stridor. She has no wheezes. She has no rales.  Abdominal: Soft. Bowel sounds are normal. She exhibits no mass. There is no tenderness. There is no rebound and no guarding.  Musculoskeletal: Normal range of motion.  Neurological: She is alert and oriented to person, place, and time. She displays normal reflexes.  Skin: Skin is warm and dry. Capillary refill takes less than 2 seconds.  Psychiatric: She has a normal mood and affect.     ED Treatments / Results  Labs (all labs ordered are listed, but only abnormal results are  displayed)  Results for orders placed or performed during the hospital encounter of 12/08/17  Urinalysis, Routine w reflex microscopic  Result Value Ref Range   Color, Urine YELLOW YELLOW   APPearance CLEAR CLEAR   Specific Gravity, Urine 1.013 1.005 - 1.030   pH 5.0 5.0 - 8.0   Glucose, UA NEGATIVE NEGATIVE mg/dL   Hgb urine dipstick NEGATIVE NEGATIVE   Bilirubin Urine NEGATIVE NEGATIVE   Ketones, ur NEGATIVE NEGATIVE mg/dL   Protein, ur NEGATIVE NEGATIVE mg/dL   Nitrite NEGATIVE NEGATIVE   Leukocytes, UA LARGE (A) NEGATIVE   RBC / HPF 0-5 0 - 5 RBC/hpf   WBC, UA 6-30 0 - 5 WBC/hpf   Bacteria, UA RARE (A) NONE SEEN   Squamous Epithelial / LPF 0-5 (A) NONE SEEN   Mucus PRESENT    Dg Chest 2 View  Result Date: 11/12/2017 CLINICAL DATA:  62 y/o  F; upper chest pain and shortness of breath. EXAM: CHEST  2 VIEW COMPARISON:  11/03/2016 chest radiograph FINDINGS: Normal cardiac silhouette. Cholecystectomy clips in right upper quadrant. Clear lungs. No pleural effusion or pneumothorax. Mild multilevel discogenic degenerative changes of thoracic spine. IMPRESSION: No acute pulmonary process identified. Electronically Signed   By: Mitzi Hansen M.D.   On: 11/12/2017 03:13   Mm Diag Breast Tomo Uni Left  Result Date: 11/25/2017 CLINICAL DATA:  Patient presents complaining left breast aching. No reported lumps. EXAM: 2D DIGITAL DIAGNOSTIC UNILATERAL LEFT MAMMOGRAM WITH CAD AND ADJUNCT TOMO COMPARISON:  Previous exam(s). ACR Breast Density Category b: There are scattered areas of fibroglandular density. FINDINGS: There are no discrete masses, areas of significant asymmetry, areas of architectural distortion or suspicious calcifications. Mammographic images were processed with CAD. IMPRESSION: Negative exam.  No evidence of malignancy. RECOMMENDATION: Screening mammogram in [place] 2019 per normal screening schedule.(Code:SM-B-01Y) I have discussed the findings and recommendations with the  patient. Results were also provided in writing at the conclusion of the visit. If applicable, a reminder letter will be sent to the patient regarding the next appointment. BI-RADS CATEGORY  1: Negative. Electronically Signed   By: Amie Portland M.D.   On: 11/25/2017 10:10    Procedures Procedures (including critical care time)  Medications Ordered in ED Medications  cephALEXin (KEFLEX) capsule 500 mg (500 mg Oral Given 12/08/17 0204)      Final Clinical Impressions(s) / ED Diagnoses   Return for worsening pain, vomiting blood inability to pass urine,  fevers > 100.4 unrelieved by medication, shortness of breath, intractable vomiting, or diarrhea, abdominal pain, Inability to tolerate liquids or food, cough, altered mental status or any concerns. No signs of systemic illness or infection. The patient is nontoxic-appearing on exam and vital signs are within normal limits.    I have reviewed the triage vital signs and the nursing notes. Pertinent labs &imaging  results that were available during my care of the patient were reviewed by me and considered in my medical decision making (see chart for details).  After history, exam, and medical workup I feel the patient has been appropriately medically screened and is safe for discharge home. Pertinent diagnoses were discussed with the patient. Patient was given return precautions.   Cereniti Curb, MD 12/08/17 1610

## 2017-12-09 LAB — URINE CULTURE

## 2017-12-15 ENCOUNTER — Telehealth: Payer: Self-pay

## 2017-12-15 ENCOUNTER — Encounter (HOSPITAL_COMMUNITY): Payer: Self-pay

## 2017-12-15 ENCOUNTER — Emergency Department (HOSPITAL_COMMUNITY)
Admission: EM | Admit: 2017-12-15 | Discharge: 2017-12-15 | Disposition: A | Discharge status: Home / Self Care | Payer: Medicaid Other | Attending: Emergency Medicine | Admitting: Emergency Medicine

## 2017-12-15 ENCOUNTER — Other Ambulatory Visit: Payer: Self-pay

## 2017-12-15 DIAGNOSIS — R197 Diarrhea, unspecified: Secondary | ICD-10-CM | POA: Diagnosis not present

## 2017-12-15 DIAGNOSIS — N3090 Cystitis, unspecified without hematuria: Secondary | ICD-10-CM | POA: Insufficient documentation

## 2017-12-15 DIAGNOSIS — Z9049 Acquired absence of other specified parts of digestive tract: Secondary | ICD-10-CM | POA: Insufficient documentation

## 2017-12-15 DIAGNOSIS — R1013 Epigastric pain: Secondary | ICD-10-CM | POA: Diagnosis present

## 2017-12-15 DIAGNOSIS — R195 Other fecal abnormalities: Secondary | ICD-10-CM | POA: Insufficient documentation

## 2017-12-15 DIAGNOSIS — F209 Schizophrenia, unspecified: Secondary | ICD-10-CM | POA: Insufficient documentation

## 2017-12-15 LAB — COMPREHENSIVE METABOLIC PANEL
ALBUMIN: 3.9 g/dL (ref 3.5–5.0)
ALT: 13 U/L — AB (ref 14–54)
AST: 18 U/L (ref 15–41)
Alkaline Phosphatase: 95 U/L (ref 38–126)
Anion gap: 6 (ref 5–15)
BUN: 11 mg/dL (ref 6–20)
CHLORIDE: 102 mmol/L (ref 101–111)
CO2: 29 mmol/L (ref 22–32)
CREATININE: 0.84 mg/dL (ref 0.44–1.00)
Calcium: 9 mg/dL (ref 8.9–10.3)
GFR calc Af Amer: >60 mL/min (ref >60)
GFR calc non Af Amer: >60 mL/min (ref >60)
GLUCOSE: 89 mg/dL (ref 65–99)
Potassium: 3.2 mmol/L — ABNORMAL LOW (ref 3.5–5.1)
SODIUM: 137 mmol/L (ref 135–145)
Total Bilirubin: 0.6 mg/dL (ref 0.3–1.2)
Total Protein: 7.5 g/dL (ref 6.5–8.1)

## 2017-12-15 LAB — URINALYSIS, ROUTINE W REFLEX MICROSCOPIC
Bilirubin Urine: NEGATIVE
Glucose, UA: NEGATIVE mg/dL
Ketones, ur: NEGATIVE mg/dL
Nitrite: NEGATIVE
PROTEIN: NEGATIVE mg/dL
Specific Gravity, Urine: 1.01 (ref 1.005–1.030)
pH: 7 (ref 5.0–8.0)

## 2017-12-15 LAB — CBC
HCT: 40.5 % (ref 36.0–46.0)
Hemoglobin: 13.1 g/dL (ref 12.0–15.0)
MCH: 26.2 pg (ref 26.0–34.0)
MCHC: 32.3 g/dL (ref 30.0–36.0)
MCV: 81 fL (ref 78.0–100.0)
PLATELETS: 260 10*3/uL (ref 150–400)
RBC: 5 MIL/uL (ref 3.87–5.11)
RDW: 13.6 % (ref 11.5–15.5)
WBC: 9.2 10*3/uL (ref 4.0–10.5)

## 2017-12-15 LAB — POC OCCULT BLOOD, ED: FECAL OCCULT BLD: POSITIVE — AB

## 2017-12-15 LAB — LIPASE, BLOOD: LIPASE: 32 U/L (ref 11–51)

## 2017-12-15 MED ORDER — CEPHALEXIN 500 MG PO CAPS
500.0000 mg | ORAL_CAPSULE | Freq: Once | ORAL | Status: AC
Start: 2017-12-15 — End: 2017-12-15
  Administered 2017-12-15: 500 mg via ORAL
  Filled 2017-12-15: qty 1

## 2017-12-15 MED ORDER — POTASSIUM CHLORIDE CRYS ER 20 MEQ PO TBCR
40.0000 meq | EXTENDED_RELEASE_TABLET | Freq: Once | ORAL | Status: AC
  Administered 2017-12-15: 40 meq via ORAL
  Filled 2017-12-15: qty 2

## 2017-12-15 NOTE — Discharge Instructions (Signed)
Take the Keflex that you prescribed at your last emergency department visit as directed.  Take Imodium as directed for diarrhea.  Avoid milk or foods containing milk such as cheese or ice cream while having diarrhea.  Call your primary care physician and ask for referral for to a gastroenterologist since you have long-standing diarrhea and small amount of blood in your stool.  You may need a colonoscopy to check for cancer.  Return if concern for any reason

## 2017-12-15 NOTE — Telephone Encounter (Signed)
Patient states that she was at the hospital and they ran some test and told her she may have cancer and needs to see a gynecologist. Patient would like a referral.

## 2017-12-15 NOTE — ED Triage Notes (Signed)
Patient c/o mid abdominal pain and intermittent x 2 weeks and getting progressively worse. Patient states the pain radiates to her back. Patient denies any N/V.

## 2017-12-15 NOTE — Telephone Encounter (Signed)
Patient notified and will follow up in 2 weeks

## 2017-12-15 NOTE — Telephone Encounter (Signed)
Referrals are not handled via phone request.   Per ED visit today documentation by provider the referral recommendation  is for a gastroenterologist.   Patient has already been referred to gastroenterology for colonoscopy last year which she declined.  She should be scheduled for follow-up visit in 2 weeks here in office from today for ED follow-up.  Godfrey PickKimberly S. Tiburcio PeaHarris, MSN, FNP-C [facility]  [address] [place], [place] [phone]

## 2017-12-15 NOTE — ED Provider Notes (Signed)
[facility]-EMERGENCY DEPT Provider Note   CSN: [id number] Arrival date & time: 12/15/17  4098     History   Chief Complaint Chief Complaint  Patient presents with  . Abdominal Pain    HPI Felicia Collier is a 62 y.o. female.  Complains of abdominal pain at epigastrium onset 2 months ago pain lasts a split second at a time not made better or worse by anything.  Other associated symptoms include "runny bowel movements" for the past 2 months and discomfort with urination for 2 months.  Pain is nonradiating she was seen here on 12/08/2017 diagnosed with cystitis prescribed Keflex which she did not fill.  Nothing makes symptoms better or worse.  No other associated symptoms. Pain-free now. HPI  Past Medical History:  Diagnosis Date  . Chronic knee pain   . Schizophrenia [facility])     Patient Active Problem List   Diagnosis Date Noted  . Pain in left wrist 07/20/2017  . Schizophrenia (HCC) 05/01/2017  . Chronic pain of left knee 02/09/2017  . Unilateral primary osteoarthritis, left knee 02/09/2017  . Neuropathy 11/22/2016  . Gastroesophageal reflux disease without esophagitis 11/22/2016  . Weight gain 11/22/2016  . Atypical chest pain 11/04/2016    Past Surgical History:  Procedure Laterality Date  . ABDOMINAL HYSTERECTOMY    . CHOLECYSTECTOMY      OB History    No data available       Home Medications    Prior to Admission medications   Medication Sig Start Date End Date Taking? Authorizing Provider  acetaminophen (TYLENOL) 500 MG tablet Take 1,000 mg by mouth every 6 (six) hours as needed for mild pain.    [provider]  cephALEXin (KEFLEX) 500 MG capsule Take 1 capsule (500 mg total) by mouth 4 (four) times daily. 12/08/17   Palumbo, April, MD  meloxicam (MOBIC) 7.5 MG tablet Take 1 tablet (7.5 mg total) by mouth daily. Patient not taking: Reported on 11/21/2017 11/17/17   Bing Neighbors, FNP  naproxen (NAPROSYN) 375 MG tablet Take 1  tablet (375 mg total) by mouth 2 (two) times daily. Patient not taking: Reported on 11/17/2017 11/12/17   Janne Napoleon, NP    Family History Family History  Problem Relation Age of Onset  . Other Mother        no health conditions per patient  . Other Father        no health conditions per patient    Social History Social History   Tobacco Use  . Smoking status: Never Smoker  . Smokeless tobacco: Never Used  Substance Use Topics  . Alcohol use: No  . Drug use: No     Allergies   Lidocaine   Review of Systems Review of Systems  Constitutional: Negative.   HENT: Negative.   Respiratory: Negative.   Cardiovascular: Negative.   Gastrointestinal: Positive for abdominal pain and diarrhea.  Genitourinary: Positive for dysuria.  Musculoskeletal: Negative.   Skin: Negative.   Neurological: Negative.   Psychiatric/Behavioral: Negative.   All other systems reviewed and are negative.    Physical Exam Updated Vital Signs BP (!) 131/95 (BP Location: Left Arm)   Pulse 79   Temp (!) 97.5 F (36.4 C) (Oral)   Resp 17   Ht 5\' 5"  (1.651 m)   Wt 74.8 kg (165 lb)   SpO2 100%   BMI 27.46 kg/m   Physical Exam  Constitutional: She is oriented to person, place, and time. She appears  well-developed and well-nourished.  HENT:  Head: Normocephalic and atraumatic.  Eyes: Conjunctivae are normal. Pupils are equal, round, and reactive to light.  Neck: Neck supple. No tracheal deviation present. No thyromegaly present.  Cardiovascular: Normal rate and regular rhythm.  No murmur heard. Pulmonary/Chest: Effort normal and breath sounds normal.  Abdominal: Soft. Bowel sounds are normal. She exhibits no distension. There is no tenderness.  Genitourinary: Rectal exam shows guaiac positive stool.  Genitourinary Comments: Rectum normal tone brown stool trace Hemoccult positive  Musculoskeletal: Normal range of motion. She exhibits no edema or tenderness.  Neurological: She is alert and  oriented to person, place, and time. Coordination normal.  Skin: Skin is warm and dry. No rash noted.  Psychiatric: She has a normal mood and affect.  Nursing note and vitals reviewed.    ED Treatments / Results  Labs (all labs ordered are listed, but only abnormal results are displayed) Labs Reviewed  LIPASE, BLOOD  COMPREHENSIVE METABOLIC PANEL  CBC  URINALYSIS, ROUTINE W REFLEX MICROSCOPIC  POC OCCULT BLOOD, ED    EKG  EKG Interpretation None      Results for orders placed or performed during the hospital encounter of 12/15/17  Lipase, blood  Result Value Ref Range   Lipase 32 11 - 51 U/L  Comprehensive metabolic panel  Result Value Ref Range   Sodium 137 135 - 145 mmol/L   Potassium 3.2 (L) 3.5 - 5.1 mmol/L   Chloride 102 101 - 111 mmol/L   CO2 29 22 - 32 mmol/L   Glucose, Bld 89 65 - 99 mg/dL   BUN 11 6 - 20 mg/dL   Creatinine, Ser 5.780.84 0.44 - 1.00 mg/dL   Calcium 9.0 8.9 - 46.910.3 mg/dL   Total Protein 7.5 6.5 - 8.1 g/dL   Albumin 3.9 3.5 - 5.0 g/dL   AST 18 15 - 41 U/L   ALT 13 (L) 14 - 54 U/L   Alkaline Phosphatase 95 38 - 126 U/L   Total Bilirubin 0.6 0.3 - 1.2 mg/dL   GFR calc non Af Amer >60 >60 mL/min   GFR calc Af Amer >60 >60 mL/min   Anion gap 6 5 - 15  CBC  Result Value Ref Range   WBC 9.2 4.0 - 10.5 K/uL   RBC 5.00 3.87 - 5.11 MIL/uL   Hemoglobin 13.1 12.0 - 15.0 g/dL   HCT 62.940.5 52.836.0 - 41.346.0 %   MCV 81.0 78.0 - 100.0 fL   MCH 26.2 26.0 - 34.0 pg   MCHC 32.3 30.0 - 36.0 g/dL   RDW 24.413.6 01.011.5 - 27.215.5 %   Platelets 260 150 - 400 K/uL  Urinalysis, Routine w reflex microscopic  Result Value Ref Range   Color, Urine STRAW (A) YELLOW   APPearance CLEAR CLEAR   Specific Gravity, Urine 1.010 1.005 - 1.030   pH 7.0 5.0 - 8.0   Glucose, UA NEGATIVE NEGATIVE mg/dL   Hgb urine dipstick SMALL (A) NEGATIVE   Bilirubin Urine NEGATIVE NEGATIVE   Ketones, ur NEGATIVE NEGATIVE mg/dL   Protein, ur NEGATIVE NEGATIVE mg/dL   Nitrite NEGATIVE NEGATIVE    Leukocytes, UA MODERATE (A) NEGATIVE   RBC / HPF 0-5 0 - 5 RBC/hpf   WBC, UA 6-30 0 - 5 WBC/hpf   Bacteria, UA RARE (A) NONE SEEN   Squamous Epithelial / LPF 0-5 (A) NONE SEEN  POC occult blood, ED  Result Value Ref Range   Fecal Occult Bld POSITIVE (A) NEGATIVE   Mm  Diag Breast Tomo Uni Left  Result Date: 11/25/2017 CLINICAL DATA:  Patient presents complaining left breast aching. No reported lumps. EXAM: 2D DIGITAL DIAGNOSTIC UNILATERAL LEFT MAMMOGRAM WITH CAD AND ADJUNCT TOMO COMPARISON:  Previous exam(s). ACR Breast Density Category b: There are scattered areas of fibroglandular density. FINDINGS: There are no discrete masses, areas of significant asymmetry, areas of architectural distortion or suspicious calcifications. Mammographic images were processed with CAD. IMPRESSION: Negative exam.  No evidence of malignancy. RECOMMENDATION: Screening mammogram in April 2019 per normal screening schedule.(Code:SM-B-01Y) I have discussed the findings and recommendations with the patient. Results were also provided in writing at the conclusion of the visit. If applicable, a reminder letter will be sent to the patient regarding the next appointment. BI-RADS CATEGORY  1: Negative. Electronically Signed   By: Amie Portland M.D.   On: 11/25/2017 10:10    Radiology No results found.  Procedures Procedures (including critical care time)  Medications Ordered in ED Medications - No data to display   Initial Impression / Assessment and Plan / ED Course  I have reviewed the triage vital signs and the nursing notes.  Pertinent labs & imaging results that were available during my care of the patient were reviewed by me and considered in my medical decision making (see chart for details).    9:40 AM patient resting comfortably.  No distress.  Plan urine sent for culture.  She reports that she has a prescription for Keflex which she can start taking tomorrow.  She was given a dose here.  I suggested that  she contact her primary care physician for referral to a gastroenterologist for colonoscopy she reports she has not had one since her 30s, and has trace Hemoccult positive stools chronic diarrhea.  Given oral potassium supplementation to discharge.  Final Clinical Impressions(s) / ED Diagnoses  Dx #1 cystitis #2 diarrhea #3  Trace Hemoccult positive stools Final diagnoses:  None  #4 hypokalemia  ED Discharge Orders    None       Doug Sou, MD 12/15/17 [phone]

## 2017-12-16 LAB — URINE CULTURE: SPECIAL REQUESTS: NORMAL

## 2017-12-23 ENCOUNTER — Ambulatory Visit (HOSPITAL_COMMUNITY)
Admission: EM | Admit: 2017-12-23 | Discharge: 2017-12-23 | Disposition: A | Discharge status: Home / Self Care | Payer: Medicaid Other | Attending: Internal Medicine | Admitting: Internal Medicine

## 2017-12-23 ENCOUNTER — Other Ambulatory Visit: Payer: Self-pay

## 2017-12-23 ENCOUNTER — Encounter (HOSPITAL_COMMUNITY): Payer: Self-pay | Admitting: Emergency Medicine

## 2017-12-23 DIAGNOSIS — R1084 Generalized abdominal pain: Secondary | ICD-10-CM

## 2017-12-23 DIAGNOSIS — R197 Diarrhea, unspecified: Secondary | ICD-10-CM | POA: Diagnosis not present

## 2017-12-23 LAB — POCT I-STAT, CHEM 8
BUN: 8 mg/dL (ref 6–20)
CREATININE: 0.8 mg/dL (ref 0.44–1.00)
Calcium, Ion: 1.17 mmol/L (ref 1.15–1.40)
Chloride: 103 mmol/L (ref 101–111)
GLUCOSE: 129 mg/dL — AB (ref 65–99)
HCT: 45 % (ref 36.0–46.0)
Hemoglobin: 15.3 g/dL — ABNORMAL HIGH (ref 12.0–15.0)
POTASSIUM: 3.3 mmol/L — AB (ref 3.5–5.1)
Sodium: 142 mmol/L (ref 135–145)
TCO2: 27 mmol/L (ref 22–32)

## 2017-12-23 MED ORDER — DICYCLOMINE HCL 20 MG PO TABS: 20.0000 mg | ORAL_TABLET | Freq: Two times a day (BID) | ORAL | 0 refills | Status: DC

## 2017-12-23 MED ORDER — POTASSIUM CHLORIDE ER 10 MEQ PO TBCR: 10.0000 meq | EXTENDED_RELEASE_TABLET | Freq: Every day | ORAL | 0 refills | Status: DC

## 2017-12-23 NOTE — ED Provider Notes (Signed)
[facility]    CSN: [id number] Arrival date & time: 12/23/17  1001     History   Chief Complaint Chief Complaint  Patient presents with  . Abdominal Pain    HPI Felicia Collier is a 62 y.o. female.   62 year old female, with history of schizophrenia, presenting today complaining of abdominal pain with diarrhea over the past 2-3 months.  Patient was seen and evaluated in the emergency department on 1/31 for the same.  Was noted to be Hemoccult positive at that time.  Was also found to be mildly hypokalemic.  Was given potassium supplementation and referred to outpatient GI.  Patient states that she has not been able to see her primary care doctor to get a GI referral. Denies any fever, chills, nausea or vomiting    The history is provided by the patient.  Abdominal Pain  Pain location:  Epigastric and RUQ Pain quality: aching   Pain radiates to:  Does not radiate Pain severity:  Mild Onset quality:  Gradual Duration:  8 weeks Timing:  Intermittent Progression:  Unchanged Chronicity:  Recurrent Context: eating   Context: not alcohol use, not awakening from sleep, not diet changes and not laxative use   Relieved by:  Nothing Worsened by:  Nothing Ineffective treatments:  None tried Associated symptoms: diarrhea   Associated symptoms: no anorexia, no belching, no chest pain, no chills, no constipation, no cough, no dysuria, no fatigue, no fever, no flatus, no hematemesis, no hematochezia, no hematuria, no shortness of breath, no sore throat, no vaginal bleeding, no vaginal discharge and no vomiting   Risk factors: no alcohol abuse, no aspirin use and not elderly     Past Medical History:  Diagnosis Date  . Chronic knee pain   . Schizophrenia [facility](HCC)     Patient Active Problem List   Diagnosis Date Noted  . Pain in left wrist 07/20/2017  . Schizophrenia (HCC) 05/01/2017  . Chronic pain of left knee 02/09/2017  . Unilateral primary osteoarthritis, left knee  02/09/2017  . Neuropathy 11/22/2016  . Gastroesophageal reflux disease without esophagitis 11/22/2016  . Weight gain 11/22/2016  . Atypical chest pain 11/04/2016    Past Surgical History:  Procedure Laterality Date  . ABDOMINAL HYSTERECTOMY    . CHOLECYSTECTOMY      OB History    No data available       Home Medications    Prior to Admission medications   Medication Sig Start Date End Date Taking? Authorizing Provider  dicyclomine (BENTYL) 20 MG tablet Take 1 tablet (20 mg total) by mouth 2 (two) times daily. 12/23/17   Briel Gallicchio C, PA-C  potassium chloride (K-DUR) 10 MEQ tablet Take 1 tablet (10 mEq total) by mouth daily for 5 days. 12/23/17 12/28/17  Alecia LemmingBlue, Jaimin Krupka C, PA-C    Family History Family History  Problem Relation Age of Onset  . Other Mother        no health conditions per patient  . Other Father        no health conditions per patient    Social History Social History   Tobacco Use  . Smoking status: Never Smoker  . Smokeless tobacco: Never Used  Substance Use Topics  . Alcohol use: No  . Drug use: No     Allergies   Lidocaine   Review of Systems Review of Systems  Constitutional: Negative for chills, fatigue and fever.  HENT: Negative for ear pain and sore throat.   Eyes: Negative for  pain and visual disturbance.  Respiratory: Negative for cough and shortness of breath.   Cardiovascular: Negative for chest pain and palpitations.  Gastrointestinal: Positive for abdominal pain and diarrhea. Negative for anorexia, constipation, flatus, hematemesis, hematochezia and vomiting.  Genitourinary: Negative for dysuria, hematuria, vaginal bleeding and vaginal discharge.  Musculoskeletal: Negative for arthralgias and back pain.  Skin: Negative for color change and rash.  Neurological: Negative for seizures and syncope.  All other systems reviewed and are negative.    Physical Exam Triage Vital Signs ED Triage Vitals  Enc Vitals Group     BP  12/23/17 1016 124/78     Pulse Rate 12/23/17 1016 85     Resp 12/23/17 1016 16     Temp 12/23/17 1016 97.9 F (36.6 C)     Temp Source 12/23/17 1016 Oral     SpO2 12/23/17 1016 98 %     Weight --      Height --      Head Circumference --      Peak Flow --      Pain Score 12/23/17 1013 10     Pain Loc --      Pain Edu? --      Excl. in GC? --    No data found.  Updated Vital Signs BP 124/78 (BP Location: Left Arm)   Pulse 85   Temp 97.9 F (36.6 C) (Oral)   Resp 16   SpO2 98%   Visual Acuity Right Eye Distance:   Left Eye Distance:   Bilateral Distance:    Right Eye Near:   Left Eye Near:    Bilateral Near:     Physical Exam  Constitutional: She appears well-developed and well-nourished. No distress.  HENT:  Head: Normocephalic and atraumatic.  Eyes: Conjunctivae are normal.  Neck: Neck supple.  Cardiovascular: Normal rate and regular rhythm.  No murmur heard. Pulmonary/Chest: Effort normal and breath sounds normal. No respiratory distress.  Abdominal: Soft. There is no tenderness.  No tenderness   Musculoskeletal: She exhibits no edema.  Neurological: She is alert.  Skin: Skin is warm and dry.  Psychiatric: She has a normal mood and affect.  Nursing note and vitals reviewed.    UC Treatments / Results  Labs (all labs ordered are listed, but only abnormal results are displayed) Labs Reviewed  POCT I-STAT, CHEM 8 - Abnormal; Notable for the following components:      Result Value   Potassium 3.3 (*)    Glucose, Bld 129 (*)    Hemoglobin 15.3 (*)    All other components within normal limits    EKG  EKG Interpretation None       Radiology No results found.  Procedures Procedures (including critical care time)  Medications Ordered in UC Medications - No data to display   Initial Impression / Assessment and Plan / UC Course  I have reviewed the triage vital signs and the nursing notes.  Pertinent labs & imaging results that were  available during my care of the patient were reviewed by me and considered in my medical decision making (see chart for details).     Chem-8 done today shows mild hypokalemia with a potassium of 3.3.  Will treat with 5 days of potassium supplementation.  She was also given outpatient GI referral. She has no abdominal pain on exam.  This appears to be an ongoing chronic issue for the patient.  Final Clinical Impressions(s) / UC Diagnoses   Final diagnoses:  Generalized  abdominal pain  Diarrhea, unspecified type    ED Discharge Orders        Ordered    dicyclomine (BENTYL) 20 MG tablet  2 times daily     12/23/17 1052    potassium chloride (K-DUR) 10 MEQ tablet  Daily     12/23/17 1052       Controlled Substance Prescriptions [place] Controlled Substance Registry consulted? Not Applicable   Alecia Lemming, [place] 12/23/17 1057

## 2017-12-23 NOTE — ED Triage Notes (Signed)
[facility] a [facility] of abdominal pain and diarrhea x 2 weeks. The patient stated that she was also treated in the ED for the same complaint and referred to a specialist that she has not seen.

## 2017-12-31 ENCOUNTER — Encounter (HOSPITAL_COMMUNITY): Payer: Self-pay | Admitting: Emergency Medicine

## 2017-12-31 ENCOUNTER — Ambulatory Visit (HOSPITAL_COMMUNITY)
Admission: EM | Admit: 2017-12-31 | Discharge: 2017-12-31 | Disposition: A | Discharge status: Home / Self Care | Payer: Medicaid Other | Attending: Family Medicine | Admitting: Family Medicine

## 2017-12-31 DIAGNOSIS — N3090 Cystitis, unspecified without hematuria: Secondary | ICD-10-CM | POA: Diagnosis not present

## 2017-12-31 LAB — POCT URINALYSIS DIP (DEVICE)
Bilirubin Urine: NEGATIVE
Glucose, UA: NEGATIVE mg/dL
Hgb urine dipstick: NEGATIVE
Ketones, ur: NEGATIVE mg/dL
NITRITE: NEGATIVE
PH: 5.5 (ref 5.0–8.0)
Protein, ur: NEGATIVE mg/dL
SPECIFIC GRAVITY, URINE: 1.015 (ref 1.005–1.030)
UROBILINOGEN UA: 0.2 mg/dL (ref 0.0–1.0)

## 2017-12-31 MED ORDER — CEPHALEXIN 500 MG PO CAPS: 500.0000 mg | ORAL_CAPSULE | Freq: Two times a day (BID) | ORAL | 0 refills | Status: DC

## 2017-12-31 NOTE — ED Triage Notes (Signed)
PT C/O: pain around the groin area and when she voids urine   Also reports abd pain  Reports she is not sexually active   DENIES: vag d/c  TAKING MEDS: none   A&O x4... NAD... Ambulatory

## 2017-12-31 NOTE — ED Provider Notes (Signed)
  [facility]    ASSESSMENT & PLAN:  1. Cystitis     Meds ordered this encounter  Medications  . cephALEXin (KEFLEX) 500 MG capsule    Sig: Take 1 capsule (500 mg total) by mouth 2 (two) times daily.    Dispense:  10 capsule    Refill:  0   Urine culture sent. Will notify patient when results available. Will follow up with her PCP or here if not showing improvement over the next 48 hours, sooner if needed.  Outlined signs and symptoms indicating need for more acute intervention. Patient verbalized understanding. After Visit Summary given.  SUBJECTIVE:  Felicia Collier is a 62 y.o. female who complains of urinary frequency, urgency and dysuria for the past few days. No flank pain, fever, chills, abnormal vaginal discharge or bleeding. Hematuria: not present. Normal PO intake. No abdominal pain. No self treatment. Ambulatory without difficulty.  LMP: No LMP recorded. Patient has had a hysterectomy.  ROS: As in HPI.  OBJECTIVE:  Vitals:   12/31/17 1220  BP: (!) 148/71  Pulse: (!) 57  Resp: 20  Temp: 98.2 F (36.8 C)  TempSrc: Oral  SpO2: 100%   Appears well, in no apparent distress. Abdomen is soft without tenderness, guarding, mass, rebound or organomegaly. No CVA tenderness or inguinal adenopathy noted.  Labs Reviewed  POCT URINALYSIS DIP (DEVICE) - Abnormal; Notable for the following components:      Result Value   Leukocytes, UA SMALL (*)    All other components within normal limits  URINE CULTURE    Allergies  Allergen Reactions  . Lidocaine     Face swelling     Past Medical History:  Diagnosis Date  . Chronic knee pain   . Schizophrenia [facility](HCC)    Social History   Socioeconomic History  . Marital status: Single    Spouse name: Not on file  . Number of children: Not on file  . Years of education: Not on file  . Highest education level: Not on file  Social Needs  . Financial resource strain: Not on file  . Food insecurity - worry: Not  on file  . Food insecurity - inability: Not on file  . Transportation needs - medical: Not on file  . Transportation needs - non-medical: Not on file  Occupational History  . Not on file  Tobacco Use  . Smoking status: Never Smoker  . Smokeless tobacco: Never Used  Substance and Sexual Activity  . Alcohol use: No  . Drug use: No  . Sexual activity: Not Currently  Other Topics Concern  . Not on file  Social History Narrative  . Not on file   Family History  Problem Relation Age of Onset  . Other Mother        no health conditions per patient  . Other Father        no health conditions per patient       Mardella LaymanHagler, Marlin Jarrard, MD 12/31/17 1236

## 2018-01-01 LAB — URINE CULTURE: Culture: NO GROWTH

## 2018-01-02 ENCOUNTER — Encounter: Payer: Self-pay | Admitting: Family Medicine

## 2018-01-02 ENCOUNTER — Ambulatory Visit: Payer: Medicaid Other | Admitting: Family Medicine

## 2018-01-02 VITALS — BP 116/64 | HR 60 | Temp 98.0°F | Resp 16 | Ht 65.0 in | Wt 163.2 lb

## 2018-01-02 DIAGNOSIS — R82998 Other abnormal findings in urine: Secondary | ICD-10-CM | POA: Diagnosis not present

## 2018-01-02 DIAGNOSIS — N3020 Other chronic cystitis without hematuria: Secondary | ICD-10-CM | POA: Diagnosis not present

## 2018-01-02 DIAGNOSIS — R1013 Epigastric pain: Secondary | ICD-10-CM

## 2018-01-02 LAB — POCT URINALYSIS DIP (DEVICE)
BILIRUBIN URINE: NEGATIVE
Glucose, UA: NEGATIVE mg/dL
Ketones, ur: NEGATIVE mg/dL
Nitrite: NEGATIVE
Protein, ur: NEGATIVE mg/dL
Specific Gravity, Urine: 1.025 (ref 1.005–1.030)
UROBILINOGEN UA: 0.2 mg/dL (ref 0.0–1.0)
pH: 5.5 (ref 5.0–8.0)

## 2018-01-02 MED ORDER — RANITIDINE HCL 150 MG PO TABS: 150.0000 mg | ORAL_TABLET | Freq: Two times a day (BID) | ORAL | 0 refills | Status: DC

## 2018-01-02 NOTE — Progress Notes (Signed)
Patient ID: Felicia Collier, female    DOB: February 25, 1956, 62 y.o.   MRN: [id number]  PCP: Bing NeighborsHarris, Cliford Sequeira S, FNP  Chief Complaint  Patient presents with  . Follow-up    Abdominal pain    Subjective:  HPI Felicia Collier is a 62 y.o. female with schizophrenia, chronic cystitis, chronic abdominal pain, presents for evaluation of ongoing dysuria and abdominal burning.  She has had multiple visits to the ED over the course of the last 90 days with complaints of cystitis and generalized abdominal pain.  She has been treated repetitively with antibiotic therapy for the presence of leukocytes in urine.  Urine cultures have been negative of any specific bacteria.  Of recent she presented to [facility] care on 12/31/2017 and was prescribed Keflex for which she has not started.  She was last treated with antibiotic therapy for UTI on 12/15/2017 which she reports that she did complete and symptoms did improve.  She denies any chills, nausea, vomiting, low back pain.  She also complains today of abdominal burning which is worsened by intake of food.  She suffers from IBS although denies any recent constipation or diarrhea symptoms.  She has not attempted any relief with the PPI and has no prior diagnosis of acid reflux. Social History   Socioeconomic History  . Marital status: Single    Spouse name: Not on file  . Number of children: Not on file  . Years of education: Not on file  . Highest education level: Not on file  Social Needs  . Financial resource strain: Not on file  . Food insecurity - worry: Not on file  . Food insecurity - inability: Not on file  . Transportation needs - medical: Not on file  . Transportation needs - non-medical: Not on file  Occupational History  . Not on file  Tobacco Use  . Smoking status: Never Smoker  . Smokeless tobacco: Never Used  Substance and Sexual Activity  . Alcohol use: No  . Drug use: No  . Sexual activity: Not Currently  Other Topics Concern  . Not  on file  Social History Narrative  . Not on file    Family History  Problem Relation Age of Onset  . Other Mother        no health conditions per patient  . Other Father        no health conditions per patient    Review of Systems  Patient Active Problem List   Diagnosis Date Noted  . Pain in left wrist 07/20/2017  . Schizophrenia (HCC) 05/01/2017  . Chronic pain of left knee 02/09/2017  . Unilateral primary osteoarthritis, left knee 02/09/2017  . Neuropathy 11/22/2016  . Gastroesophageal reflux disease without esophagitis 11/22/2016  . Weight gain 11/22/2016  . Atypical chest pain 11/04/2016    Allergies  Allergen Reactions  . Lidocaine     Face swelling     Prior to Admission medications   Medication Sig Start Date End Date Taking? Authorizing Provider  cephALEXin (KEFLEX) 500 MG capsule Take 1 capsule (500 mg total) by mouth 2 (two) times daily. Patient not taking: Reported on 01/02/2018 12/31/17   Mardella LaymanHagler, Brian, MD  dicyclomine (BENTYL) 20 MG tablet Take 1 tablet (20 mg total) by mouth 2 (two) times daily. Patient not taking: Reported on 01/02/2018 12/23/17   Alysia PennaBlue, Olivia C, PA-C  potassium chloride (K-DUR) 10 MEQ tablet Take 1 tablet (10 mEq total) by mouth daily for 5 days. 12/23/17 12/28/17  Alecia Lemming, PA-C    Past Medical, Surgical Family and Social History reviewed and updated.    Objective:   Today's Vitals   01/02/18 0802  BP: 116/64  Pulse: 60  Resp: 16  Temp: 98 F (36.7 C)  TempSrc: Oral  SpO2: 97%  Weight: 163 lb 3.2 oz (74 kg)  Height: 5\' 5"  (1.651 m)    Wt Readings from Last 3 Encounters:  01/02/18 163 lb 3.2 oz (74 kg)  12/15/17 165 lb (74.8 kg)  12/08/17 165 lb (74.8 kg)   Physical Exam Constitutional: She is oriented to person, place, and time. She appears well-developed and well-nourished.  HENT:  Head: Normocephalic and atraumatic.  Eyes: Conjunctivae and EOM are normal. Pupils are equal, round, and reactive to light.  Neck:  Normal range of motion. Neck supple.  Cardiovascular: Normal rate, regular rhythm, normal heart sounds and intact distal pulses.  Pulmonary/Chest: Effort normal and breath sounds normal.  Abdominal: Soft. Bowel sounds are normal. She exhibits no distension and no mass. There is no tenderness. There is no rebound and no guarding.  Musculoskeletal: Normal range of motion.  Neurological: She is alert and oriented to person, place, and time.  Psychiatric: She has a normal mood and affect. Her behavior is normal. Judgment and thought content normal.   Assessment & Plan:  1. Chronic cystitis, Ambulatory referral to Urology  2. Urine leukocytes, Urine Culture  3. Epigastric pain, will trial ranitidine 150 mg once daily for symptoms consistent with acid reflux. Patient advised to take medication consistently with meals twice daily.   Meds ordered this encounter  Medications  . ranitidine (ZANTAC) 150 MG tablet    Sig: Take 1 tablet (150 mg total) by mouth 2 (two) times daily.    Dispense:  60 tablet    Refill:  0    Order Specific Question:   Supervising Provider    Answer:   Quentin Angst [id number]    Orders Placed This Encounter  Procedures  . Urine Culture  . Ambulatory referral to Urology  . POCT urinalysis dip (device)    RTC: 6 weeks for chronic condition follow-up.   Godfrey Pick. Tiburcio Pea, MSN, FNP-C [facility]  [address] [place], [place] [address] [phone]

## 2018-01-04 ENCOUNTER — Other Ambulatory Visit: Payer: Self-pay

## 2018-01-04 LAB — URINE CULTURE

## 2018-01-05 ENCOUNTER — Telehealth: Payer: Self-pay

## 2018-01-05 NOTE — Telephone Encounter (Signed)
Ms. Lurena NidaSteriblin is trying to get patient to get back at [facility] for mental health services. Patient reported that she has colon caner and something regarding a water bag. Ms. Lurena NidaSteriblin will attend patient appointment on 01/18/2018 so they can get on the same page regarding her care.

## 2018-01-10 ENCOUNTER — Other Ambulatory Visit: Payer: Self-pay

## 2018-01-10 ENCOUNTER — Encounter (HOSPITAL_COMMUNITY): Payer: Self-pay | Admitting: Emergency Medicine

## 2018-01-10 ENCOUNTER — Emergency Department (HOSPITAL_COMMUNITY)
Admission: EM | Admit: 2018-01-10 | Discharge: 2018-01-10 | Disposition: A | Discharge status: Home / Self Care | Payer: Medicaid Other | Attending: Emergency Medicine | Admitting: Emergency Medicine

## 2018-01-10 DIAGNOSIS — G5602 Carpal tunnel syndrome, left upper limb: Secondary | ICD-10-CM | POA: Diagnosis not present

## 2018-01-10 DIAGNOSIS — M25532 Pain in left wrist: Secondary | ICD-10-CM | POA: Diagnosis present

## 2018-01-10 MED ORDER — NAPROXEN 375 MG PO TABS: 375.0000 mg | ORAL_TABLET | Freq: Two times a day (BID) | ORAL | 0 refills | Status: DC

## 2018-01-10 NOTE — ED Notes (Signed)
Bed: WTR7 Expected date:  Expected time:  Means of arrival:  Comments: 

## 2018-01-10 NOTE — ED Triage Notes (Signed)
Pt reports left wrist pain after having surgery for carpel tunnel. Pt reports that the orthopedic doctor did not cure it.

## 2018-01-10 NOTE — Discharge Instructions (Signed)
Please see Dr. Amanda PeaGramig, Hand Surgeon for further evaluation. Keep the brace on all night.

## 2018-01-11 NOTE — ED Provider Notes (Signed)
[facility]-EMERGENCY DEPT Provider Note   CSN: [id number] Arrival date & time: 01/10/18  0410     History   Chief Complaint Chief Complaint  Patient presents with  . Wrist Pain    HPI Felicia Collier is a 62 y.o. female.  HPI  62 year old female with history of chronic knee pain and schizophrenia comes in with chief complaint of left wrist pain.  Patient states that she was diagnosed with carpal tunnel, and previous orthopedic therapy has not helped.  Patient continues to have pain in her wrist, which is worse at nighttime.  Patient is unclear as to whether she is having any tingling or not.  No history of recent falls, rheumatoid arthritis, or history of gout.  Past Medical History:  Diagnosis Date  . Chronic knee pain   . Schizophrenia [facility])     Patient Active Problem List   Diagnosis Date Noted  . Pain in left wrist 07/20/2017  . Schizophrenia (HCC) 05/01/2017  . Chronic pain of left knee 02/09/2017  . Unilateral primary osteoarthritis, left knee 02/09/2017  . Neuropathy 11/22/2016  . Gastroesophageal reflux disease without esophagitis 11/22/2016  . Weight gain 11/22/2016  . Atypical chest pain 11/04/2016    Past Surgical History:  Procedure Laterality Date  . ABDOMINAL HYSTERECTOMY    . CHOLECYSTECTOMY      OB History    No data available       Home Medications    Prior to Admission medications   Medication Sig Start Date End Date Taking? Authorizing Provider  cephALEXin (KEFLEX) 500 MG capsule Take 1 capsule (500 mg total) by mouth 2 (two) times daily. Patient not taking: Reported on 01/02/2018 12/31/17   Mardella Layman, MD  dicyclomine (BENTYL) 20 MG tablet Take 1 tablet (20 mg total) by mouth 2 (two) times daily. Patient not taking: Reported on 01/02/2018 12/23/17   Blue, Zollie Scale C, PA-C  naproxen (NAPROSYN) 375 MG tablet Take 1 tablet (375 mg total) by mouth 2 (two) times daily. 01/10/18   Derwood Kaplan, MD  potassium chloride (K-DUR)  10 MEQ tablet Take 1 tablet (10 mEq total) by mouth daily for 5 days. 12/23/17 12/28/17  Blue, Olivia C, PA-C  ranitidine (ZANTAC) 150 MG tablet Take 1 tablet (150 mg total) by mouth 2 (two) times daily. 01/02/18   Bing Neighbors, FNP    Family History Family History  Problem Relation Age of Onset  . Other Mother        no health conditions per patient  . Other Father        no health conditions per patient    Social History Social History   Tobacco Use  . Smoking status: Never Smoker  . Smokeless tobacco: Never Used  Substance Use Topics  . Alcohol use: No  . Drug use: No     Allergies   Lidocaine   Review of Systems Review of Systems  Constitutional: Positive for activity change.  Musculoskeletal: Positive for arthralgias.  Skin: Negative for rash.  Allergic/Immunologic: Negative for immunocompromised state.     Physical Exam Updated Vital Signs BP 128/74   Pulse 77   Temp 98.2 F (36.8 C) (Oral)   Resp 16   Ht 5\' 5"  (1.651 m)   Wt 73.9 kg (163 lb)   SpO2 99%   BMI 27.12 kg/m   Physical Exam  Constitutional: She is oriented to person, place, and time. She appears well-developed.  HENT:  Head: Normocephalic and atraumatic.  Eyes: EOM  are normal.  Neck: Normal range of motion. Neck supple.  Cardiovascular: Normal rate.  Pulmonary/Chest: Effort normal.  Abdominal: Bowel sounds are normal.  Musculoskeletal:  Range of motion of the left wrist is normal.  Patient has + Phalen's.  Neurological: She is alert and oriented to person, place, and time.  Skin: Skin is warm and dry.  Nursing note and vitals reviewed.    ED Treatments / Results  Labs (all labs ordered are listed, but only abnormal results are displayed) Labs Reviewed - No data to display  EKG  EKG Interpretation None       Radiology No results found.  Procedures Procedures (including critical care time)  Medications Ordered in ED Medications - No data to display   Initial  Impression / Assessment and Plan / ED Course  I have reviewed the triage vital signs and the nursing notes.  Pertinent labs & imaging results that were available during my care of the patient were reviewed by me and considered in my medical decision making (see chart for details).     62 year old female with history of carpal tunnels comes in with chief complaint of wrist pain.  Patient is not using a brace at night.  She reports that she had a carpal tunnel release in the past which did not help.  Patient does have positive Phalen's on my exam.  Medically there is no suspicion for septic arthritis.  Exam not consistent with rheumatoid arthritis either.  We will have patient continue to follow-up with hand surgery.  In the interim we will give her a wrist brace, and advised her to use it at nighttime.  Final Clinical Impressions(s) / ED Diagnoses   Final diagnoses:  Carpal tunnel syndrome of left wrist  Acute pain of left wrist    ED Discharge Orders        Ordered    naproxen (NAPROSYN) 375 MG tablet  2 times daily     01/10/18 0756       Derwood KaplanNanavati, Aleeya Veitch, MD 01/11/18 [phone]

## 2018-01-18 ENCOUNTER — Ambulatory Visit (INDEPENDENT_AMBULATORY_CARE_PROVIDER_SITE_OTHER): Payer: Medicaid Other | Admitting: Family Medicine

## 2018-01-18 ENCOUNTER — Encounter: Payer: Self-pay | Admitting: Family Medicine

## 2018-01-18 VITALS — BP 130/82 | HR 74 | Temp 97.9°F | Resp 16 | Ht 65.0 in | Wt 162.0 lb

## 2018-01-18 DIAGNOSIS — G5602 Carpal tunnel syndrome, left upper limb: Secondary | ICD-10-CM | POA: Diagnosis not present

## 2018-01-18 DIAGNOSIS — F203 Undifferentiated schizophrenia: Secondary | ICD-10-CM

## 2018-01-18 MED ORDER — ARIPIPRAZOLE 5 MG PO TABS: 5.0000 mg | ORAL_TABLET | Freq: Every day | ORAL | 1 refills | Status: DC

## 2018-01-18 MED ORDER — QUETIAPINE FUMARATE ER 200 MG PO TB24: 200.0000 mg | ORAL_TABLET | Freq: Every day | ORAL | 0 refills | Status: DC

## 2018-01-18 NOTE — Progress Notes (Signed)
Patient ID: Felicia Collier, female    DOB: [birth year], 62 y.o.   MRN: [id number]  PCP: Bing Neighbors, FNP  Chief Complaint  Patient presents with  . Follow-up    6 weeks routine care    Subjective:  HPI-Social Worker present at visit today -Virgel Paling Felicia Collier is a 62 y.o. female presents for evaluations of chronic abdominal pain. Felicia Collier is slightly agitated during today's visit. She reports t that she underwent a surgical procedure on yesterday and later recanted and told me that it was asked for a joint injection of her right wrist. She was seen by Marijean Bravo, MD at [facility] yesterday and received a joint injection to the right wrist to improve symptoms of carpal tunnel. She reports pain is manageable. She was provided a topical antiinflammatory consisting of ibuprofen, baclofen , lidocaine, and GABA. She only needs to return to see Dr. Amanda Pea as needed or if symptoms worsen. Continue to wear brace as needed. Denies numbness, tingling, or weakness. Upon arrive of social worker to today's visit, Felicia Collier began discussing various dates of when she was scheduled at [facility]. According to Felicia Collier her last visit at [facility] was 11/16/2017 in which she refused medication. She reports if she goes to [facility] more than 3 times in 1 month on the 3rd visit "they'll kill you". She is not interested in changing mental health providers. She states that her "schizophrenia is over" " I got over that while in [facility]". She describes repetitively Felicia Collier is checking her for a  "clamp inside of my chest" and a "trying to burst the water bag" and " momma sent my children to foster care".   Social History   Socioeconomic History  . Marital status: Single    Spouse name: Not on file  . Number of children: Not on file  . Years of education: Not on file  . Highest education level: Not on file  Social Needs  . Financial resource strain: Not on file  . Food insecurity - worry: Not  on file  . Food insecurity - inability: Not on file  . Transportation needs - medical: Not on file  . Transportation needs - non-medical: Not on file  Occupational History  . Not on file  Tobacco Use  . Smoking status: Never Smoker  . Smokeless tobacco: Never Used  Substance and Sexual Activity  . Alcohol use: No  . Drug use: No  . Sexual activity: Not Currently  Other Topics Concern  . Not on file  Social History Narrative  . Not on file   Family History  Problem Relation Age of Onset  . Other Mother        no health conditions per patient  . Other Father        no health conditions per patient   Review of Systems  Patient Active Problem List   Diagnosis Date Noted  . Pain in left wrist 07/20/2017  . Schizophrenia (HCC) 05/01/2017  . Chronic pain of left knee 02/09/2017  . Unilateral primary osteoarthritis, left knee 02/09/2017  . Neuropathy 11/22/2016  . Gastroesophageal reflux disease without esophagitis 11/22/2016  . Weight gain 11/22/2016  . Atypical chest pain 11/04/2016    Allergies  Allergen Reactions  . Lidocaine     Face swelling     Prior to Admission medications   Medication Sig Start Date End Date Taking? Authorizing Provider  cephALEXin (KEFLEX) 500 MG capsule Take 1 capsule (500 mg total) by mouth 2 (  two) times daily. Patient not taking: Reported on 01/02/2018 12/31/17   Mardella Layman, MD  dicyclomine (BENTYL) 20 MG tablet Take 1 tablet (20 mg total) by mouth 2 (two) times daily. Patient not taking: Reported on 01/02/2018 12/23/17   Blue, Zollie Scale C, PA-C  naproxen (NAPROSYN) 375 MG tablet Take 1 tablet (375 mg total) by mouth 2 (two) times daily. Patient not taking: Reported on 01/18/2018 01/10/18   Derwood Kaplan, MD  potassium chloride (K-DUR) 10 MEQ tablet Take 1 tablet (10 mEq total) by mouth daily for 5 days. 12/23/17 12/28/17  Blue, Olivia C, PA-C  ranitidine (ZANTAC) 150 MG tablet Take 1 tablet (150 mg total) by mouth 2 (two) times daily. Patient not  taking: Reported on 01/18/2018 01/02/18   Bing Neighbors, FNP    Past Medical, Surgical Family and Social History reviewed and updated.    Objective:   Today's Vitals   01/18/18 0807  BP: 130/82  Pulse: 74  Resp: 16  Temp: 97.9 F (36.6 C)  TempSrc: Oral  SpO2: 97%  Weight: 162 lb (73.5 kg)  Height: 5\' 5"  (1.651 m)    Wt Readings from Last 3 Encounters:  01/18/18 162 lb (73.5 kg)  01/10/18 163 lb (73.9 kg)  01/02/18 163 lb 3.2 oz (74 kg)    Physical Exam  Constitutional: She appears well-developed.  Cardiovascular: Normal rate, regular rhythm, normal heart sounds and intact distal pulses.  Pulmonary/Chest: Effort normal and breath sounds normal.  Abdominal: Soft. Bowel sounds are normal.  Musculoskeletal:  Lateral left wrist tenderness at injection site Full ROM of wrist. Negative of crepitus.   Psychiatric: Her affect is labile. Her speech is rapid and/or pressured. She is agitated and hyperactive. Thought content is paranoid. Thought content is not delusional. Cognition and memory are impaired. She expresses impulsivity and inappropriate judgment. She expresses no homicidal and no suicidal ideation. She expresses no suicidal plans and no homicidal plans.  "flight of ideas"    Assessment & Plan:  1. Carpal tunnel syndrome of left wrist, stable, no evidence of tenderness or limited ROM.  Continue to utilize topical analgesic as needed.  2. Undifferentiated schizophrenia (HCC), patient is a exhibiting today rapid thoughts, rapid pressured speaking, she is agitated and also hyperactive, she is having repetitive flights of ideas and she is convinced of foreign objects are inside of her body.  At present she is currently not on any psycho active medications.  I am going to trial her on Abilify 5 mg once daily and Seroquel 200 XR mg at bedtime in efforts to settle irrational thoughts and impulsivity.  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  Social workers states that she will lives with her son and she will reach out to him and has been actively working to get Felicia Collier back into [facility] however the earliest available appointment will be 02/07/2018.  Felicia Collier has repetitively declined Haldol injections and I am uncertain if she will consent to having one on the 02/07/2018 therefore I am trialing these medications in efforts to control her symptoms.    Meds ordered this encounter  Medications  . QUEtiapine (SEROQUEL XR) 200 MG 24 hr tablet    Sig: Take 1 tablet (200 mg total) by mouth at bedtime.    Dispense:  90 tablet    Refill:  0    Order Specific Question:   Supervising Provider    Answer:  JEGEDE, OLUGBEMIGA E [id number]  . ARIPiprazole (ABILIFY) 5 MG tablet    Sig: Take 1 tablet (5 mg total) by mouth daily.    Dispense:  60 tablet    Refill:  1    Order Specific Question:   Supervising Provider    Answer:   Quentin AngstJEGEDE, OLUGBEMIGA E [[id number]]    Return for care in 1 week for evaluation of current schizoaffective symptoms.   A total of 30 minutes spent, greater than 50 % of this time was spent reviewing prior medical history, reviewing medications and indications of treatment, prior labs and diagnostic tests, discussing current plan of treatment, health promotion, and goals of treatment.     Godfrey PickKimberly S. Tiburcio PeaHarris, MSN, FNP-C [facility]  [address] [place], [place] [address] [phone]

## 2018-01-30 ENCOUNTER — Emergency Department (HOSPITAL_COMMUNITY)
Admission: EM | Admit: 2018-01-30 | Discharge: 2018-01-30 | Disposition: A | Discharge status: Home / Self Care | Payer: Medicaid Other | Attending: Emergency Medicine | Admitting: Emergency Medicine

## 2018-01-30 ENCOUNTER — Other Ambulatory Visit: Payer: Self-pay

## 2018-01-30 ENCOUNTER — Encounter: Payer: Self-pay | Admitting: Family Medicine

## 2018-01-30 ENCOUNTER — Encounter (HOSPITAL_COMMUNITY): Payer: Self-pay | Admitting: Emergency Medicine

## 2018-01-30 ENCOUNTER — Ambulatory Visit (INDEPENDENT_AMBULATORY_CARE_PROVIDER_SITE_OTHER): Payer: Medicaid Other | Admitting: Family Medicine

## 2018-01-30 VITALS — BP 123/54 | HR 76 | Temp 97.8°F | Ht 65.0 in | Wt 160.0 lb

## 2018-01-30 DIAGNOSIS — M549 Dorsalgia, unspecified: Secondary | ICD-10-CM | POA: Insufficient documentation

## 2018-01-30 DIAGNOSIS — J069 Acute upper respiratory infection, unspecified: Secondary | ICD-10-CM

## 2018-01-30 DIAGNOSIS — Z5321 Procedure and treatment not carried out due to patient leaving prior to being seen by health care provider: Secondary | ICD-10-CM | POA: Insufficient documentation

## 2018-01-30 DIAGNOSIS — R070 Pain in throat: Secondary | ICD-10-CM | POA: Insufficient documentation

## 2018-01-30 DIAGNOSIS — F203 Undifferentiated schizophrenia: Secondary | ICD-10-CM

## 2018-01-30 HISTORY — DX: Dorsalgia, unspecified: M54.9

## 2018-01-30 HISTORY — DX: Other chronic pain: G89.29

## 2018-01-30 LAB — RAPID STREP SCREEN (MED CTR MEBANE ONLY): STREPTOCOCCUS, GROUP A SCREEN (DIRECT): NEGATIVE

## 2018-01-30 MED ORDER — AZITHROMYCIN 250 MG PO TABS: ORAL_TABLET | ORAL | 0 refills | Status: DC

## 2018-01-30 NOTE — ED Notes (Signed)
Per provider, patient not in room went to assess.

## 2018-01-30 NOTE — ED Provider Notes (Cosign Needed)
Went in to room to evaluate Felicia Collier @ 8:30 pm and no one in exam room.    Felicia Collier, Felicia Collier, [place] 01/30/18 2036

## 2018-01-30 NOTE — Progress Notes (Signed)
Patient ID: Felicia Collier, female    DOB: Apr 08, 1956, 62 y.o.   MRN: [id number]  PCP: Bing NeighborsHarris, Akif Weldy S, FNP  Chief Complaint  Patient presents with  . Follow-up    Subjective:  HPI Felicia Collier is a 62 y.o. female with schizophrenia, presents for two week follow-up evaluation for medication management. Felicia Collier was last seen in office on 01/18/2018 accompanied by Child psychotherapistsocial worker. During that visit, Felicia Collier was expressive of "flights of ideas", overall behavior was inappropriate and was slightly manic. She is followed by Dr. Merlyn [facility]Fred at [facility] however, her next office visit is not scheduled until the end of arch. Felicia Collier has previously been prescribed Haldol injection-although of recent she has declined injection. During her last office visit, Felicia Collier was prescribed Abilify and Seroquel, however, today reports  "I took the medication until I can't take it no more". "It made her bleed". She reports throwing medication away in the garbage. Reports medication caused her to feel achy and tired. Felicia Collier has a history of medication non-compliance. Reports that the bleeding has resolved. She complains about headache, cough, sore throat, and congestion. Reports symptoms present over the last 3 weeks. She has not taken any medication. Denies fever, shortness of breath, chills , or productive cough.  Social History   Socioeconomic History  . Marital status: Single    Spouse name: Not on file  . Number of children: Not on file  . Years of education: Not on file  . Highest education level: Not on file  Social Needs  . Financial resource strain: Not on file  . Food insecurity - worry: Not on file  . Food insecurity - inability: Not on file  . Transportation needs - medical: Not on file  . Transportation needs - non-medical: Not on file  Occupational History  . Not on file  Tobacco Use  . Smoking status: Never Smoker  . Smokeless tobacco: Never Used  Substance and Sexual Activity  .  Alcohol use: No  . Drug use: No  . Sexual activity: Not Currently  Other Topics Concern  . Not on file  Social History Narrative  . Not on file    Family History  Problem Relation Age of Onset  . Other Mother        no health conditions per patient  . Other Father        no health conditions per patient   Review of Systems Pertinent negatives list in HPI  Patient Active Problem List   Diagnosis Date Noted  . Pain in left wrist 07/20/2017  . Schizophrenia (HCC) 05/01/2017  . Chronic pain of left knee 02/09/2017  . Unilateral primary osteoarthritis, left knee 02/09/2017  . Neuropathy 11/22/2016  . Gastroesophageal reflux disease without esophagitis 11/22/2016  . Weight gain 11/22/2016  . Atypical chest pain 11/04/2016    Allergies  Allergen Reactions  . Lidocaine     Face swelling     Prior to Admission medications   Medication Sig Start Date End Date Taking? Authorizing Provider  ARIPiprazole (ABILIFY) 5 MG tablet Take 1 tablet (5 mg total) by mouth daily. 01/18/18   Bing NeighborsHarris, Nishtha Raider S, FNP  QUEtiapine (SEROQUEL XR) 200 MG 24 hr tablet Take 1 tablet (200 mg total) by mouth at bedtime. 01/18/18   Bing NeighborsHarris, Teyton Pattillo S, FNP    Past Medical, Surgical Family and Social History reviewed and updated.    Objective:   Today's Vitals   01/30/18 0808  BP: (!) 123/54  Pulse: 76  Temp: 97.8 F (36.6 C)  TempSrc: Oral  SpO2: 100%  Weight: 160 lb (72.6 kg)  Height: 5\' 5"  (1.651 m)  PainSc: 10-Worst pain ever  PainLoc: Back    Wt Readings from Last 3 Encounters:  01/30/18 160 lb (72.6 kg)  01/18/18 162 lb (73.5 kg)  01/10/18 163 lb (73.9 kg)   Physical Exam  Constitutional: She appears well-developed and well-nourished. No distress.  HENT:  Right Ear: Hearing, tympanic membrane, external ear and ear canal normal.  Left Ear: Hearing, tympanic membrane, external ear and ear canal normal.  Nose: Mucosal edema and rhinorrhea present.  Mouth/Throat: Posterior oropharyngeal  edema and posterior oropharyngeal erythema present.  Neck: Normal range of motion.  Cardiovascular: Normal rate, regular rhythm, normal heart sounds and intact distal pulses.  Pulmonary/Chest: Effort normal and breath sounds normal.  Lymphadenopathy:    She has no cervical adenopathy.  Neurological: She is alert.  Skin: Skin is warm.  Psychiatric: Her affect is labile. Her speech is not rapid and/or pressured and not delayed. Cognition and memory are impaired. She expresses inappropriate judgment. She expresses no suicidal ideation. She expresses no suicidal plans.  Appears calm today. She is inattentive.   Assessment & Plan:  1. Undifferentiated schizophrenia (HCC), continue follow-up with [facility]. Social worker is not present during today's office visit. Will not resume or start any medication as patient is not willing to follow treatment plan. At present she denies any homicidal or suicidal ideations.   2. Upper respiratory tract infection, unspecified type, pharynx is erythematous and patient is congested. Symptoms present greater than 2 weeks, will treat with broad-spectrum antibiotic therapy.  RTC: 2 weeks to follow-up on schizophrenia and any concerns.    Godfrey Pick. Tiburcio Pea, MSN, FNP-C [facility]  [address] [place], [place] [address] [phone]

## 2018-01-30 NOTE — Patient Instructions (Addendum)
Start Azithromycin Take 2 tabs x 1 dose, then 1 tab every day for x 4 days.    Upper Respiratory Infection, Adult Most upper respiratory infections (URIs) are caused by a virus. A URI affects the nose, throat, and upper air passages. The most common type of URI is often called "the common cold." Follow these instructions at home:  Take medicines only as told by your doctor.  Gargle warm saltwater or take cough drops to comfort your throat as told by your doctor.  Use a warm mist humidifier or inhale steam from a shower to increase air moisture. This may make it easier to breathe.  Drink enough fluid to keep your pee (urine) clear or pale yellow.  Eat soups and other clear broths.  Have a healthy diet.  Rest as needed.  Go back to work when your fever is gone or your doctor says it is okay. ? You may need to stay home longer to avoid giving your URI to others. ? You can also wear a face mask and wash your hands often to prevent spread of the virus.  Use your inhaler more if you have asthma.  Do not use any tobacco products, including cigarettes, chewing tobacco, or electronic cigarettes. If you need help quitting, ask your doctor. Contact a doctor if:  You are getting worse, not better.  Your symptoms are not helped by medicine.  You have chills.  You are getting more short of breath.  You have brown or red mucus.  You have yellow or brown discharge from your nose.  You have pain in your face, especially when you bend forward.  You have a fever.  You have puffy (swollen) neck glands.  You have pain while swallowing.  You have white areas in the back of your throat. Get help right away if:  You have very bad or constant: ? Headache. ? Ear pain. ? Pain in your forehead, behind your eyes, and over your cheekbones (sinus pain). ? Chest pain.  You have long-lasting (chronic) lung disease and any of the following: ? Wheezing. ? Long-lasting cough. ? Coughing up  blood. ? A change in your usual mucus.  You have a stiff neck.  You have changes in your: ? Vision. ? Hearing. ? Thinking. ? Mood. This information is not intended to replace advice given to you by your health care provider. Make sure you discuss any questions you have with your health care provider. Document Released: 04/19/2008 Document Revised: 07/04/2016 Document Reviewed: 02/06/2014 Elsevier Interactive Patient Education  2018 [employer]

## 2018-01-30 NOTE — ED Triage Notes (Addendum)
Per pt, states back pain and sore throat which started today-no trauma/injury-states chronic back pain, no dysuria

## 2018-02-02 LAB — CULTURE, GROUP A STREP (THRC)

## 2018-02-13 ENCOUNTER — Encounter: Payer: Self-pay | Admitting: Family Medicine

## 2018-02-13 ENCOUNTER — Ambulatory Visit (INDEPENDENT_AMBULATORY_CARE_PROVIDER_SITE_OTHER): Payer: Medicaid Other | Admitting: Family Medicine

## 2018-02-13 VITALS — BP 132/60 | HR 62 | Temp 98.1°F | Resp 16 | Ht 65.0 in | Wt 162.0 lb

## 2018-02-13 DIAGNOSIS — F203 Undifferentiated schizophrenia: Secondary | ICD-10-CM

## 2018-02-13 NOTE — Progress Notes (Signed)
Patient ID: Felicia LatheSheila Ellen, female    DOB: [birth year], 62 y.o.   MRN: [id number]  PCP: Bing NeighborsHarris, Seeley Southgate S, FNP  Chief Complaint  Patient presents with  . Follow-up    2 weeks on URI    Subjective:  HPI Felicia Collier is a 62 y.o. female presents for evaluation of URI and mental health wellness visit. She is accompanied today by social worker Chitquita Stribin. She has been to [facility] and seen by Dr. Merlyn AlbertFred. She refused her Haldol and reports today, that she was told she doesn't have to take medication. She feels that she doesn't need any medication for her schizophrenia. She stopped Seroquel and Abilify " felt that she was swelling up". During her last office visit she was treated for URI and within the same day presented to [facility] ED for the same complaint and never filled medication that was prescribed for URI. Felicia Collier then proceeds to tell me that she has a "bat in her back to so that she can have her baby". Today she reports that she is now willing to see another mental health provider and discontinue mental health services at [facility]. She denies any other concerns. Continues to endorse auditory hallucinations. Denies any thoughts of self harm or harming others. She doesn't remark that someone is "using her" although when asked to identify person, she moved to a different subject matter and continued to ramble. Social History   Socioeconomic History  . Marital status: Single    Spouse name: Not on file  . Number of children: Not on file  . Years of education: Not on file  . Highest education level: Not on file  Occupational History  . Not on file  Social Needs  . Financial resource strain: Not on file  . Food insecurity:    Worry: Not on file    Inability: Not on file  . Transportation needs:    Medical: Not on file    Non-medical: Not on file  Tobacco Use  . Smoking status: Never Smoker  . Smokeless tobacco: Never Used  Substance and Sexual Activity  . Alcohol use: No  . Drug  use: No  . Sexual activity: Not Currently  Lifestyle  . Physical activity:    Days per week: Not on file    Minutes per session: Not on file  . Stress: Not on file  Relationships  . Social connections:    Talks on phone: Not on file    Gets together: Not on file    Attends religious service: Not on file    Active member of club or organization: Not on file    Attends meetings of clubs or organizations: Not on file    Relationship status: Not on file  . Intimate partner violence:    Fear of current or ex partner: Not on file    Emotionally abused: Not on file    Physically abused: Not on file    Forced sexual activity: Not on file  Other Topics Concern  . Not on file  Social History Narrative  . Not on file    Family History  Problem Relation Age of Onset  . Other Mother        no health conditions per patient  . Other Father        no health conditions per patient   Review of Systems Pertinent negatives included in HPI  Patient Active Problem List   Diagnosis Date Noted  . Pain in left wrist  07/20/2017  . Schizophrenia (HCC) 05/01/2017  . Chronic pain of left knee 02/09/2017  . Unilateral primary osteoarthritis, left knee 02/09/2017  . Neuropathy 11/22/2016  . Gastroesophageal reflux disease without esophagitis 11/22/2016  . Weight gain 11/22/2016  . Atypical chest pain 11/04/2016    Allergies  Allergen Reactions  . Lidocaine     Face swelling     Prior to Admission medications   Medication Sig Start Date End Date Taking? Authorizing Provider  azithromycin (ZITHROMAX) 250 MG tablet Take 2 tabs PO x 1 dose, then 1 tab PO QD x 4 days Patient not taking: Reported on 02/13/2018 01/30/18   Bing Neighbors, FNP    Past Medical, Surgical Family and Social History reviewed and updated.    Objective:   Today's Vitals   02/13/18 0803  BP: 132/60  Pulse: 62  Resp: 16  Temp: 98.1 F (36.7 C)  TempSrc: Oral  SpO2: 99%  Weight: 162 lb (73.5 kg)  Height: 5'  5" (1.651 m)    Wt Readings from Last 3 Encounters:  02/13/18 162 lb (73.5 kg)  01/30/18 160 lb (72.6 kg)  01/18/18 162 lb (73.5 kg)   Physical Exam  Constitutional: She is oriented to person, place, and time. She appears well-developed and well-nourished.  HENT:  Head: Normocephalic and atraumatic.  Eyes: Pupils are equal, round, and reactive to light.  Neck: Normal range of motion. Neck supple.  Cardiovascular: Normal rate and regular rhythm.  Pulmonary/Chest: Effort normal and breath sounds normal.  Musculoskeletal: Normal range of motion.  Neurological: She is alert and oriented to person, place, and time.  Skin: Skin is warm and dry.  Psychiatric: Her mood appears anxious. Her affect is labile. Her speech is rapid and/or pressured. Thought content is delusional. Cognition and memory are impaired. She expresses inappropriate judgment.  Behavior was calm although agitation occurred when she discussed concerns that caused her distress.   Assessment & Plan:  1. Undifferentiated schizophrenia [facility]), Ambulatory referral to Psychology.Patient has agreed to be seen at [facility]. I have placed referral today. I strongly recommend impatient admission for patient as her overall mental status is severely impaired to the point I am observing more episodes of psychosis and decreased ability to relate to reality. It is imperative that she is seen and evaluated by a qualified mental health provider. She is completely oppose to medication therapy for mental health symptoms.    A total of 25 minutes spent, greater than 50 % of this time was spent reviewing prior medical history, reviewing medications and indications of treatment, discuss care plan with patient and social worker, health promotion, and goals of treatment.   RTC: you will see a new provider, Julianne Handler, FNP at your 4 week follow-up  Godfrey Pick. Tiburcio Pea, MSN, FNP-C [facility]  [address] [place], [place] [address] [phone]

## 2018-02-15 ENCOUNTER — Emergency Department (HOSPITAL_COMMUNITY)
Admission: EM | Admit: 2018-02-15 | Discharge: 2018-02-15 | Disposition: A | Discharge status: Home / Self Care | Payer: Medicaid Other | Attending: Emergency Medicine | Admitting: Emergency Medicine

## 2018-02-15 ENCOUNTER — Encounter (HOSPITAL_COMMUNITY): Payer: Self-pay

## 2018-02-15 ENCOUNTER — Emergency Department (HOSPITAL_COMMUNITY)
Admission: EM | Admit: 2018-02-15 | Discharge: 2018-02-15 | Disposition: A | Discharge status: Home / Self Care | Payer: Medicaid Other | Source: Home / Self Care | Attending: Emergency Medicine | Admitting: Emergency Medicine

## 2018-02-15 ENCOUNTER — Other Ambulatory Visit: Payer: Self-pay

## 2018-02-15 ENCOUNTER — Encounter (HOSPITAL_COMMUNITY): Payer: Self-pay | Admitting: Emergency Medicine

## 2018-02-15 ENCOUNTER — Emergency Department (HOSPITAL_COMMUNITY): Payer: Medicaid Other

## 2018-02-15 DIAGNOSIS — J069 Acute upper respiratory infection, unspecified: Secondary | ICD-10-CM

## 2018-02-15 DIAGNOSIS — R05 Cough: Secondary | ICD-10-CM | POA: Diagnosis present

## 2018-02-15 DIAGNOSIS — J40 Bronchitis, not specified as acute or chronic: Secondary | ICD-10-CM | POA: Diagnosis not present

## 2018-02-15 LAB — RAPID STREP SCREEN (MED CTR MEBANE ONLY): STREPTOCOCCUS, GROUP A SCREEN (DIRECT): NEGATIVE

## 2018-02-15 MED ORDER — BENZONATATE 100 MG PO CAPS: 100.0000 mg | ORAL_CAPSULE | Freq: Three times a day (TID) | ORAL | 0 refills | Status: DC | PRN

## 2018-02-15 MED ORDER — AZITHROMYCIN 250 MG PO TABS: 250.0000 mg | ORAL_TABLET | Freq: Every day | ORAL | 0 refills | Status: DC

## 2018-02-15 MED ORDER — DEXAMETHASONE SODIUM PHOSPHATE 10 MG/ML IJ SOLN
10.0000 mg | Freq: Once | INTRAMUSCULAR | Status: AC
  Administered 2018-02-15: 10 mg via INTRAMUSCULAR
  Filled 2018-02-15: qty 1

## 2018-02-15 NOTE — ED Notes (Signed)
Pt called out again while this nurse was upstairs, unable to assist at this time.

## 2018-02-15 NOTE — ED Notes (Signed)
Arrived to this pts room with discharge paperwork, pt had left without prescriptions, discharge instructions or vital signs.

## 2018-02-15 NOTE — ED Notes (Signed)
Pt calling out for discharge paperwork while this nurse was on another floor and unable to respond.

## 2018-02-15 NOTE — ED Triage Notes (Signed)
Patient here from home with complaints of dry cough, sore throat x4 days.

## 2018-02-15 NOTE — ED Triage Notes (Addendum)
Patient reports that she was seen this AM around 0100 today and had a throat swab which was negative. Patient states, "Something is wrong. My throat hurts." Patient also c/o a productive cough with green sputum.

## 2018-02-15 NOTE — ED Provider Notes (Signed)
[facility]-EMERGENCY DEPT Provider Note   CSN: [id number] Arrival date & time: 02/15/18  0013     History   Chief Complaint Chief Complaint  Patient presents with  . Sore Throat  . Cough    HPI Felicia Collier is a 62 y.o. female.  The history is provided by the patient and medical records. No language interpreter was used.  Sore Throat  Pertinent negatives include no chest pain, no abdominal pain and no shortness of breath.  Cough  Associated symptoms include sore throat. Pertinent negatives include no chest pain, no chills, no shortness of breath and no wheezing.   Felicia LatheSheila Felicia Collier is a 62 y.o. female  with a PMH as listed below who presents to the Emergency Department complaining of persistent dry cough and sore throat for the last 2 weeks.  Patient states that she has taken no medications prior to arrival for her symptoms.  She denies alleviating or aggravating factors.  She is not a smoker.  No fever, chills, chest pain, shortness of breath, abdominal pain, nausea or vomiting.  No known sick contacts.   Past Medical History:  Diagnosis Date  . Chronic back pain   . Chronic knee pain   . Schizophrenia [name](HCC)     Patient Active Problem List   Diagnosis Date Noted  . Pain in left wrist 07/20/2017  . Schizophrenia (HCC) 05/01/2017  . Chronic pain of left knee 02/09/2017  . Unilateral primary osteoarthritis, left knee 02/09/2017  . Neuropathy 11/22/2016  . Gastroesophageal reflux disease without esophagitis 11/22/2016  . Weight gain 11/22/2016  . Atypical chest pain 11/04/2016    Past Surgical History:  Procedure Laterality Date  . ABDOMINAL HYSTERECTOMY    . CHOLECYSTECTOMY       OB History   None      Home Medications    Prior to Admission medications   Medication Sig Start Date End Date Taking? Authorizing Provider  azithromycin (ZITHROMAX) 250 MG tablet Take 1 tablet (250 mg total) by mouth daily. Take first 2 tablets together, then 1  every day until finished. 02/15/18   Mellony Danziger, Chase PicketJaime Pilcher, PA-C  benzonatate (TESSALON) 100 MG capsule Take 1 capsule (100 mg total) by mouth 3 (three) times daily as needed for cough. 02/15/18   Orvetta Danielski, Chase PicketJaime Pilcher, PA-C    Family History Family History  Problem Relation Age of Onset  . Other Mother        no health conditions per patient  . Other Father        no health conditions per patient    Social History Social History   Tobacco Use  . Smoking status: Never Smoker  . Smokeless tobacco: Never Used  Substance Use Topics  . Alcohol use: No  . Drug use: No     Allergies   Lidocaine   Review of Systems Review of Systems  Constitutional: Negative for chills and fever.  HENT: Positive for congestion and sore throat.   Respiratory: Positive for cough. Negative for shortness of breath and wheezing.   Cardiovascular: Negative for chest pain.  Gastrointestinal: Negative for abdominal pain, diarrhea, nausea and vomiting.     Physical Exam Updated Vital Signs BP 132/78 (BP Location: Left Arm)   Pulse 66   Temp 98.4 F (36.9 C) (Oral)   Resp 16   SpO2 98%   Physical Exam  Constitutional: She is oriented to person, place, and time. She appears well-developed and well-nourished. No distress.  HENT:  Head: Normocephalic  and atraumatic.  No focal areas of sinus tenderness. OP with erythema, no exudates or tonsillar hypertrophy. + nasal congestion with mucosal edema.   Neck: Normal range of motion. Neck supple.  Cardiovascular: Normal rate, regular rhythm and normal heart sounds.  Pulmonary/Chest: Effort normal.  Lungs are clear to auscultation bilaterally - no w/r/r  Abdominal: Soft. She exhibits no distension. There is no tenderness.  Musculoskeletal: Normal range of motion.  Neurological: She is alert and oriented to person, place, and time.  Skin: Skin is warm and dry. She is not diaphoretic.  Nursing note and vitals reviewed.    ED Treatments / Results   Labs (all labs ordered are listed, but only abnormal results are displayed) Labs Reviewed  RAPID STREP SCREEN (NOT AT [facility])  CULTURE, GROUP A STREP [facility])    EKG None  Radiology Dg Chest 2 View  Result Date: 02/15/2018 CLINICAL DATA:  Cough, fever EXAM: CHEST - 2 VIEW COMPARISON:  11/12/2017 FINDINGS: Mild bronchitic changes. No focal opacity or pleural effusion. Borderline cardiomegaly. No pneumothorax. Degenerative changes of the spine. Surgical clips in the right upper quadrant. IMPRESSION: No active cardiopulmonary disease.  Mild bronchitic changes. Electronically Signed   By: Jasmine Pang M.D.   On: 02/15/2018 01:02    Procedures Procedures (including critical care time)  Medications Ordered in ED Medications - No data to display   Initial Impression / Assessment and Plan / ED Course  I have reviewed the triage vital signs and the nursing notes.  Pertinent labs & imaging results that were available during my care of the patient were reviewed by me and considered in my medical decision making (see chart for details).    Felicia Collier is a 63 y.o. female who presents to ED for persistent dry cough and sore throat for 2 weeks.  She is not a smoker.  On exam, patient is well-appearing, afebrile, hemodynamically stable with a clear lung exam.  Oropharynx with mild erythema, but no exudates or tonsillar hypertrophy.  Chest x-ray with mild bronchitic changes, otherwise reassuring.  Rapid strep negative.. Sxs today likely due to viral URI.Symptomatic home care instructions discussed. Rx for Tessalon given. PCP follow up strongly encouraged if symptoms persist. Reasons to return to ER discussed. All questions answered.  Patient eloped following our conversation without receiving discharge paperwork.  I did go over everything in person that was listed on discharge summary.  Blood pressure 132/78, pulse 66, temperature 98.4 F (36.9 C), temperature source Oral, resp. rate 16, SpO2 98  %.   Final Clinical Impressions(s) / ED Diagnoses   Final diagnoses:  Bronchitis    ED Discharge Orders        Ordered    azithromycin (ZITHROMAX) 250 MG tablet  Daily     02/15/18 0158    benzonatate (TESSALON) 100 MG capsule  3 times daily PRN     02/15/18 0158       Nephi Savage, Chase Picket, PA-C 02/15/18 [id number]    Geoffery Lyons, MD 02/15/18 [phone]

## 2018-02-15 NOTE — ED Provider Notes (Signed)
[facility]-EMERGENCY DEPT Provider Note   CSN: [id number] Arrival date & time: 02/15/18  1035     History   Chief Complaint Chief Complaint  Patient presents with  . Sore Throat  . Cough    HPI Felicia Collier is a 62 y.o. female who was seen at 1 AM this morning for sore throat and cough who had a negative chest x-ray.  She returns because she states "something is wrong in my throat still hurts."  Patient states that she continues to cough and has a sore throat but does not have any change in voice, difficulty swallowing, fever, chills, nausea, vomiting or any worsening condition since this morning.  She is not a smoker denies shortness of breath.  She denies chest pain.  She denies ear pain or history of seasonal allergies or asthma.  HPI  Past Medical History:  Diagnosis Date  . Chronic back pain   . Chronic knee pain   . Schizophrenia [facility])     Patient Active Problem List   Diagnosis Date Noted  . Pain in left wrist 07/20/2017  . Schizophrenia (HCC) 05/01/2017  . Chronic pain of left knee 02/09/2017  . Unilateral primary osteoarthritis, left knee 02/09/2017  . Neuropathy 11/22/2016  . Gastroesophageal reflux disease without esophagitis 11/22/2016  . Weight gain 11/22/2016  . Atypical chest pain 11/04/2016    Past Surgical History:  Procedure Laterality Date  . ABDOMINAL HYSTERECTOMY    . CHOLECYSTECTOMY       OB History   None      Home Medications    Prior to Admission medications   Medication Sig Start Date End Date Taking? Authorizing Provider  benzonatate (TESSALON) 100 MG capsule Take 1 capsule (100 mg total) by mouth 3 (three) times daily as needed for cough. 02/15/18   Ward, Chase Picket, PA-C    Family History Family History  Problem Relation Age of Onset  . Other Mother        no health conditions per patient  . Other Father        no health conditions per patient    Social History Social History   Tobacco Use  .  Smoking status: Never Smoker  . Smokeless tobacco: Never Used  Substance Use Topics  . Alcohol use: No  . Drug use: No     Allergies   Lidocaine   Review of Systems Review of Systems  Ten systems reviewed and are negative for acute change, except as noted in the HPI.   Physical Exam Updated Vital Signs BP 134/76 (BP Location: Left Arm)   Pulse 72   Temp 97.7 F (36.5 C) (Oral)   Resp 16   Ht 5\' 5"  (1.651 m)   Wt 73.5 kg (162 lb)   SpO2 97%   BMI 26.96 kg/m   Physical Exam  Physical Exam  Nursing note and vitals reviewed. Constitutional: She is oriented to person, place, and time. She appears well-developed and well-nourished. No distress.  HENT:  Head: Normocephalic and atraumatic.  Eyes: Conjunctivae normal and EOM are normal. Pupils are equal, round, and reactive to light. No scleral icterus.  Neck: Normal range of motion.  Mouth: Oropharynx with mild erythema, no exudates or swelling uvula midline.  Mild hoarseness of voice Cardiovascular: Normal rate, regular rhythm and normal heart sounds.  Exam reveals no gallop and no friction rub.   No murmur heard. Pulmonary/Chest: Effort normal and breath sounds normal.  Frequent dry cough  Abdominal: Soft.  Bowel sounds are normal. She exhibits no distension and no mass. There is no tenderness. There is no guarding.  Neurological: She is alert and oriented to person, place, and time.  Skin: Skin is warm and dry. She is not diaphoretic.    ED Treatments / Results  Labs (all labs ordered are listed, but only abnormal results are displayed) Labs Reviewed - No data to display  EKG None  Radiology Dg Chest 2 View  Result Date: 02/15/2018 CLINICAL DATA:  Cough, fever EXAM: CHEST - 2 VIEW COMPARISON:  11/12/2017 FINDINGS: Mild bronchitic changes. No focal opacity or pleural effusion. Borderline cardiomegaly. No pneumothorax. Degenerative changes of the spine. Surgical clips in the right upper quadrant. IMPRESSION: No  active cardiopulmonary disease.  Mild bronchitic changes. Electronically Signed   By: Jasmine PangKim  Fujinaga M.D.   On: 02/15/2018 01:02    Procedures Procedures (including critical care time)  Medications Ordered in ED Medications  dexamethasone (DECADRON) injection 10 mg (has no administration in time range)     Initial Impression / Assessment and Plan / ED Course  I have reviewed the triage vital signs and the nursing notes.  Pertinent labs & imaging results that were available during my care of the patient were reviewed by me and considered in my medical decision making (see chart for details).     Patient seen and is well-appearing.  I had a long discussion with her about viral illnesses.  She is not taking any medication for symptoms and advised over-the-counter treatment with DayQuil NyQuil or similar medication, throat lozenges and supportive care.  We discussed the fact that antibiotics will not take care of her symptoms.  Think she will feel greatly improved with medications.  She did not fill her Tessalon.  She may take the medications I have discussed with her and discontinue use of Tessalon.  She appears appropriate for discharge at this time.  Patient given shot of Decadron for decrease in what may be some bronchospasm.  Final Clinical Impressions(s) / ED Diagnoses   Final diagnoses:  Upper respiratory tract infection, unspecified type    ED Discharge Orders    None       Arthor CaptainHarris, Amilcar Reever, PA-C 02/15/18 1140    Raeford RazorKohut, Stephen, MD 02/16/18 [phone]

## 2018-02-15 NOTE — Discharge Instructions (Addendum)
Take over the counter DayQuil/NyQuil for your cold. Drink plenty of hot tea and soup. Rest as much as possible. Use throat losenges for sore throat and gargle with warm salt water in the morning and before bed.  You appear to have an upper respiratory infection (URI). An upper respiratory tract infection, or cold, is a viral infection of the air passages leading to the lungs. It is contagious and can be spread to others, especially during the first 3 or 4 days. It cannot be cured by antibiotics or other medicines. RETURN IMMEDIATELY IF you develop shortness of breath, confusion or altered mental status, a new rash, become dizzy, faint, or poorly responsive, or are unable to be cared for at home.

## 2018-02-15 NOTE — Discharge Instructions (Addendum)
It was my pleasure taking care of you today!   Take Tessalon as needed for cough.   Follow up with your primary care doctor.   Return to ER for new or worsening symptoms, any additional concerns.

## 2018-02-17 LAB — CULTURE, GROUP A STREP (THRC)

## 2018-02-20 ENCOUNTER — Emergency Department (HOSPITAL_COMMUNITY): Payer: Medicaid Other

## 2018-02-20 ENCOUNTER — Encounter (HOSPITAL_COMMUNITY): Payer: Self-pay | Admitting: Family Medicine

## 2018-02-20 DIAGNOSIS — R05 Cough: Secondary | ICD-10-CM | POA: Insufficient documentation

## 2018-02-20 DIAGNOSIS — Z5321 Procedure and treatment not carried out due to patient leaving prior to being seen by health care provider: Secondary | ICD-10-CM | POA: Insufficient documentation

## 2018-02-20 NOTE — ED Triage Notes (Signed)
Patient reports she is experiencing a productive cough since last week but got worse today. Patient reports a fever but unmeasured. Also, reports left sided neck and shoulder pain but relates the pain to coughing. Patient was seen on 02/15/2018 for same symptoms.

## 2018-02-21 ENCOUNTER — Emergency Department (HOSPITAL_COMMUNITY)
Admission: EM | Admit: 2018-02-21 | Discharge: 2018-02-21 | Disposition: A | Discharge status: Home / Self Care | Payer: Medicaid Other | Attending: Emergency Medicine | Admitting: Emergency Medicine

## 2018-02-21 NOTE — ED Notes (Signed)
Pt called to take to treatment room  No response from lobby 

## 2018-02-22 ENCOUNTER — Emergency Department (HOSPITAL_COMMUNITY): Payer: Medicaid Other

## 2018-02-22 ENCOUNTER — Emergency Department (HOSPITAL_COMMUNITY)
Admission: EM | Admit: 2018-02-22 | Discharge: 2018-02-22 | Disposition: A | Discharge status: Other Acute Inpatient Hospital | Payer: Medicaid Other | Attending: Emergency Medicine | Admitting: Emergency Medicine

## 2018-02-22 ENCOUNTER — Encounter (HOSPITAL_COMMUNITY): Payer: Self-pay

## 2018-02-22 DIAGNOSIS — M549 Dorsalgia, unspecified: Secondary | ICD-10-CM

## 2018-02-22 DIAGNOSIS — F22 Delusional disorders: Secondary | ICD-10-CM | POA: Diagnosis not present

## 2018-02-22 DIAGNOSIS — M542 Cervicalgia: Secondary | ICD-10-CM | POA: Diagnosis not present

## 2018-02-22 DIAGNOSIS — R05 Cough: Secondary | ICD-10-CM | POA: Insufficient documentation

## 2018-02-22 DIAGNOSIS — R42 Dizziness and giddiness: Secondary | ICD-10-CM | POA: Insufficient documentation

## 2018-02-22 DIAGNOSIS — F203 Undifferentiated schizophrenia: Secondary | ICD-10-CM | POA: Insufficient documentation

## 2018-02-22 DIAGNOSIS — M546 Pain in thoracic spine: Secondary | ICD-10-CM | POA: Diagnosis present

## 2018-02-22 DIAGNOSIS — R10816 Epigastric abdominal tenderness: Secondary | ICD-10-CM | POA: Insufficient documentation

## 2018-02-22 DIAGNOSIS — Z008 Encounter for other general examination: Secondary | ICD-10-CM

## 2018-02-22 DIAGNOSIS — R410 Disorientation, unspecified: Secondary | ICD-10-CM | POA: Diagnosis not present

## 2018-02-22 DIAGNOSIS — R52 Pain, unspecified: Secondary | ICD-10-CM

## 2018-02-22 LAB — COMPREHENSIVE METABOLIC PANEL
ALBUMIN: 3.6 g/dL (ref 3.5–5.0)
ALK PHOS: 94 U/L (ref 38–126)
ALT: 15 U/L (ref 14–54)
AST: 17 U/L (ref 15–41)
Anion gap: 10 (ref 5–15)
BILIRUBIN TOTAL: 1 mg/dL (ref 0.3–1.2)
BUN: 10 mg/dL (ref 6–20)
CALCIUM: 8.9 mg/dL (ref 8.9–10.3)
CO2: 26 mmol/L (ref 22–32)
Chloride: 103 mmol/L (ref 101–111)
Creatinine, Ser: 0.69 mg/dL (ref 0.44–1.00)
GFR calc Af Amer: >60 mL/min (ref >60)
GFR calc non Af Amer: >60 mL/min (ref >60)
GLUCOSE: 83 mg/dL (ref 65–99)
Potassium: 3.3 mmol/L — ABNORMAL LOW (ref 3.5–5.1)
Sodium: 139 mmol/L (ref 135–145)
TOTAL PROTEIN: 7.2 g/dL (ref 6.5–8.1)

## 2018-02-22 LAB — RAPID URINE DRUG SCREEN, HOSP PERFORMED
Amphetamines: NOT DETECTED
BARBITURATES: NOT DETECTED
BENZODIAZEPINES: NOT DETECTED
COCAINE: NOT DETECTED
Opiates: NOT DETECTED
TETRAHYDROCANNABINOL: NOT DETECTED

## 2018-02-22 LAB — CBC WITH DIFFERENTIAL/PLATELET
BASOS ABS: 0 10*3/uL (ref 0.0–0.1)
BASOS PCT: 0 %
Eosinophils Absolute: 0.3 10*3/uL (ref 0.0–0.7)
Eosinophils Relative: 3 %
HEMATOCRIT: 39.3 % (ref 36.0–46.0)
HEMOGLOBIN: 12.5 g/dL (ref 12.0–15.0)
Lymphocytes Relative: 21 %
Lymphs Abs: 2 10*3/uL (ref 0.7–4.0)
MCH: 26.4 pg (ref 26.0–34.0)
MCHC: 31.8 g/dL (ref 30.0–36.0)
MCV: 83.1 fL (ref 78.0–100.0)
MONOS PCT: 9 %
Monocytes Absolute: 0.8 10*3/uL (ref 0.1–1.0)
NEUTROS ABS: 6.4 10*3/uL (ref 1.7–7.7)
NEUTROS PCT: 67 %
Platelets: 310 10*3/uL (ref 150–400)
RBC: 4.73 MIL/uL (ref 3.87–5.11)
RDW: 14.3 % (ref 11.5–15.5)
WBC: 9.5 10*3/uL (ref 4.0–10.5)

## 2018-02-22 LAB — ETHANOL: Alcohol, Ethyl (B): <10 mg/dL (ref <10)

## 2018-02-22 LAB — URINALYSIS, ROUTINE W REFLEX MICROSCOPIC
BACTERIA UA: NONE SEEN
BILIRUBIN URINE: NEGATIVE
Glucose, UA: NEGATIVE mg/dL
HGB URINE DIPSTICK: NEGATIVE
Ketones, ur: NEGATIVE mg/dL
NITRITE: NEGATIVE
PROTEIN: NEGATIVE mg/dL
RBC / HPF: NONE SEEN RBC/hpf (ref 0–5)
SPECIFIC GRAVITY, URINE: 1.005 (ref 1.005–1.030)
WBC, UA: NONE SEEN WBC/hpf (ref 0–5)
pH: 7 (ref 5.0–8.0)

## 2018-02-22 LAB — I-STAT TROPONIN, ED: Troponin i, poc: 0 ng/mL (ref 0.00–0.08)

## 2018-02-22 LAB — SALICYLATE LEVEL

## 2018-02-22 LAB — BRAIN NATRIURETIC PEPTIDE: B Natriuretic Peptide: 14 pg/mL (ref 0.0–100.0)

## 2018-02-22 LAB — ACETAMINOPHEN LEVEL: Acetaminophen (Tylenol), Serum: <10 ug/mL — ABNORMAL LOW (ref 10–30)

## 2018-02-22 MED ORDER — SODIUM CHLORIDE 0.9 % IJ SOLN
INTRAMUSCULAR | Status: AC
  Administered 2018-02-22: 11:00:00
  Filled 2018-02-22: qty 50

## 2018-02-22 MED ORDER — LORAZEPAM 1 MG PO TABS: 1.0000 mg | ORAL_TABLET | ORAL | Status: DC | PRN

## 2018-02-22 MED ORDER — IOPAMIDOL (ISOVUE-370) INJECTION 76%
100.0000 mL | Freq: Once | INTRAVENOUS | Status: AC | PRN
  Administered 2018-02-22: 100 mL via INTRAVENOUS

## 2018-02-22 MED ORDER — IBUPROFEN 800 MG PO TABS: 800.0000 mg | ORAL_TABLET | Freq: Four times a day (QID) | ORAL | Status: DC | PRN

## 2018-02-22 MED ORDER — SODIUM CHLORIDE 0.9 % IV BOLUS: 1000.0000 mL | Freq: Once | INTRAVENOUS | Status: DC

## 2018-02-22 MED ORDER — IOPAMIDOL (ISOVUE-370) INJECTION 76%
INTRAVENOUS | Status: AC
  Administered 2018-02-22: 100 mL via INTRAVENOUS
  Filled 2018-02-22: qty 100

## 2018-02-22 MED ORDER — ACETAMINOPHEN 325 MG PO TABS: 650.0000 mg | ORAL_TABLET | Freq: Four times a day (QID) | ORAL | Status: DC | PRN

## 2018-02-22 NOTE — Progress Notes (Signed)
CSW received a call from Felicia Collier Pt has been accepted by: [facility] Number for report is: [phone] [facility] Accepting physician: Dr. Dorathy DaftNadia Collier Pt can arrive ASAP on 02/22/18  CSW will update RN and EDP.  Felicia PeaJonathan F. Dimitri Shakespeare, LCSW, LCAS, CSI Clinical Social Worker Ph: [phone]

## 2018-02-22 NOTE — ED Notes (Signed)
Bed: [id number] Expected date:  Expected time:  Means of arrival:  Comments: Room 18

## 2018-02-22 NOTE — ED Notes (Addendum)
Still unable to give report, will attempt again

## 2018-02-22 NOTE — ED Notes (Signed)
Pt's son has returned and brought clothing

## 2018-02-22 NOTE — ED Triage Notes (Signed)
Pt complains of upper back pain that woke her up this am, pt is not making sense when asking her questions

## 2018-02-22 NOTE — ED Notes (Signed)
Pt's son is here to see.  Admission discussed with son present with patients premission.  Contact information given, son will bring clothes for pt.  Permission from son to give her car keys to him to move her car home.

## 2018-02-22 NOTE — ED Notes (Signed)
[facility] contacted for transport 

## 2018-02-22 NOTE — ED Notes (Signed)
On the phone 

## 2018-02-22 NOTE — ED Notes (Addendum)
Pt ambulatory w/o difficulty with [facility].  Face sheet, assessment notes, MAR report, EMTELA, Lab reports, transfer report and belongings sent with driver.  [facility].

## 2018-02-22 NOTE — ED Notes (Signed)
[facility] per [employer] CSW.  Pt is agreable with transfer and will notify son per Jonny RuizJohn.

## 2018-02-22 NOTE — ED Notes (Signed)
Up to the bathroom 

## 2018-02-22 NOTE — BH Assessment (Signed)
Tele Assessment Note   Patient Name: Felicia Collier MRN: [id number] Referring Physician: Charlynne Pander, MD Location of Patient:  [facility] Location of Provider: Behavioral Health TTS Department  Felicia Collier is an 62 y.o. female present to [facility] with visual hallucinations. Patient describes that she sees bats and other different animals. Patient poor historian due to tangential speech. Patient has history of Schizophena. Patient does not present with SI and HI.  Per EDP note  Patient is very tangential in her speech. During the interview, patient started talking about people trying to steal her social security check.  She then proceeded to tell me that "everyone is trying to get her".  She did admit to history of depression but she states that the psychiatric medicines do not work and she has not been taking them.       Diagnosis: F20.9   Schizophrenia      Past Medical History:  Past Medical History:  Diagnosis Date  . Chronic back pain   . Chronic knee pain   . Schizophrenia [facility])     Past Surgical History:  Procedure Laterality Date  . ABDOMINAL HYSTERECTOMY    . CHOLECYSTECTOMY      Family History:  Family History  Problem Relation Age of Onset  . Other Mother        no health conditions per patient  . Other Father        no health conditions per patient    Social History:  reports that she has never smoked. She has never used smokeless tobacco. She reports that she does not drink alcohol or use drugs.  Additional Social History:  Alcohol / Drug Use Pain Medications: see MAR Prescriptions: see MAR Over the Counter: see MAR History of alcohol / drug use?: No history of alcohol / drug abuse  CIWA: CIWA-Ar BP: (!) 144/104 Pulse Rate: 73 COWS:    Allergies:  Allergies  Allergen Reactions  . Lidocaine     Face swelling     Home Medications:  (Not in a hospital admission)  OB/GYN Status:  No LMP recorded. Patient has had a hysterectomy.  General  Assessment Data Location of Assessment: [facility] TTS Assessment: In system Is this a Tele or Face-to-Face Assessment?: Face-to-Face Is this an Initial Assessment or a Re-assessment for this encounter?: Initial Assessment Marital status: Single Maiden name: n/a Is patient pregnant?: No Pregnancy Status: No Living Arrangements: Other (Comment)(unble to determine) Can pt return to current living arrangement?: Yes Admission Status: Voluntary Is patient capable of signing voluntary admission?: Yes Referral Source: Self/Family/Friend Insurance type: Medicaid     Crisis Care Plan Living Arrangements: Other (Comment)(unble to determine) Legal Guardian: Other:(unknown) Name of Psychiatrist: UTA Name of Therapist: UTA  Education Status Is patient currently in school?: No Is the patient employed, unemployed or receiving disability?: Unemployed  Risk to self with the past 6 months Suicidal Ideation: No Has patient been a risk to self within the past 6 months prior to admission? : No Suicidal Intent: No Has patient had any suicidal intent within the past 6 months prior to admission? : No Is patient at risk for suicide?: No Suicidal Plan?: No Has patient had any suicidal plan within the past 6 months prior to admission? : No Access to Means: No What has been your use of drugs/alcohol within the last 12 months?: n/a Previous Attempts/Gestures: No How many times?: 0 Other Self Harm Risks: non report Triggers for Past Attempts: Unknown Intentional Self Injurious Behavior: None Family  Suicide History: Unable to assess Recent stressful life event(s): Other (Comment)(UTA) Persecutory voices/beliefs?: No Depression: (UTA) Substance abuse history and/or treatment for substance abuse?: No Suicide prevention information given to non-admitted patients: Not applicable  Risk to Others within the past 6 months Homicidal Ideation: No Does patient have any lifetime risk of violence toward others  beyond the six months prior to admission? : No Thoughts of Harm to Others: No Current Homicidal Intent: No Current Homicidal Plan: No Access to Homicidal Means: No Identified Victim: n/a History of harm to others?: No Assessment of Violence: None Noted Does patient have access to weapons?: No Criminal Charges Pending?: No Does patient have a court date: No Is patient on probation?: No  Psychosis Hallucinations: None noted Delusions: None noted  Mental Status Report Appearance/Hygiene: Disheveled Eye Contact: Fair Motor Activity: Freedom of movement Speech: Tangential Level of Consciousness: Alert Mood: Preoccupied Affect: Inconsistent with thought content Anxiety Level: None Thought Processes: Tangential Judgement: Unable to Assess Orientation: Unable to assess Obsessive Compulsive Thoughts/Behaviors: Unable to Assess  Cognitive Functioning Concentration: Poor Memory: Unable to Assess Is patient IDD: No Is patient DD?: No Insight: Poor Impulse Control: Poor Appetite: Poor Have you had any weight changes? : No Change Sleep: Unable to Assess Vegetative Symptoms: Unable to Assess  ADLScreening [id number] [facility] Assessment Services) Patient's cognitive ability adequate to safely complete daily activities?: Yes Patient able to express need for assistance with ADLs?: Yes Independently performs ADLs?: Yes (appropriate for developmental age)  Prior Inpatient Therapy Prior Inpatient Therapy: No  Prior Outpatient Therapy Prior Outpatient Therapy: No Does patient have an ACCT team?: No Does patient have Intensive In-House Services?  : No Does patient have [facility] services? : No Does patient have P4CC services?: No  ADL Screening (condition at time of admission) Patient's cognitive ability adequate to safely complete daily activities?: Yes Is the patient deaf or have difficulty hearing?: No Does the patient have difficulty seeing, even when wearing glasses/contacts?: No Does the  patient have difficulty concentrating, remembering, or making decisions?: No Patient able to express need for assistance with ADLs?: Yes Does the patient have difficulty dressing or bathing?: No Independently performs ADLs?: Yes (appropriate for developmental age) Does the patient have difficulty walking or climbing stairs?: No       Abuse/Neglect Assessment (Assessment to be complete while patient is alone) Abuse/Neglect Assessment Can Be Completed: Yes Physical Abuse: Denies Verbal Abuse: Denies Sexual Abuse: Denies Exploitation of patient/patient's resources: Denies Self-Neglect: Denies     Merchant navy officerAdvance Directives (For Healthcare) Does Patient Have a Medical Advance Directive?: No Would patient like information on creating a medical advance directive?: No - Patient declined    Additional Information 1:1 In Past 12 Months?: No CIRT Risk: No Elopement Risk: No Does patient have medical clearance?: No     Disposition:  Disposition Initial Assessment Completed for this Encounter: Yes Disposition of Patient: Admit(Dr. Sharma CovertNorman & Carleene OverlieLaura Parks, NP, inpt tx ) Type of inpatient treatment program: Adult   Dian SituDelvondria Leen Tworek 02/22/2018 2:32 PM

## 2018-02-22 NOTE — ED Notes (Signed)
Attempting to call report.  

## 2018-02-22 NOTE — BH Assessment (Signed)
[facility]  Per Laveda AbbeLaurie Britton Parks, FNP, this pt requires psychiatric hospitalization at this time.  The following facilities have been contacted to seek placement for this pt, with results as noted:  Beds available, information sent, decision pending:  [facility]   At capacity:  [facility]   Heraclio Seidman, [place] Behavioral Health Coordinator [phone]

## 2018-02-22 NOTE — ED Notes (Signed)
Pt alert/oriented x3, pleasant, nad.  Pt denies si/hi/avh at this time.  Pt reports that she lives with her son.  Pt also reports stomach pain that resolved when she went to the bathroom.

## 2018-02-22 NOTE — ED Notes (Addendum)
Pt reports that she has a hx of schizophrenia and has been on multiple medications since the 1980's.  Pt reports that the most recent medications was prolixin injections/cogentin that she stopped getting last month because she had a reaction too.  Pt reports that yesterday she got a telephone call from social security about some type of fraud on her disability account, but she has not called them back.  She also reports bugs get into her house and into her bed,  Pt reports that her son is also affected.

## 2018-02-22 NOTE — ED Provider Notes (Signed)
[facility]-EMERGENCY DEPT Provider Note   CSN: [id number] Arrival date & time: 02/22/18  0605     History   Chief Complaint Chief Complaint  Patient presents with  . Delusional    HPI Felicia Collier is a 62 y.o. female hx of schizophrenia, chronic back pain, here with hallucinations, here presenting with back pain, neck pain, cough.  Patient was seen here about a week ago and was diagnosed with URI.  Patient states that she had persistent cough and upper back pain.  Patient is very tangential in her speech and cannot tell me exactly how long the pain has been going on for.  During the interview, patient started talking about people trying to steal her social security check.  He then proceeded to tell me that "everyone is trying to get her".  She did admit to history of depression but she states that the psychiatric medicines do not work and she has not been taking them.   The history is provided by the patient.    Past Medical History:  Diagnosis Date  . Chronic back pain   . Chronic knee pain   . Schizophrenia [facility])     Patient Active Problem List   Diagnosis Date Noted  . Pain in left wrist 07/20/2017  . Schizophrenia (HCC) 05/01/2017  . Chronic pain of left knee 02/09/2017  . Unilateral primary osteoarthritis, left knee 02/09/2017  . Neuropathy 11/22/2016  . Gastroesophageal reflux disease without esophagitis 11/22/2016  . Weight gain 11/22/2016  . Atypical chest pain 11/04/2016    Past Surgical History:  Procedure Laterality Date  . ABDOMINAL HYSTERECTOMY    . CHOLECYSTECTOMY       OB History   None      Home Medications    Prior to Admission medications   Medication Sig Start Date End Date Taking? Authorizing Provider  benzonatate (TESSALON) 100 MG capsule Take 1 capsule (100 mg total) by mouth 3 (three) times daily as needed for cough. Patient not taking: Reported on 02/22/2018 02/15/18   Ward, Chase Picket, PA-C    Family  History Family History  Problem Relation Age of Onset  . Other Mother        no health conditions per patient  . Other Father        no health conditions per patient    Social History Social History   Tobacco Use  . Smoking status: Never Smoker  . Smokeless tobacco: Never Used  Substance Use Topics  . Alcohol use: No  . Drug use: No     Allergies   Lidocaine   Review of Systems Review of Systems  Musculoskeletal: Positive for back pain.  Neurological: Positive for dizziness.  Psychiatric/Behavioral: Positive for confusion.  All other systems reviewed and are negative.    Physical Exam Updated Vital Signs BP (!) 144/104 (BP Location: Left Arm)   Pulse 73   Temp 98.1 F (36.7 C) (Oral)   Resp 17   SpO2 98%   Physical Exam  Constitutional: She is oriented to person, place, and time. She appears well-developed.  Tangential   HENT:  Head: Normocephalic.  Mouth/Throat: Oropharynx is clear and moist.  Eyes: Pupils are equal, round, and reactive to light. Conjunctivae and EOM are normal.  Neck: Normal range of motion. Neck supple.  Cardiovascular: Normal rate, regular rhythm and normal heart sounds.  Pulmonary/Chest: Effort normal and breath sounds normal. No stridor. No respiratory distress. She has no wheezes.  Abdominal: Soft. Bowel sounds  are normal. She exhibits no distension.  Minimal epigastric tenderness, no rebound   Musculoskeletal: Normal range of motion.  Mild upper thoracic tenderness, mild lower lumbar tenderness, no deformity. No saddle anesthesia   Neurological: She is alert and oriented to person, place, and time. No cranial nerve deficit. Coordination normal.  Skin: Skin is warm.  Psychiatric:  Tangential, paranoid, depressed   Nursing note and vitals reviewed.    ED Treatments / Results  Labs (all labs ordered are listed, but only abnormal results are displayed) Labs Reviewed  COMPREHENSIVE METABOLIC PANEL - Abnormal; Notable for the  following components:      Result Value   Potassium 3.3 (*)    All other components within normal limits  ACETAMINOPHEN LEVEL - Abnormal; Notable for the following components:   Acetaminophen (Tylenol), Serum <10 (*)    All other components within normal limits  CBC WITH DIFFERENTIAL/PLATELET  ETHANOL  SALICYLATE LEVEL  RAPID URINE DRUG SCREEN, HOSP PERFORMED  BRAIN NATRIURETIC PEPTIDE  URINALYSIS, ROUTINE W REFLEX MICROSCOPIC  I-STAT TROPONIN, ED    EKG None  Radiology Dg Chest 2 View  Result Date: 02/22/2018 CLINICAL DATA:  Cough and congestion for 1 week with new onset upper back pain this morning. EXAM: CHEST - 2 VIEW COMPARISON:  02/20/2017 FINDINGS: Stable mild vascular indistinctness. The central airways do not appear particularly thickened currently. Mild enlargement of the cardiopericardial silhouette. No pleural effusion. Small retrocardiac density on the lateral projection corresponds to previously demonstrated hiatal hernia. Mid and lower thoracic spondylosis. Chronic appearing superior endplate compression at L4, not changed in contour from 08/11/2017. IMPRESSION: 1. Mild enlargement of the cardiopericardial silhouette with some indistinctness of pulmonary vasculature which could reflect mild pulmonary venous hypertension. No overt edema. 2. Mid and lower thoracic spondylosis. 3. Remote superior endplate compression at L4. 4. Small hiatal hernia. Electronically Signed   By: Gaylyn Rong M.D.   On: 02/22/2018 08:22   Dg Chest 2 View  Result Date: 02/20/2018 CLINICAL DATA:  Cough and congestion for over a week EXAM: CHEST - 2 VIEW COMPARISON:  02/15/2018 FINDINGS: The heart size and mediastinal contours are within normal limits. No pneumonic consolidation or CHF. Mild bronchitic change with peribronchial thickening and increased bilateral perihilar interstitial lung markings noted. The visualized skeletal structures are unremarkable. IMPRESSION: No active cardiopulmonary  disease.  Stable mild bronchitic change. Electronically Signed   By: Tollie Eth M.D.   On: 02/20/2018 23:13   Ct Head Wo Contrast  Result Date: 02/22/2018 CLINICAL DATA:  Pt complains of upper back pain that woke her up this am, pt is not making sense when asking her questions Pt. C/o neck pain EXAM: CT HEAD WITHOUT CONTRAST CT CERVICAL SPINE WITHOUT CONTRAST TECHNIQUE: Multidetector CT imaging of the head and cervical spine was performed following the standard protocol without intravenous contrast. Multiplanar CT image reconstructions of the cervical spine were also generated. COMPARISON:  None. FINDINGS: CT HEAD FINDINGS Brain: Small hyperdense focus within the RIGHT basal ganglia, located at the level of the globus pallidus, is compatible with chronic benign calcification. All other areas of the brain demonstrate normal gray-white matter attenuation. There is no mass, hemorrhage, edema or other evidence of acute parenchymal abnormality. No extra-axial hemorrhage. No hydrocephalus. Vascular: No suspicious or unexpected calcification. Skull: Normal. Negative for fracture or focal lesion. Sinuses/Orbits: No acute finding. Other: None. CT CERVICAL SPINE FINDINGS Alignment: Mild levoscoliosis. No evidence of acute vertebral body subluxation. Skull base and vertebrae: No fracture line or displaced fracture  fragment. No acute appearing cortical irregularity or osseous lesion. Facet joints appear intact and normally aligned throughout. Soft tissues and spinal canal: No prevertebral fluid or swelling. No visible canal hematoma. Disc levels: Mild disc desiccations at multiple levels. No significant central canal stenosis at any level. Advanced degenerative hypertrophy of the uncovertebral and facet joints at C4-5, causing moderate to severe RIGHT neural foramen stenosis with possible associated nerve impingement. Upper chest: Unremarkable. Other: None. IMPRESSION: 1. No acute intracranial abnormality. No intracranial  mass, hemorrhage or edema. 2. No fracture or acute subluxation within the cervical spine. Degenerative changes of the cervical spine, as detailed above. These results were called by telephone at the time of interpretation on 02/22/2018 at 10:30 am to Dr. Chaney Malling , who verbally acknowledged these results. Electronically Signed   By: Bary Richard M.D.   On: 02/22/2018 10:33   Ct Angio Chest Pe W And/or Wo Contrast  Result Date: 02/22/2018 CLINICAL DATA:  Upper back pain that awoke the patient from sleep. Abdominal distention. EXAM: CT ANGIOGRAPHY CHEST CT ABDOMEN AND PELVIS WITH CONTRAST TECHNIQUE: Multidetector CT imaging of the chest was performed using the standard protocol during bolus administration of intravenous contrast. Multiplanar CT image reconstructions and MIPs were obtained to evaluate the vascular anatomy. Multidetector CT imaging of the abdomen and pelvis was performed using the standard protocol during bolus administration of intravenous contrast. CONTRAST:  ISOVUE-370 IOPAMIDOL (ISOVUE-370) INJECTION 76% COMPARISON:  Chest CT 02/28/2016.  Abdominal CT 03/02/2017 FINDINGS: CTA CHEST FINDINGS Cardiovascular: Satisfactory opacification of the pulmonary arteries to the segmental level. No evidence of pulmonary embolism. Normal heart size. No pericardial effusion. Mediastinum/Nodes: Negative for adenopathy or mass. Small to moderate sliding hiatal hernia. Lungs/Pleura: The central airways are clear with borderline wall thickening similar to prior. There is no edema, consolidation, effusion, or pneumothorax. Incidental tracheal diverticulum at the right thoracic inlet, uncomplicated. Mild dependent atelectasis. Musculoskeletal: Thoracic spine described on dedicated exam. No acute or aggressive finding seen throughout the visualized skeleton. Review of the MIP images confirms the above findings. CT ABDOMEN and PELVIS FINDINGS Hepatobiliary: No focal liver abnormality.Cholecystectomy. Negative  common bile duct. Pancreas: Unremarkable. Spleen: Unremarkable. Adrenals/Urinary Tract: Negative adrenals. No hydronephrosis or stone. Unremarkable bladder. Stomach/Bowel:  No obstruction. Extensive sigmoid diverticulosis. Vascular/Lymphatic: No acute vascular abnormality. No mass or adenopathy. Reproductive:No pathologic findings. Other: No ascites or pneumoperitoneum. Musculoskeletal: Lumbar spine reformats described separately. No acute or aggressive finding seen throughout the scan. Review of the MIP images confirms the above findings. IMPRESSION: Chest CTA: 1. Negative for pulmonary embolism or other acute finding. 2. Small to moderate hiatal hernia. Abdominal CT: 1. No explanation for symptoms. 2. Sigmoid diverticulosis. Electronically Signed   By: Marnee Spring M.D.   On: 02/22/2018 11:06   Ct Cervical Spine Wo Contrast  Result Date: 02/22/2018 CLINICAL DATA:  Pt complains of upper back pain that woke her up this am, pt is not making sense when asking her questions Pt. C/o neck pain EXAM: CT HEAD WITHOUT CONTRAST CT CERVICAL SPINE WITHOUT CONTRAST TECHNIQUE: Multidetector CT imaging of the head and cervical spine was performed following the standard protocol without intravenous contrast. Multiplanar CT image reconstructions of the cervical spine were also generated. COMPARISON:  None. FINDINGS: CT HEAD FINDINGS Brain: Small hyperdense focus within the RIGHT basal ganglia, located at the level of the globus pallidus, is compatible with chronic benign calcification. All other areas of the brain demonstrate normal gray-white matter attenuation. There is no mass, hemorrhage, edema or other evidence  of acute parenchymal abnormality. No extra-axial hemorrhage. No hydrocephalus. Vascular: No suspicious or unexpected calcification. Skull: Normal. Negative for fracture or focal lesion. Sinuses/Orbits: No acute finding. Other: None. CT CERVICAL SPINE FINDINGS Alignment: Mild levoscoliosis. No evidence of acute  vertebral body subluxation. Skull base and vertebrae: No fracture line or displaced fracture fragment. No acute appearing cortical irregularity or osseous lesion. Facet joints appear intact and normally aligned throughout. Soft tissues and spinal canal: No prevertebral fluid or swelling. No visible canal hematoma. Disc levels: Mild disc desiccations at multiple levels. No significant central canal stenosis at any level. Advanced degenerative hypertrophy of the uncovertebral and facet joints at C4-5, causing moderate to severe RIGHT neural foramen stenosis with possible associated nerve impingement. Upper chest: Unremarkable. Other: None. IMPRESSION: 1. No acute intracranial abnormality. No intracranial mass, hemorrhage or edema. 2. No fracture or acute subluxation within the cervical spine. Degenerative changes of the cervical spine, as detailed above. These results were called by telephone at the time of interpretation on 02/22/2018 at 10:30 am to Dr. Chaney MallingAVID Carleigh Buccieri , who verbally acknowledged these results. Electronically Signed   By: Bary RichardStan  Maynard M.D.   On: 02/22/2018 10:33   Ct Abdomen Pelvis W Contrast  Result Date: 02/22/2018 CLINICAL DATA:  Upper back pain that awoke the patient from sleep. Abdominal distention. EXAM: CT ANGIOGRAPHY CHEST CT ABDOMEN AND PELVIS WITH CONTRAST TECHNIQUE: Multidetector CT imaging of the chest was performed using the standard protocol during bolus administration of intravenous contrast. Multiplanar CT image reconstructions and MIPs were obtained to evaluate the vascular anatomy. Multidetector CT imaging of the abdomen and pelvis was performed using the standard protocol during bolus administration of intravenous contrast. CONTRAST:  100mL ISOVUE-370 IOPAMIDOL (ISOVUE-370) INJECTION 76% COMPARISON:  Chest CT 02/28/2016.  Abdominal CT 03/02/2017 FINDINGS: CTA CHEST FINDINGS Cardiovascular: Satisfactory opacification of the pulmonary arteries to the segmental level. No evidence of  pulmonary embolism. Normal heart size. No pericardial effusion. Mediastinum/Nodes: Negative for adenopathy or mass. Small to moderate sliding hiatal hernia. Lungs/Pleura: The central airways are clear with borderline wall thickening similar to prior. There is no edema, consolidation, effusion, or pneumothorax. Incidental tracheal diverticulum at the right thoracic inlet, uncomplicated. Mild dependent atelectasis. Musculoskeletal: Thoracic spine described on dedicated exam. No acute or aggressive finding seen throughout the visualized skeleton. Review of the MIP images confirms the above findings. CT ABDOMEN and PELVIS FINDINGS Hepatobiliary: No focal liver abnormality.Cholecystectomy. Negative common bile duct. Pancreas: Unremarkable. Spleen: Unremarkable. Adrenals/Urinary Tract: Negative adrenals. No hydronephrosis or stone. Unremarkable bladder. Stomach/Bowel:  No obstruction. Extensive sigmoid diverticulosis. Vascular/Lymphatic: No acute vascular abnormality. No mass or adenopathy. Reproductive:No pathologic findings. Other: No ascites or pneumoperitoneum. Musculoskeletal: Lumbar spine reformats described separately. No acute or aggressive finding seen throughout the scan. Review of the MIP images confirms the above findings. IMPRESSION: Chest CTA: 1. Negative for pulmonary embolism or other acute finding. 2. Small to moderate hiatal hernia. Abdominal CT: 1. No explanation for symptoms. 2. Sigmoid diverticulosis. Electronically Signed   By: Marnee SpringJonathon  Watts M.D.   On: 02/22/2018 11:06   Ct L-spine No Charge  Result Date: 02/22/2018 CLINICAL DATA:  Upper back pain that awoke the patient from sleep. EXAM: CT Thoracic and lumbar spine without contrast TECHNIQUE: Multiplanar CT images of the thoracic and lumbar spine were reconstructed from contemporary CTA of the Chest and abdomen. CONTRAST:  None additional COMPARISON:  Abdominal CT 03/02/2017.  Chest CT 02/28/2016 FINDINGS: Thoracic: Alignment: Exaggerated  thoracic kyphosis.  No listhesis. Vertebrae: There is no visible fracture,  discitis, or aggressive bone lesion. Paraspinal and other soft tissues: No visible inflammation or mass. Disc levels: Spondylosis with bridging osteophytes from T7-T10. Degenerative facet spurring bilaterally in the upper thoracic spine, with associated foraminal narrowing most notable on the left at T3-4. T10-11 small central disc protrusion. There is no visible cord impingement Lumbar: Segmentation: 5 lumbar type vertebral bodies. Alignment: Chronic grade 1 anterolisthesis at L4-5, facet mediated. Vertebrae: Remote L4 superior endplate fracture and Schmorl's node, interval compared to 2018 abdominal CT. No evidence of acute fracture, discitis, or aggressive bone lesion. Paraspinal and other soft tissues: Reported separately. Disc levels: T12- L1: Unremarkable. L1-L2: Unremarkable. L2-L3: Interspinous vacuum phenomenon compatible with degenerative bursa. There is associated spinous process sclerosis and irregularity. Degenerative facet spurring. No visible impingement L3-L4: Degenerative facet spurring and interspinous degenerative change. Disc narrowing and bulging. Mild to moderate foraminal narrowing and spinal stenosis. L4-L5: Advanced facet arthropathy with anterolisthesis. The disc is bulging. Mild bilateral foraminal narrowing. Moderate range spinal stenosis. L5-S1:Disc bulging without visible impingement IMPRESSION: Thoracic spine: 1. No acute finding. 2. Spondylosis and upper thoracic degenerative facet spurring as described above. Lumbar spine: 1. No acute finding. 2. L4 superior endplate fracture that is chronic but interval from April 2018. 3. Facet degeneration most notable at L2-3 to L4-5, with chronic L4-5 grade 1 anterolisthesis. 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 4. L4-5 moderate range spinal stenosis. Electronically Signed   By: Marnee Spring M.D.   On: 02/22/2018 10:54   Ct 3d Recon At  Scanner  Result Date: 02/22/2018 CLINICAL DATA:  Upper back pain that awoke the patient from sleep. EXAM: CT Thoracic and lumbar spine without contrast TECHNIQUE: Multiplanar CT images of the thoracic and lumbar spine were reconstructed from contemporary CTA of the Chest and abdomen. CONTRAST:  None additional COMPARISON:  Abdominal CT 03/02/2017.  Chest CT 02/28/2016 FINDINGS: Thoracic: Alignment: Exaggerated thoracic kyphosis.  No listhesis. Vertebrae: There is no visible fracture, discitis, or aggressive bone lesion. Paraspinal and other soft tissues: No visible inflammation or mass. Disc levels: Spondylosis with bridging osteophytes from T7-T10. Degenerative facet spurring bilaterally in the upper thoracic spine, with associated foraminal narrowing most notable on the left at T3-4. T10-11 small central disc protrusion. There is no visible cord impingement Lumbar: Segmentation: 5 lumbar type vertebral bodies. Alignment: Chronic grade 1 anterolisthesis at L4-5, facet mediated. Vertebrae: Remote L4 superior endplate fracture and Schmorl's node, interval compared to 2018 abdominal CT. No evidence of acute fracture, discitis, or aggressive bone lesion. Paraspinal and other soft tissues: Reported separately. Disc levels: T12- L1: Unremarkable. L1-L2: Unremarkable. L2-L3: Interspinous vacuum phenomenon compatible with degenerative bursa. There is associated spinous process sclerosis and irregularity. Degenerative facet spurring. No visible impingement L3-L4: Degenerative facet spurring and interspinous degenerative change. Disc narrowing and bulging. Mild to moderate foraminal narrowing and spinal stenosis. L4-L5: Advanced facet arthropathy with anterolisthesis. The disc is bulging. Mild bilateral foraminal narrowing. Moderate range spinal stenosis. L5-S1:Disc bulging without visible impingement IMPRESSION: Thoracic spine: 1. No acute finding. 2. Spondylosis and upper thoracic degenerative facet spurring as described  above. Lumbar spine: 1. No acute finding. 2. L4 superior endplate fracture that is chronic but interval from April 2018. 3. Facet degeneration most notable at L2-3 to L4-5, with chronic L4-5 grade 1 anterolisthesis. 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 4. L4-5 moderate range spinal stenosis. Electronically Signed   By: Marnee Spring M.D.   On: 02/22/2018 10:54    Procedures Procedures (including critical care time)  Medications Ordered in ED Medications  LORazepam (ATIVAN) tablet 1 mg (has no administration in time range)  acetaminophen (TYLENOL) tablet 650 mg (has no administration in time range)  ibuprofen (ADVIL,MOTRIN) tablet 800 mg (has no administration in time range)  sodium chloride 0.9 % injection (  Given by Other 02/22/18 1035)  iopamidol (ISOVUE-370) 76 % injection 100 mL (100 mLs Intravenous Contrast Given 02/22/18 0945)     Initial Impression / Assessment and Plan / ED Course  I have reviewed the triage vital signs and the nursing notes.  Pertinent labs & imaging results that were available during my care of the patient were reviewed by me and considered in my medical decision making (see chart for details).     Rayneisha Bouza is a 62 y.o. female here with back pain, tangential speech, paranoia. Back pain and cough seemed chronic and she has nonfocal neuro exam. She is very tangential and paranoid and thinks that everyone is against her. She is also not taking her psych meds. Will get CT head, CXR, labs, tox, UA.   12: 50 PM CT head unremarkable. Initial CXR showed enlarged pericardial silhouette, possible L 4 fracture. CTA showed no pericardial effusion or PE. CT thoracic and lumbar showed old fractures. She is neurovascularly intact so doesn't need MRI. Medically cleared for psych eval.   3:02 PM  Patient seen by psych and was recommended for inpatient psych admission. Will transfer to TCU for holding.     Final Clinical Impressions(s) / ED  Diagnoses   Final diagnoses:  Back pain    ED Discharge Orders    None       Charlynne Pander, MD 02/22/18 1504

## 2018-03-03 ENCOUNTER — Encounter (HOSPITAL_COMMUNITY): Payer: Self-pay | Admitting: Emergency Medicine

## 2018-03-03 ENCOUNTER — Emergency Department (HOSPITAL_COMMUNITY)
Admission: EM | Admit: 2018-03-03 | Discharge: 2018-03-03 | Disposition: A | Discharge status: Home / Self Care | Payer: Medicaid Other | Attending: Emergency Medicine | Admitting: Emergency Medicine

## 2018-03-03 DIAGNOSIS — M545 Low back pain: Secondary | ICD-10-CM | POA: Diagnosis not present

## 2018-03-03 DIAGNOSIS — G8929 Other chronic pain: Secondary | ICD-10-CM

## 2018-03-03 DIAGNOSIS — M25561 Pain in right knee: Secondary | ICD-10-CM | POA: Diagnosis not present

## 2018-03-03 MED ORDER — ACETAMINOPHEN 500 MG PO TABS: 500.0000 mg | ORAL_TABLET | Freq: Four times a day (QID) | ORAL | 0 refills | Status: DC | PRN

## 2018-03-03 MED ORDER — METHOCARBAMOL 500 MG PO TABS: 500.0000 mg | ORAL_TABLET | Freq: Two times a day (BID) | ORAL | 0 refills | Status: DC

## 2018-03-03 NOTE — Discharge Instructions (Addendum)
Tylenol for pain. Robaxin for spasms. Please follow up with your doctor.

## 2018-03-03 NOTE — ED Provider Notes (Signed)
[facility]-EMERGENCY DEPT Provider Note   CSN: [id number] Arrival date & time: 03/03/18  1406     History   Chief Complaint Chief Complaint  Patient presents with  . Back Pain  . Leg Pain    HPI Felicia Collier is a 62 y.o. female.  HPI  Felicia Collier is a 62 y.o. female presents to emergency department complaining of back pain, right knee pain, left wrist pain.  Patient has diagnoses of chronic back chronic knee pain, as well as schizophrenia.  She states that she is currently taking ibuprofen for her pain.  She states she is wearing a knee sleeve which is helping.  She denies any injuries.  States her back hurts "all over" and states it is "burning."  She states "I just have a lot of heat in my body."  She also states "I have some animals living in my back and I think that biting me with their teeth."  She states that she is having trouble walking up and down stairs because she feels sore all over.  She states that she has a cane but has not been using it.  She has an appointment with her primary care doctor in 10 days.  She states she is here because "I want to know what is going on and why I am hurting everywhere." Denies numbness or weakness in extremities. No fever. No IV drug use. No urinary symptoms.    Past Medical History:  Diagnosis Date  . Chronic back pain   . Chronic knee pain   . Schizophrenia [facility])     Patient Active Problem List   Diagnosis Date Noted  . Pain in left wrist 07/20/2017  . Schizophrenia (HCC) 05/01/2017  . Chronic pain of left knee 02/09/2017  . Unilateral primary osteoarthritis, left knee 02/09/2017  . Neuropathy 11/22/2016  . Gastroesophageal reflux disease without esophagitis 11/22/2016  . Weight gain 11/22/2016  . Atypical chest pain 11/04/2016    Past Surgical History:  Procedure Laterality Date  . ABDOMINAL HYSTERECTOMY    . CHOLECYSTECTOMY       OB History   None      Home Medications    Prior to Admission  medications   Medication Sig Start Date End Date Taking? Authorizing Provider  benzonatate (TESSALON) 100 MG capsule Take 1 capsule (100 mg total) by mouth 3 (three) times daily as needed for cough. Patient not taking: Reported on 02/22/2018 02/15/18   Ward, Chase Picket, PA-C    Family History Family History  Problem Relation Age of Onset  . Other Mother        no health conditions per patient  . Other Father        no health conditions per patient    Social History Social History   Tobacco Use  . Smoking status: Never Smoker  . Smokeless tobacco: Never Used  Substance Use Topics  . Alcohol use: No  . Drug use: No     Allergies   Lidocaine   Review of Systems Review of Systems  Constitutional: Negative for chills and fever.  Respiratory: Negative for cough, chest tightness and shortness of breath.   Cardiovascular: Negative for chest pain, palpitations and leg swelling.  Gastrointestinal: Negative for abdominal pain, diarrhea, nausea and vomiting.  Genitourinary: Negative for dysuria, flank pain and pelvic pain.  Musculoskeletal: Positive for arthralgias and back pain. Negative for myalgias, neck pain and neck stiffness.  Skin: Negative for rash.  Neurological: Negative for dizziness,  weakness, numbness and headaches.  All other systems reviewed and are negative.    Physical Exam Updated Vital Signs BP 117/78 (BP Location: Left Arm)   Pulse 96   Temp 98.7 F (37.1 C) (Oral)   Resp 18   SpO2 97%   Physical Exam  Constitutional: She is oriented to person, place, and time. She appears well-developed and well-nourished. No distress.  HENT:  Head: Normocephalic.  Eyes: Conjunctivae are normal.  Neck: Neck supple.  Cardiovascular: Normal rate, regular rhythm and normal heart sounds.  Pulmonary/Chest: Effort normal and breath sounds normal. No respiratory distress. She has no wheezes. She has no rales.  Abdominal: Soft. Bowel sounds are normal. She exhibits no  distension. There is no tenderness. There is no rebound.  Musculoskeletal: She exhibits no edema.  No midline thoracic or lumbar spine tenderness. TTP diffusely over upper and lower muscles of the back. Full ROM of bilateral upper and lower extremities.  Normal-appearing right knee.  Full range of motion.  Negative anterior and posterior drawer signs.  No laxity with medial lateral stress.  DP pulses intact and equal bilaterally.  Neurological: She is alert and oriented to person, place, and time.  5/5 and equal lower extremity strength. 2+ and equal patellar reflexes bilaterally. Pt able to dorsiflex bilateral toes and feet with good strength against resistance. Equal sensation bilaterally over thighs and lower legs.   Skin: Skin is warm and dry.  Psychiatric: She has a normal mood and affect. Her behavior is normal.  Nursing note and vitals reviewed.    ED Treatments / Results  Labs (all labs ordered are listed, but only abnormal results are displayed) Labs Reviewed - No data to display  EKG None  Radiology No results found.  Procedures Procedures (including critical care time)  Medications Ordered in ED Medications - No data to display   Initial Impression / Assessment and Plan / ED Course  I have reviewed the triage vital signs and the nursing notes.  Pertinent labs & imaging results that were available during my care of the patient were reviewed by me and considered in my medical decision making (see chart for details).     Patient with chronic back pain and chronic knee pain.  No signs of infection based on exam and history.  Afebrile.  Vital signs are normal.  Symptoms appear to be chronic in nature.  No acute injuries.  I do not think she needs any further evaluation.  Patient is somewhat delusional, thinking that there is animals sitting in her back, she has history of the same.  She is however reasonable.  She is alert and oriented.  I do not think she is harmful to  herself or anybody else.  I will add Robaxin to her medications, will continue on ibuprofen and Tylenol for pain, and follow-up with primary care doctor.  Vitals:   03/03/18 1434  BP: 117/78  Pulse: 96  Resp: 18  Temp: 98.7 F (37.1 C)  TempSrc: Oral  SpO2: 97%     Final Clinical Impressions(s) / ED Diagnoses   Final diagnoses:  Chronic bilateral low back pain without sciatica  Chronic pain of right knee    ED Discharge Orders        Ordered    methocarbamol (ROBAXIN) 500 MG tablet  2 times daily     03/03/18 1557    acetaminophen (TYLENOL) 500 MG tablet  Every 6 hours PRN     03/03/18 1557  Jaynie Crumble, PA-C 03/03/18 1558    Shaune Pollack, MD 03/04/18 [phone]

## 2018-03-03 NOTE — ED Triage Notes (Signed)
Pt c.o back pain and right leg pains that has been on going. Pt has knee sleeve on right knee at this time.

## 2018-03-13 ENCOUNTER — Ambulatory Visit: Payer: Medicaid Other | Admitting: Family Medicine

## 2018-03-15 IMAGING — CR DG HIP (WITH OR WITHOUT PELVIS) 2-3V*L*
3 series · 3 of 3 positions shown · non-contrast
Comparison: 04/10/2017.

CLINICAL DATA: 61-year-old who had an intra-articular steroid
injection into the left hip 1 week ago, presenting with worsening
pain.

EXAM:
DG HIP (WITH OR WITHOUT PELVIS) 2-3V LEFT

[pelvis ap]
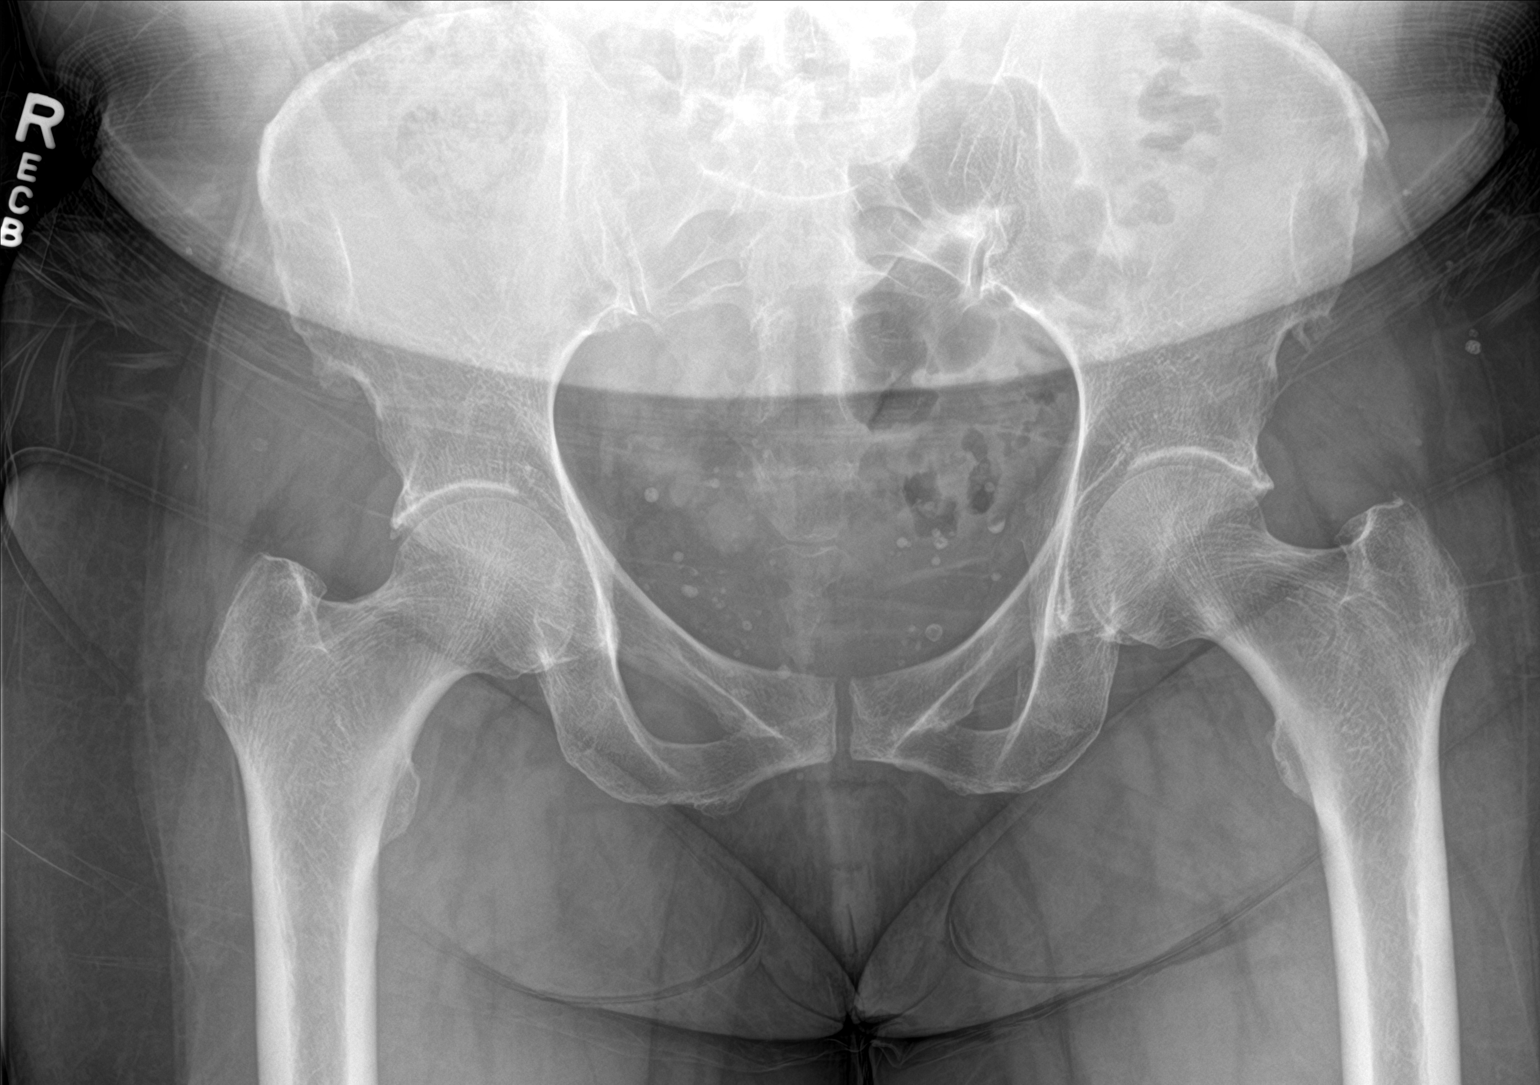

[hip ap]
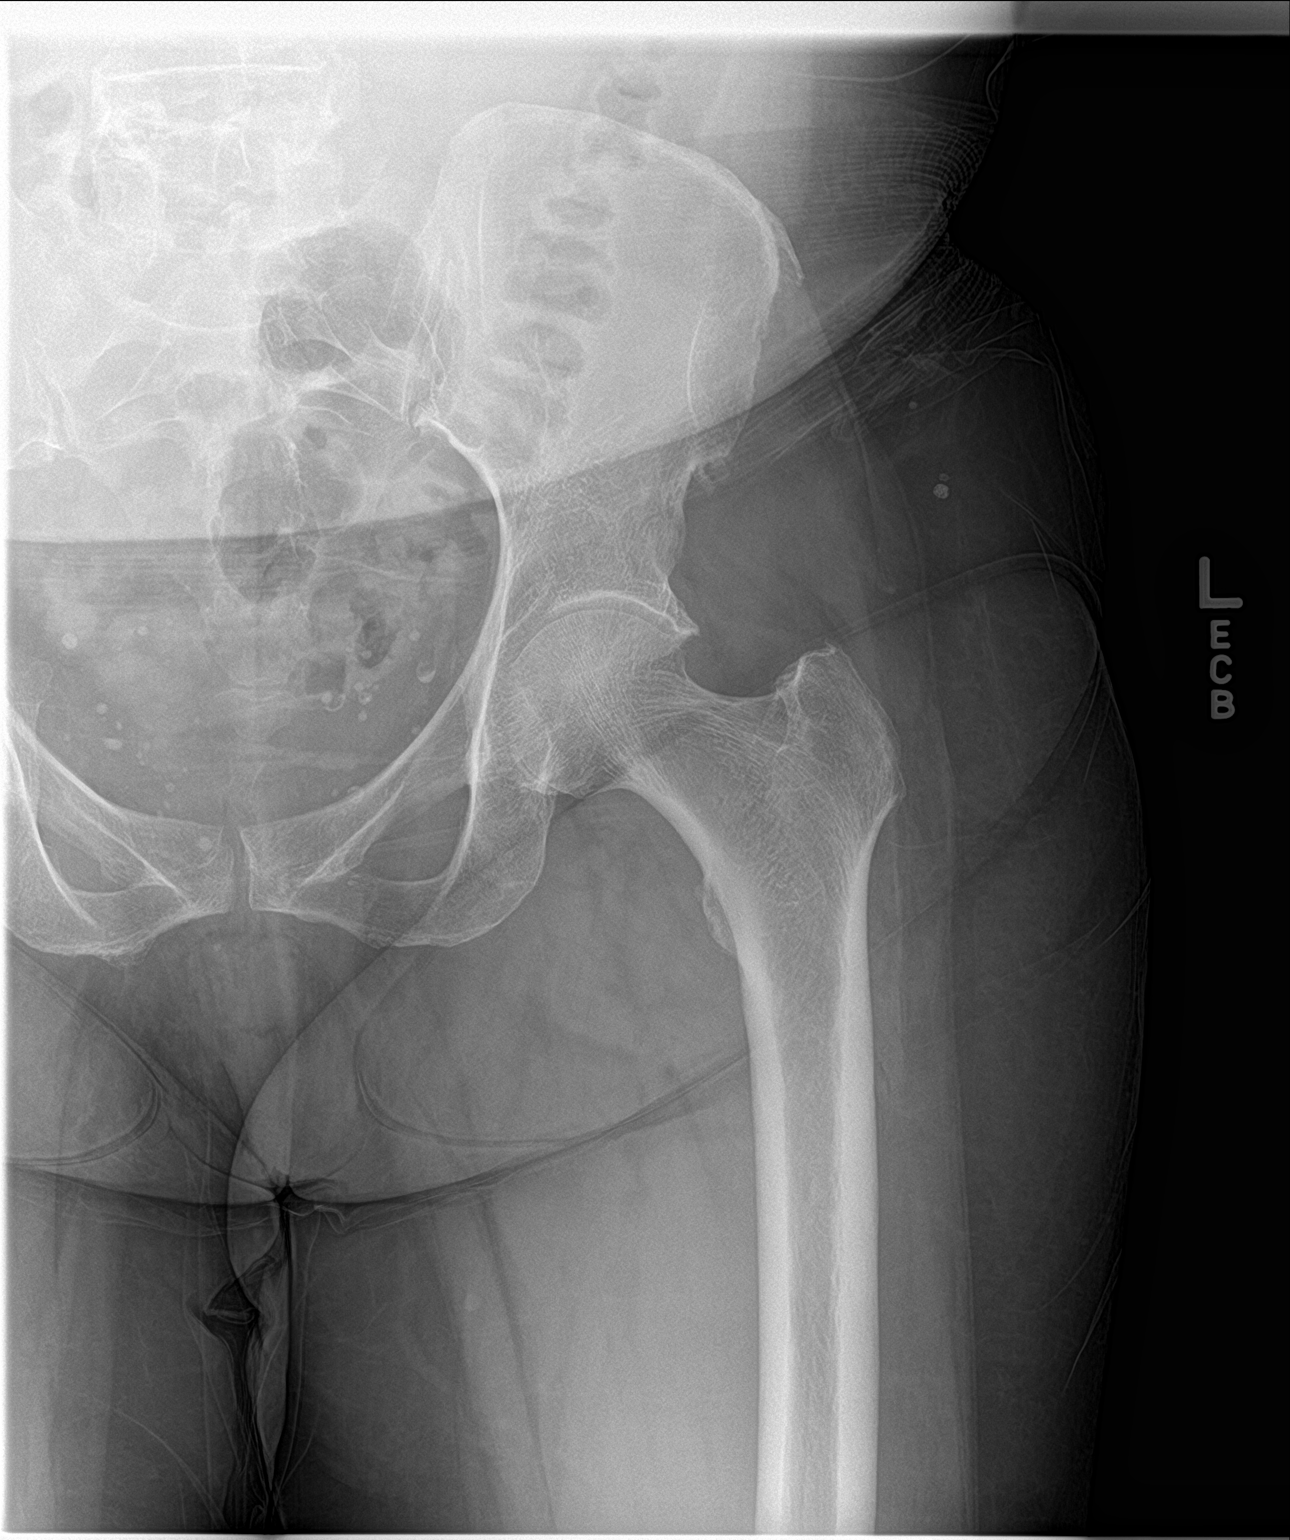

[hip lat]
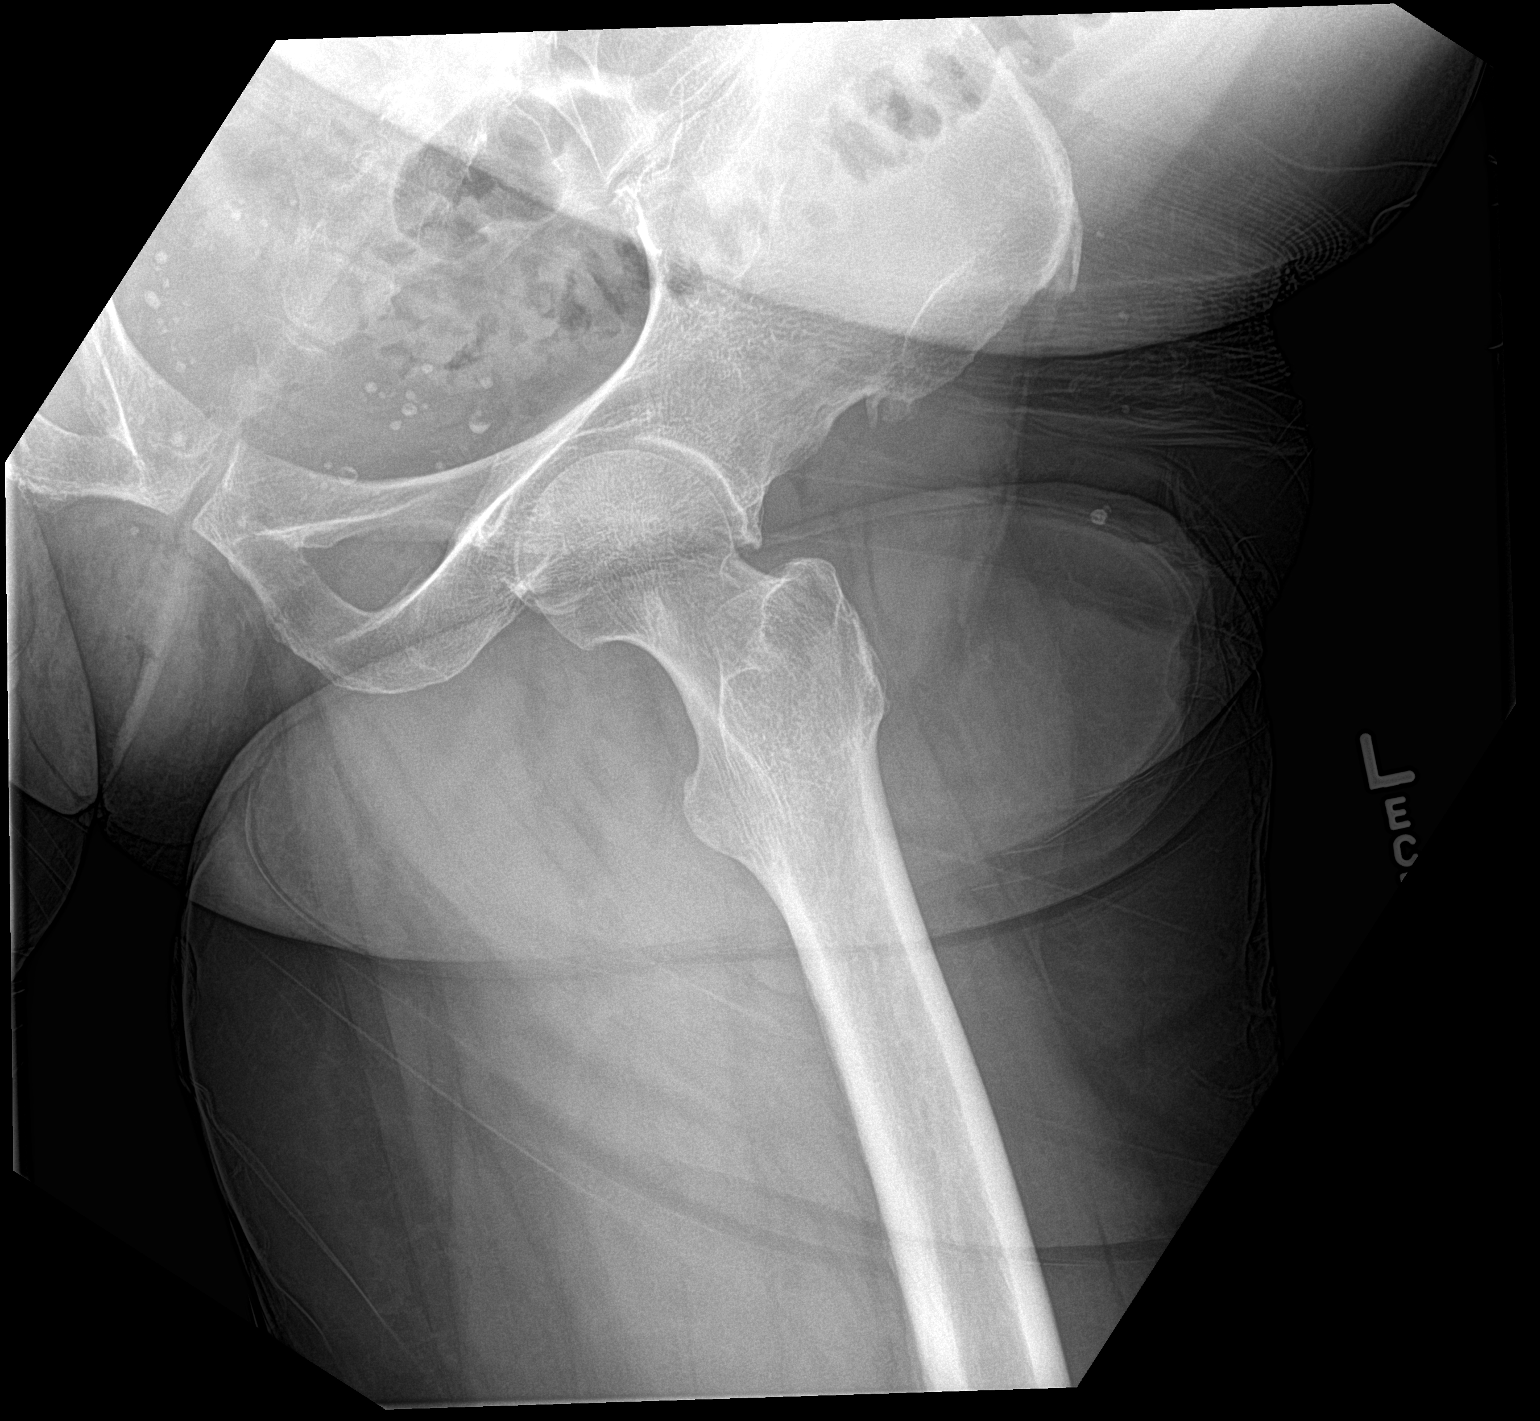

[3 of 3 positions shown; findings below may reference images not displayed]

FINDINGS: No evidence of acute, subacute or healed fractures. Moderate axial
joint space narrowing. Well-preserved bone mineral density.

Included AP pelvis demonstrates symmetric moderate axial joint space
narrowing in the contralateral right hip. Sacroiliac joints and
symphysis pubis intact.
IMPRESSION: Moderate osteoarthritis.  No acute osseous abnormality.

## 2018-03-23 ENCOUNTER — Emergency Department (HOSPITAL_COMMUNITY)
Admission: EM | Admit: 2018-03-23 | Discharge: 2018-03-23 | Disposition: A | Discharge status: Home / Self Care | Payer: Medicaid Other | Attending: Emergency Medicine | Admitting: Emergency Medicine

## 2018-03-23 ENCOUNTER — Encounter (HOSPITAL_COMMUNITY): Payer: Self-pay | Admitting: Emergency Medicine

## 2018-03-23 ENCOUNTER — Other Ambulatory Visit: Payer: Self-pay

## 2018-03-23 DIAGNOSIS — M25569 Pain in unspecified knee: Secondary | ICD-10-CM | POA: Diagnosis not present

## 2018-03-23 DIAGNOSIS — Z5321 Procedure and treatment not carried out due to patient leaving prior to being seen by health care provider: Secondary | ICD-10-CM | POA: Insufficient documentation

## 2018-03-23 NOTE — ED Notes (Signed)
No answer when called for rooming and not seen in lobby.

## 2018-03-23 NOTE — ED Notes (Signed)
Pt called for rooming and no answer.

## 2018-03-23 NOTE — ED Triage Notes (Signed)
Pt reports knee pain with burning that has been ongoing for the last month.

## 2018-03-25 ENCOUNTER — Encounter (HOSPITAL_COMMUNITY): Payer: Self-pay

## 2018-03-25 ENCOUNTER — Other Ambulatory Visit: Payer: Self-pay

## 2018-03-25 ENCOUNTER — Emergency Department (HOSPITAL_COMMUNITY)
Admission: EM | Admit: 2018-03-25 | Discharge: 2018-03-25 | Disposition: A | Discharge status: Home / Self Care | Payer: Medicaid Other | Attending: Emergency Medicine | Admitting: Emergency Medicine

## 2018-03-25 DIAGNOSIS — M25512 Pain in left shoulder: Secondary | ICD-10-CM | POA: Insufficient documentation

## 2018-03-25 DIAGNOSIS — M549 Dorsalgia, unspecified: Secondary | ICD-10-CM | POA: Diagnosis not present

## 2018-03-25 DIAGNOSIS — Z5321 Procedure and treatment not carried out due to patient leaving prior to being seen by health care provider: Secondary | ICD-10-CM | POA: Diagnosis not present

## 2018-03-25 NOTE — ED Notes (Signed)
Bed: WA01 Expected date:  Expected time:  Means of arrival:  Comments: 

## 2018-03-25 NOTE — ED Triage Notes (Addendum)
Pt states back and shoulder pain that has been burning. Pt states pain is on the left side. Pt states that it has been going on for a few days but is worse today. No trauma/injury

## 2018-03-29 ENCOUNTER — Encounter: Payer: Self-pay | Admitting: Family Medicine

## 2018-03-29 ENCOUNTER — Ambulatory Visit (INDEPENDENT_AMBULATORY_CARE_PROVIDER_SITE_OTHER): Payer: Medicaid Other | Admitting: Family Medicine

## 2018-03-29 VITALS — BP 149/73 | HR 54 | Temp 98.1°F | Resp 16 | Ht 65.0 in | Wt 162.0 lb

## 2018-03-29 DIAGNOSIS — K5790 Diverticulosis of intestine, part unspecified, without perforation or abscess without bleeding: Secondary | ICD-10-CM

## 2018-03-29 DIAGNOSIS — M48061 Spinal stenosis, lumbar region without neurogenic claudication: Secondary | ICD-10-CM

## 2018-03-29 DIAGNOSIS — R8290 Unspecified abnormal findings in urine: Secondary | ICD-10-CM | POA: Diagnosis not present

## 2018-03-29 DIAGNOSIS — R1032 Left lower quadrant pain: Secondary | ICD-10-CM | POA: Diagnosis not present

## 2018-03-29 LAB — POCT URINALYSIS DIPSTICK
BILIRUBIN UA: NEGATIVE
Glucose, UA: NEGATIVE
Ketones, UA: NEGATIVE
Nitrite, UA: NEGATIVE
Protein, UA: NEGATIVE
RBC UA: NEGATIVE
Spec Grav, UA: 1.015 (ref 1.010–1.025)
UROBILINOGEN UA: 1 U/dL
pH, UA: 6 (ref 5.0–8.0)

## 2018-03-29 MED ORDER — GABAPENTIN 100 MG PO CAPS
100.0000 mg | ORAL_CAPSULE | Freq: Two times a day (BID) | ORAL | 3 refills | Status: DC
Start: 2018-03-29 — End: 2018-05-03

## 2018-03-29 MED ORDER — SACCHAROMYCES BOULARDII 250 MG PO CAPS: 250.0000 mg | ORAL_CAPSULE | Freq: Two times a day (BID) | ORAL | 1 refills | Status: DC

## 2018-03-29 NOTE — Progress Notes (Signed)
Subjective:    Patient ID: Felicia Collier, female    DOB: Apr 12, 1956, 4 y.oLateria [id number] Shonna Deiter, a 62 year old female with a history of undifferentiated schizophrenia presents complaining of chronic back and abdominal pain primarily to left upper and lower quadrants.  Patient is not followed by psychiatry consistently for history of schizophrenia.  She currently feels that she does not need medication for schizophrenia and discontinued all prescribed medications.  She says that she was recently admitted to inpatient behavioral health in [facility].  Patient is a poor historian and it was difficult to obtain further details. Patient was evaluated in the emergency department on 03/23/2018 for evaluation of low back pain.  Patient underwent a CT of L-spine which is positive for L4-5 moderate range spinal stenosis.  Also, patient has an L4 superior endplate fracture that is considered chronic.  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  She is not currently followed by orthopedic specialist.  Current pain intensity is 7 out of 10 characterized as intermittent and aching.  She states that back pain is worsened by lying down and prolonged sitting.  Pertinent negatives include no bowel incontinence, headaches, numbness, paresis, paresthesias, pelvic pain, perianal numbness, weakness, or weight loss. Patient is also complaining of abdominal pain for 5 days.  Pain is primarily located in left upper and lower quadrants.  The quality of patient's pain is aching.  Current pain intensity is 4/10.  She has not identified any palliative or provocative factors associated with abdominal pain.  Also she is not attempted any over-the-counter interventions to alleviate symptoms.   Past Medical History:  Diagnosis Date  . Chronic back pain   . Chronic knee pain   . Schizophrenia [facility])    Social  History   Socioeconomic History  . Marital status: Single    Spouse name: Not on file  . Number of children: Not on file  . Years of education: Not on file  . Highest education level: Not on file  Occupational History  . Not on file  Social Needs  . Financial resource strain: Not on file  . Food insecurity:    Worry: Not on file    Inability: Not on file  . Transportation needs:    Medical: Not on file    Non-medical: Not on file  Tobacco Use  . Smoking status: Never Smoker  . Smokeless tobacco: Never Used  Substance and Sexual Activity  . Alcohol use: No  . Drug use: No  . Sexual activity: Not Currently  Lifestyle  . Physical activity:    Days per week: Not on file    Minutes per session: Not on file  . Stress: Not on file  Relationships  . Social connections:    Talks on phone: Not on file    Gets together: Not on file    Attends religious service: Not on file    Active member of club or organization: Not on file    Attends meetings of clubs or organizations: Not on file    Relationship status: Not on file  . Intimate partner violence:    Fear of current or ex partner: Not on file    Emotionally abused: Not on file    Physically abused: Not on file    Forced sexual activity: Not on file  Other Topics Concern  . Not on  file  Social History Narrative  . Not on file    Review of Systems  Constitutional: Negative.  Negative for weight loss.  Respiratory: Negative.   Cardiovascular: Negative.   Gastrointestinal: Positive for abdominal pain. Negative for bowel incontinence.  Genitourinary: Negative for pelvic pain.  Musculoskeletal: Positive for back pain.  Neurological: Negative for weakness, numbness, headaches and paresthesias.  Psychiatric/Behavioral: Negative for agitation, behavioral problems, decreased concentration, hallucinations, self-injury and sleep disturbance. The patient is not nervous/anxious.        Objective:   Physical Exam   Constitutional: She appears well-developed and well-nourished.  HENT:  Head: Normocephalic.  Eyes: Pupils are equal, round, and reactive to light.  Cardiovascular: Normal rate, regular rhythm, normal heart sounds and intact distal pulses.  Pulmonary/Chest: Effort normal and breath sounds normal.  Abdominal: Soft. Normal appearance and bowel sounds are normal. She exhibits no distension. There is tenderness in the left upper quadrant and left lower quadrant.  Musculoskeletal:       Lumbar back: She exhibits pain. She exhibits normal range of motion, no tenderness, no swelling and no spasm.  Psychiatric: She has a normal mood and affect. Her speech is normal. She expresses impulsivity. She expresses no homicidal and no suicidal ideation.       BP (!) 149/73 (BP Location: Left Arm, Patient Position: Sitting, Cuff Size: Normal)   Pulse (!) 54   Temp 98.1 F (36.7 C) (Oral)   Resp 16   Ht  (1.651 m)   Wt 162 lb (73.5 kg)   SpO2 98%   BMI 26.96 kg/m  Assessment & Plan:  1. Left lower quadrant pain - Urinalysis Dipstick - Comprehensive metabolic panel - CBC with Differential  2. Abnormal urinalysis Urine leukocytes, will follow culture - Urine Culture  3. Spinal stenosis at L4-L5 level Reviewed CT L-spine from 02/22/2018, patient warrants referral to orthopedics.  Will start a trial of gabapentin 100 mg three times per day - AMB referral to orthopedics - gabapentin (NEURONTIN) 100 MG capsule; Take 1 capsule (100 mg total) by mouth 2 (two) times daily.  Dispense: 60 capsule; Refill: 3  4. Diverticulosis CT of abdomen and pelvis significant for diverticulosis. Recommend a high fiber diet, will also start a trial of probiotics BID.  - saccharomyces boulardii (FLORASTOR) 250 MG capsule; Take 1 capsule (250 mg total) by mouth 2 (two) times daily.  Dispense: 60 capsule; Refill: 1    The patient was given clear instructions to go to ER or return to medical center if symptoms do  not improve, worsen or new problems develop. The patient verbalized understanding.    RTC: 3 months for chronic conditions   Nolon Nations  MSN, FNP-C [facility] [address] [place], [place] [address] [phone]

## 2018-03-29 NOTE — Patient Instructions (Addendum)
Reviewed CT of lumbar spine, will send a referral to orthopedic specialists We will start a trial of gabapentin 100 mg twice daily for low back pain.  No drinking alcohol, driving, or operating machinery while taking this medication.  For abdominal discomfort, I recommend a high fiber diet. Will also add a probiotic.  Diverticulosis Diverticulosis is a condition that develops when small pouches (diverticula) form in the wall of the large intestine (colon). The colon is where water is absorbed and stool is formed. The pouches form when the inside layer of the colon pushes through weak spots in the outer layers of the colon. You may have a few pouches or many of them. What are the causes? The cause of this condition is not known. What increases the risk? The following factors may make you more likely to develop this condition:  Being older than age 62. Your risk for this condition increases with age. Diverticulosis is rare among people younger than age 106. By age 36, many people have it.  Eating a low-fiber diet.  Having frequent constipation.  Being overweight.  Not getting enough exercise.  Smoking.  Taking over-the-counter pain medicines, like aspirin and ibuprofen.  Having a family history of diverticulosis.  What are the signs or symptoms? In most people, there are no symptoms of this condition. If you do have symptoms, they may include:  Bloating.  Cramps in the abdomen.  Constipation or diarrhea.  Pain in the lower left side of the abdomen.  How is this diagnosed? This condition is most often diagnosed during an exam for other colon problems. Because diverticulosis usually has no symptoms, it often cannot be diagnosed independently. This condition may be diagnosed by:  Using a flexible scope to examine the colon (colonoscopy).  Taking an X-ray of the colon after dye has been put into the colon (barium enema).  Doing a CT scan.  How is this treated? You may not  need treatment for this condition if you have never developed an infection related to diverticulosis. If you have had an infection before, treatment may include:  Eating a high-fiber diet. This may include eating more fruits, vegetables, and grains.  Taking a fiber supplement.  Taking a live bacteria supplement (probiotic).  Taking medicine to relax your colon.  Taking antibiotic medicines.  Follow these instructions at home:  Drink 6-8 glasses of water or more each day to prevent constipation.  Try not to strain when you have a bowel movement.  If you have had an infection before: ? Eat more fiber as directed by your health care provider or your diet and nutrition specialist (dietitian). ? Take a fiber supplement or probiotic, if your health care provider approves.  Take over-the-counter and prescription medicines only as told by your health care provider.  If you were prescribed an antibiotic, take it as told by your health care provider. Do not stop taking the antibiotic even if you start to feel better.  Keep all follow-up visits as told by your health care provider. This is important. Contact a health care provider if:  You have pain in your abdomen.  You have bloating.  You have cramps.  You have not had a bowel movement in 3 days. Get help right away if:  Your pain gets worse.  Your bloating becomes very bad.  You have a fever or chills, and your symptoms suddenly get worse.  You vomit.  You have bowel movements that are bloody or black.  You have bleeding  from your rectum. Summary  Diverticulosis is a condition that develops when small pouches (diverticula) form in the wall of the large intestine (colon).  You may have a few pouches or many of them.  This condition is most often diagnosed during an exam for other colon problems.  If you have had an infection related to diverticulosis, treatment may include increasing the fiber in your diet, taking  supplements, or taking medicines. This information is not intended to replace advice given to you by your health care provider. Make sure you discuss any questions you have with your health care provider. Document Released: 07/29/2004 Document Revised: 09/20/2016 Document Reviewed: 09/20/2016 Elsevier Interactive Patient Education  2017 [employer].

## 2018-03-30 LAB — CBC WITH DIFFERENTIAL/PLATELET
BASOS: 0 %
Basophils Absolute: 0 10*3/uL (ref 0.0–0.2)
EOS (ABSOLUTE): 0.1 10*3/uL (ref 0.0–0.4)
EOS: 2 %
HEMOGLOBIN: 13.2 g/dL (ref 11.1–15.9)
Hematocrit: 42.2 % (ref 34.0–46.6)
IMMATURE GRANS (ABS): 0 10*3/uL (ref 0.0–0.1)
Immature Granulocytes: 0 %
Lymphocytes Absolute: 1.7 10*3/uL (ref 0.7–3.1)
Lymphs: 22 %
MCH: 25.9 pg — AB (ref 26.6–33.0)
MCHC: 31.3 g/dL — ABNORMAL LOW (ref 31.5–35.7)
MCV: 83 fL (ref 79–97)
MONOCYTES: 8 %
Monocytes Absolute: 0.6 10*3/uL (ref 0.1–0.9)
NEUTROS ABS: 5.1 10*3/uL (ref 1.4–7.0)
Neutrophils: 68 %
PLATELETS: 332 10*3/uL (ref 150–379)
RBC: 5.1 x10E6/uL (ref 3.77–5.28)
RDW: 14.3 % (ref 12.3–15.4)
WBC: 7.5 10*3/uL (ref 3.4–10.8)

## 2018-03-30 LAB — COMPREHENSIVE METABOLIC PANEL
A/G RATIO: 1.5 (ref 1.2–2.2)
ALT: 14 IU/L (ref 0–32)
AST: 19 IU/L (ref 0–40)
Albumin: 4.2 g/dL (ref 3.6–4.8)
Alkaline Phosphatase: 109 IU/L (ref 39–117)
BUN/Creatinine Ratio: 11 — ABNORMAL LOW (ref 12–28)
BUN: 9 mg/dL (ref 8–27)
Bilirubin Total: 0.8 mg/dL (ref 0.0–1.2)
CALCIUM: 9.2 mg/dL (ref 8.7–10.3)
CO2: 25 mmol/L (ref 20–29)
Chloride: 102 mmol/L (ref 96–106)
Creatinine, Ser: 0.8 mg/dL (ref 0.57–1.00)
GFR, EST AFRICAN AMERICAN: 92 mL/min/{1.73_m2} (ref >59)
GFR, EST NON AFRICAN AMERICAN: 80 mL/min/{1.73_m2} (ref >59)
GLUCOSE: 81 mg/dL (ref 65–99)
Globulin, Total: 2.8 g/dL (ref 1.5–4.5)
Potassium: 4 mmol/L (ref 3.5–5.2)
Sodium: 141 mmol/L (ref 134–144)
TOTAL PROTEIN: 7 g/dL (ref 6.0–8.5)

## 2018-03-31 LAB — URINE CULTURE

## 2018-04-11 ENCOUNTER — Ambulatory Visit: Payer: Medicaid Other | Admitting: Clinical

## 2018-04-14 ENCOUNTER — Emergency Department (HOSPITAL_COMMUNITY)
Admission: EM | Admit: 2018-04-14 | Discharge: 2018-04-14 | Disposition: A | Discharge status: Home Health | Payer: Medicaid Other | Attending: Emergency Medicine | Admitting: Emergency Medicine

## 2018-04-14 ENCOUNTER — Other Ambulatory Visit: Payer: Self-pay

## 2018-04-14 ENCOUNTER — Encounter (HOSPITAL_COMMUNITY): Payer: Self-pay

## 2018-04-14 DIAGNOSIS — M549 Dorsalgia, unspecified: Secondary | ICD-10-CM | POA: Diagnosis present

## 2018-04-14 DIAGNOSIS — R208 Other disturbances of skin sensation: Secondary | ICD-10-CM | POA: Insufficient documentation

## 2018-04-14 DIAGNOSIS — F209 Schizophrenia, unspecified: Secondary | ICD-10-CM | POA: Insufficient documentation

## 2018-04-14 DIAGNOSIS — R209 Unspecified disturbances of skin sensation: Secondary | ICD-10-CM

## 2018-04-14 NOTE — ED Triage Notes (Signed)
Patient complaining of upper back pain that is chronic pain. Patient has not taken anything for the pain.

## 2018-04-14 NOTE — Discharge Instructions (Signed)
The cause of your burning pain is unclear.  Try over the counter lidocaine patches (salonpas) and apply directly to skin, switch every 24 hours.  Take tylenol.   Return to the ER if you have fevers, rash.  Follow up with your primary care doctor in 1 week if symptoms persist.

## 2018-04-15 NOTE — ED Provider Notes (Addendum)
[facility]-EMERGENCY DEPT Provider Note   CSN: [id number] Arrival date & time: 04/14/18  1817     History   Chief Complaint Chief Complaint  Patient presents with  . Back Pain    HPI Felicia Collier is a 63 y.o. female with h/o schizophrenia here for evaluation of back pain. Pain is described as "burning" and "stabbing" to bilateral sides of neck, trapezius and thoracic spine. States there "is a bat on my back".  She noticed it first while she was admitted at [facility]. Pain has been there for at least one month, constant but intermittently gets worse. Aggravating factors include nothing. Alleviating factors none. PCP gave her gabapentin but she did not know this was for back pain, states it has not helped. She denies trauma, falls, direct injury. Denies headache, neck stiffness, abdominal pain, flank pain, dysuria, hematuria, changes in BM, saddle anesthesia, b/b incontinence or retention, numbness or weakness unilaterally.  Per chart review pt in ER and PCP for pain attributed to spinal stenosis at lumbar level, she denies low back pain currently.   HPI  Past Medical History:  Diagnosis Date  . Chronic back pain   . Chronic knee pain   . Schizophrenia [facility])     Patient Active Problem List   Diagnosis Date Noted  . Pain in left wrist 07/20/2017  . Schizophrenia (HCC) 05/01/2017  . Chronic pain of left knee 02/09/2017  . Unilateral primary osteoarthritis, left knee 02/09/2017  . Neuropathy 11/22/2016  . Gastroesophageal reflux disease without esophagitis 11/22/2016  . Weight gain 11/22/2016  . Atypical chest pain 11/04/2016    Past Surgical History:  Procedure Laterality Date  . ABDOMINAL HYSTERECTOMY    . CHOLECYSTECTOMY       OB History   None      Home Medications    Prior to Admission medications   Medication Sig Start Date End Date Taking? Authorizing Provider  acetaminophen (TYLENOL) 500 MG tablet Take 1 tablet (500 mg total) by mouth  every 6 (six) hours as needed. Patient not taking: Reported on 03/29/2018 03/03/18   Jaynie Crumble, PA-C  benzonatate (TESSALON) 100 MG capsule Take 1 capsule (100 mg total) by mouth 3 (three) times daily as needed for cough. Patient not taking: Reported on 02/22/2018 02/15/18   Ward, Chase Picket, PA-C  gabapentin (NEURONTIN) 100 MG capsule Take 1 capsule (100 mg total) by mouth 2 (two) times daily. 03/29/18   Massie Maroon, FNP  methocarbamol (ROBAXIN) 500 MG tablet Take 1 tablet (500 mg total) by mouth 2 (two) times daily. Patient not taking: Reported on 03/29/2018 03/03/18   Jaynie Crumble, PA-C  saccharomyces boulardii (FLORASTOR) 250 MG capsule Take 1 capsule (250 mg total) by mouth 2 (two) times daily. 03/29/18   Massie Maroon, FNP    Family History Family History  Problem Relation Age of Onset  . Other Mother        no health conditions per patient  . Other Father        no health conditions per patient    Social History Social History   Tobacco Use  . Smoking status: Never Smoker  . Smokeless tobacco: Never Used  Substance Use Topics  . Alcohol use: No  . Drug use: No     Allergies   Lidocaine   Review of Systems Review of Systems  Musculoskeletal: Positive for back pain and myalgias.  All other systems reviewed and are negative.    Physical Exam Updated Vital  Signs BP (!) 164/86 (BP Location: Right Arm)   Pulse (!) 55   Temp 97.9 F (36.6 C) (Oral)   Resp 18   Ht 5\' 5"  (1.651 m)   Wt 72.6 kg (160 lb)   SpO2 99%   BMI 26.63 kg/m   Physical Exam  Constitutional: She is oriented to person, place, and time. She appears well-developed and well-nourished. No distress.  NAD.  HENT:  Head: Normocephalic and atraumatic.  Right Ear: External ear normal.  Left Ear: External ear normal.  Nose: Nose normal.  Eyes: Conjunctivae and EOM are normal. No scleral icterus.  Neck: Normal range of motion. Neck supple.  Cardiovascular: Normal rate,  regular rhythm and normal heart sounds.  No murmur heard. Pulmonary/Chest: Effort normal and breath sounds normal. She has no wheezes.  Musculoskeletal: Normal range of motion. She exhibits no deformity.  CTL spine: no midline or paraspinal muscular tenderness. Patient states palpation does not make the pain worse.  Ambulatory and stand out of chair without difficulty.   Neurological: She is alert and oriented to person, place, and time.  5/5 strength with hand grip and big to flexion/extension. Sensation to pinch intact in hands and feet.   Skin: Skin is warm and dry. Capillary refill takes less than 2 seconds.  Psychiatric: She has a normal mood and affect. Her behavior is normal. Judgment and thought content normal.  Nursing note and vitals reviewed.    ED Treatments / Results  Labs (all labs ordered are listed, but only abnormal results are displayed) Labs Reviewed - No data to display  EKG None  Radiology No results found.  Procedures Procedures (including critical care time)  Medications Ordered in ED Medications - No data to display   Initial Impression / Assessment and Plan / ED Course  I have reviewed the triage vital signs and the nursing notes.  Pertinent labs & imaging results that were available during my care of the patient were reviewed by me and considered in my medical decision making (see chart for details).    62 yo F with h/o schizophrenia here for back and neck discomfort described as "burning", "stabbing" pain.  Patient states there is a "bad in my back". Chronic. Chart review shows pt has seen PCP for same sensation and ER as well. She was rx gabapentin but pt states she didn't know this medicine was for her back. ddx includes MSK vs radiculopathy. Psychiatric illness may be contributing to atypical symptomatology.   Exam as above without any reproducible midline or paraspinal CTL spine tenderness. No overlaying rash. No infectious symptoms or cough, CP.  No trauma. No signs of cauda equina. No signs of infection. Extremities NVI. I do not think emergent imaging or lab work indicated today.  Etiology unclear but low suspicion for chest, abdominal neck emergency.  Will refer back to PCP for further discussion of symptoms.   Final Clinical Impressions(s) / ED Diagnoses   Final diagnoses:  Skin sensation disturbance    ED Discharge Orders    None        Jerrell MylarGibbons, Kadrian Partch J, PA-C 04/15/18 1953    Lorre NickAllen, Anthony, MD 04/16/18 2250

## 2018-04-26 ENCOUNTER — Ambulatory Visit (INDEPENDENT_AMBULATORY_CARE_PROVIDER_SITE_OTHER): Payer: Medicaid Other | Admitting: Orthopaedic Surgery

## 2018-04-26 ENCOUNTER — Encounter (INDEPENDENT_AMBULATORY_CARE_PROVIDER_SITE_OTHER): Payer: Self-pay | Admitting: Orthopaedic Surgery

## 2018-04-26 VITALS — BP 127/82 | HR 61 | Ht 65.0 in | Wt 160.0 lb

## 2018-04-26 DIAGNOSIS — M545 Low back pain, unspecified: Secondary | ICD-10-CM

## 2018-04-26 DIAGNOSIS — M542 Cervicalgia: Secondary | ICD-10-CM | POA: Diagnosis not present

## 2018-05-01 ENCOUNTER — Encounter (INDEPENDENT_AMBULATORY_CARE_PROVIDER_SITE_OTHER): Payer: Self-pay | Admitting: Orthopaedic Surgery

## 2018-05-01 NOTE — Progress Notes (Signed)
Office Visit Note   Patient: Felicia Collier           Date of Birth: 1956-02-18           MRN: [id number] Visit Date: 04/26/2018              Requested by: Massie Maroon, FNP [address] [place], [place] [address] PCP: Kallie Locks, FNP   Assessment & Plan: Visit Diagnoses:  1. Neck pain   2. Low back pain without sciatica, unspecified back pain laterality, unspecified chronicity     Plan: Is working on a walking program and gradual increase exercise activities.  He does have some lumbar facet osteoarthritis intact neurologic exam.  She can return if she develops radicular or increasing symptoms.  Follow-Up Instructions: Return if symptoms worsen or fail to improve.   Orders:  No orders of the defined types were placed in this encounter.  No orders of the defined types were placed in this encounter.     Procedures: No procedures performed   Clinical Data: No additional findings.   Subjective: Chief Complaint  Patient presents with  . Spine - Pain    HPI 62 year old female seen with complaints of pain that radiates from her neck all the way down to her coccyx.  She states she has pain in her arms and legs.  She states she feels sore all over is taken Tylenol and ibuprofen.  He states Tylenol and ibuprofen both gave her a fever and made her pain worse.  Been seen in the emergency room where she felt that she had a feeling of " a bat is on my back ".  Negative history of for falling.  No hematuria dysuria.  Previous CT scan in April showed some spondylosis upper thoracic degenerative facets.  Questionable L4 endplate fracture chronic.  Mild facet degenerative changes in the lumbar spine without foraminal compression.  She did have some moderate narrowing at L4-5.  CT cervical 02/22/2018 showed normal findings without compression other than some moderate right C4-5 foraminal narrowing.  Review of Systems positive for knee arthritis.  Weight gain,  schizophrenia, neuropathy, atypical chest pain.  Is otherwise negative as it pertains HPI.   Objective: Vital Signs: BP 127/82   Pulse 61   Ht 5\' 5"  (1.651 m)   Wt 160 lb (72.6 kg)   BMI 26.63 kg/m   Physical Exam  Constitutional: She is oriented to person, place, and time. She appears well-developed.  HENT:  Head: Normocephalic.  Right Ear: External ear normal.  Left Ear: External ear normal.  Eyes: Pupils are equal, round, and reactive to light.  Neck: No tracheal deviation present. No thyromegaly present.  Cardiovascular: Normal rate.  Pulmonary/Chest: Effort normal.  Abdominal: Soft.  Neurological: She is alert and oriented to person, place, and time.  Skin: Skin is warm and dry.  Psychiatric:   Flat affect, patient is conversant.    Ortho Exam plexus tenderness.  She complains of pain with palpation over the cervical and lumbar spine.  Positive lumbar pinch test causing radicular symptoms.  Negative straight leg raising 90 degrees.  Upper and lower extremity reflexes are 2+ and symmetrical.  No upper or lower extremity atrophy.  No rash over exposed skin.  Specialty Comments:  No specialty comments available.  Imaging: No results found.   PMFS History: Patient Active Problem List   Diagnosis Date Noted  . Pain in left wrist 07/20/2017  . Schizophrenia (HCC) 05/01/2017  .  Chronic pain of left knee 02/09/2017  . Unilateral primary osteoarthritis, left knee 02/09/2017  . Neuropathy 11/22/2016  . Gastroesophageal reflux disease without esophagitis 11/22/2016  . Weight gain 11/22/2016  . Atypical chest pain 11/04/2016   Past Medical History:  Diagnosis Date  . Chronic back pain   . Chronic knee pain   . Schizophrenia (HCC)     Family History  Problem Relation Age of Onset  . Other Mother        no health conditions per patient  . Other Father        no health conditions per patient    Past Surgical History:  Procedure Laterality Date  . ABDOMINAL  HYSTERECTOMY    . CHOLECYSTECTOMY     Social History   Occupational History  . Not on file  Tobacco Use  . Smoking status: Never Smoker  . Smokeless tobacco: Never Used  Substance and Sexual Activity  . Alcohol use: No  . Drug use: No  . Sexual activity: Not Currently

## 2018-05-03 ENCOUNTER — Emergency Department (HOSPITAL_COMMUNITY)
Admission: EM | Admit: 2018-05-03 | Discharge: 2018-05-03 | Disposition: A | Discharge status: Home / Self Care | Payer: Medicaid Other | Attending: Emergency Medicine | Admitting: Emergency Medicine

## 2018-05-03 ENCOUNTER — Encounter (HOSPITAL_COMMUNITY): Payer: Self-pay | Admitting: Emergency Medicine

## 2018-05-03 DIAGNOSIS — M25531 Pain in right wrist: Secondary | ICD-10-CM

## 2018-05-03 DIAGNOSIS — M25532 Pain in left wrist: Secondary | ICD-10-CM | POA: Diagnosis present

## 2018-05-03 DIAGNOSIS — R202 Paresthesia of skin: Secondary | ICD-10-CM | POA: Insufficient documentation

## 2018-05-03 DIAGNOSIS — H5789 Other specified disorders of eye and adnexa: Secondary | ICD-10-CM | POA: Diagnosis not present

## 2018-05-03 MED ORDER — CETIRIZINE-PSEUDOEPHEDRINE ER 5-120 MG PO TB12: 1.0000 | ORAL_TABLET | Freq: Every day | ORAL | 0 refills | Status: DC

## 2018-05-03 NOTE — ED Provider Notes (Signed)
[facility]-EMERGENCY DEPT Provider Note   CSN: [id number] Arrival date & time: 05/03/18  0547     History   Chief Complaint Chief Complaint  Patient presents with  . Abscess  . Eye Pain    HPI Felicia Collier is a 62 y.o. female.  HPI   62 year old female with history of schizophrenia, chronic back and knee pain, neuropathy presenting with multiple complaints.  First, patient complaining of pain to both of her wrist left greater than right ongoing for the past several weeks.  Pain is described as a soreness, constant, nothing seems to make it better or worse.  She is left-handed.  She endorsed occasional tingling sensation to her fingers on both side but denies dropping objects, or weakness.  No specific injury to her wrist.  She also complaining of shoulder and elbow pain for the past several weeks but has had similar pain like this in the past.  She also noticed a small bump on her left forearm.  It is nontender but she was concerned.  She is unsure how long it has been on the skin but believes it may have been there since 2 weeks ago when she was seen in the ED and had blood drawn.  For the past month she has noticed sinus congestion, occasional sneezing, itchy eyes, and watery teary eyes.  She denies any vision changes, or pain with eye movement.  She does not wear any contact lens.  Past Medical History:  Diagnosis Date  . Chronic back pain   . Chronic knee pain   . Schizophrenia [facility])     Patient Active Problem List   Diagnosis Date Noted  . Pain in left wrist 07/20/2017  . Schizophrenia (HCC) 05/01/2017  . Chronic pain of left knee 02/09/2017  . Unilateral primary osteoarthritis, left knee 02/09/2017  . Neuropathy 11/22/2016  . Gastroesophageal reflux disease without esophagitis 11/22/2016  . Weight gain 11/22/2016  . Atypical chest pain 11/04/2016    Past Surgical History:  Procedure Laterality Date  . ABDOMINAL HYSTERECTOMY    . CHOLECYSTECTOMY        OB History   None      Home Medications    Prior to Admission medications   Medication Sig Start Date End Date Taking? Authorizing Provider  acetaminophen (TYLENOL) 500 MG tablet Take 1 tablet (500 mg total) by mouth every 6 (six) hours as needed. Patient not taking: Reported on 03/29/2018 03/03/18   Jaynie Crumble, PA-C  benzonatate (TESSALON) 100 MG capsule Take 1 capsule (100 mg total) by mouth 3 (three) times daily as needed for cough. Patient not taking: Reported on 02/22/2018 02/15/18   Ward, Chase Picket, PA-C  gabapentin (NEURONTIN) 100 MG capsule Take 1 capsule (100 mg total) by mouth 2 (two) times daily. Patient not taking: Reported on 04/26/2018 03/29/18   Massie Maroon, FNP  methocarbamol (ROBAXIN) 500 MG tablet Take 1 tablet (500 mg total) by mouth 2 (two) times daily. Patient not taking: Reported on 03/29/2018 03/03/18   Jaynie Crumble, PA-C  saccharomyces boulardii (FLORASTOR) 250 MG capsule Take 1 capsule (250 mg total) by mouth 2 (two) times daily. Patient not taking: Reported on 04/26/2018 03/29/18   Massie Maroon, FNP    Family History Family History  Problem Relation Age of Onset  . Other Mother        no health conditions per patient  . Other Father        no health conditions per patient  Social History Social History   Tobacco Use  . Smoking status: Never Smoker  . Smokeless tobacco: Never Used  Substance Use Topics  . Alcohol use: No  . Drug use: No     Allergies   Lidocaine   Review of Systems Review of Systems  All other systems reviewed and are negative.    Physical Exam Updated Vital Signs BP (!) 142/81 (BP Location: Left Arm)   Pulse 75   Temp 98.4 F (36.9 C) (Oral)   Resp 18   SpO2 99%   Physical Exam  Constitutional: She appears well-developed and well-nourished. No distress.  HENT:  Head: Atraumatic.  Right Ear: External ear normal.  Left Ear: External ear normal.  Nose: Nose normal.    Mouth/Throat: Oropharynx is clear and moist.  Eyes: Pupils are equal, round, and reactive to light. Conjunctivae and EOM are normal.  Neck: Neck supple.  Musculoskeletal: She exhibits tenderness (Mild tenderness to bilateral wrists without edema, erythema or decreased range of motion.  Radial pulse 2+.  Normal grip strength bilaterally.).  Bilateral elbow and shoulder with full range of motion and no deformity.  Neurological: She is alert.  Skin: No rash noted.  Left forearm: A hyperpigmented nodule less than 0.5 mm noted to the mid volar forearm that is nontender to palpation and without any fluctuant.  Psychiatric: She has a normal mood and affect.  Nursing note and vitals reviewed.    ED Treatments / Results  Labs (all labs ordered are listed, but only abnormal results are displayed) Labs Reviewed - No data to display  EKG None  Radiology No results found.  Procedures Procedures (including critical care time)  Medications Ordered in ED Medications - No data to display   Initial Impression / Assessment and Plan / ED Course  I have reviewed the triage vital signs and the nursing notes.  Pertinent labs & imaging results that were available during my care of the patient were reviewed by me and considered in my medical decision making (see chart for details).     BP (!) 142/81 (BP Location: Left Arm)   Pulse 75   Temp 98.4 F (36.9 C) (Oral)   Resp 18   SpO2 99%    Final Clinical Impressions(s) / ED Diagnoses   Final diagnoses:  Irritation of eye  Bilateral wrist pain    ED Discharge Orders        Ordered    cetirizine-pseudoephedrine (ZYRTEC-D) 5-120 MG tablet  Daily     05/03/18 0652     6:47 AM Patient with multiple complaints, her biggest complaint is bilateral wrist pain left greater than right for the past several weeks.  Pain is likely arthritis, possible carpal tunnel syndrome however patient is wearing a brace and does not have any focal weakness.   No signs of infectious etiology and no recent trauma.  I offered over-the-counter medication such as Tylenol and ibuprofen but patient report having side effects to both.  I do not think opiate pain medication is indicated at this time.  Recommend follow-up with PCP for further care.  Patient endorsed teary, itchy eyes with sinus congestion and occasional sneezing.  Suspect allergies causing his symptoms.  Will prescribe Zyrtec low suspicion for ocular emergency.  Patient also complaining of a spot on her left forearm.  There is a hyper pigmented nodule less than 1.5 mm of insignificant etiology.  It does not appears to be malignancy or infectious etiology.  Reassurance given.  Recommend patient  to continue to monitor.   Fayrene Helper, PA-C 05/03/18 1610    Geoffery Lyons, MD 05/03/18 [phone]

## 2018-05-03 NOTE — ED Triage Notes (Signed)
Pt from home with c/o abscess on left anterior forearm onset 2 weeks. No drainage noted.       Pt also reports bilat eye burning sensation denies event or injury.

## 2018-05-03 NOTE — Discharge Instructions (Signed)
Continue wearing your wrist brace as it will help with your wrist pain.  Exercise regularly, do stretching, avoid watching TV and laying still for a prolonged period of time.  For your eye discomfort, take zyrtec daily as prescribed.  Follow up with your doctor for further care.

## 2018-05-11 ENCOUNTER — Telehealth: Payer: Self-pay

## 2018-05-11 ENCOUNTER — Ambulatory Visit (INDEPENDENT_AMBULATORY_CARE_PROVIDER_SITE_OTHER): Payer: Medicaid Other | Admitting: Clinical

## 2018-05-11 DIAGNOSIS — F209 Schizophrenia, unspecified: Secondary | ICD-10-CM | POA: Diagnosis not present

## 2018-05-11 NOTE — Telephone Encounter (Signed)
Doctor from [facility] called regarding patient and would like to speak with you about care for patient. [phone].

## 2018-05-15 ENCOUNTER — Other Ambulatory Visit: Payer: Self-pay

## 2018-05-15 ENCOUNTER — Emergency Department (HOSPITAL_COMMUNITY)
Admission: EM | Admit: 2018-05-15 | Discharge: 2018-05-16 | Disposition: A | Discharge status: Home / Self Care | Payer: Medicaid Other | Attending: Emergency Medicine | Admitting: Emergency Medicine

## 2018-05-15 DIAGNOSIS — M130 Polyarthritis, unspecified: Secondary | ICD-10-CM | POA: Insufficient documentation

## 2018-05-15 DIAGNOSIS — M2550 Pain in unspecified joint: Secondary | ICD-10-CM

## 2018-05-15 DIAGNOSIS — M7918 Myalgia, other site: Secondary | ICD-10-CM | POA: Diagnosis present

## 2018-05-15 LAB — I-STAT CHEM 8, ED
BUN: 5 mg/dL — AB (ref 8–23)
CALCIUM ION: 1.16 mmol/L (ref 1.15–1.40)
CHLORIDE: 103 mmol/L (ref 98–111)
Creatinine, Ser: 0.6 mg/dL (ref 0.44–1.00)
Glucose, Bld: 95 mg/dL (ref 70–99)
HCT: 40 % (ref 36.0–46.0)
Hemoglobin: 13.6 g/dL (ref 12.0–15.0)
POTASSIUM: 3.2 mmol/L — AB (ref 3.5–5.1)
SODIUM: 141 mmol/L (ref 135–145)
TCO2: 25 mmol/L (ref 22–32)

## 2018-05-15 LAB — CBC WITH DIFFERENTIAL/PLATELET
BASOS ABS: 0 10*3/uL (ref 0.0–0.1)
Basophils Relative: 0 %
EOS ABS: 0.3 10*3/uL (ref 0.0–0.7)
Eosinophils Relative: 3 %
HCT: 40.1 % (ref 36.0–46.0)
HEMOGLOBIN: 12.7 g/dL (ref 12.0–15.0)
LYMPHS ABS: 2.8 10*3/uL (ref 0.7–4.0)
LYMPHS PCT: 27 %
MCH: 25.9 pg — ABNORMAL LOW (ref 26.0–34.0)
MCHC: 31.7 g/dL (ref 30.0–36.0)
MCV: 81.8 fL (ref 78.0–100.0)
Monocytes Absolute: 0.8 10*3/uL (ref 0.1–1.0)
Monocytes Relative: 8 %
NEUTROS PCT: 62 %
Neutro Abs: 6.4 10*3/uL (ref 1.7–7.7)
Platelets: 304 10*3/uL (ref 150–400)
RBC: 4.9 MIL/uL (ref 3.87–5.11)
RDW: 13.8 % (ref 11.5–15.5)
WBC: 10.3 10*3/uL (ref 4.0–10.5)

## 2018-05-15 MED ORDER — POTASSIUM CHLORIDE CRYS ER 20 MEQ PO TBCR
40.0000 meq | EXTENDED_RELEASE_TABLET | Freq: Once | ORAL | Status: AC
Start: 2018-05-15 — End: 2018-05-16
  Administered 2018-05-16: 40 meq via ORAL
  Filled 2018-05-15: qty 2

## 2018-05-15 NOTE — Telephone Encounter (Signed)
-----   Message from Kallie LocksNatalie M Stroud, FNP sent at 05/14/2018  9:23 PM EDT ----- Regarding: "Routed Message" Felicia Collier,   I did not receive message. Could you please re-send to me?

## 2018-05-15 NOTE — Telephone Encounter (Signed)
Provider from [facility] would like to speak with you regarding patient treatment. [phone]. Best time to call would be early Monday morning

## 2018-05-15 NOTE — ED Triage Notes (Signed)
Pt from home with c/o full body aches and pains. Pt denies falls or other injury.

## 2018-05-15 NOTE — ED Provider Notes (Signed)
[facility]-EMERGENCY DEPT Provider Note   CSN: [id number] Arrival date & time: 05/15/18  2034     History   Chief Complaint Chief Complaint  Patient presents with  . Generalized Body Aches    HPI Felicia Collier is a 62 y.o. female.  The history is provided by the patient. No language interpreter was used.     62 year old female with history of schizophrenia, history of chronic pain presenting for evaluation of joint pain.  Patient is a poor historian.  For the past several weeks she has had pain to all of her joints including her shoulders, elbows, wrists, hips, knees, ankles and foot.  She describes this as an achy sensation, persistent throughout the day nothing makes it better or worse.  No specific treatment tried.  When asked if she has fever she said yes, when asked if she has abdominal pain she said yes but patient denies any dysuria, pain in her chest, trouble breathing or coughing.  Patient has not follow-up with his PCP yet.  Patient was last seen by me 2 weeks prior with a host of different complaints some of which includes bilateral wrist pain and elbow pain.  Work-up was unremarkable at that time.  Past Medical History:  Diagnosis Date  . Chronic back pain   . Chronic knee pain   . Schizophrenia [facility])     Patient Active Problem List   Diagnosis Date Noted  . Pain in left wrist 07/20/2017  . Schizophrenia (HCC) 05/01/2017  . Chronic pain of left knee 02/09/2017  . Unilateral primary osteoarthritis, left knee 02/09/2017  . Neuropathy 11/22/2016  . Gastroesophageal reflux disease without esophagitis 11/22/2016  . Weight gain 11/22/2016  . Atypical chest pain 11/04/2016    Past Surgical History:  Procedure Laterality Date  . ABDOMINAL HYSTERECTOMY    . CHOLECYSTECTOMY       OB History   None      Home Medications    Prior to Admission medications   Medication Sig Start Date End Date Taking? Authorizing Provider    cetirizine-pseudoephedrine (ZYRTEC-D) 5-120 MG tablet Take 1 tablet by mouth daily. 05/03/18   Fayrene Helper, PA-C    Family History Family History  Problem Relation Age of Onset  . Other Mother        no health conditions per patient  . Other Father        no health conditions per patient    Social History Social History   Tobacco Use  . Smoking status: Never Smoker  . Smokeless tobacco: Never Used  Substance Use Topics  . Alcohol use: No  . Drug use: No     Allergies   Lidocaine   Review of Systems Review of Systems  All other systems reviewed and are negative.    Physical Exam Updated Vital Signs BP (!) 151/71 (BP Location: Left Arm)   Pulse 88   Temp 98 F (36.7 C) (Oral)   Resp 18   Ht 5\' 5"  (1.651 m)   Wt 72.6 kg (160 lb)   SpO2 98%   BMI 26.63 kg/m   Physical Exam  Constitutional: She appears well-developed and well-nourished. No distress.  Patient resting in bed appears comfortable in no acute distress.  HENT:  Head: Atraumatic.  Mouth/Throat: Oropharynx is clear and moist.  Eyes: Pupils are equal, round, and reactive to light. Conjunctivae and EOM are normal.  Neck: Normal range of motion. Neck supple.  No nuchal rigidity  Cardiovascular: Normal rate,  regular rhythm and intact distal pulses.  Pulmonary/Chest: Effort normal and breath sounds normal.  Abdominal: Soft. Bowel sounds are normal. She exhibits no distension. There is no tenderness.  Musculoskeletal: She exhibits no tenderness.  Palpation of her bilateral shoulder, elbow, wrists, hips, knees, and ankles without reproducible tenderness.  Full range of motion throughout all major joints without swelling or erythema.  Neurological: She is alert.  Skin: No rash noted.  Psychiatric: She has a normal mood and affect.  Nursing note and vitals reviewed.    ED Treatments / Results  Labs (all labs ordered are listed, but only abnormal results are displayed) Labs Reviewed  CBC WITH  DIFFERENTIAL/PLATELET - Abnormal; Notable for the following components:      Result Value   MCH 25.9 (*)    All other components within normal limits  I-STAT CHEM 8, ED - Abnormal; Notable for the following components:   Potassium 3.2 (*)    BUN 5 (*)    All other components within normal limits    EKG None  Radiology No results found.  Procedures Procedures (including critical care time)  Medications Ordered in ED Medications  potassium chloride SA (K-DUR,KLOR-CON) CR tablet 40 mEq (40 mEq Oral Given 05/16/18 0023)     Initial Impression / Assessment and Plan / ED Course  I have reviewed the triage vital signs and the nursing notes.  Pertinent labs & imaging results that were available during my care of the patient were reviewed by me and considered in my medical decision making (see chart for details).     BP (!) 151/71 (BP Location: Left Arm)   Pulse 88   Temp 98 F (36.7 C) (Oral)   Resp 18   Ht 5\' 5"  (1.651 m)   Wt 72.6 kg (160 lb)   SpO2 98%   BMI 26.63 kg/m    Final Clinical Impressions(s) / ED Diagnoses   Final diagnoses:  Polyarthralgia    ED Discharge Orders        Ordered    acetaminophen (TYLENOL) 500 MG tablet  Every 6 hours PRN     05/16/18 0027     10:26 PM Patient here with multiple complaints but primary complaints of joint pain throughout.  She has full range of motion throughout all major joints without any signs of infection and no signs of injury.  She does have history of chronic pain and arthritis.  Anticipate this is likely her baseline underlying pain.  She is resting comfortably.  Will perform some screening basic labs.  12:26 AM Labs remarkable for a mild hypokalemia with potassium of 3.2, supplementation given.  The remainder of her labs are reassuring.  Symptoms suggestive of arthritis.  Encourage patient to use tylenol as needed. RICE therapy discussed.  Otherwise she is stable for discharge.  Return precautions discussed.     Fayrene Helperran, Lawren Sexson, PA-C 05/16/18 Eligha [id number]    Lockwood, Robert, MD 05/17/18 Rich [id number]

## 2018-05-16 MED ORDER — ACETAMINOPHEN 500 MG PO TABS: 500.0000 mg | ORAL_TABLET | Freq: Four times a day (QID) | ORAL | 0 refills | Status: DC | PRN

## 2018-05-31 ENCOUNTER — Ambulatory Visit (INDEPENDENT_AMBULATORY_CARE_PROVIDER_SITE_OTHER): Payer: Medicaid Other | Admitting: Family Medicine

## 2018-05-31 ENCOUNTER — Encounter: Payer: Self-pay | Admitting: Family Medicine

## 2018-05-31 VITALS — BP 140/72 | HR 74 | Temp 97.6°F | Ht 65.0 in | Wt 167.0 lb

## 2018-05-31 DIAGNOSIS — R1084 Generalized abdominal pain: Secondary | ICD-10-CM | POA: Diagnosis not present

## 2018-05-31 DIAGNOSIS — R8290 Unspecified abnormal findings in urine: Secondary | ICD-10-CM

## 2018-05-31 DIAGNOSIS — R109 Unspecified abdominal pain: Secondary | ICD-10-CM | POA: Diagnosis not present

## 2018-05-31 DIAGNOSIS — R52 Pain, unspecified: Secondary | ICD-10-CM

## 2018-05-31 DIAGNOSIS — N390 Urinary tract infection, site not specified: Secondary | ICD-10-CM | POA: Diagnosis not present

## 2018-05-31 DIAGNOSIS — Z09 Encounter for follow-up examination after completed treatment for conditions other than malignant neoplasm: Secondary | ICD-10-CM

## 2018-05-31 LAB — POCT URINALYSIS DIPSTICK
Bilirubin, UA: NEGATIVE
Blood, UA: NEGATIVE
Glucose, UA: NEGATIVE
Ketones, UA: NEGATIVE
Nitrite, UA: NEGATIVE
Protein, UA: NEGATIVE
Spec Grav, UA: 1.015 (ref 1.010–1.025)
Urobilinogen, UA: 0.2 E.U./dL
pH, UA: 6.5 (ref 5.0–8.0)

## 2018-05-31 MED ORDER — GABAPENTIN 100 MG PO CAPS: 100.0000 mg | ORAL_CAPSULE | Freq: Three times a day (TID) | ORAL | 1 refills | Status: DC

## 2018-05-31 MED ORDER — SULFAMETHOXAZOLE-TRIMETHOPRIM 800-160 MG PO TABS: 1.0000 | ORAL_TABLET | Freq: Two times a day (BID) | ORAL | 0 refills | Status: DC

## 2018-05-31 NOTE — Progress Notes (Addendum)
Subjective:    Patient ID: Felicia Collier, female    DOB: 1956/10/16, 62 y.o.   MRN: [id number]  PCP: Raliegh IpNatalie Jonaya Freshour, NP  HPI  Ms. Felicia Collier has a past medical history of Schizophrenia, Chronic Knee Pain, and Chronic Back Pain. She is here today for hospital follow up.   Current Status: Since her last office visit, she has been having increasing pain/soreness in her neck, back, hands, and feet.   She denies fevers, chills, fatigue, recent infections, weight loss, and night sweats.   She reports occasional headaches, which she takes Acetaminophen for relief. She has not had any visual changes, dizziness, and falls.  She has shortness of breath on exertion. No chest pain, heart palpitations, and cough reported.   She has mild soreness in her abdomen/flank areas. She has occasional vomiting after she eats. No other  reports of GI problems such as nausea, diarrhea, and constipation. She has no reports of blood in stools, dysuria and hematuria.   She has diagnosis of Schizophrenia. She has mild anxiety r/t transportation issues today. She denies suicidal ideations, homicidal ideations, or auditory hallucinations today.    Past Medical History:  Diagnosis Date  . Chronic back pain   . Chronic knee pain   . Schizophrenia (HCC)     Family History  Problem Relation Age of Onset  . Other Mother        no health conditions per patient  . Other Father        no health conditions per patient    Social History   Socioeconomic History  . Marital status: Single    Spouse name: Not on file  . Number of children: Not on file  . Years of education: Not on file  . Highest education level: Not on file  Occupational History  . Not on file  Social Needs  . Financial resource strain: Not on file  . Food insecurity:    Worry: Not on file    Inability: Not on file  . Transportation needs:    Medical: Not on file    Non-medical: Not on file  Tobacco Use  . Smoking status: Never Smoker  .  Smokeless tobacco: Never Used  Substance and Sexual Activity  . Alcohol use: No  . Drug use: No  . Sexual activity: Not Currently  Lifestyle  . Physical activity:    Days per week: Not on file    Minutes per session: Not on file  . Stress: Not on file  Relationships  . Social connections:    Talks on phone: Not on file    Gets together: Not on file    Attends religious service: Not on file    Active member of club or organization: Not on file    Attends meetings of clubs or organizations: Not on file    Relationship status: Not on file  . Intimate partner violence:    Fear of current or ex partner: Not on file    Emotionally abused: Not on file    Physically abused: Not on file    Forced sexual activity: Not on file  Other Topics Concern  . Not on file  Social History Narrative  . Not on file    No care team member to display   Immunization History  Administered Date(s) Administered  . Influenza,inj,Quad PF,6+ Mos 09/26/2017  . Pneumococcal Conjugate-13 12/13/2016  . Tdap 05/24/2016    No outpatient medications have been marked as taking for the 05/31/18  encounter (Office Visit) with Kallie Locks, FNP.    Allergies  Allergen Reactions  . Lidocaine     Face swelling     BP 140/72 (BP Location: Left Arm, Patient Position: Sitting, Cuff Size: Large)   Pulse 74   Temp 97.6 F (36.4 C) (Oral)   Ht 5\' 5"  (1.651 m)   Wt 167 lb (75.8 kg)   SpO2 98%   BMI 27.79 kg/m   Review of Systems  Constitutional: Negative.   HENT: Negative.   Eyes: Negative.   Respiratory: Positive for shortness of breath (on exertion).   Cardiovascular: Negative.   Gastrointestinal: Positive for abdominal distention (Obese) and abdominal pain (generalized).  Endocrine: Negative.   Genitourinary: Positive for flank pain (bilateral).  Musculoskeletal: Positive for arthralgias (neck, back, hands, and feet. ).  Skin: Negative.   Allergic/Immunologic: Negative.   Neurological:  Negative.   Hematological: Negative.   Psychiatric/Behavioral: Negative.    Objective:   Physical Exam  Constitutional: She is oriented to person, place, and time. She appears well-developed and well-nourished.  HENT:  Head: Normocephalic and atraumatic.  Right Ear: External ear normal.  Left Ear: External ear normal.  Nose: Nose normal.  Mouth/Throat: Oropharynx is clear and moist.  Eyes: Pupils are equal, round, and reactive to light. Conjunctivae and EOM are normal.  Neck: Normal range of motion. Neck supple.  Cardiovascular: Normal rate, regular rhythm, normal heart sounds and intact distal pulses.  Pulmonary/Chest: Effort normal and breath sounds normal.  Abdominal: Soft. Bowel sounds are normal. She exhibits distension (Obese).  Musculoskeletal: Normal range of motion.  Neurological: She is alert and oriented to person, place, and time.  Skin: Skin is warm and dry. Capillary refill takes less than 2 seconds.  Psychiatric: She has a normal mood and affect. Her behavior is normal. Judgment and thought content normal.  Nursing note and vitals reviewed.  Assessment & Plan:   1. Pain Reports generalized pain. We will initiate Gabapentin today.  - gabapentin (NEURONTIN) 100 MG capsule; Take 1 capsule (100 mg total) by mouth 3 (three) times daily.  Dispense: 90 capsule; Refill: 1  2. Bilateral flank pain Urinalysis to r/o UTI.  - Urinalysis Dipstick  3. Abdominal discomfort, generalized Stable. We will continue to monitor.   4. Urinary tract infection Urinalysis is positive for Leukocytes. Rx for Septra to pharmacy. We will order Urine Culture to further evaluate.   5. Urine culture Urinalysis is abnormal today. Evaluate further.   6. Follow up She will keep previously scheduled appointment for August 2019.  Meds ordered this encounter  Medications  . gabapentin (NEURONTIN) 100 MG capsule    Sig: Take 1 capsule (100 mg total) by mouth 3 (three) times daily.     Dispense:  90 capsule    Refill:  1  . sulfamethoxazole-trimethoprim (BACTRIM DS,SEPTRA DS) 800-160 MG tablet    Sig: Take 1 tablet by mouth 2 (two) times daily.    Dispense:  14 tablet    Refill:  0   Raliegh Ip,  MSN, FNP-C Patient [facility] [address] [place], [place] [address] [phone]

## 2018-05-31 NOTE — Patient Instructions (Addendum)
Gabapentin capsules or tablets What is this medicine? GABAPENTIN (GA ba pen tin) is used to control partial seizures in adults with epilepsy. It is also used to treat certain types of nerve pain. This medicine may be used for other purposes; ask your health care provider or pharmacist if you have questions. COMMON BRAND NAME(S): Active-PAC with Gabapentin, Gabarone, Neurontin What should I tell my health care provider before I take this medicine? They need to know if you have any of these conditions: -kidney disease -suicidal thoughts, plans, or attempt; a previous suicide attempt by you or a family member -an unusual or allergic reaction to gabapentin, other medicines, foods, dyes, or preservatives -pregnant or trying to get pregnant -breast-feeding How should I use this medicine? Take this medicine by mouth with a glass of water. Follow the directions on the prescription label. You can take it with or without food. If it upsets your stomach, take it with food.Take your medicine at regular intervals. Do not take it more often than directed. Do not stop taking except on your doctor's advice. If you are directed to break the 600 or 800 mg tablets in half as part of your dose, the extra half tablet should be used for the next dose. If you have not used the extra half tablet within 28 days, it should be thrown away. A special MedGuide will be given to you by the pharmacist with each prescription and refill. Be sure to read this information carefully each time. Talk to your pediatrician regarding the use of this medicine in children. Special care may be needed. Overdosage: If you think you have taken too much of this medicine contact a poison control center or emergency room at once. NOTE: This medicine is only for you. Do not share this medicine with others. What if I miss a dose? If you miss a dose, take it as soon as you can. If it is almost time for your next dose, take only that dose. Do not  take double or extra doses. What may interact with this medicine? Do not take this medicine with any of the following medications: -other gabapentin products This medicine may also interact with the following medications: -alcohol -antacids -antihistamines for allergy, cough and cold -certain medicines for anxiety or sleep -certain medicines for depression or psychotic disturbances -homatropine; hydrocodone -naproxen -narcotic medicines (opiates) for pain -phenothiazines like chlorpromazine, mesoridazine, prochlorperazine, thioridazine This list may not describe all possible interactions. Give your health care provider a list of all the medicines, herbs, non-prescription drugs, or dietary supplements you use. Also tell them if you smoke, drink alcohol, or use illegal drugs. Some items may interact with your medicine. What should I watch for while using this medicine? Visit your doctor or health care professional for regular checks on your progress. You may want to keep a record at home of how you feel your condition is responding to treatment. You may want to share this information with your doctor or health care professional at each visit. You should contact your doctor or health care professional if your seizures get worse or if you have any new types of seizures. Do not stop taking this medicine or any of your seizure medicines unless instructed by your doctor or health care professional. Stopping your medicine suddenly can increase your seizures or their severity. Wear a medical identification bracelet or chain if you are taking this medicine for seizures, and carry a card that lists all your medications. You may get drowsy, dizzy,   or have blurred vision. Do not drive, use machinery, or do anything that needs mental alertness until you know how this medicine affects you. To reduce dizzy or fainting spells, do not sit or stand up quickly, especially if you are an older patient. Alcohol can  increase drowsiness and dizziness. Avoid alcoholic drinks. Your mouth may get dry. Chewing sugarless gum or sucking hard candy, and drinking plenty of water will help. The use of this medicine may increase the chance of suicidal thoughts or actions. Pay special attention to how you are responding while on this medicine. Any worsening of mood, or thoughts of suicide or dying should be reported to your health care professional right away. Women who become pregnant while using this medicine may enroll in the [place] American Antiepileptic Drug Pregnancy Registry by calling [phone]. This registry collects information about the safety of antiepileptic drug use during pregnancy. What side effects may I notice from receiving this medicine? Side effects that you should report to your doctor or health care professional as soon as possible: -allergic reactions like skin rash, itching or hives, swelling of the face, lips, or tongue -worsening of mood, thoughts or actions of suicide or dying Side effects that usually do not require medical attention (report to your doctor or health care professional if they continue or are bothersome): -constipation -difficulty walking or controlling muscle movements -dizziness -nausea -slurred speech -tiredness -tremors -weight gain This list may not describe all possible side effects. Call your doctor for medical advice about side effects. You may report side effects to FDA at 1-800-FDA-1088. Where should I keep my medicine? Keep out of reach of children. This medicine may cause accidental overdose and death if it taken by other adults, children, or pets. Mix any unused medicine with a substance like cat litter or coffee grounds. Then throw the medicine away in a sealed container like a sealed bag or a coffee can with a lid. Do not use the medicine after the expiration date. Store at room temperature between 15 and 30 degrees C (59 and 86 degrees F). NOTE: This  sheet is a summary. It may not cover all possible information. If you have questions about this medicine, talk to your doctor, pharmacist, or health care provider.  2018 Elsevier/Gold Standard (2013-12-28 15:26:50) Sulfamethoxazole; Trimethoprim, SMX-TMP tablets What is this medicine? SULFAMETHOXAZOLE; TRIMETHOPRIM or SMX-TMP (suhl fuh meth OK suh zohl; trye METH oh prim) is a combination of a sulfonamide antibiotic and a second antibiotic, trimethoprim. It is used to treat or prevent certain kinds of bacterial infections. It will not work for colds, flu, or other viral infections. This medicine may be used for other purposes; ask your health care provider or pharmacist if you have questions. COMMON BRAND NAME(S): Bacter-Aid DS, Bactrim, Bactrim DS, Septra, Septra DS What should I tell my health care provider before I take this medicine? They need to know if you have any of these conditions: -anemia -asthma -being treated with anticonvulsants -if you frequently drink alcohol containing drinks -kidney disease -liver disease -low level of folic acid or [id number]-6-phosphate dehydrogenase -poor nutrition or malabsorption -porphyria -severe allergies -thyroid disorder -an unusual or allergic reaction to sulfamethoxazole, trimethoprim, sulfa drugs, other medicines, foods, dyes, or preservatives -pregnant or trying to get pregnant -breast-feeding How should I use this medicine? Take this medicine by mouth with a full glass of water. Follow the directions on the prescription label. Take your medicine at regular intervals. Do not take it more often than directed. Do not  skip doses or stop your medicine early. Talk to your pediatrician regarding the use of this medicine in children. Special care may be needed. This medicine has been used in children as young as 69 months of age. Overdosage: If you think you have taken too much of this medicine contact a poison control center or emergency room at  once. NOTE: This medicine is only for you. Do not share this medicine with others. What if I miss a dose? If you miss a dose, take it as soon as you can. If it is almost time for your next dose, take only that dose. Do not take double or extra doses. What may interact with this medicine? Do not take this medicine with any of the following medications: -aminobenzoate potassium -dofetilide -metronidazole This medicine may also interact with the following medications: -ACE inhibitors like benazepril, enalapril, lisinopril, and ramipril -birth control pills -cyclosporine -digoxin -diuretics -indomethacin -medicines for diabetes -methenamine -methotrexate -phenytoin -potassium supplements -pyrimethamine -sulfinpyrazone -tricyclic antidepressants -warfarin This list may not describe all possible interactions. Give your health care provider a list of all the medicines, herbs, non-prescription drugs, or dietary supplements you use. Also tell them if you smoke, drink alcohol, or use illegal drugs. Some items may interact with your medicine. What should I watch for while using this medicine? Tell your doctor or health care professional if your symptoms do not improve. Drink several glasses of water a day to reduce the risk of kidney problems. Do not treat diarrhea with over the counter products. Contact your doctor if you have diarrhea that lasts more than 2 days or if it is severe and watery. This medicine can make you more sensitive to the sun. Keep out of the sun. If you cannot avoid being in the sun, wear protective clothing and use a sunscreen. Do not use sun lamps or tanning beds/booths. What side effects may I notice from receiving this medicine? Side effects that you should report to your doctor or health care professional as soon as possible: -allergic reactions like skin rash or hives, swelling of the face, lips, or tongue -breathing problems -fever or chills, sore  throat -irregular heartbeat, chest pain -joint or muscle pain -pain or difficulty passing urine -red pinpoint spots on skin -redness, blistering, peeling or loosening of the skin, including inside the mouth -unusual bleeding or bruising -unusually weak or tired -yellowing of the eyes or skin Side effects that usually do not require medical attention (report to your doctor or health care professional if they continue or are bothersome): -diarrhea -dizziness -headache -loss of appetite -nausea, vomiting -nervousness This list may not describe all possible side effects. Call your doctor for medical advice about side effects. You may report side effects to FDA at 1-800-FDA-1088. Where should I keep my medicine? Keep out of the reach of children. Store at room temperature between 20 to 25 degrees C (68 to 77 degrees F). Protect from light. Throw away any unused medicine after the expiration date. NOTE: This sheet is a summary. It may not cover all possible information. If you have questions about this medicine, talk to your doctor, pharmacist, or health care provider.  2018 Elsevier/Gold Standard (2013-06-08 14:38:26)

## 2018-06-02 LAB — URINE CULTURE

## 2018-06-03 ENCOUNTER — Emergency Department (HOSPITAL_COMMUNITY): Payer: Medicaid Other

## 2018-06-03 ENCOUNTER — Other Ambulatory Visit: Payer: Self-pay

## 2018-06-03 ENCOUNTER — Emergency Department (HOSPITAL_COMMUNITY)
Admission: EM | Admit: 2018-06-03 | Discharge: 2018-06-03 | Disposition: A | Discharge status: Home / Self Care | Payer: Medicaid Other | Attending: Emergency Medicine | Admitting: Emergency Medicine

## 2018-06-03 ENCOUNTER — Encounter (HOSPITAL_COMMUNITY): Payer: Self-pay | Admitting: *Deleted

## 2018-06-03 DIAGNOSIS — W108XXA Fall (on) (from) other stairs and steps, initial encounter: Secondary | ICD-10-CM | POA: Insufficient documentation

## 2018-06-03 DIAGNOSIS — Z79899 Other long term (current) drug therapy: Secondary | ICD-10-CM | POA: Diagnosis not present

## 2018-06-03 DIAGNOSIS — M25572 Pain in left ankle and joints of left foot: Secondary | ICD-10-CM | POA: Diagnosis not present

## 2018-06-03 MED ORDER — ACETAMINOPHEN 325 MG PO TABS
650.0000 mg | ORAL_TABLET | Freq: Once | ORAL | Status: DC
  Filled 2018-06-03: qty 2

## 2018-06-03 MED ORDER — NAPROXEN 375 MG PO TABS: 375.0000 mg | ORAL_TABLET | Freq: Two times a day (BID) | ORAL | 0 refills | Status: DC

## 2018-06-03 NOTE — ED Provider Notes (Signed)
[facility]-EMERGENCY DEPT Provider Note   CSN: [id number] Arrival date & time: 06/03/18  [id number]     History   Chief Complaint Chief Complaint  Patient presents with  . Ankle Pain    HPI Felicia Collier is a 62 y.o. female with history of chronic pain, schizophrenia, GERD who presents emergency department today for left ankle pain.  Patient reports yesterday evening she was walking down her stairs when she took a misstep during the last 1-2 steps.  She reports that she inverted her left ankle which caused her to fall onto her left knee.  She denies any head trauma or loss of consciousness.  She denies any low back trauma.  No chest pain or shortness of breath.  No abdominal pain.  Since then she has been having pain on the lateral aspect of her ankle is worse with ambulation.  She has been using a crutch at home to help her distribute the weight.  She does not take any medications for this.  She denies any open wounds, numbness or tingling. No weakness.   HPI  Past Medical History:  Diagnosis Date  . Chronic back pain   . Chronic knee pain   . Schizophrenia [facility](HCC)     Patient Active Problem List   Diagnosis Date Noted  . Pain in left wrist 07/20/2017  . Schizophrenia (HCC) 05/01/2017  . Chronic pain of left knee 02/09/2017  . Unilateral primary osteoarthritis, left knee 02/09/2017  . Neuropathy 11/22/2016  . Gastroesophageal reflux disease without esophagitis 11/22/2016  . Weight gain 11/22/2016  . Atypical chest pain 11/04/2016    Past Surgical History:  Procedure Laterality Date  . ABDOMINAL HYSTERECTOMY    . CHOLECYSTECTOMY       OB History   None      Home Medications    Prior to Admission medications   Medication Sig Start Date End Date Taking? Authorizing Provider  acetaminophen (TYLENOL) 500 MG tablet Take 1 tablet (500 mg total) by mouth every 6 (six) hours as needed. Patient not taking: Reported on 05/31/2018 05/16/18   Fayrene Helperran, Bowie, PA-C    cetirizine-pseudoephedrine (ZYRTEC-D) 5-120 MG tablet Take 1 tablet by mouth daily. Patient not taking: Reported on 05/15/2018 05/03/18   Fayrene Helperran, Bowie, PA-C  gabapentin (NEURONTIN) 100 MG capsule Take 1 capsule (100 mg total) by mouth 3 (three) times daily. 05/31/18   Kallie LocksStroud, Natalie M, FNP  sulfamethoxazole-trimethoprim (BACTRIM DS,SEPTRA DS) 800-160 MG tablet Take 1 tablet by mouth 2 (two) times daily. 05/31/18   Kallie LocksStroud, Natalie M, FNP    Family History Family History  Problem Relation Age of Onset  . Other Mother        no health conditions per patient  . Other Father        no health conditions per patient    Social History Social History   Tobacco Use  . Smoking status: Never Smoker  . Smokeless tobacco: Never Used  Substance Use Topics  . Alcohol use: No  . Drug use: No     Allergies   Lidocaine   Review of Systems Review of Systems  All other systems reviewed and are negative.    Physical Exam Updated Vital Signs BP (!) 149/87 (BP Location: Left Arm)   Pulse 77   Temp 97.9 F (36.6 C) (Oral)   Resp 18   Ht 5\' 5"  (1.651 m)   Wt 75.8 kg (167 lb)   SpO2 99%   BMI 27.79 kg/m  Physical Exam  Constitutional: She appears well-developed and well-nourished.  HENT:  Head: Normocephalic and atraumatic.  Right Ear: External ear normal.  Left Ear: External ear normal.  Nose: Nose normal.  Mouth/Throat: Uvula is midline, oropharynx is clear and moist and mucous membranes are normal. No tonsillar exudate.  Eyes: Pupils are equal, round, and reactive to light. Right eye exhibits no discharge. Left eye exhibits no discharge. No scleral icterus.  Neck: Trachea normal. Neck supple. No spinous process tenderness present. No neck rigidity. Normal range of motion present.  No C-Spine ttp or step offs.   Cardiovascular: Normal rate, regular rhythm and intact distal pulses.  No murmur heard. Pulses:      Radial pulses are 2+ on the right side, and 2+ on the left side.        Dorsalis pedis pulses are 2+ on the right side, and 2+ on the left side.       Posterior tibial pulses are 2+ on the right side, and 2+ on the left side.  Pulmonary/Chest: Effort normal and breath sounds normal. She exhibits no tenderness.  Abdominal: Soft. Bowel sounds are normal. There is no tenderness. There is no rebound and no guarding.  Musculoskeletal: She exhibits no edema.       Right hip: Normal.       Left hip: Normal.       Left knee: She exhibits normal range of motion and no swelling. Tenderness found.       Left ankle: She exhibits normal range of motion, no swelling and no ecchymosis. Tenderness. Lateral malleolus tenderness found.       Thoracic back: Normal.       Lumbar back: Normal.       Left foot: Normal.  No thoracic or lumbar spinous ttp or step offs.  No leg shortening or external rotation.  Normal range of motion of the lower extremities.  Lymphadenopathy:    She has no cervical adenopathy.  Neurological: She is alert.  Skin: Skin is warm and dry. No rash noted. She is not diaphoretic.  Psychiatric: She has a normal mood and affect.  Nursing note and vitals reviewed.    ED Treatments / Results  Labs (all labs ordered are listed, but only abnormal results are displayed) Labs Reviewed - No data to display  EKG None  Radiology Dg Ankle Complete Left  Result Date: 06/03/2018 CLINICAL DATA:  Left foot and ankle pain and swelling following a fall last night. EXAM: LEFT ANKLE COMPLETE - 3+ VIEW COMPARISON:  Left foot radiographs obtained at the same time. FINDINGS: Diffuse soft tissue swelling. Talotibial spur formation. Distal lateral malleolus spur or fused accessory ossicle. No fracture, dislocation or effusion seen. Large inferior and small to moderate-sized posterior calcaneal spurs. IMPRESSION: 1. No fracture. 2. Degenerative changes. Electronically Signed   By: Beckie Salts M.D.   On: 06/03/2018 10:15   Dg Knee Complete 4 Views Left  Result Date:  06/03/2018 CLINICAL DATA:  Medial knee pain after fall last night. EXAM: LEFT KNEE - COMPLETE 4+ VIEW COMPARISON:  Left knee x-rays dated January 14, 2017. FINDINGS: No acute fracture or dislocation. No joint effusion. Small chronic osteochondral lesion of the medial femoral condyle is unchanged. Mild medial and patellofemoral compartment joint space narrowing with subchondral sclerosis and small marginal osteophytes, similar to prior study. Soft tissues are unremarkable. IMPRESSION: 1.  No acute osseous abnormality. 2. Unchanged medial and lateral compartment osteoarthritis with chronic medial femoral condyle osteochondral lesion. Electronically Signed  By: Obie Dredge M.D.   On: 06/03/2018 11:08   Dg Foot Complete Left  Result Date: 06/03/2018 CLINICAL DATA:  Pt fell last night and is c/o left foot pain and swelling. PT is limping. EXAM: LEFT FOOT - COMPLETE 3+ VIEW COMPARISON:  None. FINDINGS: There is no evidence of fracture or dislocation. Calcaneal spurs. Small dorsal spurs in the midfoot. There is no other focal bone abnormality. Soft tissues are unremarkable. IMPRESSION: 1. Negative for fracture or other acute bone abnormality. 2. Degenerative spurring as above Electronically Signed   By: Corlis Leak M.D.   On: 06/03/2018 10:14    Procedures Procedures (including critical care time)  Medications Ordered in ED Medications - No data to display   Initial Impression / Assessment and Plan / ED Course  I have reviewed the triage vital signs and the nursing notes.  Pertinent labs & imaging results that were available during my care of the patient were reviewed by me and considered in my medical decision making (see chart for details).     62 y.o. female with history of chronic pain, schizophrenia, GERD who presents emergency department today for left ankle pain.  Patient reports yesterday evening she was walking down her stairs when she took a misstep during the last 1-2 steps.  She reports  that she inverted her left ankle which caused her to fall onto her left knee.  She denies any head trauma or loss of consciousness.  She denies any low back trauma.  No chest pain or shortness of breath.  No abdominal pain.  Since then she has been having pain on the lateral aspect of her ankle is worse with ambulation.  She has been using a crutch at home to help her distribute the weight.  She does not take any medications for this.  She denies any open wounds, numbness or tingling. No weakness. Exam as above. She is NVI. No weakness. No open wounds. Patient X-Ray negative for obvious fracture or dislocation. Pt advised to follow up with orthopedics if symptoms persist for possibility of missed fracture diagnosis. Patient given brace while in ED, conservative therapy recommended and discussed. Patient already has crutches. Strict return precautions discussed. Patient will be dc home & is agreeable with above plan.  Final Clinical Impressions(s) / ED Diagnoses   Final diagnoses:  Acute left ankle pain    ED Discharge Orders        Ordered    naproxen (NAPROSYN) 375 MG tablet  2 times daily     06/03/18 1234       Princella Pellegrini 06/04/18 1610    Little, Ambrose Finland, MD 06/04/18 1022

## 2018-06-03 NOTE — ED Triage Notes (Signed)
Pt states she fell down the stairs last night and injured left ankle. Pt states she misstepped going down the stairs. Pt denies LOC and hitting head. Pt ambulatory using 1 crutch in triage. 

## 2018-06-03 NOTE — Discharge Instructions (Signed)
Please read and follow all provided instructions.  Your diagnoses today include: Ankle sprain An ankle sprain is an injury to the ligaments that hold the ankle joint together causing them to get stretched or torn. It may take 4-6 weeks to heal fully. Your X-ray today showed no evidence of fracture (there is no evidence of broken bones).  For activity: Use crutches with non-weightbearing for the first few days. Exercises should be limited to pain free range of motion. You can start mobilization by tracing the alphabet with you foot in the air. Then, you may walk on your ankles as the pain allows (or as instructed). Start gradually with weight bearing on the affected ankle. Once you can walk pain free, then try jogging. When you can run forwards, then you can try moving side to side. If you cannot walk without crutches in one week, you need a recheck by your Family Doctor. It is important to keep all follow-up appointments as we discussed fractures may not appear until 1 week to 10 days after the acute injury. If you do not have a family doctor to follow-up with, you can see the list of phone numbers below. Please call today to make a followup appointment.  TREATMENT  Rest, ice, compression and elevation (RICE therapy) are the basic modes of treatment.   Rest is needed to allow your body to heal. Routine activities can be resumed when comfortable (as described above). Injury tendons and bones can take up to 6 weeks to heal. Tendons are cordlike structures that attach muscles and bones Ice: Apply ice to the sore area for 15 to 20 minutes, 3 to 4 times per day. Do this while you are awake for the first 2 days, or as directed. This can help reduce swelling and reduce pain.  Compression: this helps keep swelling down. It also gives support and helps with discomfort. If any lasting bandage has been applied, it should be removed and reapplied every 3-4 hours. It should not be applied tightly, but firmly enough to  keep swelling down. Watch fingers or toes for swelling, discoloration, coldness, numbness or excessive pain. If any of these problems occur, removed the bandage and reapply loosely. Contact your caregiver if these problems continue. If you were given an ankle stabilizer you may take it off at night and to take a shower or bath. Wiggle your toes in the splint several times per day if you are able.  Elevation helps reduce swelling and decrease your pain. With extremities such as the arms, hands, legs and feet, the injured area should be placed near or above the level of the heart if possible (place pillows underneath you leg/foot while you sleep to achieve this).  HOW TO MAKE AN ICE PACK  To make an ice pack, do one of the following:  Place crushed ice or a bag of frozen vegetables in a sealable plastic bag. Squeeze out the excess air. Place this bag inside another plastic bag. Slide the bag into a pillowcase or place a damp towel between your skin and the bag.  Mix 3 parts water with 1 part rubbing alcohol. Freeze the mixture in a sealable plastic bag. When you remove the mixture from the freezer, it will be slushy. Squeeze out the excess air. Place this bag inside another plastic bag. Slide the bag into a pillowcase or place a damp towel between your sk  Seek immediate medical attention if: You're toes are numb or tingling, appear gray or blue, or  you have severe pain. If this occurs please also elevate the leg and loosen the splint. Also if you have persistent pain and swelling, developed redness numbness or unexpected weakness, or your symptoms are getting worse rather than improving after several days. The symptoms may indicate that further evaluation or further x-rays are needed. Sometimes, x-rays may not show a small broken bone until one week or 10 days later. Make a follow-up appointment with your caregiver.  Additional Information:  Your vital signs today were: BP (!) 149/87 (BP Location: Left  Arm)    Pulse 77    Temp 97.9 F (36.6 C) (Oral)    Resp 18    Ht 5\' 5"  (1.651 m)    Wt 75.8 kg (167 lb)    SpO2 99%    BMI 27.79 kg/m  If your blood pressure (BP) was elevated above 135/85 this visit, please have this repeated by your doctor within one month. ---------------

## 2018-06-05 ENCOUNTER — Other Ambulatory Visit: Payer: Self-pay | Admitting: Family Medicine

## 2018-06-08 ENCOUNTER — Other Ambulatory Visit: Payer: Self-pay

## 2018-06-08 ENCOUNTER — Encounter (HOSPITAL_COMMUNITY): Payer: Self-pay

## 2018-06-08 ENCOUNTER — Emergency Department (HOSPITAL_COMMUNITY)
Admission: EM | Admit: 2018-06-08 | Discharge: 2018-06-08 | Disposition: A | Discharge status: Home / Self Care | Payer: Medicaid Other | Attending: Emergency Medicine | Admitting: Emergency Medicine

## 2018-06-08 DIAGNOSIS — W109XXA Fall (on) (from) unspecified stairs and steps, initial encounter: Secondary | ICD-10-CM | POA: Diagnosis not present

## 2018-06-08 DIAGNOSIS — M79652 Pain in left thigh: Secondary | ICD-10-CM | POA: Diagnosis present

## 2018-06-08 DIAGNOSIS — Y929 Unspecified place or not applicable: Secondary | ICD-10-CM | POA: Diagnosis not present

## 2018-06-08 DIAGNOSIS — G8911 Acute pain due to trauma: Secondary | ICD-10-CM | POA: Insufficient documentation

## 2018-06-08 DIAGNOSIS — Y999 Unspecified external cause status: Secondary | ICD-10-CM | POA: Diagnosis not present

## 2018-06-08 DIAGNOSIS — S7012XA Contusion of left thigh, initial encounter: Secondary | ICD-10-CM | POA: Insufficient documentation

## 2018-06-08 DIAGNOSIS — Y939 Activity, unspecified: Secondary | ICD-10-CM | POA: Diagnosis not present

## 2018-06-08 DIAGNOSIS — M79605 Pain in left leg: Secondary | ICD-10-CM

## 2018-06-08 NOTE — Discharge Instructions (Signed)
Can take Tylenol or Motrin as needed for pain.  Can apply ice to the affected area. Follow-up with your primary care doctor. Return here for any new or worsening symptoms.

## 2018-06-08 NOTE — ED Provider Notes (Signed)
[facility] Provider Note   CSN: [id number] Arrival date & time: 06/08/18  0320     History   Chief Complaint Chief Complaint  Patient presents with  . Leg Pain    HPI Felicia Collier is a 62 y.o. female.  The history is provided by the patient and medical records.  Leg Pain      62 y.o. F with hx of schizophrenia, chronic joint pain due to arthritis, presenting to the ED with left leg pain.  Reports she fell on some steps a few days ago and has a large bruise to left posterior thigh.  States she just wanted to get this checked out to make sure it is okay.  States her leg has felt quite sore, especially but she is not limping and leg is not giving out on her.  She denies any numbness or weakness of her leg.  Reports she does have some chronic pain in her legs from her arthritis which she thinks has been made worse since the fall.  She has been taking her home Naprosyn with some relief.  Past Medical History:  Diagnosis Date  . Chronic back pain   . Chronic knee pain   . Schizophrenia [facility])     Patient Active Problem List   Diagnosis Date Noted  . Pain in left wrist 07/20/2017  . Schizophrenia (HCC) 05/01/2017  . Chronic pain of left knee 02/09/2017  . Unilateral primary osteoarthritis, left knee 02/09/2017  . Neuropathy 11/22/2016  . Gastroesophageal reflux disease without esophagitis 11/22/2016  . Weight gain 11/22/2016  . Atypical chest pain 11/04/2016    Past Surgical History:  Procedure Laterality Date  . ABDOMINAL HYSTERECTOMY    . CHOLECYSTECTOMY       OB History   None      Home Medications    Prior to Admission medications   Medication Sig Start Date End Date Taking? Authorizing Provider  acetaminophen (TYLENOL) 500 MG tablet Take 1 tablet (500 mg total) by mouth every 6 (six) hours as needed. Patient not taking: Reported on 05/31/2018 05/16/18   Fayrene Helper, PA-C  cetirizine-pseudoephedrine (ZYRTEC-D) 5-120 MG  tablet Take 1 tablet by mouth daily. Patient not taking: Reported on 05/15/2018 05/03/18   Fayrene Helper, PA-C  gabapentin (NEURONTIN) 100 MG capsule Take 1 capsule (100 mg total) by mouth 3 (three) times daily. 05/31/18   Kallie Locks, FNP  naproxen (NAPROSYN) 375 MG tablet Take 1 tablet (375 mg total) by mouth 2 (two) times daily. 06/03/18   Maczis, Elmer Sow, PA-C  sulfamethoxazole-trimethoprim (BACTRIM DS,SEPTRA DS) 800-160 MG tablet Take 1 tablet by mouth 2 (two) times daily. 05/31/18   Kallie Locks, FNP    Family History Family History  Problem Relation Age of Onset  . Other Mother        no health conditions per patient  . Other Father        no health conditions per patient    Social History Social History   Tobacco Use  . Smoking status: Never Smoker  . Smokeless tobacco: Never Used  Substance Use Topics  . Alcohol use: No  . Drug use: No     Allergies   Lidocaine   Review of Systems Review of Systems  Musculoskeletal: Positive for arthralgias.  All other systems reviewed and are negative.    Physical Exam Updated Vital Signs BP (!) 151/80 (BP Location: Left Arm)   Pulse 67   Temp 97.7 F (  36.5 C) (Oral)   Resp 18   Ht 5\' 5"  (1.651 m)   Wt 75.8 kg (167 lb)   SpO2 98%   BMI 27.79 kg/m   Physical Exam  Constitutional: She is oriented to person, place, and time. She appears well-developed and well-nourished.  HENT:  Head: Normocephalic and atraumatic.  Mouth/Throat: Oropharynx is clear and moist.  Eyes: Pupils are equal, round, and reactive to light. Conjunctivae and EOM are normal.  Neck: Normal range of motion.  Cardiovascular: Normal rate, regular rhythm and normal heart sounds.  Pulmonary/Chest: Effort normal and breath sounds normal.  Abdominal: Soft. Bowel sounds are normal.  Musculoskeletal: Normal range of motion.  Moderate sized bruised to left posterior thigh; no swelling or bony deformity; full ROM of left hip, knee, ankle, and all  toes; normal distal sensation and perfusion; normal gait  Neurological: She is alert and oriented to person, place, and time.  Skin: Skin is warm and dry.  Psychiatric: She has a normal mood and affect.  Nursing note and vitals reviewed.    ED Treatments / Results  Labs (all labs ordered are listed, but only abnormal results are displayed) Labs Reviewed - No data to display  EKG None  Radiology No results found.  Procedures Procedures (including critical care time)  Medications Ordered in ED Medications - No data to display   Initial Impression / Assessment and Plan / ED Course  I have reviewed the triage vital signs and the nursing notes.  Pertinent labs & imaging results that were available during my care of the patient were reviewed by me and considered in my medical decision making (see chart for details).  62 year old female here with left leg pain after a fall on steps a few days ago.  No head injury or loss of consciousness.  Has moderate-sized bruise to left posterior thigh.  No bony deformity.  Full range of motion of entire left leg, ambulatory with steady gait.  Suspect contusion.  Discussed supportive care measures including ice and continuing NSAIDs that she is already on.  We will have her follow-up closely with her primary care doctor.  Return here for any new or acute changes.  Final Clinical Impressions(s) / ED Diagnoses   Final diagnoses:  Left leg pain    ED Discharge Orders    None       Garlon HatchetSanders, Silvestre Mines M, PA-C 06/08/18 0546    Azalia Bilisampos, Kevin, MD 06/08/18 [phone]

## 2018-06-08 NOTE — ED Triage Notes (Signed)
Pt presents to ED from home for L leg pain and bruising. Pt had fall down stairs on Friday. Pt reports that she noticed a bruise to her L thigh yesterday. Pt having pain in whole leg.

## 2018-06-12 ENCOUNTER — Ambulatory Visit (INDEPENDENT_AMBULATORY_CARE_PROVIDER_SITE_OTHER): Payer: Medicaid Other | Admitting: Family Medicine

## 2018-06-12 ENCOUNTER — Encounter: Payer: Self-pay | Admitting: Family Medicine

## 2018-06-12 VITALS — BP 128/82 | HR 60 | Temp 97.8°F | Ht 65.0 in | Wt 166.0 lb

## 2018-06-12 DIAGNOSIS — Z9181 History of falling: Secondary | ICD-10-CM

## 2018-06-12 DIAGNOSIS — N390 Urinary tract infection, site not specified: Secondary | ICD-10-CM

## 2018-06-12 DIAGNOSIS — T148XXA Other injury of unspecified body region, initial encounter: Secondary | ICD-10-CM

## 2018-06-12 DIAGNOSIS — R8290 Unspecified abnormal findings in urine: Secondary | ICD-10-CM

## 2018-06-12 DIAGNOSIS — R109 Unspecified abdominal pain: Secondary | ICD-10-CM | POA: Diagnosis not present

## 2018-06-12 DIAGNOSIS — Z09 Encounter for follow-up examination after completed treatment for conditions other than malignant neoplasm: Secondary | ICD-10-CM

## 2018-06-12 LAB — POCT URINALYSIS DIP (MANUAL ENTRY)
Bilirubin, UA: NEGATIVE
Blood, UA: NEGATIVE
Glucose, UA: NEGATIVE mg/dL
Ketones, POC UA: NEGATIVE mg/dL
Nitrite, UA: NEGATIVE
Protein Ur, POC: NEGATIVE mg/dL
Spec Grav, UA: 1.01 (ref 1.010–1.025)
Urobilinogen, UA: 0.2 E.U./dL
pH, UA: 6.5 (ref 5.0–8.0)

## 2018-06-12 MED ORDER — NITROFURANTOIN MONOHYD MACRO 100 MG PO CAPS: 100.0000 mg | ORAL_CAPSULE | Freq: Two times a day (BID) | ORAL | 0 refills | Status: AC

## 2018-06-12 NOTE — Progress Notes (Addendum)
Hospital Follow Up  Subjective:    Patient ID: Felicia Collier, female    DOB: [birth year], 62 y.o.   MRN: [id number]   PCP: Felicia Ip, NP  HPI  Felicia Collier has a past medical history of Schizophrenia, Chronic Knee Pain, and Chronic Back Pain.   Current Status: Since her last office visit, she has had a recent hospital visit on 06/08/2018 for left leg pain from a recent fall on her right side and twisted her left ankle and bruised her posterior upper left thigh. She has been taking Naproxen and states that it is effective.  Reports dark urine and pain in lower abdomen when urinating.   She is doing well and feels that her . She denies fevers, chills, fatigue, recent infections, weight loss, and night sweats. She has not had any headaches, visual changes, dizziness, and falls. No chest pain, heart palpitations, and shortness of breath reported. No reports of GI problems such as nausea, vomiting, diarrhea, and constipation. She has no reports of blood in stools, and hematuria. No depression or anxiety, and denies suicidal ideations, homicidal ideations, or auditory hallucinations. She has mild back and joint pain today.   Past Medical History:  Diagnosis Date  . Chronic back pain   . Chronic knee pain   . Schizophrenia (HCC)     Family History  Problem Relation Age of Onset  . Other Mother        no health conditions per patient  . Other Father        no health conditions per patient    Social History   Socioeconomic History  . Marital status: Single    Spouse name: Not on file  . Number of children: Not on file  . Years of education: Not on file  . Highest education level: Not on file  Occupational History  . Not on file  Social Needs  . Financial resource strain: Not on file  . Food insecurity:    Worry: Not on file    Inability: Not on file  . Transportation needs:    Medical: Not on file    Non-medical: Not on file  Tobacco Use  . Smoking status: Never Smoker  .  Smokeless tobacco: Never Used  Substance and Sexual Activity  . Alcohol use: No  . Drug use: No  . Sexual activity: Not Currently  Lifestyle  . Physical activity:    Days per week: Not on file    Minutes per session: Not on file  . Stress: Not on file  Relationships  . Social connections:    Talks on phone: Not on file    Gets together: Not on file    Attends religious service: Not on file    Active member of club or organization: Not on file    Attends meetings of clubs or organizations: Not on file    Relationship status: Not on file  . Intimate partner violence:    Fear of current or ex partner: Not on file    Emotionally abused: Not on file    Physically abused: Not on file    Forced sexual activity: Not on file  Other Topics Concern  . Not on file  Social History Narrative  . Not on file    Past Surgical History:  Procedure Laterality Date  . ABDOMINAL HYSTERECTOMY    . CHOLECYSTECTOMY     Immunization History  Administered Date(s) Administered  . Influenza,inj,Quad PF,6+ Mos 09/26/2017  . Pneumococcal Conjugate-13  12/13/2016  . Tdap 05/24/2016    Current Meds  Medication Sig  . gabapentin (NEURONTIN) 100 MG capsule Take 1 capsule (100 mg total) by mouth 3 (three) times daily.  . naproxen (NAPROSYN) 375 MG tablet Take 1 tablet (375 mg total) by mouth 2 (two) times daily.   Allergies  Allergen Reactions  . Lidocaine     Face swelling     BP 128/82 (BP Location: Left Arm, Patient Position: Sitting, Cuff Size: Small)   Pulse 60   Temp 97.8 F (36.6 C) (Oral)   Ht 5\' 5"  (1.651 m)   Wt 166 lb (75.3 kg)   SpO2 98%   BMI 27.62 kg/m    Review of Systems  Constitutional: Negative.   HENT: Negative.   Eyes: Negative.   Respiratory: Positive for cough (occasional).   Cardiovascular: Negative.   Gastrointestinal: Positive for abdominal distention (obese) and abdominal pain (mild ).  Endocrine: Negative.   Genitourinary: Negative.   Musculoskeletal:  Positive for arthralgias (knee) and back pain.  Skin: Positive for color change (healing bruising thigh).  Allergic/Immunologic: Negative.   Neurological: Positive for headaches (occasional).  Hematological: Negative.   Psychiatric/Behavioral: Negative.    Objective:   Physical Exam  Constitutional: She is oriented to person, place, and time. She appears well-developed and well-nourished.  HENT:  Head: Normocephalic and atraumatic.  Right Ear: External ear normal.  Left Ear: External ear normal.  Nose: Nose normal.  Mouth/Throat: Oropharynx is clear and moist.  Eyes: Pupils are equal, round, and reactive to light. Conjunctivae and EOM are normal.  Neck: Normal range of motion. Neck supple.  Cardiovascular: Normal rate, regular rhythm, normal heart sounds and intact distal pulses.  Pulmonary/Chest: Effort normal and breath sounds normal.  Abdominal: She exhibits distension (Obese).  Musculoskeletal: Normal range of motion.  Neurological: She is alert and oriented to person, place, and time.  Skin: Skin is warm and dry. Capillary refill takes less than 2 seconds. There is erythema (healing bruise on posterior upper thigh.).     Psychiatric: She has a normal mood and affect. Her behavior is normal. Judgment and thought content normal.  Nursing note and vitals reviewed.   Assessment & Plan:   1. History of recent fall She has not had any falls since. Naproxen is working well for discomfort.   2. Bruise Improved since fall. Mild soreness.   3. Bilateral flank pain Urinalysis revealed UTI. We will send Rx for antibiotic to pharmacy.  - POCT urinalysis dipstick  4. Urinary tract infection without hematuria, site unspecified - nitrofurantoin, macrocrystal-monohydrate, (MACROBID) 100 MG capsule; Take 1 capsule (100 mg total) by mouth 2 (two) times daily for 10 days.  Dispense: 14 capsule; Refill: 0  5. Abnormal urinalysis - Urine Culture  6. Follow up She will keep scheduled  follow up on 06/29/2018.  Meds ordered this encounter  Medications  . nitrofurantoin, macrocrystal-monohydrate, (MACROBID) 100 MG capsule    Sig: Take 1 capsule (100 mg total) by mouth 2 (two) times daily for 10 days.    Dispense:  14 capsule    Refill:  0   Felicia IpNatalie Lanice Folden,  MSN, FNP-C Patient [facility] [address]  [place], [place] [address] [phone]

## 2018-06-14 LAB — URINE CULTURE

## 2018-06-15 ENCOUNTER — Other Ambulatory Visit: Payer: Self-pay

## 2018-06-15 ENCOUNTER — Encounter (HOSPITAL_COMMUNITY): Payer: Self-pay | Admitting: Emergency Medicine

## 2018-06-15 ENCOUNTER — Emergency Department (HOSPITAL_COMMUNITY)
Admission: EM | Admit: 2018-06-15 | Discharge: 2018-06-16 | Disposition: A | Discharge status: Home / Self Care | Payer: Medicaid Other | Attending: Emergency Medicine | Admitting: Emergency Medicine

## 2018-06-15 ENCOUNTER — Ambulatory Visit: Payer: Self-pay | Admitting: Clinical

## 2018-06-15 DIAGNOSIS — M5412 Radiculopathy, cervical region: Secondary | ICD-10-CM | POA: Insufficient documentation

## 2018-06-15 DIAGNOSIS — M542 Cervicalgia: Secondary | ICD-10-CM | POA: Diagnosis present

## 2018-06-15 NOTE — ED Triage Notes (Signed)
Pt from home with c/o pain in arm and neck. Pt states pain shoots down arm from neck. Pt denies injury or fall. Pt states pain is 9/10 and present x 2 days.

## 2018-06-16 MED ORDER — DEXAMETHASONE SODIUM PHOSPHATE 10 MG/ML IJ SOLN
10.0000 mg | Freq: Once | INTRAMUSCULAR | Status: AC
  Administered 2018-06-16: 10 mg via INTRAMUSCULAR
  Filled 2018-06-16: qty 1

## 2018-06-16 MED ORDER — NAPROXEN 375 MG PO TABS
375.0000 mg | ORAL_TABLET | Freq: Once | ORAL | Status: AC
  Administered 2018-06-16: 375 mg via ORAL
  Filled 2018-06-16: qty 1

## 2018-06-16 MED ORDER — NAPROXEN 375 MG PO TABS: 375.0000 mg | ORAL_TABLET | Freq: Two times a day (BID) | ORAL | 0 refills | Status: DC

## 2018-06-16 NOTE — Discharge Instructions (Addendum)
We saw in the ER for pain in your arms.  The pain appears to be starting from your neck.  You have known history of arthritis of your neck, which we think is contributing to your symptoms.  Please take the medications prescribed. See her primary care doctor in 1 week.  Call the neurologist if you are noticing increasing and tingling in your arm or you start developing weakness in your arm or difficulty in gripping things.

## 2018-06-21 NOTE — ED Provider Notes (Signed)
[facility]-EMERGENCY DEPT Provider Note   CSN: [id number] Arrival date & time: 06/15/18  2119     History   Chief Complaint Chief Complaint  Patient presents with  . Neck Pain  . Arm Pain    HPI Felicia Collier is a 62 y.o. female.  HPI 62 year old female with history of chronic back pain and schizophrenia comes in with chief complaint of pain in her arm and neck.  Patient states that she started having pain shooting down both her arms yesterday.  Pain is described as sharp and at 9 out of 10.  She denies any associated numbness, tingling, severe weakness.  Patient denies any recent trauma.  She has had neck pain in the past, but does not recall having any tingling. Pt has no associated weakness, urinary incontinence, urinary retention, bowel incontinence, pins and needle sensation in the perineal area.    Past Medical History:  Diagnosis Date  . Chronic back pain   . Chronic knee pain   . Schizophrenia [facility])     Patient Active Problem List   Diagnosis Date Noted  . Pain in left wrist 07/20/2017  . Schizophrenia (HCC) 05/01/2017  . Chronic pain of left knee 02/09/2017  . Unilateral primary osteoarthritis, left knee 02/09/2017  . Neuropathy 11/22/2016  . Gastroesophageal reflux disease without esophagitis 11/22/2016  . Weight gain 11/22/2016  . Atypical chest pain 11/04/2016    Past Surgical History:  Procedure Laterality Date  . ABDOMINAL HYSTERECTOMY    . CHOLECYSTECTOMY       OB History   None      Home Medications    Prior to Admission medications   Medication Sig Start Date End Date Taking? Authorizing Provider  gabapentin (NEURONTIN) 100 MG capsule Take 1 capsule (100 mg total) by mouth 3 (three) times daily. 05/31/18   Kallie Locks, FNP  naproxen (NAPROSYN) 375 MG tablet Take 1 tablet (375 mg total) by mouth 2 (two) times daily. 06/16/18   Derwood Kaplan, MD  nitrofurantoin, macrocrystal-monohydrate, (MACROBID) 100 MG capsule  Take 1 capsule (100 mg total) by mouth 2 (two) times daily for 10 days. 06/12/18 06/22/18  Kallie Locks, FNP  sulfamethoxazole-trimethoprim (BACTRIM DS,SEPTRA DS) 800-160 MG tablet Take 1 tablet by mouth 2 (two) times daily. Patient not taking: Reported on 06/12/2018 05/31/18   Kallie Locks, FNP    Family History Family History  Problem Relation Age of Onset  . Other Mother        no health conditions per patient  . Other Father        no health conditions per patient    Social History Social History   Tobacco Use  . Smoking status: Never Smoker  . Smokeless tobacco: Never Used  Substance Use Topics  . Alcohol use: No  . Drug use: No     Allergies   Lidocaine   Review of Systems Review of Systems  Constitutional: Positive for activity change.  Cardiovascular: Negative for chest pain.  Musculoskeletal: Positive for neck pain.  Allergic/Immunologic: Negative for immunocompromised state.  Neurological: Positive for numbness.  Hematological: Does not bruise/bleed easily.     Physical Exam Updated Vital Signs BP 139/80   Pulse 76   Temp 98.2 F (36.8 C) (Oral)   Resp 18   SpO2 98%   Physical Exam  Constitutional: She is oriented to person, place, and time. She appears well-developed.  HENT:  Head: Normocephalic and atraumatic.  Eyes: EOM are normal.  Neck: Normal range of motion. Neck supple.  + mild c-spine tenderness but pt able to turn head to 45 degrees bilaterally without any pain and able to flex neck to the chest and extend without any significant worsening of pain or neurologic symptoms.   Cardiovascular: Normal rate.  Pulmonary/Chest: Effort normal.  Abdominal: Bowel sounds are normal.  Neurological: She is alert and oriented to person, place, and time. No cranial nerve deficit. Coordination normal.  Gross sensory exam is normal and patient is able to discriminate between sharp and dull.  Upper extremity strength is 4+ out of 5 bilaterally.    Skin: Skin is warm and dry.  Nursing note and vitals reviewed.    ED Treatments / Results  Labs (all labs ordered are listed, but only abnormal results are displayed) Labs Reviewed - No data to display  EKG None  Radiology No results found.  Procedures Procedures (including critical care time)  Medications Ordered in ED Medications  dexamethasone (DECADRON) injection 10 mg (10 mg Intramuscular Given 06/16/18 0040)  naproxen (NAPROSYN) tablet 375 mg (375 mg Oral Given 06/16/18 0039)     Initial Impression / Assessment and Plan / ED Course  I have reviewed the triage vital signs and the nursing notes.  Pertinent labs & imaging results that were available during my care of the patient were reviewed by me and considered in my medical decision making (see chart for details).    62 year old female comes in with chief complaint of bilateral upper extremity pain.  Patient does not have any associated numbness, she does not have any red flags indicating cord compression at this time.  Patient does have neck pain, and CT scan from 03/04/2018 was reviewed, that CT scan showed multiple areas of disc bulging and at the level of C4-C5 she had right-sided neural foramen stenosis and nerve impingement.  It seems like patient's symptoms are because of nerve root impingement, likely due to worsening degenerative disc disease.  We have advised her to follow-up with her PCP as soon as possible. She will get a dose of steroids while in the ED.. ER return precautions have been discussed with the patient.   Final Clinical Impressions(s) / ED Diagnoses   Final diagnoses:  Cervical radiculopathy    ED Discharge Orders        Ordered    naproxen (NAPROSYN) 375 MG tablet  2 times daily     06/16/18 0043       Derwood KaplanNanavati, Aidynn Polendo, MD 06/21/18 1333

## 2018-06-24 ENCOUNTER — Emergency Department (HOSPITAL_COMMUNITY)
Admission: EM | Admit: 2018-06-24 | Discharge: 2018-06-24 | Disposition: A | Discharge status: Home / Self Care | Payer: Medicaid Other | Attending: Emergency Medicine | Admitting: Emergency Medicine

## 2018-06-24 ENCOUNTER — Encounter (HOSPITAL_COMMUNITY): Payer: Self-pay | Admitting: Emergency Medicine

## 2018-06-24 ENCOUNTER — Emergency Department (HOSPITAL_COMMUNITY): Payer: Medicaid Other

## 2018-06-24 ENCOUNTER — Other Ambulatory Visit: Payer: Self-pay

## 2018-06-24 DIAGNOSIS — M25532 Pain in left wrist: Secondary | ICD-10-CM | POA: Insufficient documentation

## 2018-06-24 NOTE — ED Triage Notes (Signed)
Pt presents with left arm pain. Pt states that it has hurt all week and is getting worse.

## 2018-06-24 NOTE — ED Provider Notes (Signed)
[facility]-EMERGENCY DEPT Provider Note   CSN: [id number] Arrival date & time: 06/24/18  [id number]     History   Chief Complaint Chief Complaint  Patient presents with  . Arm Pain    HPI Felicia Collier is a 62 y.o. female.  Patient presents to the emergency department with a chief complaint of left wrist pain.  She states that she fell remotely and has had some persistent pain in her wrist.  She has not had imaging done of her wrist.  She reports having a history of wrist pain and carpal tunnel symptoms and had injections performed at one point in the past.  She denies any fevers or chills.  Denies any other new symptoms tonight.  The history is provided by the patient. No language interpreter was used.    Past Medical History:  Diagnosis Date  . Chronic back pain   . Chronic knee pain   . Schizophrenia [facility](HCC)     Patient Active Problem List   Diagnosis Date Noted  . Pain in left wrist 07/20/2017  . Schizophrenia (HCC) 05/01/2017  . Chronic pain of left knee 02/09/2017  . Unilateral primary osteoarthritis, left knee 02/09/2017  . Neuropathy 11/22/2016  . Gastroesophageal reflux disease without esophagitis 11/22/2016  . Weight gain 11/22/2016  . Atypical chest pain 11/04/2016    Past Surgical History:  Procedure Laterality Date  . ABDOMINAL HYSTERECTOMY    . CHOLECYSTECTOMY    . WRIST SURGERY Left      OB History   None      Home Medications    Prior to Admission medications   Medication Sig Start Date End Date Taking? Authorizing Provider  naproxen (NAPROSYN) 375 MG tablet Take 1 tablet (375 mg total) by mouth 2 (two) times daily. Patient taking differently: Take 375 mg by mouth 2 (two) times daily as needed for moderate pain.  06/16/18  Yes Derwood KaplanNanavati, Ankit, MD  gabapentin (NEURONTIN) 100 MG capsule Take 1 capsule (100 mg total) by mouth 3 (three) times daily. Patient not taking: Reported on 06/24/2018 05/31/18   Kallie LocksStroud, Natalie M, FNP    sulfamethoxazole-trimethoprim (BACTRIM DS,SEPTRA DS) 800-160 MG tablet Take 1 tablet by mouth 2 (two) times daily. Patient not taking: Reported on 06/12/2018 05/31/18   Kallie LocksStroud, Natalie M, FNP    Family History Family History  Problem Relation Age of Onset  . Other Mother        no health conditions per patient  . Other Father        no health conditions per patient    Social History Social History   Tobacco Use  . Smoking status: Never Smoker  . Smokeless tobacco: Never Used  Substance Use Topics  . Alcohol use: No  . Drug use: No     Allergies   Lidocaine   Review of Systems Review of Systems  All other systems reviewed and are negative.    Physical Exam Updated Vital Signs BP 114/68 (BP Location: Left Arm)   Pulse 68   Temp 98.4 F (36.9 C) (Oral)   Resp 18   Ht 5\' 5"  (1.651 m)   Wt 75.8 kg   SpO2 97%   BMI 27.79 kg/m   Physical Exam  Constitutional: She is oriented to person, place, and time. She appears well-developed and well-nourished.  HENT:  Head: Normocephalic and atraumatic.  Eyes: Conjunctivae and EOM are normal.  Neck: Normal range of motion.  Cardiovascular: Normal rate and intact distal pulses.  Intact distal pulses with brisk capillary refill  Pulmonary/Chest: Effort normal.  Abdominal: She exhibits no distension.  Musculoskeletal: Normal range of motion.  Left wrist without bony abnormality or deformity, no focal tenderness  Neurological: She is alert and oriented to person, place, and time.  Skin: Skin is dry.  No evidence of erythema or cellulitis  Psychiatric: She has a normal mood and affect. Her behavior is normal. Judgment and thought content normal.  Nursing note and vitals reviewed.    ED Treatments / Results  Labs (all labs ordered are listed, but only abnormal results are displayed) Labs Reviewed - No data to display  EKG None  Radiology Dg Wrist Complete Left  Result Date: 06/24/2018 CLINICAL DATA:  Acute onset  of left wrist pain. Fall 1 month ago. Initial encounter. EXAM: LEFT WRIST - COMPLETE 3+ VIEW COMPARISON:  Left wrist radiographs performed 06/17/2016 FINDINGS: There is no evidence of fracture or dislocation. There is mildly worsening flattening of the lunate, with associated sclerosis. The joint spaces are preserved. No significant soft tissue abnormalities are seen. IMPRESSION: No evidence of fracture or dislocation. Mildly worsening flattening of the lunate, with associated sclerosis. This may simply reflect focal osteoarthritis, or Kienbock's disease. Electronically Signed   By: Roanna Raider M.D.   On: 06/24/2018 04:45    Procedures Procedures (including critical care time)  Medications Ordered in ED Medications - No data to display   Initial Impression / Assessment and Plan / ED Course  I have reviewed the triage vital signs and the nursing notes.  Pertinent labs & imaging results that were available during my care of the patient were reviewed by me and considered in my medical decision making (see chart for details).     Patient with left wrist pain.  There is no acute fracture seen on x-ray, but she does have some flattening of the lunate, which could reflect osteoarthritis or Kienbock's disease.  Will refer to hand surgery.  Will place patient in splint here.  Final Clinical Impressions(s) / ED Diagnoses   Final diagnoses:  Left wrist pain    ED Discharge Orders    None       Roxy Horseman, PA-C 06/24/18 1610    Nira Conn, MD 06/24/18 [phone]

## 2018-06-29 ENCOUNTER — Ambulatory Visit: Payer: Self-pay | Admitting: Family Medicine

## 2018-06-30 ENCOUNTER — Ambulatory Visit (INDEPENDENT_AMBULATORY_CARE_PROVIDER_SITE_OTHER): Payer: Medicaid Other | Admitting: Family Medicine

## 2018-06-30 ENCOUNTER — Encounter: Payer: Self-pay | Admitting: Family Medicine

## 2018-06-30 ENCOUNTER — Other Ambulatory Visit: Payer: Self-pay

## 2018-06-30 ENCOUNTER — Emergency Department (HOSPITAL_COMMUNITY)
Admission: EM | Admit: 2018-06-30 | Discharge: 2018-06-30 | Disposition: A | Discharge status: Home / Self Care | Payer: Medicaid Other | Attending: Emergency Medicine | Admitting: Emergency Medicine

## 2018-06-30 ENCOUNTER — Emergency Department (HOSPITAL_COMMUNITY): Payer: Medicaid Other

## 2018-06-30 VITALS — BP 132/68 | HR 66 | Temp 98.0°F | Ht 65.0 in | Wt 165.0 lb

## 2018-06-30 DIAGNOSIS — R05 Cough: Secondary | ICD-10-CM | POA: Diagnosis present

## 2018-06-30 DIAGNOSIS — F209 Schizophrenia, unspecified: Secondary | ICD-10-CM | POA: Diagnosis not present

## 2018-06-30 DIAGNOSIS — M25532 Pain in left wrist: Secondary | ICD-10-CM | POA: Diagnosis not present

## 2018-06-30 DIAGNOSIS — Z9049 Acquired absence of other specified parts of digestive tract: Secondary | ICD-10-CM | POA: Diagnosis not present

## 2018-06-30 DIAGNOSIS — J069 Acute upper respiratory infection, unspecified: Secondary | ICD-10-CM | POA: Diagnosis not present

## 2018-06-30 DIAGNOSIS — R8290 Unspecified abnormal findings in urine: Secondary | ICD-10-CM | POA: Diagnosis not present

## 2018-06-30 DIAGNOSIS — Z131 Encounter for screening for diabetes mellitus: Secondary | ICD-10-CM

## 2018-06-30 DIAGNOSIS — Z09 Encounter for follow-up examination after completed treatment for conditions other than malignant neoplasm: Secondary | ICD-10-CM

## 2018-06-30 LAB — POCT URINALYSIS DIP (MANUAL ENTRY)
Bilirubin, UA: NEGATIVE
Blood, UA: NEGATIVE
Glucose, UA: NEGATIVE mg/dL
Ketones, POC UA: NEGATIVE mg/dL
Nitrite, UA: NEGATIVE
Protein Ur, POC: NEGATIVE mg/dL
Spec Grav, UA: 1.015 (ref 1.010–1.025)
Urobilinogen, UA: 0.2 E.U./dL
pH, UA: 7 (ref 5.0–8.0)

## 2018-06-30 LAB — POCT GLYCOSYLATED HEMOGLOBIN (HGB A1C): Hemoglobin A1C: 5.6 % (ref 4.0–5.6)

## 2018-06-30 MED ORDER — ALBUTEROL SULFATE (2.5 MG/3ML) 0.083% IN NEBU
2.5000 mg | INHALATION_SOLUTION | Freq: Once | RESPIRATORY_TRACT | Status: AC
  Administered 2018-06-30: 2.5 mg via RESPIRATORY_TRACT
  Filled 2018-06-30: qty 3

## 2018-06-30 NOTE — Progress Notes (Signed)
Hospital Follow Up  Subjective:    Patient ID: Felicia Collier, female    DOB: 12-29-1955, 62 y.o.   MRN: [id number]   Chief Complaint  Patient presents with  . Follow-up    chronic condition    HPI  Ms. Felicia Collier has a past medical history of Schizophrenia, Chronic Knee Pain. She is here today for follow up.   Current Status: Since her last office visit, she has has a visit to ED for left arm/wrist pain.   She is currently doing well with no complaints of pain. She is not taking Gabapentin and Naproxen at this time.   She denies fevers, chills, fatigue, recent infections, weight loss, and night sweats.   She has not had any headaches, visual changes, dizziness, and falls.   She has occasional cough and shortness of breath on exertion.   No chest pain, heart palpitation reported.   No reports of GI problems such as nausea, vomiting, diarrhea, and constipation. She has no reports of blood in stools, dysuria and hematuria.   Her anxiety level is stable today. She continues to see Dr. Merlyn AlbertFred for counseling as needed. Denies suicidal ideations, homicidal ideations, or auditory hallucinations.   She denies pain today.   Past Medical History:  Diagnosis Date  . Chronic back pain   . Chronic knee pain   . Schizophrenia (HCC)     Family History  Problem Relation Age of Onset  . Other Mother        no health conditions per patient  . Other Father        no health conditions per patient    Social History   Socioeconomic History  . Marital status: Single    Spouse name: Not on file  . Number of children: Not on file  . Years of education: Not on file  . Highest education level: Not on file  Occupational History  . Not on file  Social Needs  . Financial resource strain: Not on file  . Food insecurity:    Worry: Not on file    Inability: Not on file  . Transportation needs:    Medical: Not on file    Non-medical: Not on file  Tobacco Use  . Smoking status: Never Smoker   . Smokeless tobacco: Never Used  Substance and Sexual Activity  . Alcohol use: No  . Drug use: No  . Sexual activity: Not Currently  Lifestyle  . Physical activity:    Days per week: Not on file    Minutes per session: Not on file  . Stress: Not on file  Relationships  . Social connections:    Talks on phone: Not on file    Gets together: Not on file    Attends religious service: Not on file    Active member of club or organization: Not on file    Attends meetings of clubs or organizations: Not on file    Relationship status: Not on file  . Intimate partner violence:    Fear of current or ex partner: Not on file    Emotionally abused: Not on file    Physically abused: Not on file    Forced sexual activity: Not on file  Other Topics Concern  . Not on file  Social History Narrative  . Not on file    Past Surgical History:  Procedure Laterality Date  . ABDOMINAL HYSTERECTOMY    . CHOLECYSTECTOMY    . WRIST SURGERY Left  Immunization History  Administered Date(s) Administered  . Influenza,inj,Quad PF,6+ Mos 09/26/2017  . Pneumococcal Conjugate-13 12/13/2016  . Tdap 05/24/2016    No outpatient medications have been marked as taking for the 06/30/18 encounter (Office Visit) with Kallie LocksStroud, Jayse Hodkinson M, FNP.    Allergies  Allergen Reactions  . Lidocaine     Face swelling     Pulse 66   Temp 98 F (36.7 C) (Oral)   Ht 5\' 5"  (1.651 m)   Wt 165 lb (74.8 kg)   SpO2 97%   BMI 27.46 kg/m   Review of Systems  Respiratory: Positive for cough and shortness of breath (occasional ).   All other systems reviewed and are negative.  Objective:   Physical Exam  Constitutional: She is oriented to person, place, and time. She appears well-developed and well-nourished.  HENT:  Head: Normocephalic and atraumatic.  Right Ear: External ear normal.  Left Ear: External ear normal.  Nose: Nose normal.  Mouth/Throat: Oropharynx is clear and moist.  Eyes: Pupils are equal,  round, and reactive to light. Conjunctivae and EOM are normal.  Neck: Normal range of motion. Neck supple.  Cardiovascular: Normal rate, regular rhythm, normal heart sounds and intact distal pulses.  Pulmonary/Chest: Effort normal and breath sounds normal.  Abdominal: Soft. Bowel sounds are normal.  Musculoskeletal: Normal range of motion.  Neurological: She is alert and oriented to person, place, and time.  Skin: Skin is warm and dry. Capillary refill takes less than 2 seconds.  Psychiatric: She has a normal mood and affect. Her behavior is normal. Judgment and thought content normal.   Assessment & Plan:   1. Hospital discharge follow-up Wrist pain is stable today. She is not taking any medications for pain at this time. Advised patient to call office for sick visit if she has minor issues in the future. Patient verbalized understanding.   2. Left wrist/arm pain She has Carpal Tunnel in her left wrist. Stable today. She will call for refills on Naproxen and Gabapentin as needed. Continue to wear wrist brace as needed for support.   3. Screening for diabetes Hgb A1c is normal at 5.6 today.  She will continue to decrease foods/beverages high in sugars and carbs and follow Heart Healthy or DASH diet. Increase physical activity to at least 30 minutes cardio exercise daily.   4. Abnormal urinalysis Moderate Leukocytes. She recently completed antibiotics. Suspect probable  vaginal discharge or failure to get clean catch. Patient also asymptotic. We will continue to monitor.   5. Follow up She will follow up in 6 months.  - POCT urinalysis dipstick  No orders of the defined types were placed in this encounter.   Raliegh IpNatalie Lecretia Buczek,  MSN, FNP-C Patient Care [facility] [address]  [place], [place] [address] [phone]

## 2018-06-30 NOTE — ED Provider Notes (Signed)
[facility]-EMERGENCY DEPT Provider Note   CSN: [id number] Arrival date & time: 06/30/18  1624     History   Chief Complaint Chief Complaint  Patient presents with  . Sore Throat  . Itchy Eye  . Shortness of Breath    HPI Felicia Collier is a 62 y.o. female with hx of Schizophrenia, chronic knee pain and osteoarthritis who presents to the ED with c/o cough and chronic back pain. Patient was evaluated by her PCP today and review of the chart reports patient has no complaints and is doing well. Patient now c/o back pain, knee pain and cough.    HPI  Past Medical History:  Diagnosis Date  . Chronic back pain   . Chronic knee pain   . Schizophrenia [facility](HCC)     Patient Active Problem List   Diagnosis Date Noted  . Pain in left wrist 07/20/2017  . Schizophrenia (HCC) 05/01/2017  . Chronic pain of left knee 02/09/2017  . Unilateral primary osteoarthritis, left knee 02/09/2017  . Neuropathy 11/22/2016  . Gastroesophageal reflux disease without esophagitis 11/22/2016  . Weight gain 11/22/2016  . Atypical chest pain 11/04/2016    Past Surgical History:  Procedure Laterality Date  . ABDOMINAL HYSTERECTOMY    . CHOLECYSTECTOMY    . WRIST SURGERY Left      OB History   None      Home Medications    Prior to Admission medications   Medication Sig Start Date End Date Taking? Authorizing Provider  gabapentin (NEURONTIN) 100 MG capsule Take 1 capsule (100 mg total) by mouth 3 (three) times daily. Patient not taking: Reported on 06/30/2018 05/31/18   Kallie LocksStroud, Natalie M, FNP  naproxen (NAPROSYN) 375 MG tablet Take 1 tablet (375 mg total) by mouth 2 (two) times daily. Patient not taking: Reported on 06/30/2018 06/16/18   Derwood KaplanNanavati, Ankit, MD  sulfamethoxazole-trimethoprim (BACTRIM DS,SEPTRA DS) 800-160 MG tablet Take 1 tablet by mouth 2 (two) times daily. Patient not taking: Reported on 06/12/2018 05/31/18   Kallie LocksStroud, Natalie M, FNP    Family History Family  History  Problem Relation Age of Onset  . Other Mother        no health conditions per patient  . Other Father        no health conditions per patient    Social History Social History   Tobacco Use  . Smoking status: Never Smoker  . Smokeless tobacco: Never Used  Substance Use Topics  . Alcohol use: No  . Drug use: No     Allergies   Lidocaine   Review of Systems Review of Systems  Constitutional: Negative for chills and fever.  HENT: Negative for congestion, ear pain and trouble swallowing.   Eyes: Negative for pain, discharge and itching.  Respiratory: Positive for cough.   Gastrointestinal: Negative for abdominal pain, nausea and vomiting.  Genitourinary: Negative for dysuria.  Musculoskeletal: Positive for arthralgias and back pain.  Skin: Negative for rash.  Neurological: Negative for headaches.     Physical Exam Updated Vital Signs BP (!) 146/78 (BP Location: Right Arm)   Pulse 73   Temp 97.8 F (36.6 C) (Oral)   Resp 18   Ht 5\' 5"  (1.651 m)   Wt 74.8 kg   SpO2 98%   BMI 27.46 kg/m   Physical Exam  Constitutional: She appears well-developed and well-nourished. No distress.  HENT:  Head: Normocephalic.  Right Ear: Tympanic membrane normal.  Left Ear: Tympanic membrane normal.  Nose:  Nose normal.  Mouth/Throat: Oropharynx is clear and moist.  Eyes: Conjunctivae and EOM are normal.  Neck: Normal range of motion. Neck supple.  Cardiovascular: Normal rate and regular rhythm.  Pulmonary/Chest: Effort normal. She has wheezes (occasional).  Abdominal: Soft. There is no tenderness.  Musculoskeletal: Normal range of motion.  Lymphadenopathy:    She has no cervical adenopathy.  Neurological: She is alert.  Skin: Skin is warm and dry.  Nursing note and vitals reviewed.    ED Treatments / Results  Labs (all labs ordered are listed, but only abnormal results are displayed) Labs Reviewed - No data to display Radiology Dg Chest 2 View  Result  Date: 06/30/2018 CLINICAL DATA:  62 y/o  F; shortness of breath and cough for 1 day. EXAM: CHEST - 2 VIEW COMPARISON:  02/22/2018 chest radiograph FINDINGS: Stable normal cardiac silhouette given projection and technique. Mild bronchitic changes greatest in the lung bases. No consolidation, effusion or pneumothorax. Multilevel discogenic degenerative changes of the spine. Right upper quadrant cholecystectomy clips. IMPRESSION: Mild bronchitic changes greatest in the lung bases. No focal consolidation. Electronically Signed   By: Mitzi HansenLance  Furusawa-Stratton M.D.   On: 06/30/2018 18:26    Procedures Procedures (including critical care time)  Medications Ordered in ED Medications  albuterol (PROVENTIL) (2.5 MG/3ML) 0.083% nebulizer solution 2.5 mg (2.5 mg Nebulization Given 06/30/18 1853)   Patient reports she feels fine after neb treatment and standing at door stating she is ready to leave. Patient ambulating without breathing difficulty. She is walking and talking. She is not coughing.   Initial Impression / Assessment and Plan / ED Course  I have reviewed the triage vital signs and the nursing notes.    62 y.o. female here with c/o cough. Pt CXR negative for acute infiltrate. Patients symptoms are consistent with URI, likely viral etiology. Discussed that antibiotics are not indicated for viral infections. Pt will be discharged with symptomatic treatment.  Verbalizes understanding and is agreeable with plan. Pt is hemodynamically stable & in NAD prior to dc.    Final Clinical Impressions(s) / ED Diagnoses   Final diagnoses:  Acute URI    ED Discharge Orders    None       Kerrie Buffaloeese, Lucas Winograd Kent Estates[place], [place]NP 06/30/18 2259    Sabas SousBero, Michael M, MD 07/01/18 [phone]

## 2018-06-30 NOTE — Discharge Instructions (Addendum)
Follow up with your doctor

## 2018-06-30 NOTE — ED Triage Notes (Addendum)
Pt to ED pov with c/o of itchy throat and eyes as well as SOB. Pt stated that she went to dr. Isidore Moosffice today for SOB and they told patient she was fine, but the pt states she "still feels like she is SOB and heavy breathing" Pt states her stomach also "feels so puffy" Pt is A&O 4.

## 2018-07-02 ENCOUNTER — Encounter (HOSPITAL_COMMUNITY): Payer: Self-pay

## 2018-07-02 ENCOUNTER — Other Ambulatory Visit: Payer: Self-pay

## 2018-07-02 ENCOUNTER — Emergency Department (HOSPITAL_COMMUNITY)
Admission: EM | Admit: 2018-07-02 | Discharge: 2018-07-02 | Disposition: A | Discharge status: Home / Self Care | Payer: Medicaid Other | Attending: Emergency Medicine | Admitting: Emergency Medicine

## 2018-07-02 DIAGNOSIS — J069 Acute upper respiratory infection, unspecified: Secondary | ICD-10-CM | POA: Insufficient documentation

## 2018-07-02 DIAGNOSIS — J029 Acute pharyngitis, unspecified: Secondary | ICD-10-CM | POA: Diagnosis present

## 2018-07-02 MED ORDER — FLUTICASONE PROPIONATE 50 MCG/ACT NA SUSP: 1.0000 | Freq: Every day | NASAL | 2 refills | Status: DC

## 2018-07-02 MED ORDER — BENZONATATE 200 MG PO CAPS: 200.0000 mg | ORAL_CAPSULE | Freq: Three times a day (TID) | ORAL | 0 refills | Status: AC

## 2018-07-02 NOTE — ED Provider Notes (Signed)
[facility]-EMERGENCY DEPT Provider Note   CSN: [id number] Arrival date & time: 07/02/18  0602     History   Chief Complaint Chief Complaint  Patient presents with  . Sore Throat  . Nausea    HPI Jacqualin CombesSheila M Paglia is a 62 y.o. female.  62 year old female returns for ongoing sore throat, congestion and cough.  Patient was seen for same symptoms 2 days ago, had normal chest x-ray and was diagnosed with URI, given albuterol inhaler.  Patient states that she is using the inhaler without any relief of her symptoms.  Denies fevers, chills, wheezing, shortness of breath.  No known sick contacts.  No other complaints or concerns.     Past Medical History:  Diagnosis Date  . Chronic back pain   . Chronic knee pain   . Schizophrenia [facility](HCC)     Patient Active Problem List   Diagnosis Date Noted  . Pain in left wrist 07/20/2017  . Schizophrenia (HCC) 05/01/2017  . Chronic pain of left knee 02/09/2017  . Unilateral primary osteoarthritis, left knee 02/09/2017  . Neuropathy 11/22/2016  . Gastroesophageal reflux disease without esophagitis 11/22/2016  . Weight gain 11/22/2016  . Atypical chest pain 11/04/2016    Past Surgical History:  Procedure Laterality Date  . ABDOMINAL HYSTERECTOMY    . CHOLECYSTECTOMY    . WRIST SURGERY Left      OB History   None      Home Medications    Prior to Admission medications   Medication Sig Start Date End Date Taking? Authorizing Provider  benzonatate (TESSALON) 200 MG capsule Take 1 capsule (200 mg total) by mouth 3 (three) times daily for 10 days. 07/02/18 07/12/18  Jeannie FendMurphy, Laura A, PA-C  fluticasone (FLONASE) 50 MCG/ACT nasal spray Place 1 spray into both nostrils daily. 07/02/18   Jeannie FendMurphy, Laura A, PA-C    Family History Family History  Problem Relation Age of Onset  . Other Mother        no health conditions per patient  . Other Father        no health conditions per patient    Social History Social  History   Tobacco Use  . Smoking status: Never Smoker  . Smokeless tobacco: Never Used  Substance Use Topics  . Alcohol use: No  . Drug use: No     Allergies   Lidocaine   Review of Systems Review of Systems  Constitutional: Negative for chills and fever.  HENT: Positive for congestion, postnasal drip, rhinorrhea and sore throat. Negative for ear pain, facial swelling, sinus pressure, sinus pain, sneezing and trouble swallowing.   Eyes: Negative for discharge and redness.  Respiratory: Positive for cough. Negative for shortness of breath and wheezing.   Cardiovascular: Negative for chest pain.  Skin: Negative for rash and wound.  Allergic/Immunologic: Negative for immunocompromised state.  Neurological: Negative for headaches.  Hematological: Negative for adenopathy.  Psychiatric/Behavioral: Negative for confusion.  All other systems reviewed and are negative.    Physical Exam Updated Vital Signs BP (!) 145/83 (BP Location: Left Arm)   Pulse 73   Temp 97.8 F (36.6 C) (Oral)   Resp 16   Ht 5\' 5"  (1.651 m)   Wt 74.8 kg   SpO2 99%   BMI 27.46 kg/m   Physical Exam  Constitutional: She is oriented to person, place, and time. She appears well-developed and well-nourished. No distress.  HENT:  Head: Normocephalic and atraumatic.  Right Ear: Hearing, tympanic membrane and  ear canal normal.  Left Ear: Hearing, tympanic membrane and ear canal normal.  Mouth/Throat: Uvula is midline and mucous membranes are normal. No uvula swelling. Posterior oropharyngeal erythema present. No oropharyngeal exudate, posterior oropharyngeal edema or tonsillar abscesses. No tonsillar exudate.  Neck: Normal range of motion. Neck supple.  Cardiovascular: Normal rate, regular rhythm, normal heart sounds and intact distal pulses.  Pulmonary/Chest: Effort normal.  Lymphadenopathy:    She has no cervical adenopathy.  Neurological: She is alert and oriented to person, place, and time.  Skin:  Skin is warm and dry. She is not diaphoretic.  Psychiatric: She has a normal mood and affect. Her behavior is normal.  Nursing note and vitals reviewed.    ED Treatments / Results  Labs (all labs ordered are listed, but only abnormal results are displayed) Labs Reviewed - No data to display  EKG None  Radiology Dg Chest 2 View  Result Date: 06/30/2018 CLINICAL DATA:  62 y/o  F; shortness of breath and cough for 1 day. EXAM: CHEST - 2 VIEW COMPARISON:  02/22/2018 chest radiograph FINDINGS: Stable normal cardiac silhouette given projection and technique. Mild bronchitic changes greatest in the lung bases. No consolidation, effusion or pneumothorax. Multilevel discogenic degenerative changes of the spine. Right upper quadrant cholecystectomy clips. IMPRESSION: Mild bronchitic changes greatest in the lung bases. No focal consolidation. Electronically Signed   By: Mitzi HansenLance  Furusawa-Stratton M.D.   On: 06/30/2018 18:26    Procedures Procedures (including critical care time)  Medications Ordered in ED Medications - No data to display   Initial Impression / Assessment and Plan / ED Course  I have reviewed the triage vital signs and the nursing notes.  Pertinent labs & imaging results that were available during my care of the patient were reviewed by me and considered in my medical decision making (see chart for details).  Clinical Course as of Jul 02 644  Wynelle LinkSun Jul 02, 2018  [id number] 62 year old female with upper respiratory infection.  Reviewed chest x-ray results from previous visit with patient, recommended Tessalon and Flonase and recheck with PCP.   [LM]    Clinical Course User Index [LM] Jeannie FendMurphy, Laura A, PA-C    Final Clinical Impressions(s) / ED Diagnoses   Final diagnoses:  Upper respiratory tract infection, unspecified type    ED Discharge Orders         Ordered    benzonatate (TESSALON) 200 MG capsule  3 times daily     07/02/18 0641    fluticasone (FLONASE) 50 MCG/ACT  nasal spray  Daily     07/02/18 0641           Jeannie FendMurphy, Laura A, PA-C 07/02/18 0645    Gilda CreasePollina, Christopher J, MD 07/02/18 [phone]

## 2018-07-02 NOTE — ED Triage Notes (Signed)
Pt presents to ED for sore throat and nausea. PT reports symptoms started 3 days ago. Pt also reports nasal congestion and nausea.

## 2018-07-02 NOTE — Discharge Instructions (Addendum)
Use Flonase nasal spray daily. Use tessalon as needed as prescribed for cough. Recheck with your doctor.

## 2018-07-04 ENCOUNTER — Emergency Department (HOSPITAL_COMMUNITY)
Admission: EM | Admit: 2018-07-04 | Discharge: 2018-07-04 | Discharge status: Against Medical Advice | Payer: Medicaid Other | Attending: Emergency Medicine | Admitting: Emergency Medicine

## 2018-07-04 ENCOUNTER — Encounter (HOSPITAL_COMMUNITY): Payer: Self-pay | Admitting: *Deleted

## 2018-07-04 ENCOUNTER — Other Ambulatory Visit: Payer: Self-pay

## 2018-07-04 DIAGNOSIS — R070 Pain in throat: Secondary | ICD-10-CM | POA: Diagnosis present

## 2018-07-04 DIAGNOSIS — Z5321 Procedure and treatment not carried out due to patient leaving prior to being seen by health care provider: Secondary | ICD-10-CM | POA: Diagnosis not present

## 2018-07-04 NOTE — ED Triage Notes (Signed)
Pt c/o sore throat, cough, body aches, congestion for several days. She is taking prescribed meds without relief.

## 2018-07-10 ENCOUNTER — Encounter: Payer: Self-pay | Admitting: Family Medicine

## 2018-07-10 ENCOUNTER — Ambulatory Visit (INDEPENDENT_AMBULATORY_CARE_PROVIDER_SITE_OTHER): Payer: Medicaid Other | Admitting: Family Medicine

## 2018-07-10 VITALS — BP 130/82 | HR 66 | Temp 97.8°F | Ht 66.0 in | Wt 163.0 lb

## 2018-07-10 DIAGNOSIS — G629 Polyneuropathy, unspecified: Secondary | ICD-10-CM | POA: Diagnosis not present

## 2018-07-10 DIAGNOSIS — F203 Undifferentiated schizophrenia: Secondary | ICD-10-CM

## 2018-07-10 DIAGNOSIS — Z09 Encounter for follow-up examination after completed treatment for conditions other than malignant neoplasm: Secondary | ICD-10-CM

## 2018-07-10 DIAGNOSIS — R05 Cough: Secondary | ICD-10-CM

## 2018-07-10 DIAGNOSIS — R059 Cough, unspecified: Secondary | ICD-10-CM

## 2018-07-10 LAB — POCT URINALYSIS DIP (MANUAL ENTRY)
Bilirubin, UA: NEGATIVE
Blood, UA: NEGATIVE
Glucose, UA: NEGATIVE mg/dL
Ketones, POC UA: NEGATIVE mg/dL
Nitrite, UA: NEGATIVE
Protein Ur, POC: NEGATIVE mg/dL
Spec Grav, UA: 1.01 (ref 1.010–1.025)
Urobilinogen, UA: 0.2 E.U./dL
pH, UA: 7 (ref 5.0–8.0)

## 2018-07-10 MED ORDER — GABAPENTIN 100 MG PO CAPS: 100.0000 mg | ORAL_CAPSULE | Freq: Three times a day (TID) | ORAL | 1 refills | Status: DC

## 2018-07-10 NOTE — Progress Notes (Signed)
Hospital Follow Up  Subjective:    Patient ID: Felicia Collier, female    DOB: 05-27-1956, 62 y.o.   MRN: [id number]   Chief Complaint  Patient presents with  . Follow-up    chronic conditons    HPI  Felicia Collier is a 62 year old female with a past medical history of Schizophrenia, Chronic Knee Pain, and Chronic Knee pain. She is here today for hospital follow up.   Current Status: Since her last office visit, she has had several visits to the ED for neck pain, abdominal discomfort, and cough, throat discomfort. She is currently taking Robitussin and Benzonatate for to aide with symptoms. She continues to have moderated pain in her feet and hands.   She denies fevers, chills, fatigue, recent infections, weight loss, and night sweats.   She has not had any headaches, visual changes, dizziness, and falls.   No chest pain, heart palpitations, cough and shortness of breath reported.   No reports of GI problems such as nausea, vomiting, diarrhea, and constipation.   She has no reports of blood in stools, dysuria and hematuria.   Her anxiety is stable today. She denies suicidal ideations, homicidal ideations, or auditory hallucinations.   Past Medical History:  Diagnosis Date  . Chronic back pain   . Chronic knee pain   . Schizophrenia (HCC)     Family History  Problem Relation Age of Onset  . Other Mother        no health conditions per patient  . Other Father        no health conditions per patient    Social History   Socioeconomic History  . Marital status: Single    Spouse name: Not on file  . Number of children: Not on file  . Years of education: Not on file  . Highest education level: Not on file  Occupational History  . Not on file  Social Needs  . Financial resource strain: Not on file  . Food insecurity:    Worry: Not on file    Inability: Not on file  . Transportation needs:    Medical: Not on file    Non-medical: Not on file  Tobacco Use  . Smoking  status: Never Smoker  . Smokeless tobacco: Never Used  Substance and Sexual Activity  . Alcohol use: No  . Drug use: No  . Sexual activity: Not Currently  Lifestyle  . Physical activity:    Days per week: Not on file    Minutes per session: Not on file  . Stress: Not on file  Relationships  . Social connections:    Talks on phone: Not on file    Gets together: Not on file    Attends religious service: Not on file    Active member of club or organization: Not on file    Attends meetings of clubs or organizations: Not on file    Relationship status: Not on file  . Intimate partner violence:    Fear of current or ex partner: Not on file    Emotionally abused: Not on file    Physically abused: Not on file    Forced sexual activity: Not on file  Other Topics Concern  . Not on file  Social History Narrative  . Not on file    Past Surgical History:  Procedure Laterality Date  . ABDOMINAL HYSTERECTOMY    . CHOLECYSTECTOMY    . WRIST SURGERY Left     Immunization History  Administered Date(s) Administered  . Influenza,inj,Quad PF,6+ Mos 09/26/2017  . Pneumococcal Conjugate-13 12/13/2016  . Tdap 05/24/2016    Current Meds  Medication Sig  . benzonatate (TESSALON) 200 MG capsule Take 1 capsule (200 mg total) by mouth 3 (three) times daily for 10 days.  . fluticasone (FLONASE) 50 MCG/ACT nasal spray Place 1 spray into both nostrils daily.    Allergies  Allergen Reactions  . Lidocaine     Face swelling     BP 130/82 (BP Location: Left Arm, Patient Position: Sitting, Cuff Size: Large)   Pulse 66   Temp 97.8 F (36.6 C) (Oral)   Ht 5\' 6"  (1.676 m)   Wt 163 lb (73.9 kg)   SpO2 100%   BMI 26.31 kg/m   Review of Systems  Constitutional: Negative.   HENT: Negative.   Eyes: Negative.   Respiratory: Positive for cough and shortness of breath (Occasional ).   Cardiovascular: Negative.   Gastrointestinal: Negative.        Belching  Endocrine: Negative.    Genitourinary: Negative.   Musculoskeletal: Positive for back pain (chronic lower back pain).  Skin: Negative.   Allergic/Immunologic: Negative.   Neurological: Positive for headaches (Occasional).  Hematological: Negative.   Psychiatric/Behavioral: Negative.    Objective:   Physical Exam  Constitutional: She is oriented to person, place, and time. She appears well-developed and well-nourished.  HENT:  Head: Normocephalic and atraumatic.  Right Ear: External ear normal.  Left Ear: External ear normal.  Nose: Nose normal.  Mouth/Throat: Oropharynx is clear and moist.  Eyes: Pupils are equal, round, and reactive to light. Conjunctivae and EOM are normal.  Neck: Normal range of motion. Neck supple.  Cardiovascular: Normal rate, regular rhythm, normal heart sounds and intact distal pulses.  Pulmonary/Chest: Effort normal and breath sounds normal.  Abdominal: Soft. Bowel sounds are normal. She exhibits distension (Obese).  Musculoskeletal: Normal range of motion.  Neurological: She is alert and oriented to person, place, and time.  Skin: Skin is warm and dry. Capillary refill takes less than 2 seconds.  Psychiatric: She has a normal mood and affect. Her behavior is normal. Judgment and thought content normal.  Nursing note and vitals reviewed.  Assessment & Plan:   1. Hospital discharge follow-up  2. Cough Stable today. She will continue Benzonatate and Robitusin cough med as prescribed.   3. Undifferentiated schizophrenia (HCC) Stable.   4. Neuropathy We will initiate Gabapentin today.  - gabapentin (NEURONTIN) 100 MG capsule; Take 1 capsule (100 mg total) by mouth 3 (three) times daily.  Dispense: 90 capsule; Refill: 1  5. Follow up She will follow up in 1 month.  - POCT urinalysis dipstick  Meds ordered this encounter  Medications  . gabapentin (NEURONTIN) 100 MG capsule    Sig: Take 1 capsule (100 mg total) by mouth 3 (three) times daily.    Dispense:  90 capsule     Refill:  1    Raliegh IpNatalie Belvin Gauss,  MSN, FNP-C [facility] [address]  [place], [place] [address] [phone]

## 2018-07-10 NOTE — Patient Instructions (Signed)
Gabapentin capsules or tablets What is this medicine? GABAPENTIN (GA ba pen tin) is used to control partial seizures in adults with epilepsy. It is also used to treat certain types of nerve pain. This medicine may be used for other purposes; ask your health care provider or pharmacist if you have questions. COMMON BRAND NAME(S): Active-PAC with Gabapentin, Gabarone, Neurontin What should I tell my health care provider before I take this medicine? They need to know if you have any of these conditions: -kidney disease -suicidal thoughts, plans, or attempt; a previous suicide attempt by you or a family member -an unusual or allergic reaction to gabapentin, other medicines, foods, dyes, or preservatives -pregnant or trying to get pregnant -breast-feeding How should I use this medicine? Take this medicine by mouth with a glass of water. Follow the directions on the prescription label. You can take it with or without food. If it upsets your stomach, take it with food.Take your medicine at regular intervals. Do not take it more often than directed. Do not stop taking except on your doctor's advice. If you are directed to break the 600 or 800 mg tablets in half as part of your dose, the extra half tablet should be used for the next dose. If you have not used the extra half tablet within 28 days, it should be thrown away. A special MedGuide will be given to you by the pharmacist with each prescription and refill. Be sure to read this information carefully each time. Talk to your pediatrician regarding the use of this medicine in children. Special care may be needed. Overdosage: If you think you have taken too much of this medicine contact a poison control center or emergency room at once. NOTE: This medicine is only for you. Do not share this medicine with others. What if I miss a dose? If you miss a dose, take it as soon as you can. If it is almost time for your next dose, take only that dose. Do not  take double or extra doses. What may interact with this medicine? Do not take this medicine with any of the following medications: -other gabapentin products This medicine may also interact with the following medications: -alcohol -antacids -antihistamines for allergy, cough and cold -certain medicines for anxiety or sleep -certain medicines for depression or psychotic disturbances -homatropine; hydrocodone -naproxen -narcotic medicines (opiates) for pain -phenothiazines like chlorpromazine, mesoridazine, prochlorperazine, thioridazine This list may not describe all possible interactions. Give your health care provider a list of all the medicines, herbs, non-prescription drugs, or dietary supplements you use. Also tell them if you smoke, drink alcohol, or use illegal drugs. Some items may interact with your medicine. What should I watch for while using this medicine? Visit your doctor or health care professional for regular checks on your progress. You may want to keep a record at home of how you feel your condition is responding to treatment. You may want to share this information with your doctor or health care professional at each visit. You should contact your doctor or health care professional if your seizures get worse or if you have any new types of seizures. Do not stop taking this medicine or any of your seizure medicines unless instructed by your doctor or health care professional. Stopping your medicine suddenly can increase your seizures or their severity. Wear a medical identification bracelet or chain if you are taking this medicine for seizures, and carry a card that lists all your medications. You may get drowsy, dizzy,   or have blurred vision. Do not drive, use machinery, or do anything that needs mental alertness until you know how this medicine affects you. To reduce dizzy or fainting spells, do not sit or stand up quickly, especially if you are an older patient. Alcohol can  increase drowsiness and dizziness. Avoid alcoholic drinks. Your mouth may get dry. Chewing sugarless gum or sucking hard candy, and drinking plenty of water will help. The use of this medicine may increase the chance of suicidal thoughts or actions. Pay special attention to how you are responding while on this medicine. Any worsening of mood, or thoughts of suicide or dying should be reported to your health care professional right away. Women who become pregnant while using this medicine may enroll in the [place] Antiepileptic Drug Pregnancy Registry by calling 1-888-233-2334. This registry collects information about the safety of antiepileptic drug use during pregnancy. What side effects may I notice from receiving this medicine? Side effects that you should report to your doctor or health care professional as soon as possible: -allergic reactions like skin rash, itching or hives, swelling of the face, lips, or tongue -worsening of mood, thoughts or actions of suicide or dying Side effects that usually do not require medical attention (report to your doctor or health care professional if they continue or are bothersome): -constipation -difficulty walking or controlling muscle movements -dizziness -nausea -slurred speech -tiredness -tremors -weight gain This list may not describe all possible side effects. Call your doctor for medical advice about side effects. You may report side effects to FDA at 1-800-FDA-1088. Where should I keep my medicine? Keep out of reach of children. This medicine may cause accidental overdose and death if it taken by other adults, children, or pets. Mix any unused medicine with a substance like cat litter or coffee grounds. Then throw the medicine away in a sealed container like a sealed bag or a coffee can with a lid. Do not use the medicine after the expiration date. Store at room temperature between 15 and 30 degrees C (59 and 86 degrees F). NOTE: This  sheet is a summary. It may not cover all possible information. If you have questions about this medicine, talk to your doctor, pharmacist, or health care provider.  2018 Elsevier/Gold Standard (2013-12-28 15:26:50)  

## 2018-07-13 ENCOUNTER — Other Ambulatory Visit: Payer: Self-pay

## 2018-07-13 ENCOUNTER — Emergency Department (HOSPITAL_COMMUNITY)
Admission: EM | Admit: 2018-07-13 | Discharge: 2018-07-13 | Disposition: A | Discharge status: Home / Self Care | Payer: Medicaid Other | Attending: Emergency Medicine | Admitting: Emergency Medicine

## 2018-07-13 ENCOUNTER — Encounter (HOSPITAL_COMMUNITY): Payer: Self-pay | Admitting: *Deleted

## 2018-07-13 DIAGNOSIS — M79641 Pain in right hand: Secondary | ICD-10-CM | POA: Diagnosis not present

## 2018-07-13 DIAGNOSIS — M79642 Pain in left hand: Secondary | ICD-10-CM | POA: Insufficient documentation

## 2018-07-13 DIAGNOSIS — M79671 Pain in right foot: Secondary | ICD-10-CM | POA: Diagnosis not present

## 2018-07-13 DIAGNOSIS — G629 Polyneuropathy, unspecified: Secondary | ICD-10-CM | POA: Insufficient documentation

## 2018-07-13 DIAGNOSIS — M79672 Pain in left foot: Secondary | ICD-10-CM | POA: Diagnosis present

## 2018-07-13 NOTE — ED Provider Notes (Signed)
[facility]-EMERGENCY DEPT Provider Note   CSN: [id number] Arrival date & time: 07/13/18  2034     History   Chief Complaint No chief complaint on file.   HPI Felicia Collier is a 62 y.o. female.  HPI Patient is a 62 year old female presents the emergency department the burning sensation in her bilateral hands and bilateral feet over the past month.  She is currently on 100 mg 3 times daily of gabapentin.  She states this is not helping her symptoms.  This was initiated by her primary care physician.  She denies fevers.  Denies chest pain.  No shortness of breath.  Denies abdominal pain.  No weakness of her arms or legs.  Symptoms are mild in severity.   Past Medical History:  Diagnosis Date  . Chronic back pain   . Chronic knee pain   . Schizophrenia [facility](HCC)     Patient Active Problem List   Diagnosis Date Noted  . Pain in left wrist 07/20/2017  . Schizophrenia (HCC) 05/01/2017  . Chronic pain of left knee 02/09/2017  . Unilateral primary osteoarthritis, left knee 02/09/2017  . Neuropathy 11/22/2016  . Gastroesophageal reflux disease without esophagitis 11/22/2016  . Weight gain 11/22/2016  . Atypical chest pain 11/04/2016    Past Surgical History:  Procedure Laterality Date  . ABDOMINAL HYSTERECTOMY    . CHOLECYSTECTOMY    . WRIST SURGERY Left      OB History   None      Home Medications    Prior to Admission medications   Medication Sig Start Date End Date Taking? Authorizing Provider  fluticasone (FLONASE) 50 MCG/ACT nasal spray Place 1 spray into both nostrils daily. 07/02/18   Jeannie FendMurphy, Laura A, PA-C  gabapentin (NEURONTIN) 100 MG capsule Take 1 capsule (100 mg total) by mouth 3 (three) times daily. 07/10/18   Kallie LocksStroud, Natalie M, FNP    Family History Family History  Problem Relation Age of Onset  . Other Mother        no health conditions per patient  . Other Father        no health conditions per patient    Social  History Social History   Tobacco Use  . Smoking status: Never Smoker  . Smokeless tobacco: Never Used  Substance Use Topics  . Alcohol use: No  . Drug use: No     Allergies   Lidocaine   Review of Systems Review of Systems  All other systems reviewed and are negative.    Physical Exam Updated Vital Signs BP 132/86 (BP Location: Right Arm)   Pulse 85   Temp 98.3 F (36.8 C) (Oral)   Resp 17   SpO2 97%   Physical Exam  Constitutional: She is oriented to person, place, and time. She appears well-developed and well-nourished.  HENT:  Head: Normocephalic.  Eyes: EOM are normal.  Neck: Normal range of motion.  Pulmonary/Chest: Effort normal.  Abdominal: She exhibits no distension.  Musculoskeletal: Normal range of motion.  Normal bilateral radial pulses.  Normal grip strength bilaterally.  Normal PT and DP pulses bilaterally.  Normal motor function of her lower extremities  Neurological: She is alert and oriented to person, place, and time.  Psychiatric: She has a normal mood and affect.  Nursing note and vitals reviewed.    ED Treatments / Results  Labs (all labs ordered are listed, but only abnormal results are displayed) Labs Reviewed - No data to display  EKG None  Radiology  No results found.  Procedures Procedures (including critical care time)  Medications Ordered in ED Medications - No data to display   Initial Impression / Assessment and Plan / ED Course  I have reviewed the triage vital signs and the nursing notes.  Pertinent labs & imaging results that were available during my care of the patient were reviewed by me and considered in my medical decision making (see chart for details).     Ongoing peripheral neuropathy.  She will likely need increases of her gabapentin.  This is best prescribed and titrated by her primary care physician.  No indication for labs or images.  No indication for additional work-up in the emergency  department.  Final Clinical Impressions(s) / ED Diagnoses   Final diagnoses:  Neuropathy    ED Discharge Orders    None       Azalia Bilis, MD 07/13/18 2109

## 2018-07-13 NOTE — ED Notes (Signed)
Pt c/o burning in hands and feet. For over a month. Pt has no other complaints. Pt ambulatory and has full ROM.

## 2018-07-13 NOTE — ED Triage Notes (Signed)
Pt ambulatory to treatment room with steady gait,  reports she has had burning in her hands and her foot. She feels like she has a fever.

## 2018-07-19 ENCOUNTER — Encounter (HOSPITAL_COMMUNITY): Payer: Self-pay

## 2018-07-19 ENCOUNTER — Emergency Department (HOSPITAL_COMMUNITY): Payer: Medicaid Other

## 2018-07-19 ENCOUNTER — Other Ambulatory Visit: Payer: Self-pay

## 2018-07-19 ENCOUNTER — Emergency Department (HOSPITAL_COMMUNITY)
Admission: EM | Admit: 2018-07-19 | Discharge: 2018-07-19 | Disposition: A | Discharge status: Home / Self Care | Payer: Medicaid Other | Attending: Emergency Medicine | Admitting: Emergency Medicine

## 2018-07-19 DIAGNOSIS — K5732 Diverticulitis of large intestine without perforation or abscess without bleeding: Secondary | ICD-10-CM | POA: Diagnosis not present

## 2018-07-19 DIAGNOSIS — K5792 Diverticulitis of intestine, part unspecified, without perforation or abscess without bleeding: Secondary | ICD-10-CM

## 2018-07-19 DIAGNOSIS — R109 Unspecified abdominal pain: Secondary | ICD-10-CM | POA: Diagnosis present

## 2018-07-19 LAB — URINALYSIS, ROUTINE W REFLEX MICROSCOPIC
BILIRUBIN URINE: NEGATIVE
Glucose, UA: NEGATIVE mg/dL
Hgb urine dipstick: NEGATIVE
KETONES UR: NEGATIVE mg/dL
NITRITE: NEGATIVE
PROTEIN: NEGATIVE mg/dL
SPECIFIC GRAVITY, URINE: 1.003 — AB (ref 1.005–1.030)
pH: 7 (ref 5.0–8.0)

## 2018-07-19 LAB — COMPREHENSIVE METABOLIC PANEL
ALBUMIN: 3.9 g/dL (ref 3.5–5.0)
ALT: 21 U/L (ref 0–44)
ANION GAP: 11 (ref 5–15)
AST: 25 U/L (ref 15–41)
Alkaline Phosphatase: 102 U/L (ref 38–126)
BILIRUBIN TOTAL: 1 mg/dL (ref 0.3–1.2)
BUN: 9 mg/dL (ref 8–23)
CO2: 29 mmol/L (ref 22–32)
Calcium: 9 mg/dL (ref 8.9–10.3)
Chloride: 102 mmol/L (ref 98–111)
Creatinine, Ser: 0.71 mg/dL (ref 0.44–1.00)
GFR calc Af Amer: >60 mL/min (ref >60)
GFR calc non Af Amer: >60 mL/min (ref >60)
GLUCOSE: 92 mg/dL (ref 70–99)
POTASSIUM: 3.3 mmol/L — AB (ref 3.5–5.1)
SODIUM: 142 mmol/L (ref 135–145)
TOTAL PROTEIN: 7.6 g/dL (ref 6.5–8.1)

## 2018-07-19 LAB — CBC
HEMATOCRIT: 40.5 % (ref 36.0–46.0)
HEMOGLOBIN: 13 g/dL (ref 12.0–15.0)
MCH: 26.3 pg (ref 26.0–34.0)
MCHC: 32.1 g/dL (ref 30.0–36.0)
MCV: 81.8 fL (ref 78.0–100.0)
Platelets: 325 10*3/uL (ref 150–400)
RBC: 4.95 MIL/uL (ref 3.87–5.11)
RDW: 14.2 % (ref 11.5–15.5)
WBC: 13.2 10*3/uL — ABNORMAL HIGH (ref 4.0–10.5)

## 2018-07-19 LAB — LACTIC ACID, PLASMA: LACTIC ACID, VENOUS: 1.1 mmol/L (ref 0.5–1.9)

## 2018-07-19 LAB — LIPASE, BLOOD: LIPASE: 31 U/L (ref 11–51)

## 2018-07-19 LAB — MRSA PCR SCREENING: MRSA BY PCR: NEGATIVE

## 2018-07-19 MED ORDER — CIPROFLOXACIN IN D5W 400 MG/200ML IV SOLN
400.0000 mg | Freq: Once | INTRAVENOUS | Status: AC
  Administered 2018-07-19: 400 mg via INTRAVENOUS
  Filled 2018-07-19: qty 200

## 2018-07-19 MED ORDER — METRONIDAZOLE IN NACL 5-0.79 MG/ML-% IV SOLN
500.0000 mg | Freq: Once | INTRAVENOUS | Status: AC
Start: 2018-07-19 — End: 2018-07-19
  Administered 2018-07-19: 500 mg via INTRAVENOUS
  Filled 2018-07-19: qty 100

## 2018-07-19 MED ORDER — FENTANYL CITRATE (PF) 100 MCG/2ML IJ SOLN
50.0000 ug | Freq: Once | INTRAMUSCULAR | Status: AC
  Administered 2018-07-19: 50 ug via INTRAVENOUS
  Filled 2018-07-19: qty 2

## 2018-07-19 MED ORDER — FAMOTIDINE 20 MG PO TABS
20.0000 mg | ORAL_TABLET | Freq: Once | ORAL | Status: AC
  Administered 2018-07-19: 20 mg via ORAL
  Filled 2018-07-19: qty 1

## 2018-07-19 MED ORDER — PIPERACILLIN-TAZOBACTAM 3.375 G IVPB 30 MIN: 3.3750 g | Freq: Once | INTRAVENOUS | Status: DC

## 2018-07-19 MED ORDER — METRONIDAZOLE 500 MG PO TABS: 500.0000 mg | ORAL_TABLET | Freq: Two times a day (BID) | ORAL | 0 refills | Status: DC

## 2018-07-19 MED ORDER — DOCUSATE SODIUM 100 MG PO CAPS: 100.0000 mg | ORAL_CAPSULE | Freq: Two times a day (BID) | ORAL | 0 refills | Status: DC

## 2018-07-19 MED ORDER — METRONIDAZOLE IN NACL 5-0.79 MG/ML-% IV SOLN: 500.0000 mg | Freq: Once | INTRAVENOUS | Status: DC

## 2018-07-19 MED ORDER — IOPAMIDOL (ISOVUE-300) INJECTION 61%
100.0000 mL | Freq: Once | INTRAVENOUS | Status: AC | PRN
  Administered 2018-07-19: 100 mL via INTRAVENOUS

## 2018-07-19 MED ORDER — CIPROFLOXACIN IN D5W 400 MG/200ML IV SOLN: 400.0000 mg | Freq: Once | INTRAVENOUS | Status: DC

## 2018-07-19 MED ORDER — VANCOMYCIN HCL 10 G IV SOLR
1500.0000 mg | Freq: Once | INTRAVENOUS | Status: DC
  Filled 2018-07-19: qty 1500

## 2018-07-19 MED ORDER — CIPROFLOXACIN HCL 500 MG PO TABS: 500.0000 mg | ORAL_TABLET | Freq: Two times a day (BID) | ORAL | 0 refills | Status: DC

## 2018-07-19 MED ORDER — LACTATED RINGERS IV BOLUS
1000.0000 mL | Freq: Once | INTRAVENOUS | Status: AC
  Administered 2018-07-19: 1000 mL via INTRAVENOUS

## 2018-07-19 MED ORDER — METOCLOPRAMIDE HCL 5 MG/ML IJ SOLN
5.0000 mg | Freq: Once | INTRAMUSCULAR | Status: AC
  Administered 2018-07-19: 5 mg via INTRAVENOUS
  Filled 2018-07-19: qty 2

## 2018-07-19 MED ORDER — IOPAMIDOL (ISOVUE-300) INJECTION 61%
INTRAVENOUS | Status: AC
  Filled 2018-07-19: qty 100

## 2018-07-19 MED ORDER — DOCUSATE SODIUM 100 MG PO CAPS
100.0000 mg | ORAL_CAPSULE | Freq: Once | ORAL | Status: AC
  Administered 2018-07-19: 100 mg via ORAL
  Filled 2018-07-19: qty 1

## 2018-07-19 MED ORDER — LACTATED RINGERS IV BOLUS: 1000.0000 mL | Freq: Once | INTRAVENOUS | Status: DC

## 2018-07-19 NOTE — ED Triage Notes (Signed)
Pt states abd pain since last night. Pt states pain is primarily right sided, "crampy like". Pt states she recently quit taking her gabapentin because her stomach hurt.

## 2018-07-19 NOTE — ED Provider Notes (Signed)
Emergency Department Provider Note   I have reviewed the triage vital signs and the nursing notes.   HISTORY  Chief Complaint Abdominal Pain   HPI Felicia Collier is a 62 y.o. female with a history of schizophrenia who is here with abdominal pain of uncertain etiology.  Patient states that she cannot really explain it just hurts.  She is had it for a few days she thinks it might be related to gabapentin but she is not sure how long she has been on the gabapentin.  She might have nausea with some while but is not sure.  No vomiting, diarrhea.  She states she did have a hard stool yesterday but it was normal today.  No blood or dark stools.  She is not sure she has had a history of pain like this before.  No urinary symptoms. No other associated or modifying symptoms.    Past Medical History:  Diagnosis Date  . Chronic back pain   . Chronic knee pain   . Schizophrenia [facility])     Patient Active Problem List   Diagnosis Date Noted  . Pain in left wrist 07/20/2017  . Schizophrenia (HCC) 05/01/2017  . Chronic pain of left knee 02/09/2017  . Unilateral primary osteoarthritis, left knee 02/09/2017  . Neuropathy 11/22/2016  . Gastroesophageal reflux disease without esophagitis 11/22/2016  . Weight gain 11/22/2016  . Atypical chest pain 11/04/2016    Past Surgical History:  Procedure Laterality Date  . ABDOMINAL HYSTERECTOMY    . CHOLECYSTECTOMY    . WRIST SURGERY Left     Current Outpatient Rx  . Order #: [id number] Class: Normal  . Order #: [id number] Class: Normal    Allergies Lidocaine  Family History  Problem Relation Age of Onset  . Other Mother        no health conditions per patient  . Other Father        no health conditions per patient    Social History Social History   Tobacco Use  . Smoking status: Never Smoker  . Smokeless tobacco: Never Used  Substance Use Topics  . Alcohol use: No  . Drug use: No    Review of Systems  All other systems  negative except as documented in the HPI. All pertinent positives and negatives as reviewed in the HPI. ____________________________________________   PHYSICAL EXAM:  VITAL SIGNS: ED Triage Vitals  Enc Vitals Group     BP 07/19/18 0905 (!) 132/120     Pulse Rate 07/19/18 0905 64     Resp 07/19/18 0905 15     Temp 07/19/18 0905 98.1 F (36.7 C)     Temp Source 07/19/18 0905 Oral     SpO2 07/19/18 0905 100 %     Weight 07/19/18 0909 163 lb (73.9 kg)     Height 07/19/18 0909 5\' 6"  (1.676 m)    Constitutional: Alert and oriented. Well appearing and in no acute distress. Eyes: Conjunctivae are normal. PERRL. EOMI. Head: Atraumatic. Nose: No congestion/rhinnorhea. Mouth/Throat: Mucous membranes are moist.  Oropharynx non-erythematous. Neck: No stridor.  No meningeal signs.   Cardiovascular: Normal rate, regular rhythm. Good peripheral circulation. Grossly normal heart sounds.   Respiratory: Normal respiratory effort.  No retractions. Lungs CTAB. Gastrointestinal: Soft and nontender. No rebound. No guarding. Normal bowel sounds. Soft. No distention.  Musculoskeletal: No lower extremity tenderness nor edema. No gross deformities of extremities. Neurologic:  Normal speech and language. No gross focal neurologic deficits are appreciated.  Skin:  Skin  is warm, dry and intact. No rash noted.  ____________________________________________   LABS (all labs ordered are listed, but only abnormal results are displayed)  Labs Reviewed  COMPREHENSIVE METABOLIC PANEL - Abnormal; Notable for the following components:      Result Value   Potassium 3.3 (*)    All other components within normal limits  CBC - Abnormal; Notable for the following components:   WBC 13.2 (*)    All other components within normal limits  URINALYSIS, ROUTINE W REFLEX MICROSCOPIC - Abnormal; Notable for the following components:   Color, Urine STRAW (*)    Specific Gravity, Urine 1.003 (*)    Leukocytes, UA SMALL (*)     Bacteria, UA RARE (*)    All other components within normal limits  MRSA PCR SCREENING  LIPASE, BLOOD  LACTIC ACID, PLASMA   ____________________________________________  EKG   EKG Interpretation  Date/Time:  Wednesday July 19 2018 10:44:08 EDT Ventricular Rate:  73 PR Interval:    QRS Duration: 92 QT Interval:  423 QTC Calculation: 467 R Axis:   13 Text Interpretation:  Sinus rhythm Low voltage, precordial leads Abnormal R-wave progression, early transition Left ventricular hypertrophy Borderline T abnormalities, inferior leads Baseline wander in lead(s) II III aVF V5 V6 No significant change since last tracing Confirmed by Marily Memos [phone]) on 07/19/2018 1:21:24 PM       ____________________________________________  RADIOLOGY  No results found.  ____________________________________________   PROCEDURES  Procedure(s) performed:   Procedures   ____________________________________________   INITIAL IMPRESSION / ASSESSMENT AND PLAN / ED COURSE  Unclear etiology of her symptoms.  Patient is very nondescript in her complaint.  Could be reflux versus cardiac versus constipation.  Also could be gas.  Will evaluate appropriately. No indication for imaging currently.   Improved symptoms. But not resolved and with leukocytosis. CT done to eval further and showed diverticulitis. Will tx for same w/ symptomatic treatment.    Pertinent labs & imaging results that were available during my care of the patient were reviewed by me and considered in my medical decision making (see chart for details).  ____________________________________________  FINAL CLINICAL IMPRESSION(S) / ED DIAGNOSES  Final diagnoses:  None     MEDICATIONS GIVEN DURING THIS VISIT:  Medications - No data to display   NEW OUTPATIENT MEDICATIONS STARTED DURING THIS VISIT:  New Prescriptions   No medications on file    Note:  This note was prepared with assistance of Dragon voice  recognition software. Occasional wrong-word or sound-a-like substitutions may have occurred due to the inherent limitations of voice recognition software.   Nesreen Albano, Barbara Cower, MD 07/24/18 [phone]

## 2018-07-19 NOTE — ED Notes (Signed)
DC after Flagyl

## 2018-07-19 NOTE — ED Notes (Signed)
ED Provider at bedside. 

## 2018-07-19 NOTE — ED Notes (Signed)
Pt disconnected from the monitor to go to restroom

## 2018-07-19 NOTE — ED Notes (Signed)
Provided patient a ham sandwich and coke w/ice. Permission given by Dr. Shela Commons. [place].

## 2018-07-25 ENCOUNTER — Ambulatory Visit (INDEPENDENT_AMBULATORY_CARE_PROVIDER_SITE_OTHER): Payer: Medicaid Other | Admitting: Family Medicine

## 2018-07-25 ENCOUNTER — Encounter: Payer: Self-pay | Admitting: Family Medicine

## 2018-07-25 VITALS — BP 132/74 | HR 74 | Ht 66.0 in | Wt 165.0 lb

## 2018-07-25 DIAGNOSIS — Z09 Encounter for follow-up examination after completed treatment for conditions other than malignant neoplasm: Secondary | ICD-10-CM

## 2018-07-25 DIAGNOSIS — R110 Nausea: Secondary | ICD-10-CM | POA: Diagnosis not present

## 2018-07-25 DIAGNOSIS — R197 Diarrhea, unspecified: Secondary | ICD-10-CM | POA: Diagnosis not present

## 2018-07-25 DIAGNOSIS — M25532 Pain in left wrist: Secondary | ICD-10-CM

## 2018-07-25 DIAGNOSIS — K5790 Diverticulosis of intestine, part unspecified, without perforation or abscess without bleeding: Secondary | ICD-10-CM

## 2018-07-25 DIAGNOSIS — R05 Cough: Secondary | ICD-10-CM

## 2018-07-25 DIAGNOSIS — F203 Undifferentiated schizophrenia: Secondary | ICD-10-CM

## 2018-07-25 DIAGNOSIS — R059 Cough, unspecified: Secondary | ICD-10-CM

## 2018-07-25 LAB — POCT URINALYSIS DIP (MANUAL ENTRY)
Bilirubin, UA: NEGATIVE
Blood, UA: NEGATIVE
Glucose, UA: NEGATIVE mg/dL
Ketones, POC UA: NEGATIVE mg/dL
Nitrite, UA: NEGATIVE
Protein Ur, POC: NEGATIVE mg/dL
Spec Grav, UA: 1.02 (ref 1.010–1.025)
Urobilinogen, UA: 0.2 E.U./dL
pH, UA: 6.5 (ref 5.0–8.0)

## 2018-07-25 NOTE — Progress Notes (Signed)
Hospital Follow Up  Subjective:    Patient ID: Felicia Collier, female    DOB: 04-Mar-1956, 62 y.o.   MRN: [id number]   Chief Complaint  Patient presents with  . Follow-up    chronic condition   HPI  Felicia Collier is a 62 year old female with a past medical history of Schizophrenia, Chronic Knee Pain, and Chronic Back Pain. She is here for Hospital Follow Up.  Current Status: Since her last office visit, she is doing well with no complaints. She recently had a ED visit on 07/19/2018 with Diverticulitis. Since then, she has had occasional nausea and diarrhea. No reports of any other GI problems such as abdominal pain, vomiting, and constipation. She has no reports of blood in stools, dysuria and hematuria.   She denies fevers, chills, fatigue, recent infections, weight loss, and night sweats. She has not had any headaches, visual changes, dizziness, and falls. No chest pain, heart palpitations, cough and shortness of breath reported. No depression or anxiety, and denies suicidal ideations, homicidal ideations, or auditory hallucinations. She denies pain today.   Past Medical History:  Diagnosis Date  . Chronic back pain   . Chronic knee pain   . Diverticulitis   . Schizophrenia (HCC)     Family History  Problem Relation Age of Onset  . Other Mother        no health conditions per patient  . Other Father        no health conditions per patient    Social History   Socioeconomic History  . Marital status: Single    Spouse name: Not on file  . Number of children: Not on file  . Years of education: Not on file  . Highest education level: Not on file  Occupational History  . Not on file  Social Needs  . Financial resource strain: Not on file  . Food insecurity:    Worry: Not on file    Inability: Not on file  . Transportation needs:    Medical: Not on file    Non-medical: Not on file  Tobacco Use  . Smoking status: Never Smoker  . Smokeless tobacco: Never Used  Substance and  Sexual Activity  . Alcohol use: No  . Drug use: No  . Sexual activity: Not Currently  Lifestyle  . Physical activity:    Days per week: Not on file    Minutes per session: Not on file  . Stress: Not on file  Relationships  . Social connections:    Talks on phone: Not on file    Gets together: Not on file    Attends religious service: Not on file    Active member of club or organization: Not on file    Attends meetings of clubs or organizations: Not on file    Relationship status: Not on file  . Intimate partner violence:    Fear of current or ex partner: Not on file    Emotionally abused: Not on file    Physically abused: Not on file    Forced sexual activity: Not on file  Other Topics Concern  . Not on file  Social History Narrative  . Not on file    Past Surgical History:  Procedure Laterality Date  . ABDOMINAL HYSTERECTOMY    . CHOLECYSTECTOMY    . WRIST SURGERY Left     Immunization History  Administered Date(s) Administered  . Influenza,inj,Quad PF,6+ Mos 09/26/2017  . Pneumococcal Conjugate-13 12/13/2016  . Tdap  05/24/2016   No outpatient medications have been marked as taking for the 07/25/18 encounter (Office Visit) with Kallie Locks, FNP.    Allergies  Allergen Reactions  . Lidocaine     Face swelling     BP 132/74 (BP Location: Left Arm, Patient Position: Sitting, Cuff Size: Small)   Pulse 74   Ht 5\' 6"  (1.676 m)   Wt 165 lb (74.8 kg)   SpO2 98%   BMI 26.63 kg/m   Review of Systems  Constitutional: Negative.   HENT: Negative.   Eyes: Negative.   Respiratory: Positive for cough (occasional ).   Cardiovascular: Negative.   Gastrointestinal: Positive for diarrhea (mild ) and nausea (mild).  Endocrine: Negative.   Genitourinary: Negative.   Musculoskeletal: Positive for arthralgias (generalized pain).  Skin: Negative.   Allergic/Immunologic: Negative.   Neurological: Negative.   Hematological: Negative.   Psychiatric/Behavioral:  Negative.     Objective:   Physical Exam  Cardiovascular: Normal rate, regular rhythm, normal heart sounds and intact distal pulses.  Pulmonary/Chest: Effort normal and breath sounds normal.  Abdominal: Soft. Bowel sounds are normal.  Psychiatric: She has a normal mood and affect. Her behavior is normal. Judgment and thought content normal.  Anxious    Assessment & Plan:   1. Hospital discharge follow-up  2. Diverticulosis Stable today. CT scan confirmed on 07/19/2018.   Recommendations for treatment of Diverticulosis:  Eating a high-fiber diet. This may include eating more fruits, vegetables, and grains.  Taking a fiber supplement.  Taking a live bacteria supplement (probiotic).  Taking medicine to relax your colon.  Taking antibiotic medicines.  3. Diarrhea, unspecified type Stable.   4. Nausea Resolved.   5. Undifferentiated schizophrenia (HCC) She will continue follow up with [facility] as needed.   6. Left wrist pain Stable, not worsening.   7. Cough Stable. Continue OTC cough med as needed.   8. Follow up She will keep previously scheduled follow appointment 12/2018. - POCT urinalysis dipstick  No orders of the defined types were placed in this encounter.  Raliegh Ip,  MSN, FNP-C Patient Care [facility] [address] [place], [place] [address] [phone]

## 2018-07-25 NOTE — Patient Instructions (Signed)
Diverticulosis  Diverticulosis is a condition that develops when small pouches (diverticula) form in the wall of the large intestine (colon). The colon is where water is absorbed and stool is formed. The pouches form when the inside layer of the colon pushes through weak spots in the outer layers of the colon. You may have a few pouches or many of them.  What are the causes?  The cause of this condition is not known.  What increases the risk?  The following factors may make you more likely to develop this condition:   Being older than age 60. Your risk for this condition increases with age. Diverticulosis is rare among people younger than age 30. By age 80, many people have it.   Eating a low-fiber diet.   Having frequent constipation.   Being overweight.   Not getting enough exercise.   Smoking.   Taking over-the-counter pain medicines, like aspirin and ibuprofen.   Having a family history of diverticulosis.    What are the signs or symptoms?  In most people, there are no symptoms of this condition. If you do have symptoms, they may include:   Bloating.   Cramps in the abdomen.   Constipation or diarrhea.   Pain in the lower left side of the abdomen.    How is this diagnosed?  This condition is most often diagnosed during an exam for other colon problems. Because diverticulosis usually has no symptoms, it often cannot be diagnosed independently. This condition may be diagnosed by:   Using a flexible scope to examine the colon (colonoscopy).   Taking an X-ray of the colon after dye has been put into the colon (barium enema).   Doing a CT scan.    How is this treated?  You may not need treatment for this condition if you have never developed an infection related to diverticulosis. If you have had an infection before, treatment may include:   Eating a high-fiber diet. This may include eating more fruits, vegetables, and grains.   Taking a fiber supplement.   Taking a live bacteria supplement  (probiotic).   Taking medicine to relax your colon.   Taking antibiotic medicines.    Follow these instructions at home:   Drink 6-8 glasses of water or more each day to prevent constipation.   Try not to strain when you have a bowel movement.   If you have had an infection before:  ? Eat more fiber as directed by your health care provider or your diet and nutrition specialist (dietitian).  ? Take a fiber supplement or probiotic, if your health care provider approves.   Take over-the-counter and prescription medicines only as told by your health care provider.   If you were prescribed an antibiotic, take it as told by your health care provider. Do not stop taking the antibiotic even if you start to feel better.   Keep all follow-up visits as told by your health care provider. This is important.  Contact a health care provider if:   You have pain in your abdomen.   You have bloating.   You have cramps.   You have not had a bowel movement in 3 days.  Get help right away if:   Your pain gets worse.   Your bloating becomes very bad.   You have a fever or chills, and your symptoms suddenly get worse.   You vomit.   You have bowel movements that are bloody or black.   You have   bleeding from your rectum.  Summary   Diverticulosis is a condition that develops when small pouches (diverticula) form in the wall of the large intestine (colon).   You may have a few pouches or many of them.   This condition is most often diagnosed during an exam for other colon problems.   If you have had an infection related to diverticulosis, treatment may include increasing the fiber in your diet, taking supplements, or taking medicines.  This information is not intended to replace advice given to you by your health care provider. Make sure you discuss any questions you have with your health care provider.  Document Released: 07/29/2004 Document Revised: 09/20/2016 Document Reviewed: 09/20/2016  Elsevier Interactive  Patient Education  2017 [employer]vier [employer]

## 2018-08-14 ENCOUNTER — Other Ambulatory Visit: Payer: Self-pay

## 2018-08-15 ENCOUNTER — Emergency Department (HOSPITAL_COMMUNITY)
Admission: EM | Admit: 2018-08-15 | Discharge: 2018-08-15 | Disposition: A | Discharge status: Home / Self Care | Payer: Medicaid Other | Attending: Emergency Medicine | Admitting: Emergency Medicine

## 2018-08-15 ENCOUNTER — Other Ambulatory Visit: Payer: Self-pay

## 2018-08-15 ENCOUNTER — Encounter (HOSPITAL_COMMUNITY): Payer: Self-pay | Admitting: *Deleted

## 2018-08-15 DIAGNOSIS — G8918 Other acute postprocedural pain: Secondary | ICD-10-CM | POA: Insufficient documentation

## 2018-08-15 MED ORDER — OXYCODONE-ACETAMINOPHEN 5-325 MG PO TABS
ORAL_TABLET | ORAL | Status: AC
  Filled 2018-08-15: qty 1

## 2018-08-15 MED ORDER — OXYCODONE-ACETAMINOPHEN 5-325 MG PO TABS
1.0000 | ORAL_TABLET | ORAL | Status: DC | PRN
  Administered 2018-08-15: 1 via ORAL

## 2018-08-15 NOTE — ED Notes (Signed)
Patient walked out of room with all belongings and headed for the lobby. Fellow nurse took notice and called out to patient and asked where she was going and patient stated she was leaving. Dr. Read Drivers made aware.

## 2018-08-15 NOTE — ED Notes (Signed)
Bed: WA09 Expected date:  Expected time:  Means of arrival:  Comments: 62 yo F/ wrist pain from sx

## 2018-08-15 NOTE — ED Triage Notes (Signed)
Per EMS, pt from home, pt is s/p L wrist carpal tunnel release.  No pain meds prescribed.  Surgery was yesterday at 0700.  Splint noted.

## 2018-08-22 ENCOUNTER — Emergency Department (HOSPITAL_COMMUNITY)
Admission: EM | Admit: 2018-08-22 | Discharge: 2018-08-22 | Disposition: A | Discharge status: Home / Self Care | Payer: Medicaid Other | Attending: Emergency Medicine | Admitting: Emergency Medicine

## 2018-08-22 ENCOUNTER — Other Ambulatory Visit: Payer: Self-pay

## 2018-08-22 ENCOUNTER — Encounter (HOSPITAL_COMMUNITY): Payer: Self-pay | Admitting: *Deleted

## 2018-08-22 DIAGNOSIS — R1032 Left lower quadrant pain: Secondary | ICD-10-CM | POA: Diagnosis present

## 2018-08-22 DIAGNOSIS — K5732 Diverticulitis of large intestine without perforation or abscess without bleeding: Secondary | ICD-10-CM | POA: Diagnosis not present

## 2018-08-22 DIAGNOSIS — E876 Hypokalemia: Secondary | ICD-10-CM | POA: Insufficient documentation

## 2018-08-22 DIAGNOSIS — Z79899 Other long term (current) drug therapy: Secondary | ICD-10-CM | POA: Insufficient documentation

## 2018-08-22 LAB — CBC WITH DIFFERENTIAL/PLATELET
BASOS PCT: 0 %
Basophils Absolute: 0 10*3/uL (ref 0.0–0.1)
Eosinophils Absolute: 0.3 10*3/uL (ref 0.0–0.5)
Eosinophils Relative: 2 %
HEMATOCRIT: 38.3 % (ref 36.0–46.0)
HEMOGLOBIN: 12 g/dL (ref 12.0–15.0)
LYMPHS ABS: 1.2 10*3/uL (ref 0.7–4.0)
LYMPHS PCT: 9 %
MCH: 26 pg (ref 26.0–34.0)
MCHC: 31.3 g/dL (ref 30.0–36.0)
MCV: 82.9 fL (ref 80.0–100.0)
Monocytes Absolute: 1.1 10*3/uL — ABNORMAL HIGH (ref 0.1–1.0)
Monocytes Relative: 8 %
NEUTROS ABS: 11.1 10*3/uL — AB (ref 1.7–7.7)
NEUTROS PCT: 81 %
Platelets: 398 10*3/uL (ref 150–400)
RBC: 4.62 MIL/uL (ref 3.87–5.11)
RDW: 13.9 % (ref 11.5–15.5)
WBC: 13.7 10*3/uL — ABNORMAL HIGH (ref 4.0–10.5)

## 2018-08-22 LAB — URINALYSIS, ROUTINE W REFLEX MICROSCOPIC
Bilirubin Urine: NEGATIVE
Glucose, UA: NEGATIVE mg/dL
Hgb urine dipstick: NEGATIVE
Ketones, ur: NEGATIVE mg/dL
NITRITE: NEGATIVE
Protein, ur: NEGATIVE mg/dL
SPECIFIC GRAVITY, URINE: 1.018 (ref 1.005–1.030)
pH: 6 (ref 5.0–8.0)

## 2018-08-22 LAB — BASIC METABOLIC PANEL
Anion gap: 10 (ref 5–15)
BUN: 10 mg/dL (ref 8–23)
CHLORIDE: 104 mmol/L (ref 98–111)
CO2: 29 mmol/L (ref 22–32)
Calcium: 9 mg/dL (ref 8.9–10.3)
Creatinine, Ser: 0.8 mg/dL (ref 0.44–1.00)
GFR calc non Af Amer: >60 mL/min (ref >60)
Glucose, Bld: 108 mg/dL — ABNORMAL HIGH (ref 70–99)
Potassium: 3.1 mmol/L — ABNORMAL LOW (ref 3.5–5.1)
Sodium: 143 mmol/L (ref 135–145)

## 2018-08-22 MED ORDER — METRONIDAZOLE 500 MG PO TABS
500.0000 mg | ORAL_TABLET | Freq: Once | ORAL | Status: AC
  Administered 2018-08-22: 500 mg via ORAL
  Filled 2018-08-22: qty 1

## 2018-08-22 MED ORDER — POTASSIUM CHLORIDE CRYS ER 20 MEQ PO TBCR
40.0000 meq | EXTENDED_RELEASE_TABLET | Freq: Once | ORAL | Status: AC
  Administered 2018-08-22: 40 meq via ORAL
  Filled 2018-08-22: qty 2

## 2018-08-22 MED ORDER — CIPROFLOXACIN HCL 500 MG PO TABS
500.0000 mg | ORAL_TABLET | Freq: Once | ORAL | Status: AC
  Administered 2018-08-22: 500 mg via ORAL
  Filled 2018-08-22: qty 1

## 2018-08-22 MED ORDER — METRONIDAZOLE 500 MG PO TABS: 500.0000 mg | ORAL_TABLET | Freq: Three times a day (TID) | ORAL | 0 refills | Status: DC

## 2018-08-22 MED ORDER — CIPROFLOXACIN HCL 500 MG PO TABS: 500.0000 mg | ORAL_TABLET | Freq: Two times a day (BID) | ORAL | 0 refills | Status: DC

## 2018-08-22 MED ORDER — POTASSIUM CHLORIDE CRYS ER 20 MEQ PO TBCR: 20.0000 meq | EXTENDED_RELEASE_TABLET | Freq: Two times a day (BID) | ORAL | 0 refills | Status: DC

## 2018-08-22 NOTE — Discharge Instructions (Addendum)
Return if you start running a fever or if you start vomiting.

## 2018-08-22 NOTE — ED Triage Notes (Signed)
Left lower abdominal pain since yesterday. No nausea, normal BM yesterday, no urinary symptoms.

## 2018-08-22 NOTE — ED Provider Notes (Signed)
[facility]-EMERGENCY DEPT Provider Note   CSN: [id number] Arrival date & time: 08/22/18  0428     History   Chief Complaint Chief Complaint  Patient presents with  . Abdominal Pain    HPI Felicia Collier is a 62 y.o. female.  The history is provided by the patient.  She has history of schizophrenia and diverticulitis and comes in complaining of left lower quadrant pain which started 3 days ago.  Pain severity has been stable.  She rates pain at 8/10.  Is worse if she coughs, nothing makes it better.  She denies fever, chills, sweats.  She denies nausea, vomiting, diarrhea.  She was treated for diverticulitis about one month ago, and states that this does feel similar.  She denies urinary urgency, frequency, tenesmus, dysuria.  Past Medical History:  Diagnosis Date  . Chronic back pain   . Chronic knee pain   . Diverticulitis   . Schizophrenia [facility])     Patient Active Problem List   Diagnosis Date Noted  . Diarrhea 07/25/2018  . Pain in left wrist 07/20/2017  . Schizophrenia (HCC) 05/01/2017  . Chronic pain of left knee 02/09/2017  . Unilateral primary osteoarthritis, left knee 02/09/2017  . Neuropathy 11/22/2016  . Gastroesophageal reflux disease without esophagitis 11/22/2016  . Weight gain 11/22/2016  . Atypical chest pain 11/04/2016    Past Surgical History:  Procedure Laterality Date  . ABDOMINAL HYSTERECTOMY    . CHOLECYSTECTOMY    . WRIST SURGERY Left      OB History   None      Home Medications    Prior to Admission medications   Medication Sig Start Date End Date Taking? Authorizing Provider  ciprofloxacin (CIPRO) 500 MG tablet Take 1 tablet (500 mg total) by mouth 2 (two) times daily. Patient not taking: Reported on 07/25/2018 07/19/18   Mesner, Barbara Cower, MD  docusate sodium (COLACE) 100 MG capsule Take 1 capsule (100 mg total) by mouth every 12 (twelve) hours. Patient not taking: Reported on 07/25/2018 07/19/18   Mesner, Barbara Cower, MD    docusate sodium (COLACE) 100 MG capsule Colace 100 mg capsule  Take 1 capsule every day by oral route for 15 days.    [provider]  fluticasone (FLONASE) 50 MCG/ACT nasal spray Place 1 spray into both nostrils daily. Patient not taking: Reported on 07/25/2018 07/02/18   Army Melia A, PA-C  gabapentin (NEURONTIN) 100 MG capsule Take 1 capsule (100 mg total) by mouth 3 (three) times daily. Patient not taking: Reported on 07/25/2018 07/10/18   Kallie Locks, FNP  methocarbamol (ROBAXIN) 500 MG tablet methocarbamol 500 mg tablet  Take 1 tablet 4 times a day by oral route for 15 days.    [provider]  metroNIDAZOLE (FLAGYL) 500 MG tablet Take 1 tablet (500 mg total) by mouth 2 (two) times daily. One po bid x 7 days Patient not taking: Reported on 07/25/2018 07/19/18   Mesner, Barbara Cower, MD  oxyCODONE-acetaminophen (PERCOCET/ROXICET) 5-325 MG tablet oxycodone-acetaminophen 5 mg-325 mg tablet  Take 1 tablet every 6 hours by oral route for 5 days.    [provider]  vitamin C (ASCORBIC ACID) 500 MG tablet Vitamin C 500 mg tablet  Take 1 tablet every day by oral route for 15 days.    [provider]    Family History Family History  Problem Relation Age of Onset  . Other Mother        no health conditions per patient  .  Other Father        no health conditions per patient    Social History Social History   Tobacco Use  . Smoking status: Never Smoker  . Smokeless tobacco: Never Used  Substance Use Topics  . Alcohol use: No  . Drug use: No     Allergies   Lidocaine   Review of Systems Review of Systems  All other systems reviewed and are negative.    Physical Exam Updated Vital Signs BP (!) 146/87   Pulse 90   Temp 98.1 F (36.7 C) (Oral)   Resp 16   SpO2 95%   Physical Exam  Nursing note and vitals reviewed.  62 year old female, resting comfortably and in no acute distress. Vital signs are significant for elevated systolic  blood pressure. Oxygen saturation is 95%, which is normal. Head is normocephalic and atraumatic. PERRLA, EOMI. Oropharynx is clear. Neck is nontender and supple without adenopathy or JVD. Back is nontender and there is no CVA tenderness. Lungs are clear without rales, wheezes, or rhonchi. Chest is nontender. Heart has regular rate and rhythm without murmur. Abdomen is soft, flat, with mild to moderate tenderness in the left lower quadrant.  There is no rebound or guarding.  There are no masses or hepatosplenomegaly and peristalsis is normoactive. Extremities have no cyanosis or edema, full range of motion is present. Skin is warm and dry without rash. Neurologic: Mental status is normal, cranial nerves are intact, there are no motor or sensory deficits.  ED Treatments / Results  Labs (all labs ordered are listed, but only abnormal results are displayed) Labs Reviewed  URINALYSIS, ROUTINE W REFLEX MICROSCOPIC - Abnormal; Notable for the following components:      Result Value   Leukocytes, UA TRACE (*)    Bacteria, UA RARE (*)    All other components within normal limits  CBC WITH DIFFERENTIAL/PLATELET - Abnormal; Notable for the following components:   WBC 13.7 (*)    Neutro Abs 11.1 (*)    Monocytes Absolute 1.1 (*)    All other components within normal limits  BASIC METABOLIC PANEL - Abnormal; Notable for the following components:   Potassium 3.1 (*)    Glucose, Bld 108 (*)    All other components within normal limits   Procedures Procedures   Medications Ordered in ED Medications  potassium chloride SA (K-DUR,KLOR-CON) CR tablet 40 mEq (40 mEq Oral Given 08/22/18 0609)  ciprofloxacin (CIPRO) tablet 500 mg (500 mg Oral Given 08/22/18 0609)  metroNIDAZOLE (FLAGYL) tablet 500 mg (500 mg Oral Given 08/22/18 0609)     Initial Impression / Assessment and Plan / ED Course  I have reviewed the triage vital signs and the nursing notes.  Pertinent labs & imaging results that were  available during my care of the patient were reviewed by me and considered in my medical decision making (see chart for details).  Left lower quadrant pain and patient with known history of diverticulitis.  Old records are reviewed showing ED visit September for at which time CT scan showed sigmoid diverticulitis.  No aneurysm was seen.  Most likely this is recurrence of diverticulitis.  Will check urinalysis to make sure and does not have a urinary tract infection.  Will check CBC.  At this point, I do not see indications for repeat CT scanning.  Abdomen is benign and she is afebrile, very low risk of significant complications of diverticulitis.   Urinalysis is unremarkable. Potassium has come back low, and  she is given oral potassium. She is discharged with prescriptions for metronidazole and ciprofloxacin as well as K-Dur. Follow up with PCP in one week.  Final Clinical Impressions(s) / ED Diagnoses   Final diagnoses:  Diverticulitis of sigmoid colon  Hypokalemia    ED Discharge Orders         Ordered    ciprofloxacin (CIPRO) 500 MG tablet  2 times daily     08/22/18 0626    metroNIDAZOLE (FLAGYL) 500 MG tablet  3 times daily     08/22/18 0626    potassium chloride SA (K-DUR,KLOR-CON) 20 MEQ tablet  2 times daily     08/22/18 4098           Dione Booze, MD 08/27/18 2237

## 2018-08-25 ENCOUNTER — Encounter: Payer: Self-pay | Admitting: Family Medicine

## 2018-08-25 ENCOUNTER — Ambulatory Visit (INDEPENDENT_AMBULATORY_CARE_PROVIDER_SITE_OTHER): Payer: Medicaid Other | Admitting: Family Medicine

## 2018-08-25 VITALS — BP 130/72 | HR 80 | Temp 97.8°F | Ht 65.0 in | Wt 162.4 lb

## 2018-08-25 DIAGNOSIS — G5602 Carpal tunnel syndrome, left upper limb: Secondary | ICD-10-CM

## 2018-08-25 DIAGNOSIS — Z09 Encounter for follow-up examination after completed treatment for conditions other than malignant neoplasm: Secondary | ICD-10-CM

## 2018-08-25 DIAGNOSIS — R110 Nausea: Secondary | ICD-10-CM | POA: Diagnosis not present

## 2018-08-25 DIAGNOSIS — R1084 Generalized abdominal pain: Secondary | ICD-10-CM | POA: Diagnosis not present

## 2018-08-25 MED ORDER — ONDANSETRON HCL 4 MG PO TABS: 4.0000 mg | ORAL_TABLET | Freq: Three times a day (TID) | ORAL | 1 refills | Status: DC | PRN

## 2018-08-25 NOTE — Progress Notes (Signed)
  Hospital Follow Up  Subjective:    Patient ID: Felicia Collier, female    DOB: [birth year], 62 y.o.   MRN: [id number]  HPI  Felicia Collier is a 62 year old female with a past medical history of Schizophrenia, Diverticulitis, Chronic Knee Pain, and Chronic Back Pain. She is here today for Hospital Follow Up.   Current Status: Since her last office visit, she is doing well with no complaints. She had recent carpal tunnel surgery of her left hand. She is wearing a cast, which will be removed on 08/29/2018. She tolerated surgery well with no problems.   She denies fevers, chills, fatigue, recent infections, weight loss, and night sweats. She has not had any headaches, visual changes, dizziness, and falls. No chest pain, heart palpitations, cough and shortness of breath reported. No reports of GI problems such as nausea, vomiting, diarrhea, and constipation. She has no reports of blood in stools, dysuria and hematuria. No depression or anxiety, and denies suicidal ideations, homicidal ideations, or auditory hallucinations. She denies pain today.   Review of Systems  HENT: Negative.   Respiratory: Negative.   Cardiovascular: Negative.   Gastrointestinal: Negative.   Genitourinary: Negative.   Musculoskeletal: Negative.   Skin: Negative.   Neurological: Negative.   Psychiatric/Behavioral: Negative.    Objective:   Physical Exam  Constitutional: She appears well-developed and well-nourished.    Cardiovascular: Normal rate, regular rhythm, normal heart sounds and intact distal pulses.  Pulmonary/Chest: Effort normal and breath sounds normal.  Abdominal: Soft. Bowel sounds are normal. She exhibits distension (Obese).  Psychiatric: She has a normal mood and affect. Her behavior is normal. Judgment and thought content normal.  Nursing note and vitals reviewed.  Assessment & Plan:   1. Hospital discharge follow-up  2. Carpal tunnel syndrome of left wrist S/p: left wrist carpal tunnel surgery.  Cast to be removed next week.   3. Abdominal discomfort, generalized Stable today.  - Urinalysis  4. Nausea - ondansetron (ZOFRAN) 4 MG tablet; Take 1 tablet (4 mg total) by mouth every 8 (eight) hours as needed for nausea or vomiting.  Dispense: 20 tablet; Refill: 1  5. Follow up She will keep previous scheduled follow up appointment.   Meds ordered this encounter  Medications  . ondansetron (ZOFRAN) 4 MG tablet    Sig: Take 1 tablet (4 mg total) by mouth every 8 (eight) hours as needed for nausea or vomiting.    Dispense:  20 tablet    Refill:  1   Raliegh Ip,  MSN, FNP-C Patient [facility] [address] [place], [place] [address] [phone]

## 2018-08-25 NOTE — Patient Instructions (Signed)
Ondansetron tablets What is this medicine? ONDANSETRON (on DAN se tron) is used to treat nausea and vomiting caused by chemotherapy. It is also used to prevent or treat nausea and vomiting after surgery. This medicine may be used for other purposes; ask your health care provider or pharmacist if you have questions. COMMON BRAND NAME(S): Zofran What should I tell my health care provider before I take this medicine? They need to know if you have any of these conditions: -heart disease -history of irregular heartbeat -liver disease -low levels of magnesium or potassium in the blood -an unusual or allergic reaction to ondansetron, granisetron, other medicines, foods, dyes, or preservatives -pregnant or trying to get pregnant -breast-feeding How should I use this medicine? Take this medicine by mouth with a glass of water. Follow the directions on your prescription label. Take your doses at regular intervals. Do not take your medicine more often than directed. Talk to your pediatrician regarding the use of this medicine in children. Special care may be needed. Overdosage: If you think you have taken too much of this medicine contact a poison control center or emergency room at once. NOTE: This medicine is only for you. Do not share this medicine with others. What if I miss a dose? If you miss a dose, take it as soon as you can. If it is almost time for your next dose, take only that dose. Do not take double or extra doses. What may interact with this medicine? Do not take this medicine with any of the following medications: -apomorphine -certain medicines for fungal infections like fluconazole, itraconazole, ketoconazole, posaconazole, voriconazole -cisapride -dofetilide -dronedarone -pimozide -thioridazine -ziprasidone This medicine may also interact with the following medications: -carbamazepine -certain medicines for depression, anxiety, or psychotic  disturbances -fentanyl -linezolid -MAOIs like Carbex, Eldepryl, Marplan, Nardil, and Parnate -methylene blue (injected into a vein) -other medicines that prolong the QT interval (cause an abnormal heart rhythm) -phenytoin -rifampicin -tramadol This list may not describe all possible interactions. Give your health care provider a list of all the medicines, herbs, non-prescription drugs, or dietary supplements you use. Also tell them if you smoke, drink alcohol, or use illegal drugs. Some items may interact with your medicine. What should I watch for while using this medicine? Check with your doctor or health care professional right away if you have any sign of an allergic reaction. What side effects may I notice from receiving this medicine? Side effects that you should report to your doctor or health care professional as soon as possible: -allergic reactions like skin rash, itching or hives, swelling of the face, lips or tongue -breathing problems -confusion -dizziness -fast or irregular heartbeat -feeling faint or lightheaded, falls -fever and chills -loss of balance or coordination -seizures -sweating -swelling of the hands or feet -tightness in the chest -tremors -unusually weak or tired Side effects that usually do not require medical attention (report to your doctor or health care professional if they continue or are bothersome): -constipation or diarrhea -headache This list may not describe all possible side effects. Call your doctor for medical advice about side effects. You may report side effects to FDA at 1-800-FDA-1088. Where should I keep my medicine? Keep out of the reach of children. Store between 2 and 30 degrees C (36 and 86 degrees F). Throw away any unused medicine after the expiration date. NOTE: This sheet is a summary. It may not cover all possible information. If you have questions about this medicine, talk to your doctor, pharmacist,   or health care  provider.  2018 Elsevier/Gold Standard (2013-08-08 16:27:45)  

## 2018-08-26 LAB — URINALYSIS
Bilirubin, UA: NEGATIVE
Glucose, UA: NEGATIVE
Ketones, UA: NEGATIVE
Nitrite, UA: NEGATIVE
Protein, UA: NEGATIVE
RBC, UA: NEGATIVE
Specific Gravity, UA: 1.027 (ref 1.005–1.030)
Urobilinogen, Ur: 0.2 mg/dL (ref 0.2–1.0)
pH, UA: 5 (ref 5.0–7.5)

## 2018-09-22 ENCOUNTER — Emergency Department (HOSPITAL_COMMUNITY)
Admission: EM | Admit: 2018-09-22 | Discharge: 2018-09-22 | Disposition: A | Discharge status: Home / Self Care | Payer: Medicaid Other | Attending: Emergency Medicine | Admitting: Emergency Medicine

## 2018-09-22 ENCOUNTER — Encounter (HOSPITAL_COMMUNITY): Payer: Self-pay | Admitting: Emergency Medicine

## 2018-09-22 DIAGNOSIS — G8929 Other chronic pain: Secondary | ICD-10-CM | POA: Diagnosis not present

## 2018-09-22 DIAGNOSIS — M79671 Pain in right foot: Secondary | ICD-10-CM | POA: Diagnosis not present

## 2018-09-22 DIAGNOSIS — M25562 Pain in left knee: Secondary | ICD-10-CM | POA: Diagnosis not present

## 2018-09-22 DIAGNOSIS — M79672 Pain in left foot: Secondary | ICD-10-CM | POA: Insufficient documentation

## 2018-09-22 DIAGNOSIS — Z79899 Other long term (current) drug therapy: Secondary | ICD-10-CM | POA: Insufficient documentation

## 2018-09-22 MED ORDER — GABAPENTIN 300 MG PO CAPS: 300.0000 mg | ORAL_CAPSULE | Freq: Two times a day (BID) | ORAL | 0 refills | Status: DC

## 2018-09-22 NOTE — Discharge Instructions (Addendum)
Your knee pain is due to arthritis which was seen on your last x-rays from July 2019. You will need to follow-up with your orthopedist, Dr. Magnus Ivan about this. Continue to take your pain medication as prescribed for it. If you are out, you can take Tylenol and/or Ibuprofen.  The pain in your feet may be due to nerve damage. I have prescribed you Neurontin which helps with nerve pain. You can begin taking this daily and follow-up with your PCP about this.  Thank you for allowing [place] to take care of you today.

## 2018-09-22 NOTE — ED Triage Notes (Signed)
Patient here from home with complaints of left knee pain and bilateral foot pain that started 1 month ago. Ambulatory.

## 2018-09-22 NOTE — ED Provider Notes (Signed)
[facility]  CSN: [id number] Arrival date & time: 09/22/18  1440    History   Chief Complaint Chief Complaint  Patient presents with  . Knee Pain  . Foot Pain    HPI Felicia Collier is a 62 y.o. female with a medical history of chronic back and knee pain who presented to the ED for   Leg Pain   This is a chronic problem. Episode onset: 3-4 years. The problem occurs constantly. The problem has not changed since onset.The pain is present in the left knee, left foot and right foot. Quality: Left knee is aching and stiff. Bilateral foot pain describe as stinging that starts at bottom of feet and goes to the ankle. The pain is moderate. Associated symptoms include stiffness. Pertinent negatives include no numbness, full range of motion, no tingling and no itching. The symptoms are aggravated by activity and standing. Treatments tried: pain medication. The treatment provided mild relief. There has been no history of extremity trauma. Family history is significant for no rheumatoid arthritis and no gout.    Past Medical History:  Diagnosis Date  . Chronic back pain   . Chronic knee pain   . Diverticulitis   . Schizophrenia [facility])     Patient Active Problem List   Diagnosis Date Noted  . Diarrhea 07/25/2018  . Pain in left wrist 07/20/2017  . Schizophrenia (HCC) 05/01/2017  . Chronic pain of left knee 02/09/2017  . Unilateral primary osteoarthritis, left knee 02/09/2017  . Neuropathy 11/22/2016  . Gastroesophageal reflux disease without esophagitis 11/22/2016  . Weight gain 11/22/2016  . Atypical chest pain 11/04/2016    Past Surgical History:  Procedure Laterality Date  . ABDOMINAL HYSTERECTOMY    . CHOLECYSTECTOMY    . WRIST SURGERY Left      OB History   None      Home Medications    Prior to Admission medications   Medication Sig Start Date End Date Taking? Authorizing Provider  ciprofloxacin (CIPRO) 500 MG tablet  Take 1 tablet (500 mg total) by mouth 2 (two) times daily. 08/22/18   Dione Booze, MD  methocarbamol (ROBAXIN) 500 MG tablet Take 500 mg by mouth every 8 (eight) hours as needed for muscle spasms.     [provider]  metroNIDAZOLE (FLAGYL) 500 MG tablet Take 1 tablet (500 mg total) by mouth 3 (three) times daily. 08/22/18   Dione Booze, MD  ondansetron (ZOFRAN) 4 MG tablet Take 1 tablet (4 mg total) by mouth every 8 (eight) hours as needed for nausea or vomiting. 08/25/18   Kallie Locks, FNP  oxyCODONE-acetaminophen (PERCOCET/ROXICET) 5-325 MG tablet Take 1 tablet by mouth every 6 (six) hours as needed for moderate pain.     [provider]  potassium chloride SA (K-DUR,KLOR-CON) 20 MEQ tablet Take 1 tablet (20 mEq total) by mouth 2 (two) times daily. 08/22/18   Dione Booze, MD  vitamin C (ASCORBIC ACID) 500 MG tablet Take 500 mg by mouth daily.     [provider]    Family History Family History  Problem Relation Age of Onset  . Other Mother        no health conditions per patient  . Other Father        no health conditions per patient    Social History Social History   Tobacco Use  . Smoking status: Never Smoker  . Smokeless tobacco: Never Used  Substance Use Topics  .  Alcohol use: No  . Drug use: No     Allergies   Lidocaine   Review of Systems Review of Systems  Musculoskeletal: Positive for arthralgias and stiffness. Negative for gait problem, joint swelling and myalgias.  Skin: Negative for itching.  Neurological: Negative for tingling, weakness and numbness.  All other systems reviewed and are negative.    Physical Exam Updated Vital Signs BP 138/80 (BP Location: Right Arm)   Pulse 70   Temp 97.7 F (36.5 C) (Oral)   Resp 19   SpO2 98%   Physical Exam  Constitutional: Vital signs are normal. She appears well-developed and well-nourished. She is cooperative. No distress.  Cardiovascular:  Pulses:      Dorsalis pedis  pulses are 2+ on the right side, and 2+ on the left side.       Posterior tibial pulses are 2+ on the right side, and 2+ on the left side.  Musculoskeletal:       Right foot: There is normal range of motion and no deformity.       Left foot: There is normal range of motion and no deformity.  Full ROM of lower extremities bilaterally with 5/5 strength. Able to ambulate and bear weight without pain, issue or assistance. Left knee joint is non-tender to palpation.  Feet:  Right Foot:  Skin Integrity: Negative for erythema or warmth.  Left Foot:  Skin Integrity: Negative for erythema or warmth.  Neurological: She is alert. She has normal strength. She exhibits normal muscle tone.  Reflex Scores:      Patellar reflexes are 1+ on the right side and 1+ on the left side.      Achilles reflexes are 1+ on the right side and 1+ on the left side. Skin: Skin is intact. Capillary refill takes less than 2 seconds.  Nursing note and vitals reviewed.  ED Treatments / Results  Labs (all labs ordered are listed, but only abnormal results are displayed) Labs Reviewed - No data to display  EKG None  Radiology No results found.  Procedures Procedures (including critical care time)  Medications Ordered in ED Medications - No data to display   Initial Impression / Assessment and Plan / ED Course  Triage vital signs and the nursing notes have been reviewed.  Pertinent labs & imaging results that were available during care of the patient were reviewed and considered in medical decision making (see chart for details).   Patient presents with chronic and unchanged complaints of left knee and bilateral foot pain which she reports has been present for 3-4 years. Patient has full sensation in lower extremities bilaterally. She also has full active and passive ROM. No deformities, decreased muscle tone or other abnormalities visualized. Neurovascular function is intact. There is no recent history of trauma  or injury that would warrant imaging to be performed today. There are no other physical exam findings or s/s that suggest an underlying infectious or rheumatologic process that warrant further evaluation or intervention today. Patient has established care with ortho who she can follow-up with for these concerns.  Final Clinical Impressions(s) / ED Diagnoses  1. Chronic Knee Pain. Past imaging shows medial and lateral OA. Education provided on OTC and supportive treatment for relief. Advised to follow-up with orthopedic doctor. 2. Bilateral Foot Pain. Possibly neuropathic. Rx for Neurontin prescribed to assist. Advised to follow-up with PCP.  Dispo: Home. After thorough clinical evaluation, this patient is determined to be medically stable and can be safely discharged  with the previously mentioned treatment and/or outpatient follow-up/referral(s). At this time, there are no other apparent medical conditions that require further screening, evaluation or treatment.   Final diagnoses:  Chronic pain of left knee  Pain in both feet    ED Discharge Orders         Ordered    gabapentin (NEURONTIN) 300 MG capsule  2 times daily     09/22/18 1539            Jeshua Ransford, Pilger I, PA-C 09/22/18 1541    Doug Sou, MD 09/22/18 [phone]

## 2018-10-02 ENCOUNTER — Emergency Department (HOSPITAL_COMMUNITY)
Admission: EM | Admit: 2018-10-02 | Discharge: 2018-10-03 | Disposition: A | Discharge status: Home / Self Care | Payer: Medicaid Other | Attending: Emergency Medicine | Admitting: Emergency Medicine

## 2018-10-02 DIAGNOSIS — Z5321 Procedure and treatment not carried out due to patient leaving prior to being seen by health care provider: Secondary | ICD-10-CM | POA: Insufficient documentation

## 2018-10-02 DIAGNOSIS — M549 Dorsalgia, unspecified: Secondary | ICD-10-CM | POA: Insufficient documentation

## 2018-10-03 ENCOUNTER — Other Ambulatory Visit: Payer: Self-pay

## 2018-10-03 ENCOUNTER — Encounter (HOSPITAL_COMMUNITY): Payer: Self-pay | Admitting: Emergency Medicine

## 2018-10-03 ENCOUNTER — Emergency Department (HOSPITAL_COMMUNITY)
Admission: EM | Admit: 2018-10-03 | Discharge: 2018-10-03 | Disposition: A | Discharge status: Home / Self Care | Payer: Medicaid Other | Source: Home / Self Care | Attending: Emergency Medicine | Admitting: Emergency Medicine

## 2018-10-03 ENCOUNTER — Emergency Department (HOSPITAL_COMMUNITY): Payer: Medicaid Other

## 2018-10-03 DIAGNOSIS — M546 Pain in thoracic spine: Secondary | ICD-10-CM

## 2018-10-03 MED ORDER — ACETAMINOPHEN 325 MG PO TABS
650.0000 mg | ORAL_TABLET | Freq: Once | ORAL | Status: AC
  Administered 2018-10-03: 650 mg via ORAL
  Filled 2018-10-03: qty 2

## 2018-10-03 MED ORDER — LIDOCAINE 5 % EX PTCH
1.0000 | MEDICATED_PATCH | CUTANEOUS | Status: DC
  Administered 2018-10-03: 1 via TRANSDERMAL
  Filled 2018-10-03: qty 1

## 2018-10-03 NOTE — ED Notes (Signed)
Pt called for room x1 no answer  

## 2018-10-03 NOTE — Discharge Instructions (Signed)
You were seen here today for Back Pain: Low back pain is discomfort in the lower back that may be due to injuries to muscles and ligaments around the spine. Occasionally, it may be caused by a problem to a part of the spine called a disc. Your back pain should be treated with medicines listed below as well as back exercises and this back pain should get better over the next 2 weeks. Most patients get completely well in 4 weeks. It is important to know however, if you develop severe or worsening pain, low back pain with fever, numbness, weakness or inability to walk or urinate, you should return to the ER immediately.  Please follow up with your doctor this week for a recheck if still having symptoms.  HOME INSTRUCTIONS Self - care:  The application of heat can help soothe the pain.  Maintaining your daily activities, including walking (this is encouraged), as it will help you get better faster than just staying in bed. Do not life, push, pull anything more than 10 pounds for the next week. I am attaching back exercises that you can do at home to help facilitate your recovery.   Back Exercises - I have attached a handout on back exercises that can be done at home to help facilitate your recovery.   Medications are also useful to help with pain control.   Acetaminophen.  This medication is generally safe, and found over the counter. Take as directed for your age. You should not take more than 8 of the extra strength (500mg ) pills a day (max dose is 4000mg  total OVER one day)  Lidocaine Patches: Use over-the-counter salon pas lidocaine patches (blue and silver box)  You will need to follow up with your primary healthcare provider in 1-2 weeks for reassessment and persistent symptoms.  Be aware that if you develop new symptoms, such as a fever, leg weakness, difficulty with or loss of control of your urine or bowels, abdominal pain, or more severe pain, you will need to seek medical attention and/or  return to the Emergency department. Additional Information:  Your vital signs today were: BP (!) 162/79 (BP Location: Left Arm)    Pulse 83    Temp 98 F (36.7 C) (Oral)    Resp 14    Ht 5\' 5"  (1.651 m)    Wt 73.5 kg    SpO2 98%    BMI 26.96 kg/m  If your blood pressure (BP) was elevated above 135/85 this visit, please have this repeated by your doctor within one month. ---------------

## 2018-10-03 NOTE — ED Notes (Signed)
No answer when called for room x 2 

## 2018-10-03 NOTE — ED Notes (Signed)
No answer when called for room x 3 

## 2018-10-03 NOTE — ED Triage Notes (Signed)
Pt stated that she has been aspirin for her recurrent back pain on r/side

## 2018-10-03 NOTE — ED Provider Notes (Signed)
[facility] Provider Note   CSN: [id number] Arrival date & time: 10/03/18  1749     History   Chief Complaint Chief Complaint  Patient presents with  . Back Pain    HPI Felicia Collier is a 62 y.o. female.  Felicia Collier is a 62 y.o. Female with a history of schizophrenia, chronic back and knee pain and diverticulitis, who presents to the emergency department for issues with recurrent pain in her right upper back.  She reports this pain feels like a tightness in her muscles that runs up and down her back and has been present ever since the surgery in her left wrist at the end of September.  She denies any chest pain or shortness of breath.  Pain is worse with movement and palpation, it is not pleuritic in nature.  She denies any numbness or tingling in her extremities, no extremity weakness.  She denies any loss of bowel or bladder control or saddle anesthesia.  No fevers, cough, abdominal pain, nausea or vomiting.  No dysuria or urinary frequency.  She is tried aspirin for this pain relief and has not tried anything no other aggravating or relieving factors.     Past Medical History:  Diagnosis Date  . Chronic back pain   . Chronic knee pain   . Diverticulitis   . Schizophrenia [facility](HCC)     Patient Active Problem List   Diagnosis Date Noted  . Diarrhea 07/25/2018  . Pain in left wrist 07/20/2017  . Schizophrenia (HCC) 05/01/2017  . Chronic pain of left knee 02/09/2017  . Unilateral primary osteoarthritis, left knee 02/09/2017  . Neuropathy 11/22/2016  . Gastroesophageal reflux disease without esophagitis 11/22/2016  . Weight gain 11/22/2016  . Atypical chest pain 11/04/2016    Past Surgical History:  Procedure Laterality Date  . ABDOMINAL HYSTERECTOMY    . CHOLECYSTECTOMY    . WRIST SURGERY Left      OB History   None      Home Medications    Prior to Admission medications   Medication Sig Start Date End Date Taking?  Authorizing Provider  ciprofloxacin (CIPRO) 500 MG tablet Take 1 tablet (500 mg total) by mouth 2 (two) times daily. 08/22/18   Dione BoozeGlick, David, MD  gabapentin (NEURONTIN) 300 MG capsule Take 1 capsule (300 mg total) by mouth 2 (two) times daily. 09/22/18 10/22/18  Mortis, Jerrel IvoryGabrielle I, PA-C  methocarbamol (ROBAXIN) 500 MG tablet Take 500 mg by mouth every 8 (eight) hours as needed for muscle spasms.     [provider]  metroNIDAZOLE (FLAGYL) 500 MG tablet Take 1 tablet (500 mg total) by mouth 3 (three) times daily. 08/22/18   Dione BoozeGlick, David, MD  ondansetron (ZOFRAN) 4 MG tablet Take 1 tablet (4 mg total) by mouth every 8 (eight) hours as needed for nausea or vomiting. 08/25/18   Kallie LocksStroud, Natalie M, FNP  oxyCODONE-acetaminophen (PERCOCET/ROXICET) 5-325 MG tablet Take 1 tablet by mouth every 6 (six) hours as needed for moderate pain.     [provider]  potassium chloride SA (K-DUR,KLOR-CON) 20 MEQ tablet Take 1 tablet (20 mEq total) by mouth 2 (two) times daily. 08/22/18   Dione BoozeGlick, David, MD  vitamin C (ASCORBIC ACID) 500 MG tablet Take 500 mg by mouth daily.     [provider]    Family History Family History  Problem Relation Age of Onset  . Other Mother        no health conditions per  patient  . Other Father        no health conditions per patient    Social History Social History   Tobacco Use  . Smoking status: Never Smoker  . Smokeless tobacco: Never Used  Substance Use Topics  . Alcohol use: No  . Drug use: No     Allergies   Lidocaine   Review of Systems Review of Systems  Constitutional: Negative for chills and fever.  HENT: Negative.   Respiratory: Negative for shortness of breath.   Cardiovascular: Negative for chest pain.  Gastrointestinal: Negative for abdominal pain, constipation, diarrhea, nausea and vomiting.  Genitourinary: Negative for dysuria, flank pain, frequency and hematuria.  Musculoskeletal: Positive for back pain. Negative for  arthralgias, gait problem, joint swelling, myalgias and neck pain.  Skin: Negative for color change, rash and wound.  Neurological: Negative for weakness and numbness.     Physical Exam Updated Vital Signs BP (!) 162/79 (BP Location: Left Arm)   Pulse 83   Temp 98 F (36.7 C) (Oral)   Resp 14   Ht 5\' 5"  (1.651 m)   Wt 73.5 kg   SpO2 98%   BMI 26.96 kg/m   Physical Exam  Constitutional: She is oriented to person, place, and time. She appears well-developed and well-nourished. No distress.  HENT:  Head: Atraumatic.  Eyes: Right eye exhibits no discharge. Left eye exhibits no discharge.  Neck: Neck supple.  Cardiovascular: Normal rate, regular rhythm, normal heart sounds and intact distal pulses. Exam reveals no gallop and no friction rub.  No murmur heard. Pulses:      Radial pulses are 2+ on the right side, and 2+ on the left side.       Dorsalis pedis pulses are 2+ on the right side, and 2+ on the left side.       Posterior tibial pulses are 2+ on the right side, and 2+ on the left side.  Pulmonary/Chest: Effort normal and breath sounds normal. No respiratory distress.  Respirations equal and unlabored, patient able to speak in full sentences, lungs clear to auscultation bilaterally  Abdominal: Soft. Bowel sounds are normal. She exhibits no distension and no mass. There is no tenderness. There is no guarding.  Abdomen soft, nondistended, nontender to palpation in all quadrants without guarding or peritoneal signs, no CVA tenderness bilaterally  Musculoskeletal:  Tenderness to palpation over the right thoracic back musculature.  Pain is repeatedly reproducible with palpation, no palpable deformity or overlying skin changes.  No focal midline tenderness or lumbar tenderness.  Neurological: She is alert and oriented to person, place, and time.  Alert, clear speech, following commands. Moving all extremities without difficulty. Bilateral lower extremities with 5/5 strength in  proximal and distal muscle groups and with dorsi and plantar flexion. Sensation intact in bilateral lower extremities. 2+ patellar DTRs bilaterally. Ambulatory with steady gait  Skin: Skin is warm and dry. Capillary refill takes less than 2 seconds. She is not diaphoretic.  Psychiatric: She has a normal mood and affect. Her behavior is normal.  Nursing note and vitals reviewed.    ED Treatments / Results  Labs (all labs ordered are listed, but only abnormal results are displayed) Labs Reviewed - No data to display  EKG None  Radiology Dg Thoracic Spine 2 View  Result Date: 10/03/2018 CLINICAL DATA:  Dorsalgia EXAM: THORACIC SPINE 3 VIEWS COMPARISON:  Chest radiograph June 30, 2018 FINDINGS: Frontal, lateral, and swimmer's views were obtained. There is no fracture or spondylolisthesis. There  is mild disc space narrowing at several levels. There are multiple anterior and right-sided lower thoracic osteophytes. No erosive change or paraspinous lesion. Visualized lungs clear. IMPRESSION: No fracture or spondylolisthesis. Osteoarthritic change noted at several levels. Electronically Signed   By: Bretta Bang III M.D.   On: 10/03/2018 19:50    Procedures Procedures (including critical care time)  Medications Ordered in ED Medications  lidocaine (LIDODERM) 5 % 1 patch (1 patch Transdermal Patch Applied 10/03/18 1953)  acetaminophen (TYLENOL) tablet 650 mg (650 mg Oral Given 10/03/18 1948)     Initial Impression / Assessment and Plan / ED Course  I have reviewed the triage vital signs and the nursing notes.  Pertinent labs & imaging results that were available during my care of the patient were reviewed by me and considered in my medical decision making (see chart for details).  Pt presents with 2 months of recurrent thoracic back pain, for which she has not sought evaluation, has tried aspirin without relief.  Pain is repeatedly reproducible over the right thoracic back with  palpation, no palpable deformity or overlying skin changes.  No associated chest pain or shortness of breath given that the pain has been present for 2 months without significant worsening or additional symptoms I have low suspicion for pulmonary or cardiac source.  X-ray shows osteoarthritic changes throughout the thoracic spine without any acute fracture or spondylolisthesis.  Pain improved with lidocaine patch and Tylenol here in the emergency department, normal neurologic exam.  Feel patient is stable for outpatient follow-up with her primary care doctor for further evaluation of pain persist.  Patient expresses understanding and is in agreement with plan.  Stable for discharge home.  Final Clinical Impressions(s) / ED Diagnoses   Final diagnoses:  Acute right-sided thoracic back pain    ED Discharge Orders    None       Legrand Rams 10/03/18 2139    Mancel Bale, MD 10/06/18 1527

## 2018-10-03 NOTE — ED Triage Notes (Signed)
Pt from home with c/o right sided back pain that began when she had wrist surgery in Sept. Pt denies change in this pain. Pt states they "dropped something in her back" during the procedure. Pt denies neuro symptoms and is ambulatory without alteration in gait.

## 2018-10-04 ENCOUNTER — Encounter: Payer: Self-pay | Admitting: Family Medicine

## 2018-10-04 ENCOUNTER — Ambulatory Visit (INDEPENDENT_AMBULATORY_CARE_PROVIDER_SITE_OTHER): Payer: Medicaid Other | Admitting: Family Medicine

## 2018-10-04 VITALS — BP 130/74 | HR 66 | Temp 98.0°F | Ht 65.0 in | Wt 163.4 lb

## 2018-10-04 DIAGNOSIS — M25562 Pain in left knee: Secondary | ICD-10-CM

## 2018-10-04 DIAGNOSIS — M545 Low back pain, unspecified: Secondary | ICD-10-CM

## 2018-10-04 DIAGNOSIS — M25532 Pain in left wrist: Secondary | ICD-10-CM

## 2018-10-04 DIAGNOSIS — Z09 Encounter for follow-up examination after completed treatment for conditions other than malignant neoplasm: Secondary | ICD-10-CM

## 2018-10-04 DIAGNOSIS — G629 Polyneuropathy, unspecified: Secondary | ICD-10-CM

## 2018-10-04 DIAGNOSIS — R8290 Unspecified abnormal findings in urine: Secondary | ICD-10-CM

## 2018-10-04 DIAGNOSIS — G8929 Other chronic pain: Secondary | ICD-10-CM

## 2018-10-04 DIAGNOSIS — G5602 Carpal tunnel syndrome, left upper limb: Secondary | ICD-10-CM

## 2018-10-04 LAB — POCT URINALYSIS DIP (MANUAL ENTRY)
Bilirubin, UA: NEGATIVE
Glucose, UA: NEGATIVE mg/dL
Ketones, POC UA: NEGATIVE mg/dL
Nitrite, UA: NEGATIVE
Protein Ur, POC: NEGATIVE mg/dL
Spec Grav, UA: 1.01 (ref 1.010–1.025)
Urobilinogen, UA: 0.2 E.U./dL
pH, UA: 6 (ref 5.0–8.0)

## 2018-10-04 MED ORDER — DICLOFENAC SODIUM 1 % TD GEL: 4.0000 g | Freq: Four times a day (QID) | TRANSDERMAL | 3 refills | Status: DC

## 2018-10-04 MED ORDER — METHOCARBAMOL 500 MG PO TABS: 500.0000 mg | ORAL_TABLET | Freq: Three times a day (TID) | ORAL | 3 refills | Status: DC | PRN

## 2018-10-04 MED ORDER — GABAPENTIN 300 MG PO CAPS: 300.0000 mg | ORAL_CAPSULE | Freq: Two times a day (BID) | ORAL | 3 refills | Status: DC

## 2018-10-04 NOTE — Progress Notes (Signed)
Hospital Follow Up  Subjective:    Patient ID: Felicia Collier, female    DOB: [birth year], 62 y.o.   MRN: [id number]   Chief Complaint  Patient presents with  . Back Pain  . Leg Pain    HPI  Ms. Felicia Collier is a 62 year old female with a past medical history of Schizophrenia, Diverticulitis, Chronic Knee Pain, and Chronic Back Pain. She is here today for hospital follow up.   Current Status: Since her last office visit, she is doing well with a recent ED visit on 10/02/2018 for back pain. She continues to have increased back pain X 1 week. She rates her pain 9/10 today.   She denies fevers, chills, fatigue, recent infections, weight loss, and night sweats. She has not had any headaches, visual changes, dizziness, and falls. No chest pain, heart palpitations, cough and shortness of breath reported. No reports of GI problems such as nausea, vomiting, diarrhea, and constipation. She has no reports of blood in stools, dysuria and hematuria. No depression or anxiety reported.  Review of Systems  Constitutional: Negative.   HENT: Negative.   Eyes: Negative.   Respiratory: Negative.   Cardiovascular: Negative.   Gastrointestinal: Negative.   Endocrine: Negative.   Genitourinary: Negative.   Musculoskeletal: Positive for arthralgias (Generalized) and back pain.  Allergic/Immunologic: Negative.   Neurological: Negative.   Hematological: Negative.   Psychiatric/Behavioral: The patient is nervous/anxious.    Objective:   Physical Exam  Constitutional: She is oriented to person, place, and time. She appears well-developed and well-nourished.  HENT:  Head: Normocephalic and atraumatic.  Eyes: Pupils are equal, round, and reactive to light. Conjunctivae and EOM are normal.  Neck: Normal range of motion. Neck supple.  Cardiovascular: Normal rate, regular rhythm, normal heart sounds and intact distal pulses.  Pulmonary/Chest: Effort normal and breath sounds normal.  Abdominal: Soft. Bowel sounds  are normal.  Musculoskeletal: Normal range of motion.  Neurological: She is alert and oriented to person, place, and time.  Skin: Skin is warm and dry.  Psychiatric: She has a normal mood and affect. Her behavior is normal. Judgment and thought content normal.  Nursing note and vitals reviewed.  Assessment & Plan:   1. Low back pain, unspecified back pain laterality, unspecified chronicity, unspecified whether sciatica present He back pain has improved. She will continue pain medications as prescribed. - POCT urinalysis dipstick - methocarbamol (ROBAXIN) 500 MG tablet; Take 1 tablet (500 mg total) by mouth every 8 (eight) hours as needed for muscle spasms.  Dispense: 30 tablet; Refill: 3 - diclofenac sodium (VOLTAREN) 1 % GEL; Apply 4 g topically 4 (four) times daily.  Dispense: 1 Tube; Refill: 3  2. Left wrist pai  3. Neuropathy - gabapentin (NEURONTIN) 300 MG capsule; Take 1 capsule (300 mg total) by mouth 2 (two) times daily.  Dispense: 60 capsule; Refill: 3  4. Carpal tunnel syndrome of left wrist Stable. Recent surgery to left wrist. She continues to wear wrist brace.   5. Chronic pain of left knee We will initiate Diclofenac gel today.  - diclofenac sodium (VOLTAREN) 1 % GEL; Apply 4 g topically 4 (four) times daily.  Dispense: 1 Tube; Refill: 3  6. Abnormal urinalysis - Urine Culture  7. Follow up She will keep previously scheduled appointment.   Meds ordered this encounter  Medications  . gabapentin (NEURONTIN) 300 MG capsule    Sig: Take 1 capsule (300 mg total) by mouth 2 (two) times daily.    Dispense:  60 capsule    Refill:  3  . methocarbamol (ROBAXIN) 500 MG tablet    Sig: Take 1 tablet (500 mg total) by mouth every 8 (eight) hours as needed for muscle spasms.    Dispense:  30 tablet    Refill:  3  . diclofenac sodium (VOLTAREN) 1 % GEL    Sig: Apply 4 g topically 4 (four) times daily.    Dispense:  1 Tube    Refill:  3   Raliegh IpNatalie Jewels Langone,  MSN,  FNP-C [facility] [address]  [place], [place] [id number] [phone]

## 2018-10-06 LAB — URINE CULTURE

## 2018-10-11 ENCOUNTER — Other Ambulatory Visit: Payer: Self-pay

## 2018-10-11 ENCOUNTER — Emergency Department (HOSPITAL_COMMUNITY): Payer: Medicaid Other

## 2018-10-11 ENCOUNTER — Encounter (HOSPITAL_COMMUNITY): Payer: Self-pay | Admitting: Emergency Medicine

## 2018-10-11 ENCOUNTER — Emergency Department (HOSPITAL_COMMUNITY)
Admission: EM | Admit: 2018-10-11 | Discharge: 2018-10-11 | Discharge status: Against Medical Advice | Payer: Medicaid Other | Attending: Emergency Medicine | Admitting: Emergency Medicine

## 2018-10-11 DIAGNOSIS — G8929 Other chronic pain: Secondary | ICD-10-CM | POA: Insufficient documentation

## 2018-10-11 DIAGNOSIS — Z79899 Other long term (current) drug therapy: Secondary | ICD-10-CM | POA: Diagnosis not present

## 2018-10-11 DIAGNOSIS — Z5320 Procedure and treatment not carried out because of patient's decision for unspecified reasons: Secondary | ICD-10-CM | POA: Diagnosis not present

## 2018-10-11 DIAGNOSIS — F209 Schizophrenia, unspecified: Secondary | ICD-10-CM | POA: Insufficient documentation

## 2018-10-11 DIAGNOSIS — M546 Pain in thoracic spine: Secondary | ICD-10-CM | POA: Insufficient documentation

## 2018-10-11 MED ORDER — LIDOCAINE 5 % EX PTCH
1.0000 | MEDICATED_PATCH | CUTANEOUS | Status: DC
  Administered 2018-10-11: 1 via TRANSDERMAL
  Filled 2018-10-11: qty 1

## 2018-10-11 MED ORDER — ACETAMINOPHEN 500 MG PO TABS
1000.0000 mg | ORAL_TABLET | Freq: Once | ORAL | Status: AC
  Administered 2018-10-11: 1000 mg via ORAL
  Filled 2018-10-11: qty 2

## 2018-10-11 NOTE — ED Notes (Addendum)
Patient states she does not want to wait any longer and she is leaving. EDPA notified.

## 2018-10-11 NOTE — ED Triage Notes (Signed)
Patient states right sided flank and back pain x2 weeks. Patient seen multiple times for same. Patient states she saw her doctor and they gave her medicine but it isn't better.

## 2018-10-11 NOTE — ED Provider Notes (Signed)
[facility]-EMERGENCY DEPT Provider Note   CSN: [id number] Arrival date & time: 10/11/18  1933     History   Chief Complaint Chief Complaint  Patient presents with  . Back Pain    HPI Felicia Collier is a 62 y.o. female with PMH/o schizophrenia, chronic back pain, chronic knee pain who presents for evaluation of right-sided back pain.  She reports is been ongoing for the last month.  She comes in today because she states the medication she has been taking it up working.  She was seen in the ED on 10/02/2018 for evaluation of same symptoms.  Additionally, she saw her primary care doctor on 10/04/2017 who prescribed her Voltaren gel, Robaxin.  Patient reports that she never got the Voltaren gel as the pharmacist told her to be canceled.  She states that she took the Robaxin but states that it made her reaction to it so she stopped taking it.  She denies any new preceding trauma, injury, fall.  She states she is continued to have pain that radiates around to her right lateral side.  She states that pain is worse with movement.  She denies any pain with deep inspiration or difficulty breathing.  Reports he had one episode of vomiting 2 days ago but none since.  Patient denies any fever, numbness/weakness of arms or legs, saddle anesthesia, urinary bowel incontinence, abdominal pain, nausea, dysuria, hematuria.   The history is provided by the patient.    Past Medical History:  Diagnosis Date  . Chronic back pain   . Chronic knee pain   . Diverticulitis   . Schizophrenia [facility])     Patient Active Problem List   Diagnosis Date Noted  . Diarrhea 07/25/2018  . Pain in left wrist 07/20/2017  . Schizophrenia (HCC) 05/01/2017  . Chronic pain of left knee 02/09/2017  . Unilateral primary osteoarthritis, left knee 02/09/2017  . Neuropathy 11/22/2016  . Gastroesophageal reflux disease without esophagitis 11/22/2016  . Weight gain 11/22/2016  . Atypical chest pain  11/04/2016    Past Surgical History:  Procedure Laterality Date  . ABDOMINAL HYSTERECTOMY    . CHOLECYSTECTOMY    . WRIST SURGERY Left      OB History   None      Home Medications    Prior to Admission medications   Medication Sig Start Date End Date Taking? Authorizing Provider  diclofenac sodium (VOLTAREN) 1 % GEL Apply 4 g topically 4 (four) times daily. 10/04/18   Kallie Locks, FNP  gabapentin (NEURONTIN) 300 MG capsule Take 1 capsule (300 mg total) by mouth 2 (two) times daily. 10/04/18 02/01/19  Kallie Locks, FNP  methocarbamol (ROBAXIN) 500 MG tablet Take 1 tablet (500 mg total) by mouth every 8 (eight) hours as needed for muscle spasms. 10/04/18   Kallie Locks, FNP  metroNIDAZOLE (FLAGYL) 500 MG tablet Take 1 tablet (500 mg total) by mouth 3 (three) times daily. Patient not taking: Reported on 10/04/2018 08/22/18   Dione Booze, MD  ondansetron (ZOFRAN) 4 MG tablet Take 1 tablet (4 mg total) by mouth every 8 (eight) hours as needed for nausea or vomiting. Patient not taking: Reported on 10/04/2018 08/25/18   Kallie Locks, FNP  vitamin C (ASCORBIC ACID) 500 MG tablet Take 500 mg by mouth daily.     [provider]    Family History Family History  Problem Relation Age of Onset  . Other Mother  no health conditions per patient  . Other Father        no health conditions per patient    Social History Social History   Tobacco Use  . Smoking status: Never Smoker  . Smokeless tobacco: Never Used  Substance Use Topics  . Alcohol use: No  . Drug use: No     Allergies   Lidocaine   Review of Systems Review of Systems  Constitutional: Negative for fever.  Respiratory: Negative for cough and shortness of breath.   Cardiovascular: Negative for chest pain.  Gastrointestinal: Negative for abdominal pain, nausea and vomiting.  Genitourinary: Negative for dysuria and hematuria.  Musculoskeletal: Positive for back pain. Negative  for neck pain.  Neurological: Negative for weakness, numbness and headaches.  All other systems reviewed and are negative.    Physical Exam Updated Vital Signs BP 139/89 (BP Location: Left Arm)   Pulse 63   Temp 98.7 F (37.1 C) (Oral)   Resp 16   SpO2 96%   Physical Exam  Constitutional: She is oriented to person, place, and time. She appears well-developed and well-nourished.  HENT:  Head: Normocephalic and atraumatic.  Mouth/Throat: Oropharynx is clear and moist and mucous membranes are normal.  Eyes: Pupils are equal, round, and reactive to light. Conjunctivae, EOM and lids are normal.  Neck: Full passive range of motion without pain.  Cardiovascular: Normal rate, regular rhythm, normal heart sounds and normal pulses. Exam reveals no gallop and no friction rub.  No murmur heard. Pulmonary/Chest: Effort normal and breath sounds normal.  Lungs clear to auscultation bilaterally.  Symmetric chest rise.  No wheezing, rales, rhonchi.  Abdominal: Soft. Normal appearance. There is no tenderness. There is no rigidity and no guarding.  Musculoskeletal: Normal range of motion.       Thoracic back: She exhibits tenderness.       Lumbar back: She exhibits tenderness.       Back:  Neurological: She is alert and oriented to person, place, and time.  Follows commands, Moves all extremities  5/5 strength to BUE and BLE  Sensation intact throughout all major nerve distributions Normal gait  Skin: Skin is warm and dry. Capillary refill takes less than 2 seconds.  Psychiatric: She has a normal mood and affect. Her speech is normal.  Nursing note and vitals reviewed.    ED Treatments / Results  Labs (all labs ordered are listed, but only abnormal results are displayed) Labs Reviewed - No data to display  EKG None  Radiology No results found.  Procedures Procedures (including critical care time)  Medications Ordered in ED Medications  lidocaine (LIDODERM) 5 % 1 patch (1 patch  Transdermal Patch Applied 10/11/18 2057)  acetaminophen (TYLENOL) tablet 1,000 mg (1,000 mg Oral Given 10/11/18 2057)     Initial Impression / Assessment and Plan / ED Course  I have reviewed the triage vital signs and the nursing notes.  Pertinent labs & imaging results that were available during my care of the patient were reviewed by me and considered in my medical decision making (see chart for details).     62 year old female who presents for right-sided back pain.  This is been ongoing for a month and she has had multiple ED visits for the same.  Additionally, she saw her primary care doctor on 10/04/2017.  She was prescribed Robaxin, Voltaren gel.  She reports she never picked up the Voltaren gel.  She states the Robaxin made her sick and she stopped taking it.  She reports that the pain radiates to the right lateral side.  No pleuritic pain.  No difficulty breathing.  No numbness/weakness, saddle anesthesia.  Consider mechanical back pain.  Low suspicion for infectious etiology.  History/physical exam is not concerning for spinal abscess, cauda equina.  Additionally, do not suspect ACS etiology as the source of patient's pain.  Reviewed patient's previous records and she has been here in the ED previously.  Most recently was on 10/03/2018.  She had an x-ray of her thoracic spine at the time which was unremarkable.  No recent chest x-rays that I can see.  We will plan for eval chest x-ray to rule out any bony ab normality of the ribs that would be causing her pain.  Patient given lidocaine patch and Tylenol for pain relief.  RN informed me that patient eloped.  Left prior to completion of treatment.  Final Clinical Impressions(s) / ED Diagnoses   Final diagnoses:  Acute right-sided thoracic back pain    ED Discharge Orders    None       Rosana HoesLayden, Karel Turpen A, PA-C 10/11/18 2229    Pricilla LovelessGoldston, Scott, MD 10/11/18 2349

## 2018-10-18 ENCOUNTER — Other Ambulatory Visit: Payer: Self-pay | Admitting: Family Medicine

## 2018-10-18 DIAGNOSIS — A491 Streptococcal infection, unspecified site: Secondary | ICD-10-CM

## 2018-10-18 MED ORDER — AMOXICILLIN-POT CLAVULANATE 875-125 MG PO TABS: 1.0000 | ORAL_TABLET | Freq: Two times a day (BID) | ORAL | 0 refills | Status: AC

## 2018-10-18 NOTE — Progress Notes (Signed)
New Rx for Augmentin sent to pharmacy today.  

## 2018-10-19 ENCOUNTER — Emergency Department (HOSPITAL_COMMUNITY)
Admission: EM | Admit: 2018-10-19 | Discharge: 2018-10-19 | Disposition: A | Discharge status: Home / Self Care | Payer: Medicaid Other | Attending: Emergency Medicine | Admitting: Emergency Medicine

## 2018-10-19 ENCOUNTER — Other Ambulatory Visit: Payer: Self-pay

## 2018-10-19 ENCOUNTER — Encounter: Payer: Medicaid Other | Admitting: Family Medicine

## 2018-10-19 ENCOUNTER — Encounter (HOSPITAL_COMMUNITY): Payer: Self-pay

## 2018-10-19 ENCOUNTER — Telehealth: Payer: Self-pay

## 2018-10-19 DIAGNOSIS — G8929 Other chronic pain: Secondary | ICD-10-CM | POA: Diagnosis not present

## 2018-10-19 DIAGNOSIS — Z79899 Other long term (current) drug therapy: Secondary | ICD-10-CM | POA: Diagnosis not present

## 2018-10-19 DIAGNOSIS — M549 Dorsalgia, unspecified: Secondary | ICD-10-CM | POA: Diagnosis present

## 2018-10-19 DIAGNOSIS — M545 Low back pain: Secondary | ICD-10-CM | POA: Insufficient documentation

## 2018-10-19 MED ORDER — CYCLOBENZAPRINE HCL 10 MG PO TABS: 10.0000 mg | ORAL_TABLET | Freq: Two times a day (BID) | ORAL | 0 refills | Status: DC | PRN

## 2018-10-19 NOTE — Telephone Encounter (Signed)
-----   Message from Kallie LocksNatalie M Stroud, FNP sent at 10/18/2018  9:41 PM EST ----- Regarding: "Urine Culture Results" Lyla SonCarrie,   Urine culture identified bacteria. Please inform patient that we have sent new Rx for Augmentin to pharmacy today. This antibiotic is specific to eliminate this bacteria. She is to take medication as directed. She is to complete all medication.   Thank you.

## 2018-10-19 NOTE — Telephone Encounter (Signed)
Patient notified

## 2018-10-19 NOTE — ED Triage Notes (Signed)
Pt states that she has been having lower and upper back pain for 2-3 months. Pt states she was seen for the same a few weeks ago, but was unable to wait to see dr. Jillyn HiddenXrays were negative for fx at that time.

## 2018-10-19 NOTE — Progress Notes (Signed)
Error. Appt was cancelled.

## 2018-10-19 NOTE — ED Provider Notes (Signed)
[facility]-EMERGENCY DEPT Provider Note   CSN: [id number] Arrival date & time: 10/19/18  0740     History   Chief Complaint Chief Complaint  Patient presents with  . Back Pain    HPI Felicia Collier is a 62 y.o. female.  62 year old female with prior medical history as detailed below presents for evaluation of back pain.  Patient's symptoms have been ongoing for several months.  She has seen multiple care providers for her same complaint.  She describes diffuse migratory back pain.  Pain is occasionally in the right shoulder and then moved to the right low back.  The pain also can apparently be on the left mid back area as well.  She has taken multiple medications for her symptoms without significant control.  She reports that she has not seen her regular care provider about the symptoms in quite some time.  She denies associated chest pain, shortness of breath, nausea, vomiting, fever, or other acute medical complaint.  She has received multiple prior workups without evidence of significant acute pathology.   The history is provided by medical records and the patient.  Back Pain   This is a chronic problem. The current episode started more than 1 week ago. The problem occurs daily. The problem has not changed since onset.The pain is associated with no known injury. The pain is present in the lumbar spine and thoracic spine. The quality of the pain is described as aching. The pain does not radiate. The pain is mild. Pertinent negatives include no chest pain, no fever, no abdominal pain, no abdominal swelling, no leg pain, no paresthesias, no paresis, no tingling and no weakness.    Past Medical History:  Diagnosis Date  . Chronic back pain   . Chronic knee pain   . Diverticulitis   . Schizophrenia [facility])     Patient Active Problem List   Diagnosis Date Noted  . Diarrhea 07/25/2018  . Pain in left wrist 07/20/2017  . Schizophrenia (HCC) 05/01/2017  . Chronic  pain of left knee 02/09/2017  . Unilateral primary osteoarthritis, left knee 02/09/2017  . Neuropathy 11/22/2016  . Gastroesophageal reflux disease without esophagitis 11/22/2016  . Weight gain 11/22/2016  . Atypical chest pain 11/04/2016    Past Surgical History:  Procedure Laterality Date  . ABDOMINAL HYSTERECTOMY    . CHOLECYSTECTOMY    . WRIST SURGERY Left      OB History   None      Home Medications    Prior to Admission medications   Medication Sig Start Date End Date Taking? Authorizing Provider  amoxicillin-clavulanate (AUGMENTIN) 875-125 MG tablet Take 1 tablet by mouth 2 (two) times daily for 7 days. 10/18/18 10/25/18  Kallie Locks, FNP  cyclobenzaprine (FLEXERIL) 10 MG tablet Take 1 tablet (10 mg total) by mouth 2 (two) times daily as needed for muscle spasms. 10/19/18   Wynetta Fines, MD  diclofenac sodium (VOLTAREN) 1 % GEL Apply 4 g topically 4 (four) times daily. 10/04/18   Kallie Locks, FNP  gabapentin (NEURONTIN) 300 MG capsule Take 1 capsule (300 mg total) by mouth 2 (two) times daily. 10/04/18 02/01/19  Kallie Locks, FNP  methocarbamol (ROBAXIN) 500 MG tablet Take 1 tablet (500 mg total) by mouth every 8 (eight) hours as needed for muscle spasms. 10/04/18   Kallie Locks, FNP  metroNIDAZOLE (FLAGYL) 500 MG tablet Take 1 tablet (500 mg total) by mouth 3 (three) times daily. Patient not taking:  Reported on 10/04/2018 08/22/18   Dione BoozeGlick, David, MD  ondansetron (ZOFRAN) 4 MG tablet Take 1 tablet (4 mg total) by mouth every 8 (eight) hours as needed for nausea or vomiting. Patient not taking: Reported on 10/04/2018 08/25/18   Kallie LocksStroud, Natalie M, FNP  vitamin C (ASCORBIC ACID) 500 MG tablet Take 500 mg by mouth daily.     [provider]    Family History Family History  Problem Relation Age of Onset  . Other Mother        no health conditions per patient  . Other Father        no health conditions per patient    Social  History Social History   Tobacco Use  . Smoking status: Never Smoker  . Smokeless tobacco: Never Used  Substance Use Topics  . Alcohol use: No  . Drug use: No     Allergies   Lidocaine   Review of Systems Review of Systems  Constitutional: Negative for fever.  Cardiovascular: Negative for chest pain.  Gastrointestinal: Negative for abdominal pain.  Musculoskeletal: Positive for back pain.  Neurological: Negative for tingling, weakness and paresthesias.  All other systems reviewed and are negative.    Physical Exam Updated Vital Signs BP (!) 153/107   Pulse 64   Temp 98 F (36.7 C) (Oral)   Resp 16   Ht 5\' 5"  (1.651 m)   Wt 73.9 kg   SpO2 96%   BMI 27.11 kg/m   Physical Exam  Constitutional: She is oriented to person, place, and time. She appears well-developed and well-nourished. No distress.  HENT:  Head: Normocephalic and atraumatic.  Mouth/Throat: Oropharynx is clear and moist.  Eyes: Pupils are equal, round, and reactive to light. Conjunctivae and EOM are normal.  Neck: Normal range of motion. Neck supple.  Cardiovascular: Normal rate, regular rhythm and normal heart sounds.  Pulmonary/Chest: Effort normal and breath sounds normal. No respiratory distress.  Abdominal: Soft. She exhibits no distension. There is no tenderness.  Musculoskeletal: Normal range of motion. She exhibits no edema or deformity.  Neurological: She is alert and oriented to person, place, and time.  Skin: Skin is warm and dry.  Psychiatric: She has a normal mood and affect.  Nursing note and vitals reviewed.    ED Treatments / Results  Labs (all labs ordered are listed, but only abnormal results are displayed) Labs Reviewed - No data to display  EKG None  Radiology No results found.  Procedures Procedures (including critical care time)  Medications Ordered in ED Medications - No data to display   Initial Impression / Assessment and Plan / ED Course  I have reviewed  the triage vital signs and the nursing notes.  Pertinent labs & imaging results that were available during my care of the patient were reviewed by me and considered in my medical decision making (see chart for details).     MDM   Screen complete  Patient is presenting for evaluation of chronic back pain.  Patient's symptoms have been ongoing for several months at least.  Patient is without evidence of acute medical pathology based on her history or exam.  She has had recent prior work-ups without significant evidence of significant pathology.  After extensive discussion with the patient about options for treatment she has opted for a trial of Flexeril.  She reports that multiple other trials of medications for her symptoms have not been successful.    She understands the need for close follow-up with  her regular care provider whose "office is very close to the hospital."  She reports that she will go there later this morning and "get checked out."  Strict return precautions are given and understood.  Final Clinical Impressions(s) / ED Diagnoses   Final diagnoses:  Chronic back pain, unspecified back location, unspecified back pain laterality    ED Discharge Orders         Ordered    cyclobenzaprine (FLEXERIL) 10 MG tablet  2 times daily PRN     10/19/18 0833           Wynetta Fines, MD 10/19/18 [phone]

## 2018-10-19 NOTE — Discharge Instructions (Signed)
Please return for any problem.  Follow-up with your regular care provider later today as instructed.

## 2018-10-19 NOTE — ED Notes (Signed)
Discharge instructions reviewed with patient. Patient verbalizes understanding. VSS.   

## 2018-10-27 ENCOUNTER — Ambulatory Visit (INDEPENDENT_AMBULATORY_CARE_PROVIDER_SITE_OTHER): Payer: Medicaid Other | Admitting: Family Medicine

## 2018-10-27 VITALS — BP 126/68 | HR 74 | Temp 98.0°F | Ht 65.0 in | Wt 162.0 lb

## 2018-10-27 DIAGNOSIS — M25562 Pain in left knee: Secondary | ICD-10-CM | POA: Diagnosis not present

## 2018-10-27 DIAGNOSIS — M545 Low back pain, unspecified: Secondary | ICD-10-CM

## 2018-10-27 DIAGNOSIS — Z09 Encounter for follow-up examination after completed treatment for conditions other than malignant neoplasm: Secondary | ICD-10-CM | POA: Diagnosis not present

## 2018-10-27 DIAGNOSIS — G629 Polyneuropathy, unspecified: Secondary | ICD-10-CM | POA: Diagnosis not present

## 2018-10-27 DIAGNOSIS — G8929 Other chronic pain: Secondary | ICD-10-CM

## 2018-10-27 MED ORDER — VITAMIN C 500 MG PO TABS: 500.0000 mg | ORAL_TABLET | Freq: Every day | ORAL | 11 refills | Status: DC

## 2018-10-27 MED ORDER — METHOCARBAMOL 500 MG PO TABS: 500.0000 mg | ORAL_TABLET | Freq: Three times a day (TID) | ORAL | 3 refills | Status: DC | PRN

## 2018-10-27 MED ORDER — DICLOFENAC SODIUM 1 % TD GEL: 4.0000 g | Freq: Four times a day (QID) | TRANSDERMAL | 3 refills | Status: DC

## 2018-10-27 MED ORDER — GABAPENTIN 300 MG PO CAPS: 300.0000 mg | ORAL_CAPSULE | Freq: Two times a day (BID) | ORAL | 3 refills | Status: DC

## 2018-10-27 NOTE — Progress Notes (Addendum)
Hospital Follow Up  Subjective:    Patient ID: Felicia Collier, female    DOB: [birth year], 62 y.o.   MRN: [id number]  Chief Complaint  Patient presents with  . Hospitalization Follow-up   HPI  Felicia Collier is a 62 year old female with a past medical history of Schizophrenia, Diverticulitis, Chronic Knee Pain, and Chronic Back Pain. She is here today for Hospital Follow Up.     Current Status: Since her last office visit, she has had an ED visit on 10/19/2018 for Chronic Back Pain. She was evaluated and discharged home with new Rx for Flexeril. She states that new medication working, but she continues to have mild discomfort. She has not been taking any pain medication for relief. Her anxiety is mild today. She denies suicidal ideations, homicidal ideations, or auditory hallucinations.   She denies fevers, chills, fatigue, recent infections, weight loss, and night sweats. She has not had any headaches, visual changes, dizziness, and falls. No chest pain, heart palpitations, cough and shortness of breath reported. No reports of GI problems such as nausea, vomiting, diarrhea, and constipation. She has no reports of blood in stools, dysuria and hematuria.   Review of Systems  Constitutional: Negative.   HENT: Negative.   Eyes: Negative.   Respiratory: Negative.   Cardiovascular: Negative.   Gastrointestinal: Negative.   Endocrine: Negative.   Genitourinary: Negative.   Musculoskeletal: Positive for arthralgias (chronic knee pain) and back pain (chronic).  Skin: Negative.   Allergic/Immunologic: Negative.   Neurological: Positive for numbness (hands).  Hematological: Negative.   Psychiatric/Behavioral: Negative.    Objective:   Physical Exam Vitals signs and nursing note reviewed.  Constitutional:      Appearance: Normal appearance. She is normal weight.  HENT:     Head: Normocephalic and atraumatic.     Right Ear: Tympanic membrane, ear canal and external ear normal.     Left Ear:  Tympanic membrane, ear canal and external ear normal.     Nose: Nose normal.     Mouth/Throat:     Mouth: Mucous membranes are dry.     Pharynx: Oropharynx is clear.  Eyes:     Extraocular Movements: Extraocular movements intact.     Conjunctiva/sclera: Conjunctivae normal.     Pupils: Pupils are equal, round, and reactive to light.  Neck:     Musculoskeletal: Normal range of motion and neck supple.  Cardiovascular:     Rate and Rhythm: Normal rate and regular rhythm.     Pulses: Normal pulses.     Heart sounds: Normal heart sounds.  Pulmonary:     Effort: Pulmonary effort is normal.     Breath sounds: Normal breath sounds.  Abdominal:     General: Bowel sounds are normal. There is distension.     Palpations: Abdomen is soft.  Neurological:     Mental Status: She is alert.    Assessment & Plan:   1. Hospital discharge follow-up She is encouraged to follow up with our office if needed to decrease ED visits.   2. Low back pain, unspecified back pain laterality, unspecified chronicity, unspecified whether sciatica present She will continue newly prescribed Rx for Flexeril. We will give her refills on Robaxin and Diclofenac today.  - diclofenac sodium (VOLTAREN) 1 % GEL; Apply 4 g topically 4 (four) times daily. (Patient not taking: Reported on 10/27/2018)  Dispense: 1 Tube; Refill: 3 - methocarbamol (ROBAXIN) 500 MG tablet; Take 1 tablet (500 mg total) by mouth every 8 (  eight) hours as needed for muscle spasms. (Patient not taking: Reported on 10/27/2018)  Dispense: 30 tablet; Refill: 3  3. Chronic pain of left knee - diclofenac sodium (VOLTAREN) 1 % GEL; Apply 4 g topically 4 (four) times daily. (Patient not taking: Reported on 10/27/2018)  Dispense: 1 Tube; Refill: 3  4. Neuropathy She will resume taking Gabapentin today.  - gabapentin (NEURONTIN) 300 MG capsule; Take 1 capsule (300 mg total) by mouth 2 (two) times daily. (Patient not taking: Reported on 10/27/2018)  Dispense:  60 capsule; Refill: 3  5. Follow up She will keep previously scheduled appointment on 12/2018.  Meds ordered this encounter  Medications  . diclofenac sodium (VOLTAREN) 1 % GEL    Sig: Apply 4 g topically 4 (four) times daily.    Dispense:  1 Tube    Refill:  3  . gabapentin (NEURONTIN) 300 MG capsule    Sig: Take 1 capsule (300 mg total) by mouth 2 (two) times daily.    Dispense:  60 capsule    Refill:  3  . methocarbamol (ROBAXIN) 500 MG tablet    Sig: Take 1 tablet (500 mg total) by mouth every 8 (eight) hours as needed for muscle spasms.    Dispense:  30 tablet    Refill:  3  . vitamin C (ASCORBIC ACID) 500 MG tablet    Sig: Take 1 tablet (500 mg total) by mouth daily.    Dispense:  30 tablet    Refill:  11   Raliegh IpNatalie Jaleal Schliep,  MSN, [facility] HospitalFNP-C Patient [facility] [address]  , [place] [id number] [phone]

## 2018-11-04 ENCOUNTER — Other Ambulatory Visit: Payer: Self-pay

## 2018-11-04 ENCOUNTER — Encounter (HOSPITAL_COMMUNITY): Payer: Self-pay | Admitting: Emergency Medicine

## 2018-11-04 ENCOUNTER — Emergency Department (HOSPITAL_COMMUNITY)
Admission: EM | Admit: 2018-11-04 | Discharge: 2018-11-04 | Discharge status: Against Medical Advice | Payer: Medicaid Other | Attending: Emergency Medicine | Admitting: Emergency Medicine

## 2018-11-04 ENCOUNTER — Emergency Department (HOSPITAL_COMMUNITY)
Admission: EM | Admit: 2018-11-04 | Discharge: 2018-11-04 | Disposition: A | Discharge status: Home / Self Care | Payer: Medicaid Other | Source: Home / Self Care | Attending: Emergency Medicine | Admitting: Emergency Medicine

## 2018-11-04 ENCOUNTER — Encounter (HOSPITAL_COMMUNITY): Payer: Self-pay | Admitting: *Deleted

## 2018-11-04 DIAGNOSIS — R51 Headache: Secondary | ICD-10-CM | POA: Diagnosis not present

## 2018-11-04 DIAGNOSIS — G8929 Other chronic pain: Secondary | ICD-10-CM

## 2018-11-04 DIAGNOSIS — Z79899 Other long term (current) drug therapy: Secondary | ICD-10-CM | POA: Insufficient documentation

## 2018-11-04 DIAGNOSIS — K2970 Gastritis, unspecified, without bleeding: Secondary | ICD-10-CM

## 2018-11-04 DIAGNOSIS — M25532 Pain in left wrist: Secondary | ICD-10-CM | POA: Insufficient documentation

## 2018-11-04 DIAGNOSIS — N390 Urinary tract infection, site not specified: Secondary | ICD-10-CM | POA: Insufficient documentation

## 2018-11-04 DIAGNOSIS — R109 Unspecified abdominal pain: Secondary | ICD-10-CM | POA: Diagnosis not present

## 2018-11-04 DIAGNOSIS — M546 Pain in thoracic spine: Secondary | ICD-10-CM | POA: Insufficient documentation

## 2018-11-04 DIAGNOSIS — R197 Diarrhea, unspecified: Secondary | ICD-10-CM | POA: Diagnosis not present

## 2018-11-04 LAB — URINALYSIS, ROUTINE W REFLEX MICROSCOPIC
Bilirubin Urine: NEGATIVE
Glucose, UA: NEGATIVE mg/dL
Hgb urine dipstick: NEGATIVE
Ketones, ur: NEGATIVE mg/dL
Nitrite: NEGATIVE
Protein, ur: NEGATIVE mg/dL
SPECIFIC GRAVITY, URINE: 1.012 (ref 1.005–1.030)
pH: 6 (ref 5.0–8.0)

## 2018-11-04 LAB — CBC
HCT: 43.9 % (ref 36.0–46.0)
Hemoglobin: 13 g/dL (ref 12.0–15.0)
MCH: 25.4 pg — AB (ref 26.0–34.0)
MCHC: 29.6 g/dL — ABNORMAL LOW (ref 30.0–36.0)
MCV: 85.9 fL (ref 80.0–100.0)
Platelets: 274 10*3/uL (ref 150–400)
RBC: 5.11 MIL/uL (ref 3.87–5.11)
RDW: 14.3 % (ref 11.5–15.5)
WBC: 9.9 10*3/uL (ref 4.0–10.5)
nRBC: 0 % (ref 0.0–0.2)

## 2018-11-04 LAB — COMPREHENSIVE METABOLIC PANEL
ALT: 17 U/L (ref 0–44)
AST: 18 U/L (ref 15–41)
Albumin: 4 g/dL (ref 3.5–5.0)
Alkaline Phosphatase: 89 U/L (ref 38–126)
Anion gap: 9 (ref 5–15)
BUN: 9 mg/dL (ref 8–23)
CALCIUM: 9.1 mg/dL (ref 8.9–10.3)
CO2: 26 mmol/L (ref 22–32)
Chloride: 101 mmol/L (ref 98–111)
Creatinine, Ser: 0.73 mg/dL (ref 0.44–1.00)
GFR calc Af Amer: >60 mL/min (ref >60)
GFR calc non Af Amer: >60 mL/min (ref >60)
Glucose, Bld: 92 mg/dL (ref 70–99)
Potassium: 3.7 mmol/L (ref 3.5–5.1)
Sodium: 136 mmol/L (ref 135–145)
Total Bilirubin: 1.2 mg/dL (ref 0.3–1.2)
Total Protein: 7.3 g/dL (ref 6.5–8.1)

## 2018-11-04 LAB — LIPASE, BLOOD: Lipase: 73 U/L — ABNORMAL HIGH (ref 11–51)

## 2018-11-04 MED ORDER — FAMOTIDINE 20 MG PO TABS: 20.0000 mg | ORAL_TABLET | Freq: Two times a day (BID) | ORAL | 0 refills | Status: DC

## 2018-11-04 MED ORDER — CEPHALEXIN 500 MG PO CAPS: 500.0000 mg | ORAL_CAPSULE | Freq: Two times a day (BID) | ORAL | 0 refills | Status: DC

## 2018-11-04 NOTE — ED Triage Notes (Addendum)
Pt c/o R mid / upper back pain. Pt states pain began immediately after she had L hand surgery several months ago. Pt also c/o headache and abdominal pain. Pt was triaged earlier today and had labs drawn then LWBS. Returned this evening.

## 2018-11-04 NOTE — Discharge Instructions (Signed)
Your lipase is mildly elevated.  This needs to be followed up by her primary care provider.  You have prescriptions at the pharmacy for your back pain.  I have given you a prescription for Pepcid which may help with your stomach pain after eating.  I also gave you a prescription for Keflex which is an antibiotic for your urinary tract infection.  Follow-up with your primary care physician.  Return here if you have worsening symptoms.

## 2018-11-04 NOTE — ED Notes (Signed)
Pt verbalized discharge instructions and follow up care. Ambulatory and alert

## 2018-11-04 NOTE — ED Provider Notes (Signed)
[facility]-EMERGENCY DEPT Provider Note   CSN: [id number] Arrival date & time: 11/04/18  1832     History   Chief Complaint Chief Complaint  Patient presents with  . Back Pain  . Headache    HPI Felicia Collier is a 62 y.o. female.  Patient is a 62 year old female with schizophrenia, chronic back pain and chronic knee pain.  She presents with back and knee pain.  She has been seen multiple times for similar symptoms in the past.  She said her back pain is in her right mid back and radiates up and down her back.  She has no radiation down her legs.  No numbness or weakness in her legs.  No loss of bowel or bladder control.  She denies any urinary symptoms.  She does complain of some epigastric discomfort after she eats but denies any now.  No vomiting.  No fevers.  She has been seen multiple times in the ED and her PCP over the last month for this back pain and knee pain.  She has been prescribed multiple different medications.  She actually was seen by her PCP on December 15 and prescribed medications which patient has not picked up yet.  She did take diclofenac but did not start the gabapentin.  She also reportedly had a UTI earlier this month.  I do see note on December 5 where Augmentin was called into the pharmacy for the patient but she states that she never picked it up.     Past Medical History:  Diagnosis Date  . Chronic back pain   . Chronic knee pain   . Diverticulitis   . Schizophrenia [facility](HCC)     Patient Active Problem List   Diagnosis Date Noted  . Diarrhea 07/25/2018  . Pain in left wrist 07/20/2017  . Schizophrenia (HCC) 05/01/2017  . Chronic pain of left knee 02/09/2017  . Unilateral primary osteoarthritis, left knee 02/09/2017  . Neuropathy 11/22/2016  . Gastroesophageal reflux disease without esophagitis 11/22/2016  . Weight gain 11/22/2016  . Atypical chest pain 11/04/2016    Past Surgical History:  Procedure Laterality Date  .  ABDOMINAL HYSTERECTOMY    . CHOLECYSTECTOMY    . WRIST SURGERY Left      OB History   No obstetric history on file.      Home Medications    Prior to Admission medications   Medication Sig Start Date End Date Taking? Authorizing Provider  cephALEXin (KEFLEX) 500 MG capsule Take 1 capsule (500 mg total) by mouth 2 (two) times daily. 11/04/18   Rolan BuccoBelfi, Quanna Wittke, MD  cyclobenzaprine (FLEXERIL) 10 MG tablet Take 1 tablet (10 mg total) by mouth 2 (two) times daily as needed for muscle spasms. 10/19/18   Wynetta FinesMessick, Peter C, MD  diclofenac sodium (VOLTAREN) 1 % GEL Apply 4 g topically 4 (four) times daily. Patient not taking: Reported on 10/27/2018 10/27/18   Kallie LocksStroud, Natalie M, FNP  famotidine (PEPCID) 20 MG tablet Take 1 tablet (20 mg total) by mouth 2 (two) times daily. 11/04/18   Rolan BuccoBelfi, Ji Fairburn, MD  gabapentin (NEURONTIN) 300 MG capsule Take 1 capsule (300 mg total) by mouth 2 (two) times daily. Patient not taking: Reported on 10/27/2018 10/27/18 02/24/19  Kallie LocksStroud, Natalie M, FNP  methocarbamol (ROBAXIN) 500 MG tablet Take 1 tablet (500 mg total) by mouth every 8 (eight) hours as needed for muscle spasms. Patient not taking: Reported on 10/27/2018 10/27/18   Kallie LocksStroud, Natalie M, FNP  ondansetron [facility](ZOFRAN) 4  MG tablet Take 1 tablet (4 mg total) by mouth every 8 (eight) hours as needed for nausea or vomiting. Patient not taking: Reported on 10/27/2018 08/25/18   Kallie LocksStroud, Natalie M, FNP  vitamin C (ASCORBIC ACID) 500 MG tablet Take 1 tablet (500 mg total) by mouth daily. Patient not taking: Reported on 10/27/2018 10/27/18   Kallie LocksStroud, Natalie M, FNP    Family History Family History  Problem Relation Age of Onset  . Other Mother        no health conditions per patient  . Other Father        no health conditions per patient    Social History Social History   Tobacco Use  . Smoking status: Never Smoker  . Smokeless tobacco: Never Used  Substance Use Topics  . Alcohol use: No  . Drug use: No      Allergies   Lidocaine   Review of Systems Review of Systems  Constitutional: Negative for chills, diaphoresis, fatigue and fever.  HENT: Negative for congestion, rhinorrhea and sneezing.   Eyes: Negative.   Respiratory: Negative for cough, chest tightness and shortness of breath.   Cardiovascular: Negative for chest pain and leg swelling.  Gastrointestinal: Positive for abdominal pain. Negative for blood in stool, diarrhea, nausea and vomiting.  Genitourinary: Negative for difficulty urinating, flank pain, frequency and hematuria.  Musculoskeletal: Positive for arthralgias and back pain.  Skin: Negative for rash.  Neurological: Negative for dizziness, speech difficulty, weakness, numbness and headaches.     Physical Exam Updated Vital Signs BP 137/80 (BP Location: Left Arm)   Pulse 85   Temp 98.1 F (36.7 C) (Oral)   Resp 16   SpO2 96%   Physical Exam Constitutional:      Appearance: She is well-developed.  HENT:     Head: Normocephalic and atraumatic.  Eyes:     Pupils: Pupils are equal, round, and reactive to light.  Neck:     Musculoskeletal: Normal range of motion and neck supple.  Cardiovascular:     Rate and Rhythm: Normal rate and regular rhythm.     Heart sounds: Normal heart sounds.  Pulmonary:     Effort: Pulmonary effort is normal. No respiratory distress.     Breath sounds: Normal breath sounds. No wheezing or rales.  Chest:     Chest wall: No tenderness.  Abdominal:     General: Bowel sounds are normal.     Palpations: Abdomen is soft.     Tenderness: There is no abdominal tenderness. There is no guarding or rebound.  Musculoskeletal: Normal range of motion.     Comments: Positive tenderness in her right mid back, no crepitus or deformity, no rashes.  No spinal tenderness  Lymphadenopathy:     Cervical: No cervical adenopathy.  Skin:    General: Skin is warm and dry.     Findings: No rash.  Neurological:     Mental Status: She is alert and  oriented to person, place, and time.     Sensory: No sensory deficit.     Motor: No weakness.     Gait: Gait normal.      ED Treatments / Results  Labs (all labs ordered are listed, but only abnormal results are displayed) Labs Reviewed - No data to display  EKG None  Radiology No results found.  Procedures Procedures (including critical care time)  Medications Ordered in ED Medications - No data to display   Initial Impression / Assessment and Plan / ED  Course  I have reviewed the triage vital signs and the nursing notes.  Pertinent labs & imaging results that were available during my care of the patient were reviewed by me and considered in my medical decision making (see chart for details).     Patient presents with chronic back and knee pain.  She is neurologically intact.  She has no suggestions of cauda equina.  No suggestions of infection.  She has been prescribed multiple different medications and I feel like this is an issue that needs to be followed by her primary care physician.  She does have prescriptions in the pharmacy that she has not picked up yet.  I advised her that would be better to pick up these prescriptions rather than trying a new medication.  She is amenable to this plan.  She does report some intermittent epigastric pain but her abdomen is completely nontender now.  This could be related to the NSAIDs that she has been using recently.  I will start her on Pepcid.  She had labs drawn earlier today which show a normal hemoglobin.  Her white count is normal.  She does have a minimal elevation in her lipase but she does not have any tenderness over her pancreas and her other LFTs are normal.  There is no pain over her gallbladder.  She also has some suggestions of a UTI.  She reportedly had a recent culture that was positive and was prescribed Augmentin but she never picked it up.  I will prescribe her Keflex.  I also prescribed her Pepcid for her  epigastric discomfort.  I encouraged patient to follow-up with her PCP.  She was given return precautions.  Final Clinical Impressions(s) / ED Diagnoses   Final diagnoses:  Chronic right-sided thoracic back pain  Gastritis without bleeding, unspecified chronicity, unspecified gastritis type  Urinary tract infection without hematuria, site unspecified    ED Discharge Orders         Ordered    famotidine (PEPCID) 20 MG tablet  2 times daily     11/04/18 2012    cephALEXin (KEFLEX) 500 MG capsule  2 times daily     11/04/18 2012           Rolan Bucco, MD 11/04/18 2018

## 2018-11-04 NOTE — ED Triage Notes (Signed)
Pt complains of right upper back pain and left wrist pain. Pt had hand surgery a couple of months ago. Pt states she fell down stairs a couple of months ago. Pt also complains of abdominal pain and headache. Pt states she had some diarrhea, denied emesis.

## 2018-11-04 NOTE — ED Notes (Signed)
Called Pt in lobby for vital recheck, no response in lobby x1. 

## 2018-11-04 NOTE — ED Notes (Signed)
Called Pt to be roomed no response in lobby x3.

## 2018-11-23 ENCOUNTER — Emergency Department (HOSPITAL_COMMUNITY)
Admission: EM | Admit: 2018-11-23 | Discharge: 2018-11-23 | Disposition: A | Discharge status: Home / Self Care | Payer: Medicaid Other | Attending: Emergency Medicine | Admitting: Emergency Medicine

## 2018-11-23 ENCOUNTER — Other Ambulatory Visit: Payer: Self-pay

## 2018-11-23 ENCOUNTER — Encounter (HOSPITAL_COMMUNITY): Payer: Self-pay | Admitting: Emergency Medicine

## 2018-11-23 DIAGNOSIS — M25532 Pain in left wrist: Secondary | ICD-10-CM | POA: Diagnosis not present

## 2018-11-23 DIAGNOSIS — R202 Paresthesia of skin: Secondary | ICD-10-CM | POA: Diagnosis not present

## 2018-11-23 DIAGNOSIS — M25531 Pain in right wrist: Secondary | ICD-10-CM

## 2018-11-23 DIAGNOSIS — R200 Anesthesia of skin: Secondary | ICD-10-CM | POA: Diagnosis not present

## 2018-11-23 DIAGNOSIS — Z79899 Other long term (current) drug therapy: Secondary | ICD-10-CM | POA: Insufficient documentation

## 2018-11-23 MED ORDER — METHOCARBAMOL 500 MG PO TABS: 500.0000 mg | ORAL_TABLET | Freq: Two times a day (BID) | ORAL | 0 refills | Status: DC

## 2018-11-23 NOTE — Discharge Instructions (Signed)
You can take Tylenol or Ibuprofen as directed for pain. You can alternate Tylenol and Ibuprofen every 4 hours. If you take Tylenol at 1pm, then you can take Ibuprofen at 5pm. Then you can take Tylenol again at 9pm.   Take Robaxin as prescribed. This medication will make you drowsy so do not drive or drink alcohol when taking it.  Follow-up with your primary care doctor in 1 week.  As we discussed, you need to follow-up with the surgeon who did your surgery.  Return emergency department for any fevers, redness or swelling of the hands or wrist, discoloration of the hands or wrist or any other worsening or concerning symptoms.

## 2018-11-23 NOTE — ED Triage Notes (Signed)
Patient c/o ,intermittent bilateral hand pain radiating up arm since hand surgery in September.

## 2018-11-23 NOTE — ED Provider Notes (Signed)
[facility]-EMERGENCY DEPT Provider Note   CSN: [id number] Arrival date & time: 11/23/18  1735     History   Chief Complaint Chief Complaint  Patient presents with  . Hand Pain    HPI TOMECIA Collier is a 63 y.o. female possible history of chronic back pain, chronic knee pain, diverticulitis, schizophrenia who presents for evaluation of pain in bilateral wrists and hands.  Patient reports that she has had this pain for the last 4 months ever since she had carpal tunnel surgery on her left upper hand in September 2019.  She reports that Dr. Orlan Leavens did the surgery.  She states she has not contacted him about the continued pain.  Patient states that she has an appointment with him on 11/28/18.  Patient states she comes in today because she continues to have pain.  Patient reports she will intermittently have some numbness and tingling in her fingers.  Patient denies any current numbness/weakness at this time.  She states she has noticed some swelling in the left hand ever since the surgery but denies any changes in swelling.  Patient has not noticed any overlying warmth, erythema.  Patient denies any fevers.  She reports she has been seen in the ED for similar pain before and they have given her muscle pills and other medications she has been taking.  She denies any other over-the-counter meds that she takes for pain.  She has been able to ambulate.  Patient denies any new trauma, injury, fall.  Patient denies any fevers.  The history is provided by the patient.    Past Medical History:  Diagnosis Date  . Chronic back pain   . Chronic knee pain   . Diverticulitis   . Schizophrenia [facility])     Patient Active Problem List   Diagnosis Date Noted  . Diarrhea 07/25/2018  . Pain in left wrist 07/20/2017  . Schizophrenia (HCC) 05/01/2017  . Chronic pain of left knee 02/09/2017  . Unilateral primary osteoarthritis, left knee 02/09/2017  . Neuropathy 11/22/2016  .  Gastroesophageal reflux disease without esophagitis 11/22/2016  . Weight gain 11/22/2016  . Atypical chest pain 11/04/2016    Past Surgical History:  Procedure Laterality Date  . ABDOMINAL HYSTERECTOMY    . CHOLECYSTECTOMY    . WRIST SURGERY Left      OB History   No obstetric history on file.      Home Medications    Prior to Admission medications   Medication Sig Start Date End Date Taking? Authorizing Provider  cephALEXin (KEFLEX) 500 MG capsule Take 1 capsule (500 mg total) by mouth 2 (two) times daily. 11/04/18   Rolan Bucco, MD  cyclobenzaprine (FLEXERIL) 10 MG tablet Take 1 tablet (10 mg total) by mouth 2 (two) times daily as needed for muscle spasms. 10/19/18   Wynetta Fines, MD  diclofenac sodium (VOLTAREN) 1 % GEL Apply 4 g topically 4 (four) times daily. Patient not taking: Reported on 10/27/2018 10/27/18   Kallie Locks, FNP  famotidine (PEPCID) 20 MG tablet Take 1 tablet (20 mg total) by mouth 2 (two) times daily. 11/04/18   Rolan Bucco, MD  gabapentin (NEURONTIN) 300 MG capsule Take 1 capsule (300 mg total) by mouth 2 (two) times daily. Patient not taking: Reported on 10/27/2018 10/27/18 02/24/19  Kallie Locks, FNP  methocarbamol (ROBAXIN) 500 MG tablet Take 1 tablet (500 mg total) by mouth 2 (two) times daily. 11/23/18   Maxwell Caul, PA-C  ondansetron (  ZOFRAN) 4 MG tablet Take 1 tablet (4 mg total) by mouth every 8 (eight) hours as needed for nausea or vomiting. Patient not taking: Reported on 10/27/2018 08/25/18   Kallie Locks, FNP  vitamin C (ASCORBIC ACID) 500 MG tablet Take 1 tablet (500 mg total) by mouth daily. Patient not taking: Reported on 10/27/2018 10/27/18   Kallie Locks, FNP    Family History Family History  Problem Relation Age of Onset  . Other Mother        no health conditions per patient  . Other Father        no health conditions per patient    Social History Social History   Tobacco Use  . Smoking  status: Never Smoker  . Smokeless tobacco: Never Used  Substance Use Topics  . Alcohol use: No  . Drug use: No     Allergies   Lidocaine   Review of Systems Review of Systems  Constitutional: Negative for fever.  Musculoskeletal:       Hand pain bilaterally  Skin: Negative for color change.  Neurological: Positive for numbness (Intermittent).  All other systems reviewed and are negative.    Physical Exam Updated Vital Signs BP 134/79 (BP Location: Right Arm)   Pulse 81   Temp 98 F (36.7 C) (Oral)   Resp 13   Ht 5\' 6"  (1.676 m)   Wt 73.9 kg   SpO2 93%   BMI 26.31 kg/m   Physical Exam Vitals signs and nursing note reviewed.  Constitutional:      Appearance: She is well-developed.  HENT:     Head: Normocephalic and atraumatic.  Eyes:     General: No scleral icterus.       Right eye: No discharge.        Left eye: No discharge.     Conjunctiva/sclera: Conjunctivae normal.  Cardiovascular:     Pulses:          Radial pulses are 2+ on the right side and 2+ on the left side.  Pulmonary:     Effort: Pulmonary effort is normal.  Musculoskeletal:     Comments: Well-healed surgical incision scar noted to left wrist.  No surrounding warmth, erythema.  There is some slight edema noted to the left wrist.  No deformity or crepitus noted to palpation of left wrist.  Flexion section of left wrist intact any difficulty.  No bony tenderness noted to left forearm, left elbow.  Diffuse tenderness palpation noted to right wrist.  No deformity or crepitus noted.  No overlying warmth or erythema, edema.  Flexion/tension intact on difficulty.  Patient can wiggle all 5 fingers of both hands without difficulty.  She can easily make a fist with both hands.  Positive Phalen's and Tinel sign on both wrists.   Skin:    General: Skin is warm and dry.     Capillary Refill: Capillary refill takes less than 2 seconds.     Comments: Good distal cap refill. BUE are not dusky in appearance or  cool to touch.  No rash noted on palms.  Neurological:     Mental Status: She is alert.     Comments: Sensation intact along major nerve distributions of BUE Equal grip strength bilaterally  Psychiatric:        Speech: Speech normal.        Behavior: Behavior normal.      ED Treatments / Results  Labs (all labs ordered are listed, but only abnormal results  are displayed) Labs Reviewed - No data to display  EKG None  Radiology No results found.  Procedures Procedures (including critical care time)  Medications Ordered in ED Medications - No data to display   Initial Impression / Assessment and Plan / ED Course  I have reviewed the triage vital signs and the nursing notes.  Pertinent labs & imaging results that were available during my care of the patient were reviewed by me and considered in my medical decision making (see chart for details).     63 year old female who presents for evaluation of bilateral hand pain that is been ongoing since October 2018.  She had a history of carpal tunnel release by Dr. Orlan Leavensrtman reports that since then, she has had pain in both hands.  She is scheduled to follow-up with Dr. Melvyn Novasrtmann on 11/28/18.  Does report some intermittent numbness and tingling sensation to the tips of her fingers.  No numbness at this time.  She does have some edema of the left wrist which she states is been there since the surgery.  No overlying warmth, erythema.  No fevers. Patient is afebrile, non-toxic appearing, sitting comfortably on examination table. Vital signs reviewed and stable.  On exam, patient has positive Tinel's, Phalen's.  Concern for carpal tunnel syndrome versus neuropathy pain versus chronic pain.Marland Kitchen.  History/physical exam is not concerning for arthritis, acute arterial embolism, DVT of upper extremity.  Additionally, do not suspect CVA given history/physical exam.  Patient has been seen in the ED multiple times and has recorded 18 visits over last 6 months.   She has had similar complaints previously throughout multiple ED visits.  Suspect her symptoms may be related to chronic pain. At this time, patient exhibits no emergent life-threatening condition that require further evaluation in ED. he is in bilateral splints for supportive care.  Encourage follow-up with her surgeon. Patient had ample opportunity for questions and discussion. All patient's questions were answered with full understanding. Strict return precautions discussed. Patient expresses understanding and agreement to plan.   Portions of this note were generated with Scientist, clinical (histocompatibility and immunogenetics)Dragon dictation software. Dictation errors may occur despite best attempts at proofreading.    Final Clinical Impressions(s) / ED Diagnoses   Final diagnoses:  Pain in both wrists  Paresthesia    ED Discharge Orders         Ordered    methocarbamol (ROBAXIN) 500 MG tablet  2 times daily     11/23/18 2036           Rosana HoesLayden, Mirella Gueye A, PA-C 11/23/18 2040    Charlynne PanderYao, David Hsienta, MD 11/23/18 2249

## 2018-12-24 ENCOUNTER — Emergency Department (HOSPITAL_COMMUNITY)
Admission: EM | Admit: 2018-12-24 | Discharge: 2018-12-24 | Discharge status: Absent without leave | Payer: Medicaid Other

## 2018-12-26 ENCOUNTER — Other Ambulatory Visit: Payer: Self-pay

## 2018-12-26 ENCOUNTER — Encounter (HOSPITAL_COMMUNITY): Payer: Self-pay | Admitting: *Deleted

## 2018-12-26 ENCOUNTER — Emergency Department (HOSPITAL_COMMUNITY)
Admission: EM | Admit: 2018-12-26 | Discharge: 2018-12-26 | Disposition: A | Discharge status: Home / Self Care | Payer: Medicaid Other | Attending: Emergency Medicine | Admitting: Emergency Medicine

## 2018-12-26 ENCOUNTER — Emergency Department (HOSPITAL_COMMUNITY): Payer: Medicaid Other

## 2018-12-26 DIAGNOSIS — M25532 Pain in left wrist: Secondary | ICD-10-CM | POA: Diagnosis present

## 2018-12-26 DIAGNOSIS — Z79899 Other long term (current) drug therapy: Secondary | ICD-10-CM | POA: Insufficient documentation

## 2018-12-26 DIAGNOSIS — X58XXXS Exposure to other specified factors, sequela: Secondary | ICD-10-CM | POA: Diagnosis not present

## 2018-12-26 DIAGNOSIS — S52515S Nondisplaced fracture of left radial styloid process, sequela: Secondary | ICD-10-CM | POA: Insufficient documentation

## 2018-12-26 NOTE — ED Triage Notes (Signed)
Pt reports bilateral hand pain.  Pt reports that she had surgery on August 12, 2018 on the left hand and since then pt has been having pain.  Pt reports numbness and a gritty sensation.  The left hand appear swollen but pt has full ROM.  Pt denies any injuries to hands.

## 2018-12-26 NOTE — ED Provider Notes (Signed)
[facility]-EMERGENCY DEPT Provider Note   CSN: [id number] Arrival date & time: 12/26/18  9323     History   Chief Complaint Chief Complaint  Patient presents with  . Hand Pain    bilateral    HPI Felicia Collier is a 63 y.o. female.  HPI   She complains of bilateral hand pain felt as a tingling sensation.  She complains of ongoing swelling in her left wrist, following surgery in September 2019.  The bilateral hand discomfort has been present since the surgery.  Last week she tried to get an appointment with her hand surgeon, but was unable to.  She states that she has wrist braces at home but does not wear them because "they make my hands hurt."  She denies fever, chills, nausea, vomiting, weakness or dizziness.  There are no other known modifying factors.  Past Medical History:  Diagnosis Date  . Chronic back pain   . Chronic knee pain   . Diverticulitis   . Schizophrenia [facility]     Patient Active Problem List   Diagnosis Date Noted  . Diarrhea 07/25/2018  . Pain in left wrist 07/20/2017  . Schizophrenia (HCC) 05/01/2017  . Chronic pain of left knee 02/09/2017  . Unilateral primary osteoarthritis, left knee 02/09/2017  . Neuropathy 11/22/2016  . Gastroesophageal reflux disease without esophagitis 11/22/2016  . Weight gain 11/22/2016  . Atypical chest pain 11/04/2016    Past Surgical History:  Procedure Laterality Date  . ABDOMINAL HYSTERECTOMY    . CHOLECYSTECTOMY    . KNEE SURGERY Left   . WRIST SURGERY Left      OB History   No obstetric history on file.      Home Medications    Prior to Admission medications   Medication Sig Start Date End Date Taking? Authorizing Provider  cephALEXin (KEFLEX) 500 MG capsule Take 1 capsule (500 mg total) by mouth 2 (two) times daily. 11/04/18   Rolan Bucco, MD  cyclobenzaprine (FLEXERIL) 10 MG tablet Take 1 tablet (10 mg total) by mouth 2 (two) times daily as needed for muscle spasms. 10/19/18    Wynetta Fines, MD  diclofenac sodium (VOLTAREN) 1 % GEL Apply 4 g topically 4 (four) times daily. Patient not taking: Reported on 10/27/2018 10/27/18   Kallie Locks, FNP  famotidine (PEPCID) 20 MG tablet Take 1 tablet (20 mg total) by mouth 2 (two) times daily. 11/04/18   Rolan Bucco, MD  gabapentin (NEURONTIN) 300 MG capsule Take 1 capsule (300 mg total) by mouth 2 (two) times daily. Patient not taking: Reported on 10/27/2018 10/27/18 02/24/19  Kallie Locks, FNP  methocarbamol (ROBAXIN) 500 MG tablet Take 1 tablet (500 mg total) by mouth 2 (two) times daily. 11/23/18   Maxwell Caul, PA-C  ondansetron (ZOFRAN) 4 MG tablet Take 1 tablet (4 mg total) by mouth every 8 (eight) hours as needed for nausea or vomiting. Patient not taking: Reported on 10/27/2018 08/25/18   Kallie Locks, FNP  vitamin C (ASCORBIC ACID) 500 MG tablet Take 1 tablet (500 mg total) by mouth daily. Patient not taking: Reported on 10/27/2018 10/27/18   Kallie Locks, FNP    Family History Family History  Problem Relation Age of Onset  . Other Mother        no health conditions per patient  . Other Father        no health conditions per patient    Social History Social History  Tobacco Use  . Smoking status: Never Smoker  . Smokeless tobacco: Never Used  Substance Use Topics  . Alcohol use: No  . Drug use: No     Allergies   Lidocaine   Review of Systems Review of Systems  All other systems reviewed and are negative.    Physical Exam Updated Vital Signs BP (!) 152/83 (BP Location: Right Arm)   Pulse 65   Temp 98.7 F (37.1 C) (Oral)   Resp 14   Ht 5\' 6"  (1.676 m)   Wt 71.7 kg   SpO2 100%   BMI 25.50 kg/m   Physical Exam Vitals signs and nursing note reviewed.  Constitutional:      General: She is not in acute distress.    Appearance: Normal appearance. She is well-developed. She is not ill-appearing or diaphoretic.  HENT:     Head: Normocephalic and  atraumatic.     Right Ear: External ear normal.     Left Ear: External ear normal.     Nose: No congestion or rhinorrhea.     Mouth/Throat:     Pharynx: No oropharyngeal exudate or posterior oropharyngeal erythema.  Eyes:     Conjunctiva/sclera: Conjunctivae normal.     Pupils: Pupils are equal, round, and reactive to light.  Neck:     Musculoskeletal: Normal range of motion and neck supple.     Trachea: Phonation normal.  Cardiovascular:     Rate and Rhythm: Normal rate.  Pulmonary:     Effort: Pulmonary effort is normal.  Musculoskeletal:     Comments: Left wrist with tenderness and swelling radial aspect, with somewhat diminished motion at this joint secondary to discomfort.  No overlying erythema of the left wrist abnormality.  Right wrist with normal appearance and normal motion.  Bilateral hands fingers have normal range of motion.  Capillary refill in fingers of hands less than 2 seconds.  Skin:    General: Skin is warm and dry.  Neurological:     Mental Status: She is alert and oriented to person, place, and time.     Cranial Nerves: No cranial nerve deficit.     Motor: No weakness or abnormal muscle tone.     Coordination: Coordination normal.     Comments: Neuro light touch dysesthesia of the fingers of both hands.  Psychiatric:        Behavior: Behavior normal.        Thought Content: Thought content normal.        Judgment: Judgment normal.      ED Treatments / Results  Labs (all labs ordered are listed, but only abnormal results are displayed) Labs Reviewed - No data to display  EKG None  Radiology Dg Wrist Complete Left  Result Date: 12/26/2018 CLINICAL DATA:  Acute on chronic BILATERAL wrist pain. Current history of carpal tunnel syndrome involving the LEFT wrist. Radial side LEFT wrist swelling. No recent injuries. EXAM: LEFT WRIST - COMPLETE 3+ VIEW COMPARISON:  06/24/2018, 06/17/2016, 06/07/2016. FINDINGS: Postsurgical changes with resection of the  scaphoid, lunate and triquetrum. Post surgical change or early remote fracture involving the radial styloid, new since August, 2019, but with smooth, well corticated margins. No fractures elsewhere. Marked diffuse soft tissue swelling. Moderate to large joint effusion. IMPRESSION: 1. No acute osseous abnormality. 2. Postsurgical changes with resection of the scaphoid, lunate and triquetrum. 3. Marked diffuse soft tissue swelling and moderate to large joint effusion. Electronically Signed   By: Hulan Saashomas  Lawrence M.D.   On:  12/26/2018 09:45   Dg Wrist Complete Right  Result Date: 12/26/2018 CLINICAL DATA:  Acute on chronic BILATERAL wrist pain. Current history of carpal tunnel syndrome involving the LEFT wrist. Radial side LEFT wrist swelling. No recent injuries. EXAM: RIGHT WRIST - COMPLETE 3+ VIEW COMPARISON:  None. FINDINGS: No evidence of acute, subacute or healed fractures. Accessory ossicle at the radial styloid. Joint spaces well-preserved. Bone mineral density well-preserved. IMPRESSION: No acute, subacute or significant abnormality. Electronically Signed   By: Hulan Saas M.D.   On: 12/26/2018 09:47    Procedures Procedures (including critical care time)  Medications Ordered in ED Medications - No data to display   Initial Impression / Assessment and Plan / ED Course  I have reviewed the triage vital signs and the nursing notes.  Pertinent labs & imaging results that were available during my care of the patient were reviewed by me and considered in my medical decision making (see chart for details).      No data found.  11:09 AM Reevaluation with update and discussion. After initial assessment and treatment, an updated evaluation reveals no change in clinical status.  Findings discussed with the patient and all questions were answered. Mancel Bale   Medical Decision Making: Nonspecific tingling fingers.  Consider carpal tunnel syndrome.  Chronic pain and swelling left wrist,  status post resection of the carpal bones.  Newly found subacute appearing radial styloid fracture.  This is possibly associated with the prior surgery.  Patient does not have focal tenderness at the site of this fracture.  Also, she does not have trauma to explain this fracture appearance.  No indication for further ED evaluation at this time  CRITICAL CARE-no Performed by: Mancel Bale  Nursing Notes Reviewed/ Care Coordinated Applicable Imaging Reviewed Interpretation of Laboratory Data incorporated into ED treatment  The patient appears reasonably screened and/or stabilized for discharge and I doubt any other medical condition or other [facility] requiring further screening, evaluation, or treatment in the ED at this time prior to discharge.  Plan: Home Medications-ibuprofen for pain, continue usual medications; Home Treatments-heat to affected area use splint on left wrist, patient has 1 at home currently; return here if the recommended treatment, does not improve the symptoms; Recommended follow up-PCP and hand surgery PRN   Final Clinical Impressions(s) / ED Diagnoses   Final diagnoses:  Closed nondisplaced fracture of styloid process of left radius, sequela    ED Discharge Orders    None       Mancel Bale, MD 12/27/18 1545

## 2018-12-26 NOTE — ED Notes (Signed)
Dr. Wentz at bedside. 

## 2018-12-26 NOTE — Discharge Instructions (Addendum)
Wear the brace on your left wrist as much as possible to help the discomfort.  Try using heat on the sore area of your wrists 3 or 4 times a day.  For pain or swelling, use ibuprofen 400 mg 3 times a day with meals.

## 2018-12-26 NOTE — ED Notes (Signed)
Pt refused discharge vital signs

## 2019-01-01 ENCOUNTER — Ambulatory Visit (INDEPENDENT_AMBULATORY_CARE_PROVIDER_SITE_OTHER): Payer: Medicaid Other | Admitting: Family Medicine

## 2019-01-01 ENCOUNTER — Encounter: Payer: Self-pay | Admitting: Family Medicine

## 2019-01-01 VITALS — BP 132/70 | HR 78 | Temp 98.0°F | Ht 66.0 in | Wt 162.0 lb

## 2019-01-01 DIAGNOSIS — F203 Undifferentiated schizophrenia: Secondary | ICD-10-CM | POA: Diagnosis not present

## 2019-01-01 DIAGNOSIS — M545 Low back pain, unspecified: Secondary | ICD-10-CM | POA: Insufficient documentation

## 2019-01-01 DIAGNOSIS — M25562 Pain in left knee: Secondary | ICD-10-CM

## 2019-01-01 DIAGNOSIS — G8929 Other chronic pain: Secondary | ICD-10-CM

## 2019-01-01 DIAGNOSIS — Z09 Encounter for follow-up examination after completed treatment for conditions other than malignant neoplasm: Secondary | ICD-10-CM | POA: Diagnosis not present

## 2019-01-01 LAB — POCT URINALYSIS DIP (MANUAL ENTRY)
Bilirubin, UA: NEGATIVE
Blood, UA: NEGATIVE
Glucose, UA: NEGATIVE mg/dL
Ketones, POC UA: NEGATIVE mg/dL
Nitrite, UA: NEGATIVE
Protein Ur, POC: NEGATIVE mg/dL
Spec Grav, UA: 1.01 (ref 1.010–1.025)
Urobilinogen, UA: 0.2 E.U./dL
pH, UA: 6.5 (ref 5.0–8.0)

## 2019-01-01 MED ORDER — GABAPENTIN 300 MG PO CAPS: ORAL_CAPSULE | ORAL | 3 refills | Status: DC

## 2019-01-01 MED ORDER — METHOCARBAMOL 500 MG PO TABS: 500.0000 mg | ORAL_TABLET | Freq: Two times a day (BID) | ORAL | 3 refills | Status: DC

## 2019-01-01 NOTE — Patient Instructions (Signed)
Chronic Pain, Adult Chronic pain is a type of pain that lasts or keeps coming back (recurs) for at least six months. You may have chronic headaches, abdominal pain, or body pain. Chronic pain may be related to an illness, such as fibromyalgia or complex regional pain syndrome. Sometimes the cause of chronic pain is not known. Chronic pain can make it hard for you to do daily activities. If not treated, chronic pain can lead to other health problems, including anxiety and depression. Treatment depends on the cause and severity of your pain. You may need to work with a pain specialist to come up with a treatment plan. The plan may include medicine, counseling, and physical therapy. Many people benefit from a combination of two or more types of treatment to control their pain. Follow these instructions at home: Lifestyle  Consider keeping a pain diary to share with your health care providers.  Consider talking with a mental health care provider (psychologist) about how to cope with chronic pain.  Consider joining a chronic pain support group.  Try to control or lower your stress levels. Talk to your health care provider about strategies to do this. General instructions   Take over-the-counter and prescription medicines only as told by your health care provider.  Follow your treatment plan as told by your health care provider. This may include: ? Gentle, regular exercise. ? Eating a healthy diet that includes foods such as vegetables, fruits, fish, and lean meats. ? Cognitive or behavioral therapy. ? Working with a Adult nurse. ? Meditation or yoga. ? Acupuncture or massage therapy. ? Aroma, color, light, or sound therapy. ? Local electrical stimulation. ? Shots (injections) of numbing or pain-relieving medicines into the spine or the area of pain.  Check your pain level as told by your health care provider. Ask your health care provider if you should use a pain scale.  Learn as  much as you can about how to manage your chronic pain. Ask your health care provider if an intensive pain rehabilitation program or a chronic pain specialist would be helpful.  Keep all follow-up visits as told by your health care provider. This is important. Contact a health care provider if:  Your pain gets worse.  You have new pain.  You have trouble sleeping.  You have trouble doing your normal activities.  Your pain is not controlled with treatment.  Your have side effects from pain medicine.  You feel weak. Get help right away if:  You lose feeling or have numbness in your body.  You lose control of bowel or bladder function.  Your pain suddenly gets much worse.  You develop shaking or chills.  You develop confusion.  You develop chest pain.  You have trouble breathing or shortness of breath.  You pass out.  You have thoughts about hurting yourself or others. This information is not intended to replace advice given to you by your health care provider. Make sure you discuss any questions you have with your health care provider. Document Released: 07/24/2002 Document Revised: 07/01/2016 Document Reviewed: 04/20/2016 Elsevier Interactive Patient Education  2019 Elsevier Inc. Methocarbamol tablets What is this medicine? METHOCARBAMOL (meth oh KAR ba mole) helps to relieve pain and stiffness in muscles caused by strains, sprains, or other injury to your muscles. This medicine may be used for other purposes; ask your health care provider or pharmacist if you have questions. COMMON BRAND NAME(S): Robaxin What should I tell my health care provider before I take this  medicine? They need to know if you have any of these conditions: -kidney disease -seizures -an unusual or allergic reaction to methocarbamol, other medicines, foods, dyes, or preservatives -pregnant or trying to get pregnant -breast-feeding How should I use this medicine? Take this medicine by mouth  with a full glass of water. Follow the directions on the prescription label. Take your medicine at regular intervals. Do not take your medicine more often than directed. Talk to your pediatrician regarding the use of this medicine in children. Special care may be needed. Overdosage: If you think you have taken too much of this medicine contact a poison control center or emergency room at once. NOTE: This medicine is only for you. Do not share this medicine with others. What if I miss a dose? If you miss a dose, take it as soon as you can. If it is almost time for your next dose, take only the next dose. Do not take double or extra doses. What may interact with this medicine? Do not take this medication with any of the following medicines: -narcotic medicines for cough This medicine may also interact with the following medications: -alcohol -antihistamines for allergy, cough and cold -certain medicines for anxiety or sleep -certain medicines for depression like amitriptyline, fluoxetine, sertraline -certain medicines for seizures like phenobarbital, primidone -cholinesterase inhibitors like neostigmine, ambenonium, and pyridostigmine bromide -general anesthetics like halothane, isoflurane, methoxyflurane, propofol -local anesthetics like lidocaine, pramoxine, tetracaine -medicines that relax muscles for surgery -narcotic medicines for pain -phenothiazines like chlorpromazine, mesoridazine, prochlorperazine, thioridazine This list may not describe all possible interactions. Give your health care provider a list of all the medicines, herbs, non-prescription drugs, or dietary supplements you use. Also tell them if you smoke, drink alcohol, or use illegal drugs. Some items may interact with your medicine. What should I watch for while using this medicine? Tell your doctor or health care professional if your symptoms do not start to get better or if they get worse. You may get drowsy or dizzy. Do  not drive, use machinery, or do anything that needs mental alertness until you know how this medicine affects you. Do not stand or sit up quickly, especially if you are an older patient. This reduces the risk of dizzy or fainting spells. Alcohol may interfere with the effect of this medicine. Avoid alcoholic drinks. If you are taking another medicine that also causes drowsiness, you may have more side effects. Give your health care provider a list of all medicines you use. Your doctor will tell you how much medicine to take. Do not take more medicine than directed. Call emergency for help if you have problems breathing or unusual sleepiness. What side effects may I notice from receiving this medicine? Side effects that you should report to your doctor or health care professional as soon as possible: -allergic reactions like skin rash, itching or hives, swelling of the face, lips, or tongue -breathing problems -confusion -seizures -unusually weak or tired Side effects that usually do not require medical attention (report to your doctor or health care professional if they continue or are bothersome): -dizziness -headache -metallic taste -tiredness -upset stomach This list may not describe all possible side effects. Call your doctor for medical advice about side effects. You may report side effects to FDA at 1-800-FDA-1088. Where should I keep my medicine? Keep out of the reach of children. Store at room temperature between 20 and 25 degrees C (68 and 77 degrees F). Keep container tightly closed. Throw away any unused  medicine after the expiration date. NOTE: This sheet is a summary. It may not cover all possible information. If you have questions about this medicine, talk to your doctor, pharmacist, or health care provider.  2019 Elsevier/Gold Standard (2015-08-12 13:11:54) Gabapentin capsules or tablets What is this medicine? GABAPENTIN (GA ba pen tin) is used to control seizures in certain  types of epilepsy. It is also used to treat certain types of nerve pain. This medicine may be used for other purposes; ask your health care provider or pharmacist if you have questions. COMMON BRAND NAME(S): Active-PAC with Gabapentin, Gabarone, Neurontin What should I tell my health care provider before I take this medicine? They need to know if you have any of these conditions: -kidney disease -suicidal thoughts, plans, or attempt; a previous suicide attempt by you or a family member -an unusual or allergic reaction to gabapentin, other medicines, foods, dyes, or preservatives -pregnant or trying to get pregnant -breast-feeding How should I use this medicine? Take this medicine by mouth with a glass of water. Follow the directions on the prescription label. You can take it with or without food. If it upsets your stomach, take it with food. Take your medicine at regular intervals. Do not take it more often than directed. Do not stop taking except on your doctor's advice. If you are directed to break the 600 or 800 mg tablets in half as part of your dose, the extra half tablet should be used for the next dose. If you have not used the extra half tablet within 28 days, it should be thrown away. A special MedGuide will be given to you by the pharmacist with each prescription and refill. Be sure to read this information carefully each time. Talk to your pediatrician regarding the use of this medicine in children. While this drug may be prescribed for children as young as 3 years for selected conditions, precautions do apply. Overdosage: If you think you have taken too much of this medicine contact a poison control center or emergency room at once. NOTE: This medicine is only for you. Do not share this medicine with others. What if I miss a dose? If you miss a dose, take it as soon as you can. If it is almost time for your next dose, take only that dose. Do not take double or extra doses. What may  interact with this medicine? Do not take this medicine with any of the following medications: -other gabapentin products This medicine may also interact with the following medications: -alcohol -antacids -antihistamines for allergy, cough and cold -certain medicines for anxiety or sleep -certain medicines for depression or psychotic disturbances -homatropine; hydrocodone -naproxen -narcotic medicines (opiates) for pain -phenothiazines like chlorpromazine, mesoridazine, prochlorperazine, thioridazine This list may not describe all possible interactions. Give your health care provider a list of all the medicines, herbs, non-prescription drugs, or dietary supplements you use. Also tell them if you smoke, drink alcohol, or use illegal drugs. Some items may interact with your medicine. What should I watch for while using this medicine? Visit your doctor or health care professional for regular checks on your progress. You may want to keep a record at home of how you feel your condition is responding to treatment. You may want to share this information with your doctor or health care professional at each visit. You should contact your doctor or health care professional if your seizures get worse or if you have any new types of seizures. Do not stop taking this  medicine or any of your seizure medicines unless instructed by your doctor or health care professional. Stopping your medicine suddenly can increase your seizures or their severity. Wear a medical identification bracelet or chain if you are taking this medicine for seizures, and carry a card that lists all your medications. You may get drowsy, dizzy, or have blurred vision. Do not drive, use machinery, or do anything that needs mental alertness until you know how this medicine affects you. To reduce dizzy or fainting spells, do not sit or stand up quickly, especially if you are an older patient. Alcohol can increase drowsiness and dizziness. Avoid  alcoholic drinks. Your mouth may get dry. Chewing sugarless gum or sucking hard candy, and drinking plenty of water will help. The use of this medicine may increase the chance of suicidal thoughts or actions. Pay special attention to how you are responding while on this medicine. Any worsening of mood, or thoughts of suicide or dying should be reported to your health care professional right away. Women who become pregnant while using this medicine may enroll in the [place] American Antiepileptic Drug Pregnancy Registry by calling (628) 355-0402. This registry collects information about the safety of antiepileptic drug use during pregnancy. What side effects may I notice from receiving this medicine? Side effects that you should report to your doctor or health care professional as soon as possible: -allergic reactions like skin rash, itching or hives, swelling of the face, lips, or tongue -worsening of mood, thoughts or actions of suicide or dying Side effects that usually do not require medical attention (report to your doctor or health care professional if they continue or are bothersome): -constipation -difficulty walking or controlling muscle movements -dizziness -nausea -slurred speech -tiredness -tremors -weight gain This list may not describe all possible side effects. Call your doctor for medical advice about side effects. You may report side effects to FDA at 1-800-FDA-1088. Where should I keep my medicine? Keep out of reach of children. This medicine may cause accidental overdose and death if it taken by other adults, children, or pets. Mix any unused medicine with a substance like cat litter or coffee grounds. Then throw the medicine away in a sealed container like a sealed bag or a coffee can with a lid. Do not use the medicine after the expiration date. Store at room temperature between 15 and 30 degrees C (59 and 86 degrees F). NOTE: This sheet is a summary. It may not cover all  possible information. If you have questions about this medicine, talk to your doctor, pharmacist, or health care provider.  2019 Elsevier/Gold Standard (2018-04-06 13:21:44)

## 2019-01-01 NOTE — Progress Notes (Signed)
[facility]  Hospital Follow Up   Subjective:  Patient ID: Felicia Collier, female    DOB: 07/14/1956  Age: 63 y.o. MRN: [id number]  CC:  Chief Complaint  Patient presents with  . Follow-up    chronic condition     HPI Felicia Collier is a 63 year old female who presents for Hospital Follow Up.   Past Medical History:  Diagnosis Date  . Chronic back pain   . Chronic knee pain   . Diverticulitis   . Schizophrenia (HCC)    Current Status: Since her last ED visit, she continues to have low back pain. She states that she is currently not taking any medications for pain at this time. Her anxiety is moderate today. She denies suicidal ideations, homicidal ideations, or auditory hallucinations.   She denies fevers, chills, fatigue, recent infections, weight loss, and night sweats. She has not had any headaches, visual changes, dizziness, and falls. No chest pain, heart palpitations, cough and shortness of breath reported. No reports of GI problems such as nausea, vomiting, diarrhea, and constipation. She has no reports of blood in stools, dysuria and hematuria.  Past Surgical History:  Procedure Laterality Date  . ABDOMINAL HYSTERECTOMY    . CHOLECYSTECTOMY    . KNEE SURGERY Left   . WRIST SURGERY Left     Family History  Problem Relation Age of Onset  . Other Mother        no health conditions per patient  . Other Father        no health conditions per patient    Social History   Socioeconomic History  . Marital status: Single    Spouse name: Not on file  . Number of children: Not on file  . Years of education: Not on file  . Highest education level: Not on file  Occupational History  . Not on file  Social Needs  . Financial resource strain: Not on file  . Food insecurity:    Worry: Not on file    Inability: Not on file  . Transportation needs:    Medical: Not on file    Non-medical: Not on file  Tobacco Use  .  Smoking status: Never Smoker  . Smokeless tobacco: Never Used  Substance and Sexual Activity  . Alcohol use: No  . Drug use: No  . Sexual activity: Not Currently  Lifestyle  . Physical activity:    Days per week: Not on file    Minutes per session: Not on file  . Stress: Not on file  Relationships  . Social connections:    Talks on phone: Not on file    Gets together: Not on file    Attends religious service: Not on file    Active member of club or organization: Not on file    Attends meetings of clubs or organizations: Not on file    Relationship status: Not on file  . Intimate partner violence:    Fear of current or ex partner: Not on file    Emotionally abused: Not on file    Physically abused: Not on file    Forced sexual activity: Not on file  Other Topics Concern  . Not on file  Social History Narrative  . Not on file    Outpatient Medications Prior to Visit  Medication Sig Dispense Refill  . diclofenac sodium (VOLTAREN) 1 % GEL Apply 4 g topically 4 (four) times daily. (  Patient not taking: Reported on 10/27/2018) 1 Tube 3  . famotidine (PEPCID) 20 MG tablet Take 1 tablet (20 mg total) by mouth 2 (two) times daily. (Patient not taking: Reported on 01/01/2019) 30 tablet 0  . ondansetron (ZOFRAN) 4 MG tablet Take 1 tablet (4 mg total) by mouth every 8 (eight) hours as needed for nausea or vomiting. (Patient not taking: Reported on 10/27/2018) 20 tablet 1  . vitamin C (ASCORBIC ACID) 500 MG tablet Take 1 tablet (500 mg total) by mouth daily. (Patient not taking: Reported on 10/27/2018) 30 tablet 11  . cephALEXin (KEFLEX) 500 MG capsule Take 1 capsule (500 mg total) by mouth 2 (two) times daily. (Patient not taking: Reported on 01/01/2019) 14 capsule 0  . cyclobenzaprine (FLEXERIL) 10 MG tablet Take 1 tablet (10 mg total) by mouth 2 (two) times daily as needed for muscle spasms. (Patient not taking: Reported on 01/01/2019) 15 tablet 0  . gabapentin (NEURONTIN) 300 MG capsule Take  1 capsule (300 mg total) by mouth 2 (two) times daily. (Patient not taking: Reported on 10/27/2018) 60 capsule 3  . methocarbamol (ROBAXIN) 500 MG tablet Take 1 tablet (500 mg total) by mouth 2 (two) times daily. (Patient not taking: Reported on 01/01/2019) 10 tablet 0   No facility-administered medications prior to visit.     Allergies  Allergen Reactions  . Lidocaine     Face swelling     ROS Review of Systems  Constitutional: Negative.   HENT: Negative.   Eyes: Negative.   Respiratory: Negative.   Cardiovascular: Negative.   Gastrointestinal: Negative.   Endocrine: Negative.   Genitourinary: Negative.   Musculoskeletal: Positive for arthralgias (chronic knee pain) and back pain (chronic).  Skin: Negative.   Allergic/Immunologic: Negative.   Neurological: Negative.   Hematological: Negative.   Psychiatric/Behavioral: Negative.    Objective:    Physical Exam  Constitutional: She is oriented to person, place, and time. She appears well-developed and well-nourished.  HENT:  Head: Normocephalic and atraumatic.  Eyes: Conjunctivae are normal.  Neck: Neck supple.  Cardiovascular: Normal rate, regular rhythm, normal heart sounds and intact distal pulses.  Pulmonary/Chest: Effort normal and breath sounds normal.  Abdominal: Soft. Bowel sounds are normal.  Musculoskeletal: Normal range of motion.  Neurological: She is alert and oriented to person, place, and time. She has normal reflexes.  Skin: Skin is warm and dry.  Psychiatric: She has a normal mood and affect. Her behavior is normal. Judgment and thought content normal.  Nursing note and vitals reviewed.  BP 132/70 (BP Location: Left Arm, Patient Position: Sitting, Cuff Size: Small)   Pulse 78   Temp 98 F (36.7 C) (Oral)   Ht 5\' 6"  (1.676 m)   Wt 162 lb (73.5 kg)   SpO2 99%   BMI 26.15 kg/m  Wt Readings from Last 3 Encounters:  01/01/19 162 lb (73.5 kg)  12/26/18 158 lb (71.7 kg)  11/23/18 163 lb (73.9 kg)    Health Maintenance Due  Topic Date Due  . COLONOSCOPY  03/11/2007   There are no preventive care reminders to display for this patient.  Lab Results  Component Value Date   TSH 0.99 11/22/2016   Lab Results  Component Value Date   WBC 9.9 11/04/2018   HGB 13.0 11/04/2018   HCT 43.9 11/04/2018   MCV 85.9 11/04/2018   PLT 274 11/04/2018   Lab Results  Component Value Date   NA 136 11/04/2018   K 3.7 11/04/2018   CO2  26 11/04/2018   GLUCOSE 92 11/04/2018   BUN 9 11/04/2018   CREATININE 0.73 11/04/2018   BILITOT 1.2 11/04/2018   ALKPHOS 89 11/04/2018   AST 18 11/04/2018   ALT 17 11/04/2018   PROT 7.3 11/04/2018   ALBUMIN 4.0 11/04/2018   CALCIUM 9.1 11/04/2018   ANIONGAP 9 11/04/2018   Lab Results  Component Value Date   CHOL 200 05/24/2016   Lab Results  Component Value Date   HDL 56 05/24/2016   Lab Results  Component Value Date   LDLCALC 122 05/24/2016   Lab Results  Component Value Date   TRIG 109 05/24/2016   Lab Results  Component Value Date   CHOLHDL 3.6 05/24/2016   Lab Results  Component Value Date   HGBA1C 5.6 06/30/2018    Assessment & Plan:   1. Hospital discharge follow-up  2. Low back pain, unspecified back pain laterality, unspecified chronicity, unspecified whether sciatica present She will re-start Gabapentin and Robaxin today. We will increase dose to 600 mg BID today.  - gabapentin (NEURONTIN) 300 MG capsule; Take 2 capsules 2 times a day as needed.  Dispense: 120 capsule; Refill: 3 - methocarbamol (ROBAXIN) 500 MG tablet; Take 1 tablet (500 mg total) by mouth 2 (two) times daily.  Dispense: 60 tablet; Refill: 3  3. Chronic pain of left knee - gabapentin (NEURONTIN) 300 MG capsule; Take 2 capsules 2 times a day as needed.  Dispense: 120 capsule; Refill: 3 - methocarbamol (ROBAXIN) 500 MG tablet; Take 1 tablet (500 mg total) by mouth 2 (two) times daily.  Dispense: 60 tablet; Refill: 3  4. Undifferentiated schizophrenia  (HCC) Stable today. She is not currently on any medications at this time for Schizophrenia.   5. Follow up She will follow up in 3 months.  - POCT urinalysis dipstick  Meds ordered this encounter  Medications  . gabapentin (NEURONTIN) 300 MG capsule    Sig: Take 2 capsules 2 times a day as needed.    Dispense:  120 capsule    Refill:  3  . methocarbamol (ROBAXIN) 500 MG tablet    Sig: Take 1 tablet (500 mg total) by mouth 2 (two) times daily.    Dispense:  60 tablet    Refill:  3    Orders Placed This Encounter  Procedures  . POCT urinalysis dipstick   Referral Orders  No referral(s) requested today   Raliegh IpNatalie Daejah Klebba,  MSN, FNP-C [facility] [address]  [place], [place] [id number] [phone]   Problem List Items Addressed This Visit      Other   Chronic pain of left knee   Relevant Medications   gabapentin (NEURONTIN) 300 MG capsule   methocarbamol (ROBAXIN) 500 MG tablet   Schizophrenia (HCC)    Other Visit Diagnoses    Hospital discharge follow-up    -  Primary   Low back pain, unspecified back pain laterality, unspecified chronicity, unspecified whether sciatica present       Relevant Medications   gabapentin (NEURONTIN) 300 MG capsule   methocarbamol (ROBAXIN) 500 MG tablet   Follow up       Relevant Orders   POCT urinalysis dipstick      Meds ordered this encounter  Medications  . gabapentin (NEURONTIN) 300 MG capsule    Sig: Take 2 capsules 2 times a day as needed.    Dispense:  120 capsule    Refill:  3  . methocarbamol (ROBAXIN) 500 MG  tablet    Sig: Take 1 tablet (500 mg total) by mouth 2 (two) times daily.    Dispense:  60 tablet    Refill:  3    Follow-up: Return in about 3 months (around 04/01/2019).    Kallie Locks, FNP

## 2019-01-10 ENCOUNTER — Other Ambulatory Visit: Payer: Self-pay | Admitting: Family Medicine

## 2019-01-16 ENCOUNTER — Encounter: Payer: Self-pay | Admitting: Family Medicine

## 2019-01-16 ENCOUNTER — Ambulatory Visit (INDEPENDENT_AMBULATORY_CARE_PROVIDER_SITE_OTHER): Payer: Medicaid Other | Admitting: Family Medicine

## 2019-01-16 VITALS — BP 140/72 | HR 60 | Temp 98.2°F | Ht 66.0 in | Wt 163.0 lb

## 2019-01-16 DIAGNOSIS — M25532 Pain in left wrist: Secondary | ICD-10-CM | POA: Diagnosis not present

## 2019-01-16 DIAGNOSIS — M25562 Pain in left knee: Secondary | ICD-10-CM

## 2019-01-16 DIAGNOSIS — F203 Undifferentiated schizophrenia: Secondary | ICD-10-CM | POA: Diagnosis not present

## 2019-01-16 DIAGNOSIS — G5602 Carpal tunnel syndrome, left upper limb: Secondary | ICD-10-CM | POA: Insufficient documentation

## 2019-01-16 DIAGNOSIS — Z09 Encounter for follow-up examination after completed treatment for conditions other than malignant neoplasm: Secondary | ICD-10-CM | POA: Diagnosis not present

## 2019-01-16 DIAGNOSIS — M545 Low back pain, unspecified: Secondary | ICD-10-CM

## 2019-01-16 DIAGNOSIS — G629 Polyneuropathy, unspecified: Secondary | ICD-10-CM

## 2019-01-16 DIAGNOSIS — G8929 Other chronic pain: Secondary | ICD-10-CM

## 2019-01-16 MED ORDER — IBUPROFEN 800 MG PO TABS: 800.0000 mg | ORAL_TABLET | Freq: Three times a day (TID) | ORAL | 3 refills | Status: DC | PRN

## 2019-01-16 MED ORDER — GABAPENTIN 300 MG PO CAPS: ORAL_CAPSULE | ORAL | 3 refills | Status: DC

## 2019-01-16 NOTE — Progress Notes (Signed)
[facility]  Hospital Follow Up  Subjective:  Patient ID: Felicia Collier, female    DOB: 14-Oct-1956  Age: 63 y.o. MRN: [id number]  CC:  Chief Complaint  Patient presents with  . Foot Pain  . Knee Pain    HPI Felicia Collier is a 63 year old female who presents for Hospital follow up.  Past Medical History:  Diagnosis Date  . Chronic back pain   . Chronic knee pain   . Diverticulitis   . Schizophrenia (HCC)    Current Status: Since her last ED visit, she has c/o chronic left arm pain. She has 'thrown away" her Gabapentin because she believes that it does not work. She has appointment with Psychiatrist on 01/24/2019.  She is very anxious today and has c/o "numbness and pain coming back" in her left arm and hands. She is moderately anxious today. She denies suicidal ideations, homicidal ideations, or auditory hallucinations. She is currently not taking any anxiety medications at this time.   She denies fevers, chills, fatigue, recent infections, weight loss, and night sweats. She has not had any headaches, visual changes, dizziness, and falls. No chest pain, heart palpitations, cough and shortness of breath reported. No reports of GI problems such as nausea, vomiting, diarrhea, and constipation. She has no reports of blood in stools, dysuria and hematuria.  Past Surgical History:  Procedure Laterality Date  . ABDOMINAL HYSTERECTOMY    . CHOLECYSTECTOMY    . KNEE SURGERY Left   . WRIST SURGERY Left     Family History  Problem Relation Age of Onset  . Other Mother        no health conditions per patient  . Other Father        no health conditions per patient    Social History   Socioeconomic History  . Marital status: Single    Spouse name: Not on file  . Number of children: Not on file  . Years of education: Not on file  . Highest education level: Not on file  Occupational History  . Not on file  Social Needs  .  Financial resource strain: Not on file  . Food insecurity:    Worry: Not on file    Inability: Not on file  . Transportation needs:    Medical: Not on file    Non-medical: Not on file  Tobacco Use  . Smoking status: Never Smoker  . Smokeless tobacco: Never Used  Substance and Sexual Activity  . Alcohol use: No  . Drug use: No  . Sexual activity: Not Currently  Lifestyle  . Physical activity:    Days per week: Not on file    Minutes per session: Not on file  . Stress: Not on file  Relationships  . Social connections:    Talks on phone: Not on file    Gets together: Not on file    Attends religious service: Not on file    Active member of club or organization: Not on file    Attends meetings of clubs or organizations: Not on file    Relationship status: Not on file  . Intimate partner violence:    Fear of current or ex partner: Not on file    Emotionally abused: Not on file    Physically abused: Not on file    Forced sexual activity: Not on file  Other Topics Concern  . Not on file  Social History Narrative  .  Not on file    Outpatient Medications Prior to Visit  Medication Sig Dispense Refill  . diclofenac sodium (VOLTAREN) 1 % GEL Apply 4 g topically 4 (four) times daily. (Patient not taking: Reported on 10/27/2018) 1 Tube 3  . famotidine (PEPCID) 20 MG tablet Take 1 tablet (20 mg total) by mouth 2 (two) times daily. (Patient not taking: Reported on 01/01/2019) 30 tablet 0  . methocarbamol (ROBAXIN) 500 MG tablet Take 1 tablet (500 mg total) by mouth 2 (two) times daily. (Patient not taking: Reported on 01/16/2019) 60 tablet 3  . ondansetron (ZOFRAN) 4 MG tablet Take 1 tablet (4 mg total) by mouth every 8 (eight) hours as needed for nausea or vomiting. (Patient not taking: Reported on 10/27/2018) 20 tablet 1  . vitamin C (ASCORBIC ACID) 500 MG tablet Take 1 tablet (500 mg total) by mouth daily. (Patient not taking: Reported on 10/27/2018) 30 tablet 11  . gabapentin  (NEURONTIN) 300 MG capsule Take 2 capsules 2 times a day as needed. (Patient not taking: Reported on 01/16/2019) 120 capsule 3   No facility-administered medications prior to visit.     Allergies  Allergen Reactions  . Lidocaine     Face swelling     ROS Review of Systems  Constitutional: Negative.   HENT: Negative.   Eyes: Negative.   Respiratory: Negative.   Cardiovascular: Negative.   Gastrointestinal: Negative.   Endocrine: Negative.   Genitourinary: Negative.   Musculoskeletal: Negative.   Skin: Negative.   Neurological: Positive for dizziness, numbness (pain/tingling in left arm and hand) and headaches.  Hematological: Negative.   Psychiatric/Behavioral: Positive for agitation. The patient is nervous/anxious.    Objective:    Physical Exam  Constitutional: She is oriented to person, place, and time. She appears well-developed and well-nourished.  HENT:  Head: Normocephalic and atraumatic.  Eyes: Conjunctivae are normal.  Neck: Normal range of motion. Neck supple.  Cardiovascular: Normal rate, regular rhythm, normal heart sounds and intact distal pulses.  Pulmonary/Chest: Effort normal and breath sounds normal.  Abdominal: Soft. Bowel sounds are normal.  Musculoskeletal: Normal range of motion.  Neurological: She is alert and oriented to person, place, and time. She has normal reflexes.  Skin: Skin is warm and dry.  Psychiatric: Judgment and thought content normal.  anxious  Nursing note and vitals reviewed.   BP 140/72 (BP Location: Left Arm, Patient Position: Sitting, Cuff Size: Small)   Pulse 60   Temp 98.2 F (36.8 C) (Oral)   Ht  (1.676 m)   Wt 163 lb (73.9 kg)   SpO2 96%   BMI 26.31 kg/m  Wt Readings from Last 3 Encounters:  01/16/19 163 lb (73.9 kg)  01/01/19 162 lb (73.5 kg)  12/26/18 158 lb (71.7 kg)     Health Maintenance Due  Topic Date Due  . COLONOSCOPY  03/11/2007    There are no preventive care reminders to display for this  patient.  Lab Results  Component Value Date   TSH 0.99 11/22/2016   Lab Results  Component Value Date   WBC 9.9 11/04/2018   HGB 13.0 11/04/2018   HCT 43.9 11/04/2018   MCV 85.9 11/04/2018   PLT 274 11/04/2018   Lab Results  Component Value Date   NA 136 11/04/2018   K 3.7 11/04/2018   CO2 26 11/04/2018   GLUCOSE 92 11/04/2018   BUN 9 11/04/2018   CREATININE 0.73 11/04/2018   BILITOT 1.2 11/04/2018   ALKPHOS 89 11/04/2018  AST 18 11/04/2018   ALT 17 11/04/2018   PROT 7.3 11/04/2018   ALBUMIN 4.0 11/04/2018   CALCIUM 9.1 11/04/2018   ANIONGAP 9 11/04/2018   Lab Results  Component Value Date   CHOL 200 05/24/2016   Lab Results  Component Value Date   HDL 56 05/24/2016   Lab Results  Component Value Date   LDLCALC 122 05/24/2016   Lab Results  Component Value Date   TRIG 109 05/24/2016   Lab Results  Component Value Date   CHOLHDL 3.6 05/24/2016   Lab Results  Component Value Date   HGBA1C 5.6 06/30/2018   Assessment & Plan:   1. Hospital discharge follow-up  2. Undifferentiated schizophrenia (HCC) Not on anti-psychotic medications at this time. She will continue to follow up with Psychiatrist as needed.   3. Left wrist pain She will reWe will .Motrin as needed/ - ibuprofen (ADVIL,MOTRIN) 800 MG tablet; Take 1 tablet (800 mg total) by mouth every 8 (eight) hours as needed.  Dispense: 30 tablet; Refill: 3 - gabapentin (NEURONTIN) 300 MG capsule; Take 2 capsules 2 times a day as needed.  Dispense: 120 capsule; Refill: 3  4. Carpal tunnel syndrome of left wrist - ibuprofen (ADVIL,MOTRIN) 800 MG tablet; Take 1 tablet (800 mg total) by mouth every 8 (eight) hours as needed.  Dispense: 30 tablet; Refill: 3 - gabapentin (NEURONTIN) 300 MG capsule; Take 2 capsules 2 times a day as needed.  Dispense: 120 capsule; Refill: 3  5. Neuropathy - ibuprofen (ADVIL,MOTRIN) 800 MG tablet; Take 1 tablet (800 mg total) by mouth every 8 (eight) hours as needed.   Dispense: 30 tablet; Refill: 3 - gabapentin (NEURONTIN) 300 MG capsule; Take 2 capsules 2 times a day as needed.  Dispense: 120 capsule; Refill: 3  6. Low back pain, unspecified back pain laterality, unspecified chronicity, unspecified whether sciatica present - gabapentin (NEURONTIN) 300 MG capsule; Take 2 capsules 2 times a day as needed.  Dispense: 120 capsule; Refill: 3  7. Chronic pain of left knee - gabapentin (NEURONTIN) 300 MG capsule; Take 2 capsules 2 times a day as needed.  Dispense: 120 capsule; Refill: 3  8. Follow up She will keep previously scheduled follow up appointment.  Meds ordered this encounter  Medications  . ibuprofen (ADVIL,MOTRIN) 800 MG tablet    Sig: Take 1 tablet (800 mg total) by mouth every 8 (eight) hours as needed.    Dispense:  30 tablet    Refill:  3  . gabapentin (NEURONTIN) 300 MG capsule    Sig: Take 2 capsules 2 times a day as needed.    Dispense:  120 capsule    Refill:  3   No orders of the defined types were placed in this encounter.   Referral Orders  No referral(s) requested today   Raliegh Ip,  MSN, FNP-C [facility] [address] [place], [place] [address] [phone]  Problem List Items Addressed This Visit      Nervous and Auditory   Neuropathy   Relevant Medications   ibuprofen (ADVIL,MOTRIN) 800 MG tablet   gabapentin (NEURONTIN) 300 MG capsule     Other   Chronic pain of left knee   Relevant Medications   ibuprofen (ADVIL,MOTRIN) 800 MG tablet   gabapentin (NEURONTIN) 300 MG capsule   Low back pain   Relevant Medications   ibuprofen (ADVIL,MOTRIN) 800 MG tablet   gabapentin (NEURONTIN) 300 MG capsule   Schizophrenia (HCC)  Other Visit Diagnoses    Hospital discharge follow-up    -  Primary   Left wrist pain       Relevant Medications   ibuprofen (ADVIL,MOTRIN) 800 MG tablet   gabapentin (NEURONTIN) 300 MG capsule   Carpal tunnel syndrome of left wrist        Relevant Medications   ibuprofen (ADVIL,MOTRIN) 800 MG tablet   gabapentin (NEURONTIN) 300 MG capsule   Follow up         Follow-up: No follow-ups on file.    Kallie Locks, FNP

## 2019-01-16 NOTE — Patient Instructions (Signed)
Ibuprofen tablets and capsules What is this medicine? IBUPROFEN (eye BYOO proe fen) is a non-steroidal anti-inflammatory drug (NSAID). It is used for dental pain, fever, headaches or migraines, osteoarthritis, rheumatoid arthritis, or painful monthly periods. It can also relieve minor aches and pains caused by a cold, flu, or sore throat. This medicine may be used for other purposes; ask your health care provider or pharmacist if you have questions. COMMON BRAND NAME(S): Advil, Advil Junior Strength, Advil Migraine, Genpril, Ibren, IBU, Midol, Midol Cramps and Body Aches, Motrin, Motrin IB, Motrin Junior Strength, Motrin Migraine Pain, Samson-8, Toxicology Saliva Collection What should I tell my health care provider before I take this medicine? They need to know if you have any of these conditions: -cigarette smoker -coronary artery bypass graft (CABG) surgery within the past 2 weeks -drink more than 3 alcohol-containing drinks a day -heart disease -high blood pressure -history of stomach bleeding -kidney disease -liver disease -lung or breathing disease, like asthma -an unusual or allergic reaction to ibuprofen, aspirin, other NSAIDs, other medicines, foods, dyes, or preservatives -pregnant or trying to get pregnant -breast-feeding How should I use this medicine? Take this medicine by mouth with a glass of water. Follow the directions on the prescription label. Take this medicine with food if your stomach gets upset. Try to not lie down for at least 10 minutes after you take the medicine. Take your medicine at regular intervals. Do not take your medicine more often than directed. A special MedGuide will be given to you by the pharmacist with each prescription and refill. Be sure to read this information carefully each time. Talk to your pediatrician regarding the use of this medicine in children. Special care may be needed. Overdosage: If you think you have taken too much of this medicine  contact a poison control center or emergency room at once. NOTE: This medicine is only for you. Do not share this medicine with others. What if I miss a dose? If you miss a dose, take it as soon as you can. If it is almost time for your next dose, take only that dose. Do not take double or extra doses. What may interact with this medicine? Do not take this medicine with any of the following medications: -cidofovir -ketorolac -methotrexate -pemetrexed This medicine may also interact with the following medications: -alcohol -aspirin -diuretics -lithium -other drugs for inflammation like prednisone -warfarin This list may not describe all possible interactions. Give your health care provider a list of all the medicines, herbs, non-prescription drugs, or dietary supplements you use. Also tell them if you smoke, drink alcohol, or use illegal drugs. Some items may interact with your medicine. What should I watch for while using this medicine? Tell your doctor or healthcare professional if your symptoms do not start to get better or if they get worse. This medicine does not prevent heart attack or stroke. In fact, this medicine may increase the chance of a heart attack or stroke. The chance may increase with longer use of this medicine and in people who have heart disease. If you take aspirin to prevent heart attack or stroke, talk with your doctor or health care professional. Do not take other medicines that contain aspirin, ibuprofen, or naproxen with this medicine. Side effects such as stomach upset, nausea, or ulcers may be more likely to occur. Many medicines available without a prescription should not be taken with this medicine. This medicine can cause ulcers and bleeding in the stomach and intestines at any time  during treatment. Ulcers and bleeding can happen without warning symptoms and can cause death. To reduce your risk, do not smoke cigarettes or drink alcohol while you are taking this  medicine. You may get drowsy or dizzy. Do not drive, use machinery, or do anything that needs mental alertness until you know how this medicine affects you. Do not stand or sit up quickly, especially if you are an older patient. This reduces the risk of dizzy or fainting spells. This medicine can cause you to bleed more easily. Try to avoid damage to your teeth and gums when you brush or floss your teeth. This medicine may be used to treat migraines. If you take migraine medicines for 10 or more days a month, your migraines may get worse. Keep a diary of headache days and medicine use. Contact your healthcare professional if your migraine attacks occur more frequently. What side effects may I notice from receiving this medicine? Side effects that you should report to your doctor or health care professional as soon as possible: -allergic reactions like skin rash, itching or hives, swelling of the face, lips, or tongue -severe stomach pain -signs and symptoms of bleeding such as bloody or black, tarry stools; red or dark-brown urine; spitting up blood or brown material that looks like coffee grounds; red spots on the skin; unusual bruising or bleeding from the eye, gums, or nose -signs and symptoms of a blood clot such as changes in vision; chest pain; severe, sudden headache; trouble speaking; sudden numbness or weakness of the face, arm, or leg -unexplained weight gain or swelling -unusually weak or tired -yellowing of eyes or skin Side effects that usually do not require medical attention (report to your doctor or health care professional if they continue or are bothersome): -bruising -diarrhea -dizziness, drowsiness -headache -nausea, vomiting This list may not describe all possible side effects. Call your doctor for medical advice about side effects. You may report side effects to FDA at 1-800-FDA-1088. Where should I keep my medicine? Keep out of the reach of children. Store at room  temperature between 15 and 30 degrees C (59 and 86 degrees F). Keep container tightly closed. Throw away any unused medicine after the expiration date. NOTE: This sheet is a summary. It may not cover all possible information. If you have questions about this medicine, talk to your doctor, pharmacist, or health care provider.  2019 Elsevier/Gold Standard (2017-07-06 12:43:57) Carpal Tunnel Syndrome  Carpal tunnel syndrome is a condition that causes pain in your hand and arm. The carpal tunnel is a narrow area that is on the palm side of your wrist. Repeated wrist motion or certain diseases may cause swelling in the tunnel. This swelling can pinch the main nerve in the wrist (median nerve). What are the causes? This condition may be caused by:  Repeated wrist motions.  Wrist injuries.  Arthritis.  A sac of fluid (cyst) or abnormal growth (tumor) in the carpal tunnel.  Fluid buildup during pregnancy. Sometimes the cause is not known. What increases the risk? The following factors may make you more likely to develop this condition:  Having a job in which you move your wrist in the same way many times. This includes jobs like being a Midwife or a Conservation officer, nature.  Being a woman.  Having other health conditions, such as: ? Diabetes. ? Obesity. ? A thyroid gland that is not active enough (hypothyroidism). ? Kidney failure. What are the signs or symptoms? Symptoms of this condition include:  A  tingling feeling in your fingers.  Tingling or a loss of feeling (numbness) in your hand.  Pain in your entire arm. This pain may get worse when you bend your wrist and elbow for a long time.  Pain in your wrist that goes up your arm to your shoulder.  Pain that goes down into your palm or fingers.  A weak feeling in your hands. You may find it hard to grab and hold items. You may feel worse at night. How is this diagnosed? This condition is diagnosed with a medical history and physical exam.  You may also have tests, such as:  Electromyogram (EMG). This test checks the signals that the nerves send to the muscles.  Nerve conduction study. This test checks how well signals pass through your nerves.  Imaging tests, such as X-rays, ultrasound, and MRI. These tests check for what might be the cause of your condition. How is this treated? This condition may be treated with:  Lifestyle changes. You will be asked to stop or change the activity that caused your problem.  Doing exercise and activities that make bones and muscles stronger (physical therapy).  Learning how to use your hand again (occupational therapy).  Medicines for pain and swelling (inflammation). You may have injections in your wrist.  A wrist splint.  Surgery. Follow these instructions at home: If you have a splint:  Wear the splint as told by your doctor. Remove it only as told by your doctor.  Loosen the splint if your fingers: ? Tingle. ? Lose feeling (become numb). ? Turn cold and blue.  Keep the splint clean.  If the splint is not waterproof: ? Do not let it get wet. ? Cover it with a watertight covering when you take a bath or a shower. Managing pain, stiffness, and swelling   If told, put ice on the painful area: ? If you have a removable splint, remove it as told by your doctor. ? Put ice in a plastic bag. ? Place a towel between your skin and the bag. ? Leave the ice on for 20 minutes, 2-3 times per day. General instructions  Take over-the-counter and prescription medicines only as told by your doctor.  Rest your wrist from any activity that may cause pain. If needed, talk with your boss at work about changes that can help your wrist heal.  Do any exercises as told by your doctor, physical therapist, or occupational therapist.  Keep all follow-up visits as told by your doctor. This is important. Contact a doctor if:  You have new symptoms.  Medicine does not help your  pain.  Your symptoms get worse. Get help right away if:  You have very bad numbness or tingling in your wrist or hand. Summary  Carpal tunnel syndrome is a condition that causes pain in your hand and arm.  It is often caused by repeated wrist motions.  Lifestyle changes and medicines are used to treat this problem. Surgery may help in very bad cases.  Follow your doctor's instructions about wearing a splint, resting your wrist, keeping follow-up visits, and calling for help. This information is not intended to replace advice given to you by your health care provider. Make sure you discuss any questions you have with your health care provider. Document Released: 10/21/2011 Document Revised: 03/10/2018 Document Reviewed: 03/10/2018 Elsevier Interactive Patient Education  2019 [employer] Gabapentin capsules or tablets What is this medicine? GABAPENTIN (GA ba pen tin) is used to control seizures in  certain types of epilepsy. It is also used to treat certain types of nerve pain. This medicine may be used for other purposes; ask your health care provider or pharmacist if you have questions. COMMON BRAND NAME(S): Active-PAC with Gabapentin, Gabarone, Neurontin What should I tell my health care provider before I take this medicine? They need to know if you have any of these conditions: -kidney disease -suicidal thoughts, plans, or attempt; a previous suicide attempt by you or a family member -an unusual or allergic reaction to gabapentin, other medicines, foods, dyes, or preservatives -pregnant or trying to get pregnant -breast-feeding How should I use this medicine? Take this medicine by mouth with a glass of water. Follow the directions on the prescription label. You can take it with or without food. If it upsets your stomach, take it with food. Take your medicine at regular intervals. Do not take it more often than directed. Do not stop taking except on your doctor's advice. If you are  directed to break the 600 or 800 mg tablets in half as part of your dose, the extra half tablet should be used for the next dose. If you have not used the extra half tablet within 28 days, it should be thrown away. A special MedGuide will be given to you by the pharmacist with each prescription and refill. Be sure to read this information carefully each time. Talk to your pediatrician regarding the use of this medicine in children. While this drug may be prescribed for children as young as 3 years for selected conditions, precautions do apply. Overdosage: If you think you have taken too much of this medicine contact a poison control center or emergency room at once. NOTE: This medicine is only for you. Do not share this medicine with others. What if I miss a dose? If you miss a dose, take it as soon as you can. If it is almost time for your next dose, take only that dose. Do not take double or extra doses. What may interact with this medicine? Do not take this medicine with any of the following medications: -other gabapentin products This medicine may also interact with the following medications: -alcohol -antacids -antihistamines for allergy, cough and cold -certain medicines for anxiety or sleep -certain medicines for depression or psychotic disturbances -homatropine; hydrocodone -naproxen -narcotic medicines (opiates) for pain -phenothiazines like chlorpromazine, mesoridazine, prochlorperazine, thioridazine This list may not describe all possible interactions. Give your health care provider a list of all the medicines, herbs, non-prescription drugs, or dietary supplements you use. Also tell them if you smoke, drink alcohol, or use illegal drugs. Some items may interact with your medicine. What should I watch for while using this medicine? Visit your doctor or health care professional for regular checks on your progress. You may want to keep a record at home of how you feel your condition is  responding to treatment. You may want to share this information with your doctor or health care professional at each visit. You should contact your doctor or health care professional if your seizures get worse or if you have any new types of seizures. Do not stop taking this medicine or any of your seizure medicines unless instructed by your doctor or health care professional. Stopping your medicine suddenly can increase your seizures or their severity. Wear a medical identification bracelet or chain if you are taking this medicine for seizures, and carry a card that lists all your medications. You may get drowsy, dizzy, or have blurred  vision. Do not drive, use machinery, or do anything that needs mental alertness until you know how this medicine affects you. To reduce dizzy or fainting spells, do not sit or stand up quickly, especially if you are an older patient. Alcohol can increase drowsiness and dizziness. Avoid alcoholic drinks. Your mouth may get dry. Chewing sugarless gum or sucking hard candy, and drinking plenty of water will help. The use of this medicine may increase the chance of suicidal thoughts or actions. Pay special attention to how you are responding while on this medicine. Any worsening of mood, or thoughts of suicide or dying should be reported to your health care professional right away. Women who become pregnant while using this medicine may enroll in the [place] Antiepileptic Drug Pregnancy Registry by calling [phone]. This registry collects information about the safety of antiepileptic drug use during pregnancy. What side effects may I notice from receiving this medicine? Side effects that you should report to your doctor or health care professional as soon as possible: -allergic reactions like skin rash, itching or hives, swelling of the face, lips, or tongue -worsening of mood, thoughts or actions of suicide or dying Side effects that usually do not require medical  attention (report to your doctor or health care professional if they continue or are bothersome): -constipation -difficulty walking or controlling muscle movements -dizziness -nausea -slurred speech -tiredness -tremors -weight gain This list may not describe all possible side effects. Call your doctor for medical advice about side effects. You may report side effects to FDA at 1-800-FDA-1088. Where should I keep my medicine? Keep out of reach of children. This medicine may cause accidental overdose and death if it taken by other adults, children, or pets. Mix any unused medicine with a substance like cat litter or coffee grounds. Then throw the medicine away in a sealed container like a sealed bag or a coffee can with a lid. Do not use the medicine after the expiration date. Store at room temperature between 15 and 30 degrees C (59 and 86 degrees F). NOTE: This sheet is a summary. It may not cover all possible information. If you have questions about this medicine, talk to your doctor, pharmacist, or health care provider.  2019 Elsevier/Gold Standard (2018-04-06 13:21:44) Gabapentin capsules or tablets What is this medicine? GABAPENTIN (GA ba pen tin) is used to control seizures in certain types of epilepsy. It is also used to treat certain types of nerve pain. This medicine may be used for other purposes; ask your health care provider or pharmacist if you have questions. COMMON BRAND NAME(S): Active-PAC with Gabapentin, Gabarone, Neurontin What should I tell my health care provider before I take this medicine? They need to know if you have any of these conditions: -kidney disease -suicidal thoughts, plans, or attempt; a previous suicide attempt by you or a family member -an unusual or allergic reaction to gabapentin, other medicines, foods, dyes, or preservatives -pregnant or trying to get pregnant -breast-feeding How should I use this medicine? Take this medicine by mouth with a glass  of water. Follow the directions on the prescription label. You can take it with or without food. If it upsets your stomach, take it with food. Take your medicine at regular intervals. Do not take it more often than directed. Do not stop taking except on your doctor's advice. If you are directed to break the 600 or 800 mg tablets in half as part of your dose, the extra half tablet should be used  for the next dose. If you have not used the extra half tablet within 28 days, it should be thrown away. A special MedGuide will be given to you by the pharmacist with each prescription and refill. Be sure to read this information carefully each time. Talk to your pediatrician regarding the use of this medicine in children. While this drug may be prescribed for children as young as 3 years for selected conditions, precautions do apply. Overdosage: If you think you have taken too much of this medicine contact a poison control center or emergency room at once. NOTE: This medicine is only for you. Do not share this medicine with others. What if I miss a dose? If you miss a dose, take it as soon as you can. If it is almost time for your next dose, take only that dose. Do not take double or extra doses. What may interact with this medicine? Do not take this medicine with any of the following medications: -other gabapentin products This medicine may also interact with the following medications: -alcohol -antacids -antihistamines for allergy, cough and cold -certain medicines for anxiety or sleep -certain medicines for depression or psychotic disturbances -homatropine; hydrocodone -naproxen -narcotic medicines (opiates) for pain -phenothiazines like chlorpromazine, mesoridazine, prochlorperazine, thioridazine This list may not describe all possible interactions. Give your health care provider a list of all the medicines, herbs, non-prescription drugs, or dietary supplements you use. Also tell them if you smoke,  drink alcohol, or use illegal drugs. Some items may interact with your medicine. What should I watch for while using this medicine? Visit your doctor or health care professional for regular checks on your progress. You may want to keep a record at home of how you feel your condition is responding to treatment. You may want to share this information with your doctor or health care professional at each visit. You should contact your doctor or health care professional if your seizures get worse or if you have any new types of seizures. Do not stop taking this medicine or any of your seizure medicines unless instructed by your doctor or health care professional. Stopping your medicine suddenly can increase your seizures or their severity. Wear a medical identification bracelet or chain if you are taking this medicine for seizures, and carry a card that lists all your medications. You may get drowsy, dizzy, or have blurred vision. Do not drive, use machinery, or do anything that needs mental alertness until you know how this medicine affects you. To reduce dizzy or fainting spells, do not sit or stand up quickly, especially if you are an older patient. Alcohol can increase drowsiness and dizziness. Avoid alcoholic drinks. Your mouth may get dry. Chewing sugarless gum or sucking hard candy, and drinking plenty of water will help. The use of this medicine may increase the chance of suicidal thoughts or actions. Pay special attention to how you are responding while on this medicine. Any worsening of mood, or thoughts of suicide or dying should be reported to your health care professional right away. Women who become pregnant while using this medicine may enroll in the [place] Antiepileptic Drug Pregnancy Registry by calling [phone]. This registry collects information about the safety of antiepileptic drug use during pregnancy. What side effects may I notice from receiving this medicine? Side effects  that you should report to your doctor or health care professional as soon as possible: -allergic reactions like skin rash, itching or hives, swelling of the face, lips, or tongue -worsening  of mood, thoughts or actions of suicide or dying Side effects that usually do not require medical attention (report to your doctor or health care professional if they continue or are bothersome): -constipation -difficulty walking or controlling muscle movements -dizziness -nausea -slurred speech -tiredness -tremors -weight gain This list may not describe all possible side effects. Call your doctor for medical advice about side effects. You may report side effects to FDA at 1-800-FDA-1088. Where should I keep my medicine? Keep out of reach of children. This medicine may cause accidental overdose and death if it taken by other adults, children, or pets. Mix any unused medicine with a substance like cat litter or coffee grounds. Then throw the medicine away in a sealed container like a sealed bag or a coffee can with a lid. Do not use the medicine after the expiration date. Store at room temperature between 15 and 30 degrees C (59 and 86 degrees F). NOTE: This sheet is a summary. It may not cover all possible information. If you have questions about this medicine, talk to your doctor, pharmacist, or health care provider.  2019 Elsevier/Gold Standard (2018-04-06 13:21:44)

## 2019-01-30 ENCOUNTER — Emergency Department (HOSPITAL_COMMUNITY)
Admission: EM | Admit: 2019-01-30 | Discharge: 2019-01-31 | Disposition: A | Discharge status: Home / Self Care | Payer: Medicaid Other | Attending: Emergency Medicine | Admitting: Emergency Medicine

## 2019-01-30 ENCOUNTER — Encounter (HOSPITAL_COMMUNITY): Payer: Self-pay

## 2019-01-30 ENCOUNTER — Other Ambulatory Visit: Payer: Self-pay

## 2019-01-30 DIAGNOSIS — Z79899 Other long term (current) drug therapy: Secondary | ICD-10-CM | POA: Insufficient documentation

## 2019-01-30 DIAGNOSIS — G629 Polyneuropathy, unspecified: Secondary | ICD-10-CM

## 2019-01-30 DIAGNOSIS — M545 Low back pain, unspecified: Secondary | ICD-10-CM

## 2019-01-30 DIAGNOSIS — M25562 Pain in left knee: Secondary | ICD-10-CM

## 2019-01-30 DIAGNOSIS — F209 Schizophrenia, unspecified: Secondary | ICD-10-CM | POA: Insufficient documentation

## 2019-01-30 DIAGNOSIS — G5602 Carpal tunnel syndrome, left upper limb: Secondary | ICD-10-CM

## 2019-01-30 DIAGNOSIS — G8929 Other chronic pain: Secondary | ICD-10-CM

## 2019-01-30 DIAGNOSIS — M25532 Pain in left wrist: Secondary | ICD-10-CM

## 2019-01-30 DIAGNOSIS — G6289 Other specified polyneuropathies: Secondary | ICD-10-CM | POA: Diagnosis not present

## 2019-01-30 DIAGNOSIS — M79643 Pain in unspecified hand: Secondary | ICD-10-CM | POA: Diagnosis present

## 2019-01-30 NOTE — ED Triage Notes (Signed)
Pt reports hand pain since September. She states that the pain is causing her feet and kneecaps to hurt. Ambulatory. Hx schizophrenia.

## 2019-01-31 MED ORDER — CAPSAICIN 0.075 % EX CREA: 1.0000 "application " | TOPICAL_CREAM | Freq: Two times a day (BID) | CUTANEOUS | 0 refills | Status: DC

## 2019-01-31 MED ORDER — GABAPENTIN 300 MG PO CAPS: ORAL_CAPSULE | ORAL | 3 refills | Status: DC

## 2019-01-31 NOTE — Discharge Instructions (Addendum)
Thank you for allowing me to care for you today in the Emergency Department.   Follow up with your primary care provider.   Take 1 tablet take 2 capsules of gabapentin 2 times daily as needed for the burning pain in your hands and feet.  Apply a thin film of capsaicin 3-4 times daily to help with pain.  Return to the emergency department if you develop high fever, if your joint gets red and hot to the touch, or if you have any fall or injury.

## 2019-01-31 NOTE — ED Provider Notes (Signed)
[facility]-EMERGENCY DEPT Provider Note   CSN: [id number] Arrival date & time: 01/30/19  2323    History   Chief Complaint Chief Complaint  Patient presents with  . Hand Pain    HPI Felicia Collier is a 63 y.o. female with a history of schizophrenia, avascular necrosis of the lunate, and Kienbock's disease presents to the emergency department with a chief complaint of "I've been having burning in my hand since August 12, 2018."  She has not been wearing her braces at home because they make her hands hurt. The patient reports burning and tingling and not only her left hand, but also her right hand, and bilateral feet for months.  No known aggravating or alleviating factors.  She states that she attempted to follow-up with her orthopedist after she had carpal tunnel surgery last year, but was told that her surgeon no longer worked there.  She is unsure of the name of her orthopedic surgeon.  She denies recent fevers, chills, chest pain, shortness of breath, dizziness, weakness, or headaches, redness, or swelling to the bilateral hands or feet.  No recent falls or injuries.     The history is provided by the patient. No language interpreter was used.    Past Medical History:  Diagnosis Date  . Chronic back pain   . Chronic knee pain   . Diverticulitis   . Schizophrenia [facility])     Patient Active Problem List   Diagnosis Date Noted  . Carpal tunnel syndrome of left wrist 01/16/2019  . Low back pain 01/01/2019  . Diarrhea 07/25/2018  . Pain in left wrist 07/20/2017  . Schizophrenia (HCC) 05/01/2017  . Chronic pain of left knee 02/09/2017  . Unilateral primary osteoarthritis, left knee 02/09/2017  . Neuropathy 11/22/2016  . Gastroesophageal reflux disease without esophagitis 11/22/2016  . Weight gain 11/22/2016  . Atypical chest pain 11/04/2016    Past Surgical History:  Procedure Laterality Date  . ABDOMINAL HYSTERECTOMY    . CHOLECYSTECTOMY    . KNEE  SURGERY Left   . WRIST SURGERY Left      OB History   No obstetric history on file.      Home Medications    Prior to Admission medications   Medication Sig Start Date End Date Taking? Authorizing Provider  capsicum (ZOSTRIX) 0.075 % topical cream Apply 1 application topically 2 (two) times daily. Apply a think film to affected areas 3-4 times daily. 01/31/19   Annisten Manchester A, PA-C  gabapentin (NEURONTIN) 300 MG capsule Take 2 capsules 2 times a day as needed. 01/31/19   Earlyn Sylvan A, PA-C  ibuprofen (ADVIL,MOTRIN) 800 MG tablet Take 1 tablet (800 mg total) by mouth every 8 (eight) hours as needed. 01/16/19   Kallie Locks, FNP    Family History Family History  Problem Relation Age of Onset  . Other Mother        no health conditions per patient  . Other Father        no health conditions per patient    Social History Social History   Tobacco Use  . Smoking status: Never Smoker  . Smokeless tobacco: Never Used  Substance Use Topics  . Alcohol use: No  . Drug use: No     Allergies   Lidocaine   Review of Systems Review of Systems  Constitutional: Negative for activity change, chills and fever.  Respiratory: Negative for shortness of breath.   Cardiovascular: Negative for chest pain.  Gastrointestinal: Negative for abdominal pain.  Musculoskeletal: Positive for arthralgias, joint swelling and myalgias. Negative for back pain, gait problem, neck pain and neck stiffness.  Skin: Negative for rash and wound.  Neurological: Negative for weakness and numbness.       Paresthesias     Physical Exam Updated Vital Signs BP (!) 167/73   Pulse 83   Temp 98.2 F (36.8 C) (Oral)   Resp 16   SpO2 98%   Physical Exam Vitals signs and nursing note reviewed.  Constitutional:      General: She is not in acute distress. HENT:     Head: Normocephalic.  Eyes:     Conjunctiva/sclera: Conjunctivae normal.  Neck:     Musculoskeletal: Neck supple.  Cardiovascular:      Rate and Rhythm: Normal rate and regular rhythm.     Heart sounds: No murmur. No friction rub. No gallop.   Pulmonary:     Effort: Pulmonary effort is normal. No respiratory distress.  Abdominal:     Palpations: Abdomen is soft.  Musculoskeletal:     Comments: Soft tissue swelling to the dorsum of the left wrist. Non-tender.  Full active and passive range of motion of the bilateral joints and elbows.  Radial pulses are 2+ and symmetric.  Full active and passive range of motion of all digits of the bilateral hands.  Sensation is intact and equal throughout.  No overlying redness or warmth.  There is a well-healed scar to the dorsum of the left hand.  Skin:    General: Skin is warm.     Findings: No rash.  Neurological:     Mental Status: She is alert.  Psychiatric:        Behavior: Behavior normal.      ED Treatments / Results  Labs (all labs ordered are listed, but only abnormal results are displayed) Labs Reviewed - No data to display  EKG None  Radiology No results found.  Procedures Procedures (including critical care time)  Medications Ordered in ED Medications - No data to display   Initial Impression / Assessment and Plan / ED Course  I have reviewed the triage vital signs and the nursing notes.  Pertinent labs & imaging results that were available during my care of the patient were reviewed by me and considered in my medical decision making (see chart for details).        63 year old female with a history of schizophrenia, avascular necrosis of the lunate, and Kienbock's disease setting with paresthesias to the bilateral hands and feet that is chronic.  No acute changes today.  She is describing a stocking glove distribution of peripheral neuropathy.  Per chart review, she has been evaluated in the ER and by primary care for the same symptoms a number of times and has previously had repeat images of the bilateral wrists.  She has stopped taking her home  gabapentin so I will re-prescribe this today as well as some capsaicin cream and encouraged her to follow-up with primary care or orthopedics.  At this time, low suspicion for CNS pathology, occult fracture.  She is hemodynamically stable and in no acute distress.  She is safe for discharge home with outpatient follow-up at this time.  Final Clinical Impressions(s) / ED Diagnoses   Final diagnoses:  Other polyneuropathy    ED Discharge Orders         Ordered    gabapentin (NEURONTIN) 300 MG capsule     01/31/19 0040  capsicum (ZOSTRIX) 0.075 % topical cream  2 times daily     01/31/19 0040           Analucia Hush, Coral Else, PA-C 01/31/19 0445    Palumbo, April, MD 01/31/19 8938

## 2019-01-31 NOTE — ED Notes (Signed)
Pt verbalized discharge instructions and follow up care. Alert and ambulatory  

## 2019-02-10 ENCOUNTER — Encounter (HOSPITAL_COMMUNITY): Payer: Self-pay | Admitting: *Deleted

## 2019-02-10 ENCOUNTER — Emergency Department (HOSPITAL_COMMUNITY)
Admission: EM | Admit: 2019-02-10 | Discharge: 2019-02-10 | Disposition: A | Discharge status: Home / Self Care | Payer: Medicaid Other | Attending: Emergency Medicine | Admitting: Emergency Medicine

## 2019-02-10 ENCOUNTER — Other Ambulatory Visit: Payer: Self-pay

## 2019-02-10 DIAGNOSIS — K2100 Gastro-esophageal reflux disease with esophagitis, without bleeding: Secondary | ICD-10-CM

## 2019-02-10 DIAGNOSIS — S8012XA Contusion of left lower leg, initial encounter: Secondary | ICD-10-CM | POA: Insufficient documentation

## 2019-02-10 DIAGNOSIS — R109 Unspecified abdominal pain: Secondary | ICD-10-CM | POA: Diagnosis present

## 2019-02-10 DIAGNOSIS — Z79899 Other long term (current) drug therapy: Secondary | ICD-10-CM | POA: Insufficient documentation

## 2019-02-10 DIAGNOSIS — W228XXA Striking against or struck by other objects, initial encounter: Secondary | ICD-10-CM | POA: Diagnosis not present

## 2019-02-10 DIAGNOSIS — Y939 Activity, unspecified: Secondary | ICD-10-CM | POA: Diagnosis not present

## 2019-02-10 DIAGNOSIS — Y929 Unspecified place or not applicable: Secondary | ICD-10-CM | POA: Insufficient documentation

## 2019-02-10 DIAGNOSIS — Y999 Unspecified external cause status: Secondary | ICD-10-CM | POA: Diagnosis not present

## 2019-02-10 MED ORDER — SUCRALFATE 1 G PO TABS
1.0000 g | ORAL_TABLET | Freq: Once | ORAL | Status: AC
  Administered 2019-02-10: 1 g via ORAL
  Filled 2019-02-10: qty 1

## 2019-02-10 MED ORDER — ALUM & MAG HYDROXIDE-SIMETH 200-200-20 MG/5ML PO SUSP
30.0000 mL | Freq: Once | ORAL | Status: AC
  Administered 2019-02-10: 30 mL via ORAL
  Filled 2019-02-10: qty 30

## 2019-02-10 MED ORDER — FAMOTIDINE 20 MG PO TABS
40.0000 mg | ORAL_TABLET | Freq: Once | ORAL | Status: AC
  Administered 2019-02-10: 40 mg via ORAL
  Filled 2019-02-10: qty 2

## 2019-02-10 MED ORDER — SUCRALFATE 1 G PO TABS: 1.0000 g | ORAL_TABLET | Freq: Four times a day (QID) | ORAL | 0 refills | Status: DC

## 2019-02-10 MED ORDER — HYOSCYAMINE SULFATE 0.125 MG SL SUBL
0.2500 mg | SUBLINGUAL_TABLET | Freq: Once | SUBLINGUAL | Status: AC
  Administered 2019-02-10: 0.25 mg via SUBLINGUAL
  Filled 2019-02-10: qty 2

## 2019-02-10 MED ORDER — FAMOTIDINE 20 MG PO TABS: 20.0000 mg | ORAL_TABLET | Freq: Two times a day (BID) | ORAL | 0 refills | Status: DC

## 2019-02-10 NOTE — ED Provider Notes (Signed)
[facility]-EMERGENCY DEPT Provider Note   CSN: [id number] Arrival date & time: 02/10/19  1705    History   Chief Complaint Chief Complaint  Patient presents with  . bruise on leg  . Shortness of Breath  . Cough  . Abdominal Pain    HPI Felicia Collier is a 63 y.o. female.     63 year old female presents with left thigh bruise after she struck it on an object several days ago.  Denies take any blood thinners at this time.  Nuys any other bruising.  States that it is changed colors and has been resolving.  Denies any DVT symptoms.  Patient has a secondary complaint of abdominal discomfort that is worse after she eats.  Denies any associated fever.  No emesis.  No diarrhea.  Has been passing flatus.  No urinary symptoms.  No treatment used for this prior to arrival.     Past Medical History:  Diagnosis Date  . Chronic back pain   . Chronic knee pain   . Diverticulitis   . Schizophrenia [facility])     Patient Active Problem List   Diagnosis Date Noted  . Carpal tunnel syndrome of left wrist 01/16/2019  . Low back pain 01/01/2019  . Diarrhea 07/25/2018  . Pain in left wrist 07/20/2017  . Schizophrenia (HCC) 05/01/2017  . Chronic pain of left knee 02/09/2017  . Unilateral primary osteoarthritis, left knee 02/09/2017  . Neuropathy 11/22/2016  . Gastroesophageal reflux disease without esophagitis 11/22/2016  . Weight gain 11/22/2016  . Atypical chest pain 11/04/2016    Past Surgical History:  Procedure Laterality Date  . ABDOMINAL HYSTERECTOMY    . CHOLECYSTECTOMY    . KNEE SURGERY Left   . WRIST SURGERY Left      OB History   No obstetric history on file.      Home Medications    Prior to Admission medications   Medication Sig Start Date End Date Taking? Authorizing Provider  capsicum (ZOSTRIX) 0.075 % topical cream Apply 1 application topically 2 (two) times daily. Apply a think film to affected areas 3-4 times daily. 01/31/19   McDonald,  Mia A, PA-C  gabapentin (NEURONTIN) 300 MG capsule Take 2 capsules 2 times a day as needed. 01/31/19   McDonald, Mia A, PA-C  ibuprofen (ADVIL,MOTRIN) 800 MG tablet Take 1 tablet (800 mg total) by mouth every 8 (eight) hours as needed. 01/16/19   Kallie Locks, FNP    Family History Family History  Problem Relation Age of Onset  . Other Mother        no health conditions per patient  . Other Father        no health conditions per patient    Social History Social History   Tobacco Use  . Smoking status: Never Smoker  . Smokeless tobacco: Never Used  Substance Use Topics  . Alcohol use: No  . Drug use: No     Allergies   Lidocaine   Review of Systems Review of Systems  All other systems reviewed and are negative.    Physical Exam Updated Vital Signs BP 138/78   Pulse 85   Temp 98.2 F (36.8 C) (Oral)   Resp 16   SpO2 98%   Physical Exam Vitals signs and nursing note reviewed.  Constitutional:      General: She is not in acute distress.    Appearance: Normal appearance. She is well-developed. She is not toxic-appearing.  HENT:  Head: Normocephalic and atraumatic.  Eyes:     General: Lids are normal.     Conjunctiva/sclera: Conjunctivae normal.     Pupils: Pupils are equal, round, and reactive to light.  Neck:     Musculoskeletal: Normal range of motion and neck supple.     Thyroid: No thyroid mass.     Trachea: No tracheal deviation.  Cardiovascular:     Rate and Rhythm: Normal rate and regular rhythm.     Heart sounds: Normal heart sounds. No murmur. No gallop.   Pulmonary:     Effort: Pulmonary effort is normal. No respiratory distress.     Breath sounds: Normal breath sounds. No stridor. No decreased breath sounds, wheezing, rhonchi or rales.  Abdominal:     General: Bowel sounds are normal. There is no distension.     Palpations: Abdomen is soft.     Tenderness: There is abdominal tenderness in the epigastric area. There is no guarding or  rebound.    Musculoskeletal: Normal range of motion.        General: No tenderness.       Legs:  Skin:    General: Skin is warm and dry.     Findings: No abrasion or rash.  Neurological:     Mental Status: She is alert and oriented to person, place, and time.     GCS: GCS eye subscore is 4. GCS verbal subscore is 5. GCS motor subscore is 6.     Cranial Nerves: No cranial nerve deficit.     Sensory: No sensory deficit.  Psychiatric:        Speech: Speech normal.        Behavior: Behavior normal.      ED Treatments / Results  Labs (all labs ordered are listed, but only abnormal results are displayed) Labs Reviewed - No data to display  EKG None  Radiology No results found.  Procedures Procedures (including critical care time)  Medications Ordered in ED Medications  alum & mag hydroxide-simeth (MAALOX/MYLANTA) 200-200-20 MG/5ML suspension 30 mL (has no administration in time range)  hyoscyamine (LEVSIN SL) SL tablet 0.25 mg (has no administration in time range)  famotidine (PEPCID) tablet 40 mg (has no administration in time range)  sucralfate (CARAFATE) tablet 1 g (has no administration in time range)     Initial Impression / Assessment and Plan / ED Course  I have reviewed the triage vital signs and the nursing notes.  Pertinent labs & imaging results that were available during my care of the patient were reviewed by me and considered in my medical decision making (see chart for details).        Patient is bruising her leg is likely posttraumatic.  She has no other findings concerning for systemic issues such as bruising to the rest of her trunk or extremities.  Patient given GI cocktail and Pepcid and feels much better.  Her abdominal exam is benign here.  She is afebrile.  No indication for labs or imaging at this time.  Return precautions given  Final Clinical Impressions(s) / ED Diagnoses   Final diagnoses:  None    ED Discharge Orders    None        Lorre Nick, MD 02/10/19 [phone]

## 2019-02-10 NOTE — ED Triage Notes (Signed)
Pt complains of bruise on left thigh for the past week. Pt also complains of cough, shortness of breath since yesterday. Pt also complains of abdominal pain for the past 2 days.

## 2019-02-14 ENCOUNTER — Encounter (HOSPITAL_COMMUNITY): Payer: Self-pay

## 2019-02-14 ENCOUNTER — Emergency Department (HOSPITAL_COMMUNITY): Payer: Medicaid Other

## 2019-02-14 ENCOUNTER — Emergency Department (HOSPITAL_COMMUNITY)
Admission: EM | Admit: 2019-02-14 | Discharge: 2019-02-14 | Disposition: A | Discharge status: Home / Self Care | Payer: Medicaid Other | Attending: Emergency Medicine | Admitting: Emergency Medicine

## 2019-02-14 ENCOUNTER — Other Ambulatory Visit: Payer: Self-pay

## 2019-02-14 DIAGNOSIS — M1712 Unilateral primary osteoarthritis, left knee: Secondary | ICD-10-CM

## 2019-02-14 DIAGNOSIS — Z79899 Other long term (current) drug therapy: Secondary | ICD-10-CM | POA: Diagnosis not present

## 2019-02-14 DIAGNOSIS — M25562 Pain in left knee: Secondary | ICD-10-CM

## 2019-02-14 MED ORDER — PREDNISONE 20 MG PO TABS
40.0000 mg | ORAL_TABLET | Freq: Once | ORAL | Status: AC
  Administered 2019-02-14: 40 mg via ORAL
  Filled 2019-02-14: qty 2

## 2019-02-14 MED ORDER — PREDNISONE 20 MG PO TABS: 20.0000 mg | ORAL_TABLET | Freq: Two times a day (BID) | ORAL | 0 refills | Status: DC

## 2019-02-14 NOTE — ED Provider Notes (Signed)
[facility]-EMERGENCY DEPT Provider Note   CSN: [id number] Arrival date & time: 02/14/19  2139    History   Chief Complaint Chief Complaint  Patient presents with  . Knee Pain    HPI Felicia Collier is a 63 y.o. female.     HPI   She presents for evaluation of left knee pain which she states has been present for 2 weeks, but worsened in the last 2 days.  She was seen in the ED,4 days ago, at that time she had a bruise of her left lateral thigh.  She denies trauma.  She denies fever, chills, nausea or vomiting.  There are no other known modifying factors.  Past Medical History:  Diagnosis Date  . Chronic back pain   . Chronic knee pain   . Diverticulitis   . Schizophrenia [facility])     Patient Active Problem List   Diagnosis Date Noted  . Carpal tunnel syndrome of left wrist 01/16/2019  . Low back pain 01/01/2019  . Diarrhea 07/25/2018  . Pain in left wrist 07/20/2017  . Schizophrenia (HCC) 05/01/2017  . Chronic pain of left knee 02/09/2017  . Unilateral primary osteoarthritis, left knee 02/09/2017  . Neuropathy 11/22/2016  . Gastroesophageal reflux disease without esophagitis 11/22/2016  . Weight gain 11/22/2016  . Atypical chest pain 11/04/2016    Past Surgical History:  Procedure Laterality Date  . ABDOMINAL HYSTERECTOMY    . CHOLECYSTECTOMY    . KNEE SURGERY Left   . WRIST SURGERY Left      OB History   No obstetric history on file.      Home Medications    Prior to Admission medications   Medication Sig Start Date End Date Taking? Authorizing Provider  capsicum (ZOSTRIX) 0.075 % topical cream Apply 1 application topically 2 (two) times daily. Apply a think film to affected areas 3-4 times daily. 01/31/19   McDonald, Mia A, PA-C  famotidine (PEPCID) 20 MG tablet Take 1 tablet (20 mg total) by mouth 2 (two) times daily. 02/10/19   Lorre Nick, MD  gabapentin (NEURONTIN) 300 MG capsule Take 2 capsules 2 times a day as needed. Patient  taking differently: Take 600 mg by mouth 2 (two) times daily. Take 2 capsules 2 times a day as needed. 01/31/19   McDonald, Mia A, PA-C  ibuprofen (ADVIL,MOTRIN) 800 MG tablet Take 1 tablet (800 mg total) by mouth every 8 (eight) hours as needed. 01/16/19   Kallie Locks, FNP  predniSONE (DELTASONE) 20 MG tablet Take 1 tablet (20 mg total) by mouth 2 (two) times daily. 02/14/19   Mancel Bale, MD  sucralfate (CARAFATE) 1 g tablet Take 1 tablet (1 g total) by mouth 4 (four) times daily. 02/10/19   Lorre Nick, MD    Family History Family History  Problem Relation Age of Onset  . Other Mother        no health conditions per patient  . Other Father        no health conditions per patient    Social History Social History   Tobacco Use  . Smoking status: Never Smoker  . Smokeless tobacco: Never Used  Substance Use Topics  . Alcohol use: No  . Drug use: No     Allergies   Lidocaine   Review of Systems Review of Systems  All other systems reviewed and are negative.    Physical Exam Updated Vital Signs Ht  (1.651 m)   Wt 73.5 kg  BMI 26.96 kg/m   Physical Exam Vitals signs and nursing note reviewed.  Constitutional:      Appearance: She is well-developed.  HENT:     Head: Normocephalic and atraumatic.     Right Ear: External ear normal.     Left Ear: External ear normal.  Eyes:     Conjunctiva/sclera: Conjunctivae normal.     Pupils: Pupils are equal, round, and reactive to light.  Neck:     Musculoskeletal: Normal range of motion and neck supple.     Trachea: Phonation normal.  Cardiovascular:     Rate and Rhythm: Normal rate.  Pulmonary:     Effort: Pulmonary effort is normal.  Musculoskeletal:     Comments: Decreased motion left knee flexion extension secondary to pain.  Moderate left knee effusion.  Left knee grossly stable.  Small flap bruise, about 15 cm above the left lateral knee.  No associated bleeding, drainage or fluctuance.  Neurovascular  intact distally in the left foot.  Skin:    General: Skin is warm and dry.  Neurological:     Mental Status: She is alert and oriented to person, place, and time.     Cranial Nerves: No cranial nerve deficit.     Sensory: No sensory deficit.     Motor: No abnormal muscle tone.     Coordination: Coordination normal.  Psychiatric:        Behavior: Behavior normal.        Thought Content: Thought content normal.        Judgment: Judgment normal.      ED Treatments / Results  Labs (all labs ordered are listed, but only abnormal results are displayed) Labs Reviewed - No data to display  EKG None  Radiology Dg Knee Complete 4 Views Left  Result Date: 02/14/2019 CLINICAL DATA:  63 year old female with left knee pain. EXAM: LEFT KNEE - COMPLETE 4+ VIEW COMPARISON:  Left knee radiograph dated 06/03/2018 FINDINGS: There is no acute fracture or dislocation. The bones are osteopenic. There is moderate to severe osteoarthritic changes with tricompartmental narrowing most severe involving the medial and patellofemoral compartments. Chronic osteochondral lesion of the medial femoral condyle noted. No significant joint effusion. The soft tissues are unremarkable. IMPRESSION: 1. No acute fracture or dislocation. 2. Osteoarthritic changes similar to prior radiograph. Electronically Signed   By: Elgie Collard M.D.   On: 02/14/2019 22:33    Procedures Procedures (including critical care time)  Medications Ordered in ED Medications  predniSONE (DELTASONE) tablet 40 mg (has no administration in time range)     Initial Impression / Assessment and Plan / ED Course  I have reviewed the triage vital signs and the nursing notes.  Pertinent labs & imaging results that were available during my care of the patient were reviewed by me and considered in my medical decision making (see chart for details).         Patient Vitals for the past 24 hrs:  Height Weight  02/14/19 2147 5\' 5"  (1.651 m)  73.5 kg    11:16 PM Reevaluation with update and discussion. After initial assessment and treatment, an updated evaluation reveals no change in clinical status.  Knee sleeve on, after ordered.  Findings discussed and questions answered. Mancel Bale   Medical Decision Making: Knee pain with swelling and effusion secondary to arthritis.  Doubt fracture, DVT, lumbar radiculopathy.  CRITICAL CARE-no Performed by: Mancel Bale  Nursing Notes Reviewed/ Care Coordinated Applicable Imaging Reviewed Interpretation of Laboratory Data incorporated  into ED treatment  The patient appears reasonably screened and/or stabilized for discharge and I doubt any other medical condition or other [facility] requiring further screening, evaluation, or treatment in the ED at this time prior to discharge.  Plan: Home Medications-continue usual; Home Treatments-knee brace, heat therapy; return here if the recommended treatment, does not improve the symptoms; Recommended follow up-PCP follow-up PRN.   Final Clinical Impressions(s) / ED Diagnoses   Final diagnoses:  Left knee pain, unspecified chronicity  Osteoarthritis of left knee, unspecified osteoarthritis type    ED Discharge Orders         Ordered    predniSONE (DELTASONE) 20 MG tablet  2 times daily     02/14/19 2320           Mancel Bale, MD 02/14/19 2321

## 2019-02-14 NOTE — ED Triage Notes (Addendum)
Pt arrived stating her kneecap hurts, pt states she had knee surgery (doesn't know what kind) a few years ago and it has been hurting since. Pt states she took some pain medication, unable to tell this nurse what kind it was. Pt ambulatory in triage.

## 2019-02-14 NOTE — Discharge Instructions (Signed)
Use the knee sleeve, as needed for comfort.  Take the prednisone prescription as prescribed.  Use Tylenol 650 mg every 4 hours as needed for pain.

## 2019-03-01 ENCOUNTER — Other Ambulatory Visit: Payer: Self-pay

## 2019-03-01 ENCOUNTER — Emergency Department (HOSPITAL_COMMUNITY)
Admission: EM | Admit: 2019-03-01 | Discharge: 2019-03-01 | Disposition: A | Discharge status: Home / Self Care | Payer: Medicaid Other | Attending: Emergency Medicine | Admitting: Emergency Medicine

## 2019-03-01 ENCOUNTER — Encounter (HOSPITAL_COMMUNITY): Payer: Self-pay

## 2019-03-01 DIAGNOSIS — K219 Gastro-esophageal reflux disease without esophagitis: Secondary | ICD-10-CM | POA: Diagnosis not present

## 2019-03-01 DIAGNOSIS — M2550 Pain in unspecified joint: Secondary | ICD-10-CM

## 2019-03-01 DIAGNOSIS — G8929 Other chronic pain: Secondary | ICD-10-CM | POA: Diagnosis not present

## 2019-03-01 DIAGNOSIS — M25549 Pain in joints of unspecified hand: Principal | ICD-10-CM

## 2019-03-01 DIAGNOSIS — M25561 Pain in right knee: Secondary | ICD-10-CM | POA: Insufficient documentation

## 2019-03-01 DIAGNOSIS — Z79899 Other long term (current) drug therapy: Secondary | ICD-10-CM | POA: Insufficient documentation

## 2019-03-01 DIAGNOSIS — M25542 Pain in joints of left hand: Secondary | ICD-10-CM | POA: Diagnosis not present

## 2019-03-01 DIAGNOSIS — M542 Cervicalgia: Secondary | ICD-10-CM | POA: Diagnosis not present

## 2019-03-01 DIAGNOSIS — M25541 Pain in joints of right hand: Secondary | ICD-10-CM | POA: Diagnosis not present

## 2019-03-01 DIAGNOSIS — M25562 Pain in left knee: Secondary | ICD-10-CM | POA: Diagnosis not present

## 2019-03-01 DIAGNOSIS — M25569 Pain in unspecified knee: Secondary | ICD-10-CM

## 2019-03-01 MED ORDER — FAMOTIDINE 20 MG PO TABS
40.0000 mg | ORAL_TABLET | Freq: Once | ORAL | Status: AC
  Administered 2019-03-01: 40 mg via ORAL
  Filled 2019-03-01: qty 2

## 2019-03-01 MED ORDER — OMEPRAZOLE 20 MG PO CPDR: DELAYED_RELEASE_CAPSULE | ORAL | 0 refills | Status: DC

## 2019-03-01 MED ORDER — SUCRALFATE 1 G PO TABS
1.0000 g | ORAL_TABLET | Freq: Once | ORAL | Status: AC
  Administered 2019-03-01: 1 g via ORAL
  Filled 2019-03-01: qty 1

## 2019-03-01 MED ORDER — MELOXICAM 15 MG PO TABS: ORAL_TABLET | ORAL | 0 refills | Status: DC

## 2019-03-01 MED ORDER — SUCRALFATE 1 G PO TABS: 1.0000 g | ORAL_TABLET | Freq: Four times a day (QID) | ORAL | 0 refills | Status: DC

## 2019-03-01 NOTE — ED Triage Notes (Signed)
Pt reports continued pain in her hands and L knee. Also reports that her GERD is acting up. She states, "everyone else is getting cured, I want to be cured." She reports that he has completed the medications prescribed to her earlier this month.

## 2019-03-01 NOTE — ED Provider Notes (Signed)
[facility]-EMERGENCY DEPT Provider Note: Lowella Dell, MD, FACEP  CSN: [id number] MRN: [id number] ARRIVAL: 03/01/19 at 0436 ROOM: WA21/WA21   CHIEF COMPLAINT  Hand Pain   HISTORY OF PRESENT ILLNESS  03/01/19 4:49 AM Felicia Collier is a 63 y.o. female who complains of persistent pain ("10/10") in her hands, knees and neck.  She states these pains have been present since at least September of last year.  Pain is worse with movement.  She has been on gabapentin and 800 mg ibuprofen in the past but states these just make her sleepy and feel hot.  She no longer wishes to take these.  She was recently placed on prednisone and states that did not help either.  She is requesting a definitive cure.  She is also complaining of her GERD acting up.  She had previously been prescribed famotidine and Carafate but is out of these.   Past Medical History:  Diagnosis Date  . Chronic back pain   . Chronic knee pain   . Diverticulitis   . Schizophrenia [facility])     Past Surgical History:  Procedure Laterality Date  . ABDOMINAL HYSTERECTOMY    . CHOLECYSTECTOMY    . KNEE SURGERY Left   . WRIST SURGERY Left     Family History  Problem Relation Age of Onset  . Other Mother        no health conditions per patient  . Other Father        no health conditions per patient    Social History   Tobacco Use  . Smoking status: Never Smoker  . Smokeless tobacco: Never Used  Substance Use Topics  . Alcohol use: No  . Drug use: No    Prior to Admission medications   Medication Sig Start Date End Date Taking? Authorizing Provider  capsicum (ZOSTRIX) 0.075 % topical cream Apply 1 application topically 2 (two) times daily. Apply a think film to affected areas 3-4 times daily. 01/31/19   McDonald, Mia A, PA-C  famotidine (PEPCID) 20 MG tablet Take 1 tablet (20 mg total) by mouth 2 (two) times daily. 02/10/19   Lorre Nick, MD  gabapentin (NEURONTIN) 300 MG capsule Take 2 capsules 2 times a day as needed.  Patient taking differently: Take 600 mg by mouth 2 (two) times daily. Take 2 capsules 2 times a day as needed. 01/31/19   McDonald, Mia A, PA-C  ibuprofen (ADVIL,MOTRIN) 800 MG tablet Take 1 tablet (800 mg total) by mouth every 8 (eight) hours as needed. 01/16/19   Kallie Locks, FNP  predniSONE (DELTASONE) 20 MG tablet Take 1 tablet (20 mg total) by mouth 2 (two) times daily. 02/14/19   Mancel Bale, MD  sucralfate (CARAFATE) 1 g tablet Take 1 tablet (1 g total) by mouth 4 (four) times daily. 02/10/19   Lorre Nick, MD    Allergies Lidocaine   REVIEW OF SYSTEMS  Negative except as noted here or in the History of Present Illness.   PHYSICAL EXAMINATION  Initial Vital Signs Blood pressure (!) 143/66, pulse 81, temperature 97.9 F (36.6 C), temperature source Oral, resp. rate 18, height 5\' 6"  (1.676 m), weight 77.1 kg, SpO2 98 %.  Examination General: Well-developed, well-nourished female in no acute distress; appearance consistent with age of record HENT: normocephalic; atraumatic Eyes: Normal appearance Neck: supple Heart: regular rate and rhythm Lungs: clear to auscultation bilaterally Abdomen: soft; nondistended Extremities: Chronic appearing changes of knee and wrist joints with mild edema of fingers Neurologic: Awake,  alert; motor function intact in all extremities and symmetric; no facial droop Skin: Warm and dry Psychiatric: Normal mood and affect   RESULTS  Summary of this visit's results, reviewed by myself:   EKG Interpretation  Date/Time:    Ventricular Rate:    PR Interval:    QRS Duration:   QT Interval:    QTC Calculation:   R Axis:     Text Interpretation:        Laboratory Studies: No results found for this or any previous visit (from the past 24 hour(s)). Imaging Studies: No results found.  ED COURSE and MDM  Nursing notes and initial vitals signs, including pulse oximetry, reviewed.  Vitals:   03/01/19 0442 03/01/19 0453  BP: (!) 143/66    Pulse: 81   Resp: 18   Temp: 97.9 F (36.6 C)   TempSrc: Oral   SpO2: 98% 99%  Weight: 77.1 kg   Height: 5\' 6"  (1.676 m)    We will place the patient on omeprazole's famotidine is now on back order following the recall of ranitidine.  She had been on ibuprofen 800 mg 3 times a day and this may have been exacerbating her GERD as well.  We will switch to Mobic which should be easier on her stomach.  The patient was advised that we cannot provide a definitive cure for her chronic pain.  PROCEDURES    ED DIAGNOSES     ICD-10-CM   1. Chronic joint pain M25.50    G89.29   2. Gastroesophageal reflux disease, esophagitis presence not specified K21.9        Brien Lowe, MD 03/01/19 [phone]

## 2019-03-05 ENCOUNTER — Other Ambulatory Visit: Payer: Self-pay

## 2019-03-05 ENCOUNTER — Encounter (HOSPITAL_COMMUNITY): Payer: Self-pay

## 2019-03-05 ENCOUNTER — Emergency Department (HOSPITAL_COMMUNITY)
Admission: EM | Admit: 2019-03-05 | Discharge: 2019-03-05 | Disposition: A | Discharge status: Home / Self Care | Payer: Medicaid Other | Attending: Emergency Medicine | Admitting: Emergency Medicine

## 2019-03-05 DIAGNOSIS — Z79899 Other long term (current) drug therapy: Secondary | ICD-10-CM | POA: Insufficient documentation

## 2019-03-05 DIAGNOSIS — F209 Schizophrenia, unspecified: Secondary | ICD-10-CM | POA: Insufficient documentation

## 2019-03-05 DIAGNOSIS — G8929 Other chronic pain: Secondary | ICD-10-CM | POA: Diagnosis not present

## 2019-03-05 DIAGNOSIS — M2550 Pain in unspecified joint: Secondary | ICD-10-CM | POA: Diagnosis present

## 2019-03-05 DIAGNOSIS — M7918 Myalgia, other site: Secondary | ICD-10-CM | POA: Insufficient documentation

## 2019-03-05 NOTE — ED Triage Notes (Signed)
Pt reports generalized pain. She states that her hands and feet are burning. She also states that her neck, back, and chest are hurting as well. Pt endorses every symptom that she is asked about. No obvious signs of distress or respiratory symptoms. She also states that she is here to find a doctor because she cant find one and her old PCP is closed. She presents with multiple pages of resources from last visit.

## 2019-03-05 NOTE — ED Provider Notes (Signed)
[facility]-EMERGENCY DEPT Provider Note   CSN: [id number] Arrival date & time: 03/05/19  0108    History   Chief Complaint Chief Complaint  Patient presents with  . Pain    HPI Felicia Collier is a 63 y.o. female.     The history is provided by the patient.   Patient presents for multiple complaints.  She reports pain in her knees, hands, wrists, neck.  She is not exactly sure when her pain began.  She denies any recent falls.  She has been taking medications without relief.  She is tried again to see a PCP. No fevers are reported. Past Medical History:  Diagnosis Date  . Chronic back pain   . Chronic knee pain   . Diverticulitis   . Schizophrenia [facility])     Patient Active Problem List   Diagnosis Date Noted  . Carpal tunnel syndrome of left wrist 01/16/2019  . Low back pain 01/01/2019  . Diarrhea 07/25/2018  . Pain in left wrist 07/20/2017  . Schizophrenia (HCC) 05/01/2017  . Chronic pain of left knee 02/09/2017  . Unilateral primary osteoarthritis, left knee 02/09/2017  . Neuropathy 11/22/2016  . Gastroesophageal reflux disease without esophagitis 11/22/2016  . Weight gain 11/22/2016  . Atypical chest pain 11/04/2016    Past Surgical History:  Procedure Laterality Date  . ABDOMINAL HYSTERECTOMY    . CHOLECYSTECTOMY    . KNEE SURGERY Left   . WRIST SURGERY Left      OB History   No obstetric history on file.      Home Medications    Prior to Admission medications   Medication Sig Start Date End Date Taking? Authorizing Provider  capsicum (ZOSTRIX) 0.075 % topical cream Apply 1 application topically 2 (two) times daily. Apply a think film to affected areas 3-4 times daily. 01/31/19   McDonald, Mia A, PA-C  meloxicam (MOBIC) 15 MG tablet Take 1 tablet daily as needed for pain. 03/01/19   Molpus, John, MD  omeprazole (PRILOSEC) 20 MG capsule Take 1 capsule every morning at least 30 minutes before first dose of Carafate. 03/01/19    Molpus, John, MD  sucralfate (CARAFATE) 1 g tablet Take 1 tablet (1 g total) by mouth 4 (four) times daily. 03/01/19   Molpus, John, MD    Family History Family History  Problem Relation Age of Onset  . Other Mother        no health conditions per patient  . Other Father        no health conditions per patient    Social History Social History   Tobacco Use  . Smoking status: Never Smoker  . Smokeless tobacco: Never Used  Substance Use Topics  . Alcohol use: No  . Drug use: No     Allergies   Lidocaine   Review of Systems Review of Systems  Constitutional: Negative for fever.  Musculoskeletal: Positive for arthralgias and myalgias.     Physical Exam Updated Vital Signs BP (!) 150/85 (BP Location: Left Arm)   Pulse 71   Temp (!) 97.5 F (36.4 C) (Oral)   Resp 16   Ht 1.676 m (5\' 6" )   Wt 77.1 kg   SpO2 97%   BMI 27.44 kg/m   Physical Exam CONSTITUTIONAL: Elderly, no acute distress HEAD: Normocephalic/atraumatic EYES: EOMI ENMT: Mucous membranes moist NECK: supple no meningeal signs SPINE/BACK:entire spine nontender LUNGS: no apparent distress ABDOMEN: soft, nontender NEURO: Pt is awake/alert/appropriate, moves all extremitiesx4.  No  facial droop.   EXTREMITIES: pulses normal/equal, full ROM, mild swelling to left wrist without erythema or signs of trauma No deformities noted to either knee. All other extremities/joints palpated/ranged and nontender SKIN: warm, color normal PSYCH: flat affect   ED Treatments / Results  Labs (all labs ordered are listed, but only abnormal results are displayed) Labs Reviewed - No data to display  EKG None  Radiology No results found.  Procedures Procedures  Medications Ordered in ED Medications - No data to display   Initial Impression / Assessment and Plan / ED Course  I have reviewed the triage vital signs and the nursing notes.  Patient presents with multiple pain complaints.  When asked to pinpoint  her main pain she points to her left knee.  There is no signs of any acute trauma, no effusion noted.  She has full range of motion of her knees. She did not endorse chest pain on my exam. This appears to be chronic Referred to PCP  Final Clinical Impressions(s) / ED Diagnoses   Final diagnoses:  Other chronic pain    ED Discharge Orders    None       Zadie RhineWickline, Umaima Scholten, MD 03/05/19 [phone]

## 2019-03-07 ENCOUNTER — Other Ambulatory Visit: Payer: Self-pay

## 2019-03-07 ENCOUNTER — Encounter: Payer: Self-pay | Admitting: Family Medicine

## 2019-03-07 ENCOUNTER — Ambulatory Visit (INDEPENDENT_AMBULATORY_CARE_PROVIDER_SITE_OTHER): Payer: Medicaid Other | Admitting: Family Medicine

## 2019-03-07 VITALS — BP 132/78 | HR 80 | Temp 98.1°F | Ht 66.0 in | Wt 163.6 lb

## 2019-03-07 DIAGNOSIS — R300 Dysuria: Secondary | ICD-10-CM

## 2019-03-07 DIAGNOSIS — M25562 Pain in left knee: Secondary | ICD-10-CM

## 2019-03-07 DIAGNOSIS — M545 Low back pain, unspecified: Secondary | ICD-10-CM

## 2019-03-07 DIAGNOSIS — Z09 Encounter for follow-up examination after completed treatment for conditions other than malignant neoplasm: Secondary | ICD-10-CM

## 2019-03-07 DIAGNOSIS — R1084 Generalized abdominal pain: Secondary | ICD-10-CM

## 2019-03-07 DIAGNOSIS — M25532 Pain in left wrist: Secondary | ICD-10-CM | POA: Diagnosis not present

## 2019-03-07 DIAGNOSIS — G8929 Other chronic pain: Secondary | ICD-10-CM

## 2019-03-07 DIAGNOSIS — N390 Urinary tract infection, site not specified: Secondary | ICD-10-CM

## 2019-03-07 DIAGNOSIS — R319 Hematuria, unspecified: Secondary | ICD-10-CM

## 2019-03-07 DIAGNOSIS — R197 Diarrhea, unspecified: Secondary | ICD-10-CM

## 2019-03-07 LAB — POCT URINALYSIS DIP (MANUAL ENTRY)
Bilirubin, UA: NEGATIVE
Glucose, UA: NEGATIVE mg/dL
Ketones, POC UA: NEGATIVE mg/dL
Nitrite, UA: NEGATIVE
Spec Grav, UA: 1.02 (ref 1.010–1.025)
Urobilinogen, UA: 0.2 E.U./dL
pH, UA: 7 (ref 5.0–8.0)

## 2019-03-07 MED ORDER — TIZANIDINE HCL 4 MG PO CAPS: 4.0000 mg | ORAL_CAPSULE | Freq: Two times a day (BID) | ORAL | 2 refills | Status: DC | PRN

## 2019-03-07 MED ORDER — SULFAMETHOXAZOLE-TRIMETHOPRIM 800-160 MG PO TABS: 1.0000 | ORAL_TABLET | Freq: Two times a day (BID) | ORAL | 0 refills | Status: AC

## 2019-03-07 NOTE — Progress Notes (Signed)
[facility]  Hospital Follow Up--Sick Visit  Subjective:  Patient ID: Felicia Collier, female    DOB: September 14, 1956  Age: 63 y.o. MRN: [id number]  CC:  Chief Complaint  Patient presents with  . Neck Pain  . Abdominal Pain  . Cough    HPI Felicia Collier is a 63 year old female who presents for Sick Visit today.   Past Medical History:  Diagnosis Date  . Abdominal pain   . Chronic back pain   . Chronic knee pain   . Diverticulitis   . Schizophrenia (HCC)    Current Status: Since her last office visit, she is doing well with no complaints. Today, she has c/o left lower quadrant abdominal pain X 2 months now. She has occasional diarrhea. She reports pain with urination. She denies urinary frequency, discharge, urinary itching, burning, odor, hematuria, and suprapubic pain/discomfort. No reports of GI problems such as nausea, vomiting, and constipation. She has no reports of blood in stools, dysuria and hematuria. She does have a history of Diverticulitis. She also has c/o neck and back pain, which she states that she does not like the way the pain medication makes her feel. She currently, is not taking any medications. She is moderately agitated today, with description of pain and discomfort. She states that Seroquel makes her too drowsy, so she does not like to take this medication. She denies suicidal ideations, homicidal ideations, or auditory hallucinations. She currently continues to follow up with her Psychiatrist every month, at [facility] on 03/23/2019.   She denies fevers, chills, fatigue, recent infections, weight loss, and night sweats. She has not had any headaches, visual changes, dizziness, and falls. No chest pain, heart palpitations, and cough reported.   Past Surgical History:  Procedure Laterality Date  . ABDOMINAL HYSTERECTOMY    . CHOLECYSTECTOMY    . KNEE SURGERY Left   . WRIST SURGERY Left     Family History  Problem  Relation Age of Onset  . Other Mother        no health conditions per patient  . Other Father        no health conditions per patient    Social History   Socioeconomic History  . Marital status: Single    Spouse name: Not on file  . Number of children: Not on file  . Years of education: Not on file  . Highest education level: Not on file  Occupational History  . Not on file  Social Needs  . Financial resource strain: Not on file  . Food insecurity:    Worry: Not on file    Inability: Not on file  . Transportation needs:    Medical: Not on file    Non-medical: Not on file  Tobacco Use  . Smoking status: Never Smoker  . Smokeless tobacco: Never Used  Substance and Sexual Activity  . Alcohol use: No  . Drug use: No  . Sexual activity: Not Currently  Lifestyle  . Physical activity:    Days per week: Not on file    Minutes per session: Not on file  . Stress: Not on file  Relationships  . Social connections:    Talks on phone: Not on file    Gets together: Not on file    Attends religious service: Not on file    Active member of club or organization: Not on file    Attends meetings of clubs or organizations: Not  on file    Relationship status: Not on file  . Intimate partner violence:    Fear of current or ex partner: Not on file    Emotionally abused: Not on file    Physically abused: Not on file    Forced sexual activity: Not on file  Other Topics Concern  . Not on file  Social History Narrative  . Not on file    Outpatient Medications Prior to Visit  Medication Sig Dispense Refill  . capsicum (ZOSTRIX) 0.075 % topical cream Apply 1 application topically 2 (two) times daily. Apply a think film to affected areas 3-4 times daily. (Patient not taking: Reported on 03/07/2019) 28.3 g 0  . meloxicam (MOBIC) 15 MG tablet Take 1 tablet daily as needed for pain. (Patient not taking: Reported on 03/07/2019) 30 tablet 0  . omeprazole (PRILOSEC) 20 MG capsule Take 1 capsule  every morning at least 30 minutes before first dose of Carafate. (Patient not taking: Reported on 03/07/2019) 30 capsule 0  . sucralfate (CARAFATE) 1 g tablet Take 1 tablet (1 g total) by mouth 4 (four) times daily. (Patient not taking: Reported on 03/07/2019) 40 tablet 0   No facility-administered medications prior to visit.     Allergies  Allergen Reactions  . Lidocaine     Face swelling     ROS Review of Systems  Constitutional: Positive for fatigue.  HENT: Negative.   Eyes: Negative.   Respiratory: Positive for shortness of breath (occasional ).   Cardiovascular: Negative.   Gastrointestinal: Positive for abdominal distention (left lower quadrant pain/bloating) and diarrhea (occasional).  Endocrine: Negative.   Genitourinary: Negative.   Musculoskeletal: Positive for neck pain (chronic ).  Skin: Negative.   Allergic/Immunologic: Negative.   Neurological: Negative.   Hematological: Negative.   Psychiatric/Behavioral: Positive for agitation (moderate ) and sleep disturbance. The patient is nervous/anxious and is hyperactive.    Objective:    Physical Exam  Constitutional: She is oriented to person, place, and time. She appears well-developed and well-nourished.  HENT:  Head: Normocephalic and atraumatic.  Eyes: Conjunctivae are normal.  Neck: Normal range of motion. Neck supple.  Cardiovascular: Normal rate, regular rhythm, normal heart sounds and intact distal pulses.  Pulmonary/Chest: Effort normal and breath sounds normal.  Abdominal: Soft. Bowel sounds are normal.  Musculoskeletal: Normal range of motion.  Neurological: She is alert and oriented to person, place, and time. She has normal reflexes.  Skin: Skin is warm and dry.  Nursing note and vitals reviewed.   BP 132/78 (BP Location: Left Arm, Patient Position: Sitting, Cuff Size: Small)   Pulse 80   Temp 98.1 F (36.7 C) (Oral)   Ht  (1.676 m)   Wt 163 lb 9.6 oz (74.2 kg)   SpO2 100%   BMI 26.41 kg/m   Wt Readings from Last 3 Encounters:  03/07/19 163 lb 9.6 oz (74.2 kg)  03/05/19 170 lb (77.1 kg)  03/01/19 170 lb (77.1 kg)     Health Maintenance Due  Topic Date Due  . COLONOSCOPY  03/11/2007  . MAMMOGRAM  02/23/2019    There are no preventive care reminders to display for this patient.  Lab Results  Component Value Date   TSH 0.99 11/22/2016   Lab Results  Component Value Date   WBC 9.9 11/04/2018   HGB 13.0 11/04/2018   HCT 43.9 11/04/2018   MCV 85.9 11/04/2018   PLT 274 11/04/2018   Lab Results  Component Value Date   NA  136 11/04/2018   K 3.7 11/04/2018   CO2 26 11/04/2018   GLUCOSE 92 11/04/2018   BUN 9 11/04/2018   CREATININE 0.73 11/04/2018   BILITOT 1.2 11/04/2018   ALKPHOS 89 11/04/2018   AST 18 11/04/2018   ALT 17 11/04/2018   PROT 7.3 11/04/2018   ALBUMIN 4.0 11/04/2018   CALCIUM 9.1 11/04/2018   ANIONGAP 9 11/04/2018   Lab Results  Component Value Date   CHOL 200 05/24/2016   Lab Results  Component Value Date   HDL 56 05/24/2016   Lab Results  Component Value Date   LDLCALC 122 05/24/2016   Lab Results  Component Value Date   TRIG 109 05/24/2016   Lab Results  Component Value Date   CHOLHDL 3.6 05/24/2016   Lab Results  Component Value Date   HGBA1C 5.6 06/30/2018      Assessment & Plan:   1. Abdominal discomfort, generalized - POCT urinalysis dipstick  2. Chronic pain of left knee Patient does not want to take previously prescribed pain medications to aide in her pain. We will initiate Tizanidine today.  - tiZANidine (ZANAFLEX) 4 MG capsule; Take 1 capsule (4 mg total) by mouth 2 (two) times daily as needed for muscle spasms.  Dispense: 30 capsule; Refill: 2  3. Low back pain, unspecified back pain laterality, unspecified chronicity, unspecified whether sciatica present - tiZANidine (ZANAFLEX) 4 MG capsule; Take 1 capsule (4 mg total) by mouth 2 (two) times daily as needed for muscle spasms.  Dispense: 30 capsule; Refill:  2  4. Pain in left wrist - tiZANidine (ZANAFLEX) 4 MG capsule; Take 1 capsule (4 mg total) by mouth 2 (two) times daily as needed for muscle spasms.  Dispense: 30 capsule; Refill: 2  5. Urinary tract infection with hematuria, site unspecified We will initiate Septra today.  - sulfamethoxazole-trimethoprim (BACTRIM DS) 800-160 MG tablet; Take 1 tablet by mouth 2 (two) times daily for 10 days.  Dispense: 20 tablet; Refill: 0  6. Diarrhea, unspecified type Stable today.   7. Dysuria Antibiotic sent to pharmacy today.   8. Follow up She will keep previously scheduled follow up appointment.   Meds ordered this encounter  Medications  . tiZANidine (ZANAFLEX) 4 MG capsule    Sig: Take 1 capsule (4 mg total) by mouth 2 (two) times daily as needed for muscle spasms.    Dispense:  30 capsule    Refill:  2  . sulfamethoxazole-trimethoprim (BACTRIM DS) 800-160 MG tablet    Sig: Take 1 tablet by mouth 2 (two) times daily for 10 days.    Dispense:  20 tablet    Refill:  0    Orders Placed This Encounter  Procedures  . POCT urinalysis dipstick    Referral Orders  No referral(s) requested today    Raliegh Ip,  MSN, FNP-C [facility] [address] [place], [place] [address] [phone]   Problem List Items Addressed This Visit      Other   Chronic pain of left knee   Relevant Medications   tiZANidine (ZANAFLEX) 4 MG capsule   Diarrhea   Low back pain   Relevant Medications   tiZANidine (ZANAFLEX) 4 MG capsule   Pain in left wrist   Relevant Medications   tiZANidine (ZANAFLEX) 4 MG capsule    Other Visit Diagnoses    Abdominal discomfort, generalized    -  Primary   Relevant Orders   POCT urinalysis dipstick (Completed)  Urinary tract infection with hematuria, site unspecified       Relevant Medications   sulfamethoxazole-trimethoprim (BACTRIM DS) 800-160 MG tablet   Dysuria       Follow up          Meds ordered this  encounter  Medications  . tiZANidine (ZANAFLEX) 4 MG capsule    Sig: Take 1 capsule (4 mg total) by mouth 2 (two) times daily as needed for muscle spasms.    Dispense:  30 capsule    Refill:  2  . sulfamethoxazole-trimethoprim (BACTRIM DS) 800-160 MG tablet    Sig: Take 1 tablet by mouth 2 (two) times daily for 10 days.    Dispense:  20 tablet    Refill:  0    Follow-up: No follow-ups on file.    Kallie LocksNatalie M Deshay Blumenfeld, FNP

## 2019-03-07 NOTE — Patient Instructions (Signed)
Sulfamethoxazole; Trimethoprim, SMX-TMP tablets What is this medicine? SULFAMETHOXAZOLE; TRIMETHOPRIM or SMX-TMP (suhl fuh meth OK suh zohl; trye METH oh prim) is a combination of a sulfonamide antibiotic and a second antibiotic, trimethoprim. It is used to treat or prevent certain kinds of bacterial infections. It will not work for colds, flu, or other viral infections. This medicine may be used for other purposes; ask your health care provider or pharmacist if you have questions. COMMON BRAND NAME(S): Bacter-Aid DS, Bactrim, Bactrim DS, Septra, Septra DS What should I tell my health care provider before I take this medicine? They need to know if you have any of these conditions: -anemia -asthma -being treated with anticonvulsants -if you frequently drink alcohol containing drinks -kidney disease -liver disease -low level of folic acid or glucose-6-phosphate dehydrogenase -poor nutrition or malabsorption -porphyria -severe allergies -thyroid disorder -an unusual or allergic reaction to sulfamethoxazole, trimethoprim, sulfa drugs, other medicines, foods, dyes, or preservatives -pregnant or trying to get pregnant -breast-feeding How should I use this medicine? Take this medicine by mouth with a full glass of water. Follow the directions on the prescription label. Take your medicine at regular intervals. Do not take it more often than directed. Do not skip doses or stop your medicine early. Talk to your pediatrician regarding the use of this medicine in children. Special care may be needed. This medicine has been used in children as young as 2 months of age. Overdosage: If you think you have taken too much of this medicine contact a poison control center or emergency room at once. NOTE: This medicine is only for you. Do not share this medicine with others. What if I miss a dose? If you miss a dose, take it as soon as you can. If it is almost time for your next dose, take only that dose. Do  not take double or extra doses. What may interact with this medicine? Do not take this medicine with any of the following medications: -aminobenzoate potassium -dofetilide -metronidazole This medicine may also interact with the following medications: -ACE inhibitors like benazepril, enalapril, lisinopril, and ramipril -birth control pills -cyclosporine -digoxin -diuretics -indomethacin -medicines for diabetes -methenamine -methotrexate -phenytoin -potassium supplements -pyrimethamine -sulfinpyrazone -tricyclic antidepressants -warfarin This list may not describe all possible interactions. Give your health care provider a list of all the medicines, herbs, non-prescription drugs, or dietary supplements you use. Also tell them if you smoke, drink alcohol, or use illegal drugs. Some items may interact with your medicine. What should I watch for while using this medicine? Tell your doctor or health care professional if your symptoms do not improve. Drink several glasses of water a day to reduce the risk of kidney problems. Do not treat diarrhea with over the counter products. Contact your doctor if you have diarrhea that lasts more than 2 days or if it is severe and watery. This medicine can make you more sensitive to the sun. Keep out of the sun. If you cannot avoid being in the sun, wear protective clothing and use a sunscreen. Do not use sun lamps or tanning beds/booths. What side effects may I notice from receiving this medicine? Side effects that you should report to your doctor or health care professional as soon as possible: -allergic reactions like skin rash or hives, swelling of the face, lips, or tongue -breathing problems -fever or chills, sore throat -irregular heartbeat, chest pain -joint or muscle pain -pain or difficulty passing urine -red pinpoint spots on skin -redness, blistering, peeling or loosening of   the skin, including inside the mouth -unusual bleeding or  bruising -unusually weak or tired -yellowing of the eyes or skin Side effects that usually do not require medical attention (report to your doctor or health care professional if they continue or are bothersome): -diarrhea -dizziness -headache -loss of appetite -nausea, vomiting -nervousness This list may not describe all possible side effects. Call your doctor for medical advice about side effects. You may report side effects to FDA at 1-800-FDA-1088. Where should I keep my medicine? Keep out of the reach of children. Store at room temperature between 20 to 25 degrees C (68 to 77 degrees F). Protect from light. Throw away any unused medicine after the expiration date. NOTE: This sheet is a summary. It may not cover all possible information. If you have questions about this medicine, talk to your doctor, pharmacist, or health care provider.  2019 Elsevier/Gold Standard (2013-06-08 14:38:26) Urinary Tract Infection, Adult A urinary tract infection (UTI) is an infection of any part of the urinary tract. The urinary tract includes:  The kidneys.  The ureters.  The bladder.  The urethra. These organs make, store, and get rid of pee (urine) in the body. What are the causes? This is caused by germs (bacteria) in your genital area. These germs grow and cause swelling (inflammation) of your urinary tract. What increases the risk? You are more likely to develop this condition if:  You have a small, thin tube (catheter) to drain pee.  You cannot control when you pee or poop (incontinence).  You are female, and: ? You use these methods to prevent pregnancy: ? A medicine that kills sperm (spermicide). ? A device that blocks sperm (diaphragm). ? You have low levels of a female hormone (estrogen). ? You are pregnant.  You have genes that add to your risk.  You are sexually active.  You take antibiotic medicines.  You have trouble peeing because of: ? A prostate that is bigger than  normal, if you are female. ? A blockage in the part of your body that drains pee from the bladder (urethra). ? A kidney stone. ? A nerve condition that affects your bladder (neurogenic bladder). ? Not getting enough to drink. ? Not peeing often enough.  You have other conditions, such as: ? Diabetes. ? A weak disease-fighting system (immune system). ? Sickle cell disease. ? Gout. ? Injury of the spine. What are the signs or symptoms? Symptoms of this condition include:  Needing to pee right away (urgently).  Peeing often.  Peeing small amounts often.  Pain or burning when peeing.  Blood in the pee.  Pee that smells bad or not like normal.  Trouble peeing.  Pee that is cloudy.  Fluid coming from the vagina, if you are female.  Pain in the belly or lower back. Other symptoms include:  Throwing up (vomiting).  No urge to eat.  Feeling mixed up (confused).  Being tired and grouchy (irritable).  A fever.  Watery poop (diarrhea). How is this treated? This condition may be treated with:  Antibiotic medicine.  Other medicines.  Drinking enough water. Follow these instructions at home:  Medicines  Take over-the-counter and prescription medicines only as told by your doctor.  If you were prescribed an antibiotic medicine, take it as told by your doctor. Do not stop taking it even if you start to feel better. General instructions  Make sure you: ? Pee until your bladder is empty. ? Do not hold pee for a long time. ?   Empty your bladder after sex. ? Wipe from front to back after pooping if you are a female. Use each tissue one time when you wipe.  Drink enough fluid to keep your pee pale yellow.  Keep all follow-up visits as told by your doctor. This is important. Contact a doctor if:  You do not get better after 1-2 days.  Your symptoms go away and then come back. Get help right away if:  You have very bad back pain.  You have very bad pain in  your lower belly.  You have a fever.  You are sick to your stomach (nauseous).  You are throwing up. Summary  A urinary tract infection (UTI) is an infection of any part of the urinary tract.  This condition is caused by germs in your genital area.  There are many risk factors for a UTI. These include having a small, thin tube to drain pee and not being able to control when you pee or poop.  Treatment includes antibiotic medicines for germs.  Drink enough fluid to keep your pee pale yellow. This information is not intended to replace advice given to you by your health care provider. Make sure you discuss any questions you have with your health care provider. Document Released: 04/19/2008 Document Revised: 05/11/2018 Document Reviewed: 05/11/2018 Elsevier Interactive Patient Education  2019 [employer] Muscle Cramps and Spasms Muscle cramps and spasms are when muscles tighten by themselves. They usually get better within minutes. Muscle cramps are painful. They are usually stronger and last longer than muscle spasms. Muscle spasms may or may not be painful. They can last a few seconds or much longer. Cramps and spasms can affect any muscle, but they occur most often in the calf muscles of the leg. They are usually not caused by a serious problem. In many cases, the cause is not known. Some common causes include:  Doing more physical work or exercise than your body is ready for.  Using the muscles too much (overuse) by repeating certain movements too many times.  Staying in a certain position for a long time.  Playing a sport or doing an activity without preparing properly.  Using bad form or technique while playing a sport or doing an activity.  Not having enough water in your body (dehydration).  Injury.  Side effects of some medicines.  Low levels of the salts and minerals in your blood (electrolytes), such as low potassium or calcium. Follow these instructions at home:  Managing pain and stiffness      Massage, stretch, and relax the muscle. Do this for many minutes at a time.  If told, put heat on tight or tense muscles as often as told by your doctor. Use the heat source that your doctor recommends, such as a moist heat pack or a heating pad. ? Place a towel between your skin and the heat source. ? Leave the heat on for 20-30 minutes. ? Remove the heat if your skin turns bright red. This is very important if you are not able to feel pain, heat, or cold. You may have a greater risk of getting burned.  If told, put ice on the affected area. This may help if you are sore or have pain after a cramp or spasm. ? Put ice in a plastic bag. ? Place a towel between your skin and the bag. ? Leave the ice on for 20 minutes, 2-3 times a day.  Try taking hot showers or baths to help  relax tight muscles. Eating and drinking  Drink enough fluid to keep your pee (urine) pale yellow.  Eat a healthy diet to help ensure that your muscles work well. This should include: ? Fruits and vegetables. ? Lean protein. ? Whole grains. ? Low-fat or nonfat dairy products. General instructions  If you are having cramps often, avoid intense exercise for several days.  Take over-the-counter and prescription medicines only as told by your doctor.  Watch for any changes in your symptoms.  Keep all follow-up visits as told by your doctor. This is important. Contact a doctor if:  Your cramps or spasms get worse or happen more often.  Your cramps or spasms do not get better with time. Summary  Muscle cramps and spasms are when muscles tighten by themselves. They usually get better within minutes.  Cramps and spasms occur most often in the calf muscles of the leg.  Massage, stretch, and relax the muscle. This may help the cramp or spasm go away.  Drink enough fluid to keep your pee (urine) pale yellow. This information is not intended to replace advice given to you by  your health care provider. Make sure you discuss any questions you have with your health care provider. Document Released: 10/14/2008 Document Revised: 03/27/2018 Document Reviewed: 03/27/2018 Elsevier Interactive Patient Education  2019 [employer] Tizanidine tablets or capsules What is this medicine? TIZANIDINE (tye ZAN i deen) helps to relieve muscle spasms. It may be used to help in the treatment of multiple sclerosis and spinal cord injury. This medicine may be used for other purposes; ask your health care provider or pharmacist if you have questions. COMMON BRAND NAME(S): Zanaflex What should I tell my health care provider before I take this medicine? They need to know if you have any of these conditions: -kidney disease -liver disease -low blood pressure -mental disorder -an unusual or allergic reaction to tizanidine, other medicines, lactose (tablets only), foods, dyes, or preservatives -pregnant or trying to get pregnant -breast-feeding How should I use this medicine? Take this medicine by mouth with a full glass of water. Take this medicine on an empty stomach, at least 30 minutes before or 2 hours after food. Do not take with food unless you talk with your doctor. Follow the directions on the prescription label. Take your medicine at regular intervals. Do not take your medicine more often than directed. Do not stop taking except on your doctor's advice. Suddenly stopping the medicine can be very dangerous. Talk to your pediatrician regarding the use of this medicine in children. Patients over 36 years old may have a stronger reaction and need a smaller dose. Overdosage: If you think you have taken too much of this medicine contact a poison control center or emergency room at once. NOTE: This medicine is only for you. Do not share this medicine with others. What if I miss a dose? If you miss a dose, take it as soon as you can. If it is almost time for your next dose, take only  that dose. Do not take double or extra doses. What may interact with this medicine? Do not take this medicine with any of the following medications: -ciprofloxacin -fluvoxamine -narcotic medicines for cough -thiabendazole This medicine may also interact with the following medications: -acyclovir -alcohol -antihistamines for allergy, cough, and cold -baclofen -certain medicines for anxiety or sleep -certain medicines for blood pressure, heart disease, irregular heartbeat -certain medicines for depression like amitriptyline, fluoxetine, sertraline -certain medicines for seizures like phenobarbital, primidone -  certain medicines for stomach problems like cimetidine, famotidine -female hormones, like estrogens or progestins and birth control pills, patches, rings, or injections -general anesthetics like halothane, isoflurane, methoxyflurane, propofol -local anesthetics like lidocaine, pramoxine, tetracaine -medicines that relax muscles for surgery -narcotic medicines for pain -phenothiazines like chlorpromazine, mesoridazine, prochlorperazine -ticlopidine -zileuton This list may not describe all possible interactions. Give your health care provider a list of all the medicines, herbs, non-prescription drugs, or dietary supplements you use. Also tell them if you smoke, drink alcohol, or use illegal drugs. Some items may interact with your medicine. What should I watch for while using this medicine? Tell your doctor or health care professional if your symptoms do not start to get better or if they get worse. You may get drowsy or dizzy. Do not drive, use machinery, or do anything that needs mental alertness until you know how this medicine affects you. Do not stand or sit up quickly, especially if you are an older patient. This reduces the risk of dizzy or fainting spells. Alcohol may interfere with the effect of this medicine. Avoid alcoholic drinks. If you are taking another medicine that also  causes drowsiness, you may have more side effects. Give your health care provider a list of all medicines you use. Your doctor will tell you how much medicine to take. Do not take more medicine than directed. Call emergency for help if you have problems breathing or unusual sleepiness. Your mouth may get dry. Chewing sugarless gum or sucking hard candy, and drinking plenty of water may help. Contact your doctor if the problem does not go away or is severe. What side effects may I notice from receiving this medicine? Side effects that you should report to your doctor or health care professional as soon as possible: -allergic reactions like skin rash, itching or hives, swelling of the face, lips, or tongue -breathing problems -hallucinations -signs and symptoms of liver injury like dark yellow or brown urine; general ill feeling or flu-like symptoms; light-colored stools; loss of appetite; nausea; right upper quadrant belly pain; unusually weak or tired; yellowing of the eyes or skin -signs and symptoms of low blood pressure like dizziness; feeling faint or lightheaded, falls; unusually weak or tired -unusually slow heartbeat -unusually weak or tired Side effects that usually do not require medical attention (report to your doctor or health care professional if they continue or are bothersome): -blurred vision -constipation -dizziness -dry mouth -tiredness This list may not describe all possible side effects. Call your doctor for medical advice about side effects. You may report side effects to FDA at 1-800-FDA-1088. Where should I keep my medicine? Keep out of the reach of children. Store at room temperature between 15 and 30 degrees C (59 and 86 degrees F). Throw away any unused medicine after the expiration date. NOTE: This sheet is a summary. It may not cover all possible information. If you have questions about this medicine, talk to your doctor, pharmacist, or health care provider.  2019  [employer]vier/Gold Standard (2017-08-16 13:33:29)

## 2019-03-13 ENCOUNTER — Emergency Department (HOSPITAL_COMMUNITY): Payer: Medicaid Other

## 2019-03-13 ENCOUNTER — Encounter (HOSPITAL_COMMUNITY): Payer: Self-pay

## 2019-03-13 ENCOUNTER — Emergency Department (HOSPITAL_COMMUNITY)
Admission: EM | Admit: 2019-03-13 | Discharge: 2019-03-13 | Disposition: A | Discharge status: Home / Self Care | Payer: Medicaid Other | Attending: Emergency Medicine | Admitting: Emergency Medicine

## 2019-03-13 ENCOUNTER — Other Ambulatory Visit: Payer: Self-pay

## 2019-03-13 DIAGNOSIS — R0789 Other chest pain: Secondary | ICD-10-CM | POA: Diagnosis not present

## 2019-03-13 DIAGNOSIS — Z79899 Other long term (current) drug therapy: Secondary | ICD-10-CM | POA: Diagnosis not present

## 2019-03-13 DIAGNOSIS — R079 Chest pain, unspecified: Secondary | ICD-10-CM | POA: Diagnosis present

## 2019-03-13 LAB — URINALYSIS, ROUTINE W REFLEX MICROSCOPIC
Bacteria, UA: NONE SEEN
Bilirubin Urine: NEGATIVE
Glucose, UA: NEGATIVE mg/dL
Hgb urine dipstick: NEGATIVE
Ketones, ur: NEGATIVE mg/dL
Nitrite: NEGATIVE
Protein, ur: NEGATIVE mg/dL
Specific Gravity, Urine: 1.01 (ref 1.005–1.030)
pH: 6 (ref 5.0–8.0)

## 2019-03-13 LAB — COMPREHENSIVE METABOLIC PANEL
ALT: 17 U/L (ref 0–44)
AST: 18 U/L (ref 15–41)
Albumin: 4.1 g/dL (ref 3.5–5.0)
Alkaline Phosphatase: 94 U/L (ref 38–126)
Anion gap: 9 (ref 5–15)
BUN: 13 mg/dL (ref 8–23)
CO2: 26 mmol/L (ref 22–32)
Calcium: 9 mg/dL (ref 8.9–10.3)
Chloride: 103 mmol/L (ref 98–111)
Creatinine, Ser: 0.66 mg/dL (ref 0.44–1.00)
GFR calc Af Amer: >60 mL/min (ref >60)
GFR calc non Af Amer: >60 mL/min (ref >60)
Glucose, Bld: 95 mg/dL (ref 70–99)
Potassium: 3.6 mmol/L (ref 3.5–5.1)
Sodium: 138 mmol/L (ref 135–145)
Total Bilirubin: 0.6 mg/dL (ref 0.3–1.2)
Total Protein: 7.7 g/dL (ref 6.5–8.1)

## 2019-03-13 LAB — TROPONIN I: Troponin I: <0.03 ng/mL (ref <0.03)

## 2019-03-13 LAB — CBC WITH DIFFERENTIAL/PLATELET
Abs Immature Granulocytes: 0.04 10*3/uL (ref 0.00–0.07)
Basophils Absolute: 0.1 10*3/uL (ref 0.0–0.1)
Basophils Relative: 1 %
Eosinophils Absolute: 0.3 10*3/uL (ref 0.0–0.5)
Eosinophils Relative: 3 %
HCT: 42.8 % (ref 36.0–46.0)
Hemoglobin: 12.9 g/dL (ref 12.0–15.0)
Immature Granulocytes: 0 %
Lymphocytes Relative: 23 %
Lymphs Abs: 2.5 10*3/uL (ref 0.7–4.0)
MCH: 26.1 pg (ref 26.0–34.0)
MCHC: 30.1 g/dL (ref 30.0–36.0)
MCV: 86.5 fL (ref 80.0–100.0)
Monocytes Absolute: 0.8 10*3/uL (ref 0.1–1.0)
Monocytes Relative: 7 %
Neutro Abs: 7.2 10*3/uL (ref 1.7–7.7)
Neutrophils Relative %: 66 %
Platelets: 317 10*3/uL (ref 150–400)
RBC: 4.95 MIL/uL (ref 3.87–5.11)
RDW: 14.2 % (ref 11.5–15.5)
WBC: 10.9 10*3/uL — ABNORMAL HIGH (ref 4.0–10.5)
nRBC: 0 % (ref 0.0–0.2)

## 2019-03-13 NOTE — ED Provider Notes (Signed)
[facility]-EMERGENCY DEPT Provider Note   CSN: [id number] Arrival date & time: 03/13/19  1301    History   Chief Complaint Chief Complaint  Patient presents with  . Chest Pain  . Back Pain    HPI Felicia Collier is a 63 y.o. female.     Pt presents to the ED today with cp and back pain.  She also has abdominal pain, hand pain, knee pain, and leg pain.  I asked her if she hurts everywhere and she said yes.  She also said that her hands feel real hot.  She said the cp started after her neck popped last week.  She thinks her symptoms are because she has had a hysterectomy and all her kids have been taken away from her.  She has a hx of schizophrenia and said she's been taking her meds.  She denies si/hi.      Past Medical History:  Diagnosis Date  . Abdominal pain   . Chronic back pain   . Chronic knee pain   . Diverticulitis   . Schizophrenia [facility](HCC)     Patient Active Problem List   Diagnosis Date Noted  . Carpal tunnel syndrome of left wrist 01/16/2019  . Low back pain 01/01/2019  . Diarrhea 07/25/2018  . Pain in left wrist 07/20/2017  . Schizophrenia (HCC) 05/01/2017  . Chronic pain of left knee 02/09/2017  . Unilateral primary osteoarthritis, left knee 02/09/2017  . Neuropathy 11/22/2016  . Gastroesophageal reflux disease without esophagitis 11/22/2016  . Weight gain 11/22/2016  . Atypical chest pain 11/04/2016    Past Surgical History:  Procedure Laterality Date  . ABDOMINAL HYSTERECTOMY    . CHOLECYSTECTOMY    . KNEE SURGERY Left   . WRIST SURGERY Left      OB History   No obstetric history on file.      Home Medications    Prior to Admission medications   Medication Sig Start Date End Date Taking? Authorizing Provider  capsicum (ZOSTRIX) 0.075 % topical cream Apply 1 application topically 2 (two) times daily. Apply a think film to affected areas 3-4 times daily. Patient not taking: Reported on 03/07/2019 01/31/19   McDonald,  Pedro EarlsMia A, PA-C  meloxicam (MOBIC) 15 MG tablet Take 1 tablet daily as needed for pain. Patient not taking: Reported on 03/07/2019 03/01/19   Molpus, Jonny RuizJohn, MD  omeprazole (PRILOSEC) 20 MG capsule Take 1 capsule every morning at least 30 minutes before first dose of Carafate. Patient not taking: Reported on 03/07/2019 03/01/19   Molpus, John, MD  sucralfate (CARAFATE) 1 g tablet Take 1 tablet (1 g total) by mouth 4 (four) times daily. Patient not taking: Reported on 03/07/2019 03/01/19   Molpus, Jonny RuizJohn, MD  sulfamethoxazole-trimethoprim (BACTRIM DS) 800-160 MG tablet Take 1 tablet by mouth 2 (two) times daily for 10 days. 03/07/19 03/17/19  Kallie LocksStroud, Natalie M, FNP  tiZANidine (ZANAFLEX) 4 MG capsule Take 1 capsule (4 mg total) by mouth 2 (two) times daily as needed for muscle spasms. 03/07/19   Kallie LocksStroud, Natalie M, FNP    Family History Family History  Problem Relation Age of Onset  . Other Mother        no health conditions per patient  . Other Father        no health conditions per patient    Social History Social History   Tobacco Use  . Smoking status: Never Smoker  . Smokeless tobacco: Never Used  Substance Use Topics  .  Alcohol use: No  . Drug use: No     Allergies   Lidocaine   Review of Systems Review of Systems  Cardiovascular: Positive for chest pain.  Musculoskeletal: Positive for arthralgias, back pain and myalgias.  All other systems reviewed and are negative.    Physical Exam Updated Vital Signs BP (!) 141/85 (BP Location: Left Arm)   Pulse (!) 57   Temp 98.6 F (37 C) (Oral)   Resp 16   Ht  (1.676 m)   Wt 74.2 kg   SpO2 100%   BMI 26.40 kg/m   Physical Exam Vitals signs and nursing note reviewed.  Constitutional:      Appearance: She is well-developed. She is obese.  HENT:     Head: Normocephalic and atraumatic.  Eyes:     Extraocular Movements: Extraocular movements intact.     Pupils: Pupils are equal, round, and reactive to light.  Neck:      Musculoskeletal: Normal range of motion and neck supple.  Cardiovascular:     Rate and Rhythm: Normal rate and regular rhythm.     Heart sounds: Normal heart sounds.  Pulmonary:     Effort: Pulmonary effort is normal.     Breath sounds: Normal breath sounds.  Abdominal:     General: Bowel sounds are normal.     Palpations: Abdomen is soft.  Musculoskeletal: Normal range of motion.  Skin:    General: Skin is warm.     Capillary Refill: Capillary refill takes less than 2 seconds.  Neurological:     General: No focal deficit present.     Mental Status: She is alert and oriented to person, place, and time.  Psychiatric:        Speech: Speech is tangential.      ED Treatments / Results  Labs (all labs ordered are listed, but only abnormal results are displayed) Labs Reviewed  CBC WITH DIFFERENTIAL/PLATELET - Abnormal; Notable for the following components:      Result Value   WBC 10.9 (*)    All other components within normal limits  URINALYSIS, ROUTINE W REFLEX MICROSCOPIC - Abnormal; Notable for the following components:   Color, Urine STRAW (*)    Leukocytes,Ua TRACE (*)    All other components within normal limits  COMPREHENSIVE METABOLIC PANEL  TROPONIN I    EKG EKG Interpretation  Date/Time:  Tuesday March 13 2019 13:39:28 EDT Ventricular Rate:  66 PR Interval:    QRS Duration: 85 QT Interval:  423 QTC Calculation: 444 R Axis:   13 Text Interpretation:  Sinus rhythm Abnormal R-wave progression, early transition Left ventricular hypertrophy Borderline T abnormalities, inferior leads Borderline ST elevation, lateral leads Baseline wander in lead(s) II No significant change since last tracing Confirmed by Jacalyn Lefevre [phone]) on 03/13/2019 1:42:26 PM   Radiology Dg Chest 2 View  Result Date: 03/13/2019 CLINICAL DATA:  Mid chest pain for 2 months EXAM: CHEST - 2 VIEW COMPARISON:  None. FINDINGS: The heart size and mediastinal contours are within normal limits.  Both lungs are clear. The visualized skeletal structures are unremarkable. IMPRESSION: No active cardiopulmonary disease. Electronically Signed   By: Elige Ko   On: 03/13/2019 14:27    Procedures Procedures (including critical care time)  Medications Ordered in ED Medications - No data to display   Initial Impression / Assessment and Plan / ED Course  I have reviewed the triage vital signs and the nursing notes.  Pertinent labs & imaging results that  were available during my care of the patient were reviewed by me and considered in my medical decision making (see chart for details).     CP is very atypical.  Strongly doubt CAD.  EKG and troponin ok.  She has some bizarre statements, but she is not actively hallucinating and is not suicidal or homicidal.  That is likely her baseline.  It also looks like many of her complaints are chronic.  She's been to her pcp and to the ED multiple times recently.   Pt is stable for d/c.  Return if worse.    Final Clinical Impressions(s) / ED Diagnoses   Final diagnoses:  Atypical chest pain    ED Discharge Orders    None       Jacalyn Lefevre, MD 03/13/19 1453

## 2019-03-13 NOTE — ED Triage Notes (Addendum)
Patient c/o chest pain and back pain. Patient unable to state when chest pain started. Patient states this hospital is not Dr. Nickolas Madrid and she had a pain in her neck last week that went down to her chest and back and punctured it. Patient says yes to every questioned asked regarding symptoms.  Patient stated, "I take the same medicines as everybody else and they are cured. Why can't ya'll cure me."

## 2019-03-15 ENCOUNTER — Encounter (HOSPITAL_COMMUNITY): Payer: Self-pay | Admitting: Emergency Medicine

## 2019-03-15 ENCOUNTER — Other Ambulatory Visit: Payer: Self-pay

## 2019-03-15 ENCOUNTER — Emergency Department (HOSPITAL_COMMUNITY)
Admission: EM | Admit: 2019-03-15 | Discharge: 2019-03-15 | Disposition: A | Discharge status: Home / Self Care | Payer: Medicaid Other | Attending: Emergency Medicine | Admitting: Emergency Medicine

## 2019-03-15 DIAGNOSIS — R208 Other disturbances of skin sensation: Secondary | ICD-10-CM

## 2019-03-15 DIAGNOSIS — Z79899 Other long term (current) drug therapy: Secondary | ICD-10-CM | POA: Diagnosis not present

## 2019-03-15 DIAGNOSIS — R51 Headache: Secondary | ICD-10-CM | POA: Diagnosis present

## 2019-03-15 MED ORDER — GABAPENTIN 100 MG PO CAPS: 100.0000 mg | ORAL_CAPSULE | Freq: Three times a day (TID) | ORAL | 0 refills | Status: DC

## 2019-03-15 NOTE — ED Triage Notes (Signed)
Pt states having facing stinging since Jan. Reports put Vaseline on it to help.

## 2019-03-15 NOTE — ED Provider Notes (Signed)
[facility]-EMERGENCY DEPT Provider Note   CSN: [id number] Arrival date & time: 03/15/19  1056    History   Chief Complaint Chief Complaint  Patient presents with  . Facial Pain    HPI Felicia Collier is a 63 y.o. female.     HPI   63yF with numerous complaints. Primarily facial burning. Says ongoing for months but worse since last night. Also burning in b/l hand. Has tried vaseline w/o improvement. No weakness. No acute pain. Very tangential and has a lot of other complaints which seem non-acute and non contributory ("both my feet hurt", "my kneecap" etc).   Past Medical History:  Diagnosis Date  . Abdominal pain   . Chronic back pain   . Chronic knee pain   . Diverticulitis   . Schizophrenia [facility](HCC)     Patient Active Problem List   Diagnosis Date Noted  . Carpal tunnel syndrome of left wrist 01/16/2019  . Low back pain 01/01/2019  . Diarrhea 07/25/2018  . Pain in left wrist 07/20/2017  . Schizophrenia (HCC) 05/01/2017  . Chronic pain of left knee 02/09/2017  . Unilateral primary osteoarthritis, left knee 02/09/2017  . Neuropathy 11/22/2016  . Gastroesophageal reflux disease without esophagitis 11/22/2016  . Weight gain 11/22/2016  . Atypical chest pain 11/04/2016    Past Surgical History:  Procedure Laterality Date  . ABDOMINAL HYSTERECTOMY    . CHOLECYSTECTOMY    . KNEE SURGERY Left   . WRIST SURGERY Left      OB History   No obstetric history on file.      Home Medications    Prior to Admission medications   Medication Sig Start Date End Date Taking? Authorizing Provider  gabapentin (NEURONTIN) 100 MG capsule Take 1 capsule (100 mg total) by mouth 3 (three) times daily. 03/15/19   Raeford RazorKohut, Wadie Liew, MD  meloxicam (MOBIC) 15 MG tablet Take 1 tablet daily as needed for pain. Patient not taking: Reported on 03/07/2019 03/01/19   Molpus, Jonny RuizJohn, MD  omeprazole (PRILOSEC) 20 MG capsule Take 1 capsule every morning at least 30 minutes  before first dose of Carafate. Patient not taking: Reported on 03/07/2019 03/01/19   Molpus, John, MD  sucralfate (CARAFATE) 1 g tablet Take 1 tablet (1 g total) by mouth 4 (four) times daily. Patient not taking: Reported on 03/07/2019 03/01/19   Molpus, Jonny RuizJohn, MD  sulfamethoxazole-trimethoprim (BACTRIM DS) 800-160 MG tablet Take 1 tablet by mouth 2 (two) times daily for 10 days. 03/07/19 03/17/19  Kallie LocksStroud, Natalie M, FNP  tiZANidine (ZANAFLEX) 4 MG capsule Take 1 capsule (4 mg total) by mouth 2 (two) times daily as needed for muscle spasms. 03/07/19   Kallie LocksStroud, Natalie M, FNP    Family History Family History  Problem Relation Age of Onset  . Other Mother        no health conditions per patient  . Other Father        no health conditions per patient    Social History Social History   Tobacco Use  . Smoking status: Never Smoker  . Smokeless tobacco: Never Used  Substance Use Topics  . Alcohol use: No  . Drug use: No     Allergies   Lidocaine   Review of Systems Review of Systems  All systems reviewed and negative, other than as noted in HPI.  Physical Exam Updated Vital Signs BP (!) 152/98 (BP Location: Left Arm)   Pulse 89   Temp 98.3 F (36.8 C) (Oral)  Resp 18   SpO2 98%   Physical Exam Vitals signs and nursing note reviewed.  Constitutional:      General: She is not in acute distress.    Appearance: She is well-developed.  HENT:     Head: Normocephalic and atraumatic.  Eyes:     General:        Right eye: No discharge.        Left eye: No discharge.     Conjunctiva/sclera: Conjunctivae normal.  Neck:     Musculoskeletal: Neck supple.  Cardiovascular:     Rate and Rhythm: Normal rate and regular rhythm.     Heart sounds: Normal heart sounds. No murmur. No friction rub. No gallop.   Pulmonary:     Effort: Pulmonary effort is normal. No respiratory distress.     Breath sounds: Normal breath sounds.  Abdominal:     General: There is no distension.      Palpations: Abdomen is soft.     Tenderness: There is no abdominal tenderness.  Musculoskeletal:        General: No tenderness.  Skin:    General: Skin is warm and dry.  Neurological:     Mental Status: She is alert.  Psychiatric:     Comments: Very tangential      ED Treatments / Results  Labs (all labs ordered are listed, but only abnormal results are displayed) Labs Reviewed - No data to display  EKG None  Radiology Dg Chest 2 View  Result Date: 03/13/2019 CLINICAL DATA:  Mid chest pain for 2 months EXAM: CHEST - 2 VIEW COMPARISON:  None. FINDINGS: The heart size and mediastinal contours are within normal limits. Both lungs are clear. The visualized skeletal structures are unremarkable. IMPRESSION: No active cardiopulmonary disease. Electronically Signed   By: Elige Ko   On: 03/13/2019 14:27    Procedures Procedures (including critical care time)  Medications Ordered in ED Medications - No data to display   Initial Impression / Assessment and Plan / ED Course  I have reviewed the triage vital signs and the nursing notes.  Pertinent labs & imaging results that were available during my care of the patient were reviewed by me and considered in my medical decision making (see chart for details).    63yF with facial burning physical exam unremarkable. Advised to make sure she is not using capsaicin cream that was prescribed on a recent ED visit. WIll try low dose gabapentin. Outpt FU otherwise.   Final Clinical Impressions(s) / ED Diagnoses   Final diagnoses:  Facial burning    ED Discharge Orders         Ordered    gabapentin (NEURONTIN) 100 MG capsule  3 times daily     03/15/19 1202           Raeford Razor, MD 03/15/19 1352

## 2019-03-15 NOTE — Discharge Instructions (Signed)
If you are still using the capsicum cream you were prescribed on 01/31/19 then stop. This will make your skin burn.

## 2019-03-23 ENCOUNTER — Other Ambulatory Visit: Payer: Self-pay

## 2019-03-23 ENCOUNTER — Ambulatory Visit (INDEPENDENT_AMBULATORY_CARE_PROVIDER_SITE_OTHER): Payer: Medicaid Other | Admitting: Family Medicine

## 2019-03-23 DIAGNOSIS — R1084 Generalized abdominal pain: Secondary | ICD-10-CM

## 2019-03-23 DIAGNOSIS — M545 Low back pain, unspecified: Secondary | ICD-10-CM

## 2019-03-23 DIAGNOSIS — M25562 Pain in left knee: Secondary | ICD-10-CM | POA: Diagnosis not present

## 2019-03-23 DIAGNOSIS — M25532 Pain in left wrist: Secondary | ICD-10-CM | POA: Diagnosis not present

## 2019-03-23 DIAGNOSIS — F203 Undifferentiated schizophrenia: Secondary | ICD-10-CM

## 2019-03-23 DIAGNOSIS — Z09 Encounter for follow-up examination after completed treatment for conditions other than malignant neoplasm: Secondary | ICD-10-CM

## 2019-03-23 DIAGNOSIS — G8929 Other chronic pain: Secondary | ICD-10-CM

## 2019-03-23 NOTE — Progress Notes (Signed)
Virtual Visit via Telephone Note  I connected with LAKEITHA HOBBS on 03/23/19 at  1:00 PM EDT by telephone and verified that I am speaking with the correct person using two identifiers.   I discussed the limitations, risks, security and privacy concerns of performing an evaluation and management service by telephone and the availability of in person appointments. I also discussed with the patient that there may be a patient responsible charge related to this service. The patient expressed understanding and agreed to proceed.   History of Present Illness:  Past Medical History:  Diagnosis Date  . Abdominal pain   . Chronic back pain   . Chronic knee pain   . Diverticulitis   . Schizophrenia (HCC)     Current Outpatient Medications on File Prior to Visit  Medication Sig Dispense Refill  . gabapentin (NEURONTIN) 100 MG capsule Take 1 capsule (100 mg total) by mouth 3 (three) times daily. 21 capsule 0  . meloxicam (MOBIC) 15 MG tablet Take 1 tablet daily as needed for pain. (Patient not taking: Reported on 03/07/2019) 30 tablet 0  . omeprazole (PRILOSEC) 20 MG capsule Take 1 capsule every morning at least 30 minutes before first dose of Carafate. (Patient not taking: Reported on 03/07/2019) 30 capsule 0  . sucralfate (CARAFATE) 1 g tablet Take 1 tablet (1 g total) by mouth 4 (four) times daily. (Patient not taking: Reported on 03/07/2019) 40 tablet 0  . tiZANidine (ZANAFLEX) 4 MG capsule Take 1 capsule (4 mg total) by mouth 2 (two) times daily as needed for muscle spasms. 30 capsule 2   No current facility-administered medications on file prior to visit.     Current Status: Since her last office visit, she has has an ED visit for Facial Burning. Today, she is doing well with no complaints, but states that she refuses to take medications for pain, 'because it will mess up her procedure for teeth.' She continues to follow up with Psychiatrist as needed. Her conversation appears to be 'scattered'  today, she has rapid speech, and she seems anxious. She denies suicidal ideations, homicidal ideations, or auditory hallucinations. She is currently not taking any medications.   She denies fevers, chills, fatigue, recent infections, weight loss, and night sweats. She has not had any headaches, visual changes, dizziness, and falls. No chest pain, heart palpitations, cough and shortness of breath reported. No reports of GI problems such as nausea, vomiting, diarrhea, and constipation. She has no reports of blood in stools, dysuria and hematuria. She denies pain today.   Observations/Objective:  Telephone Virtual Visit  Assessment and Plan:  1. Hospital discharge follow-up Patient was prescribed pain medication from ED Physician and also from PCP, but is refusing to take any medications at this time.   2. Schizophrenia Patient appears to be delusional today upon assessment. Advised patient to begin taking anti-psychotic medications as prescribed by Psychiatrist, but patient refused. We will contact Psychiatrist at [facility] for further assessment of patient's psychotic condition.   3. Chronic pain of left knee  4. Low back pain, unspecified back pain laterality, unspecified chronicity, unspecified whether sciatica present  5. Pain in left wrist  6. Abdominal discomfort, generalized  No orders of the defined types were placed in this encounter.   No orders of the defined types were placed in this encounter.   Referral Orders  No referral(s) requested today    Raliegh Ip,  MSN, FNP-C [facility] [address] [place]  [place], [place] [id number] [phone]   Follow Up Instructions:  She will keep follow up appointment for 04/02/2019.   I discussed the assessment and treatment plan with the patient. The patient was provided an opportunity to ask questions and all were answered. The patient agreed with the plan and demonstrated an understanding of  the instructions.   The patient was advised to call back or seek an in-person evaluation if the symptoms worsen or if the condition fails to improve as anticipated.  I provided 20 minutes of non-face-to-face time during this encounter.   Kallie LocksNatalie M Sharnae Winfree, FNP

## 2019-04-02 ENCOUNTER — Other Ambulatory Visit: Payer: Self-pay

## 2019-04-02 ENCOUNTER — Encounter: Payer: Self-pay | Admitting: Family Medicine

## 2019-04-02 ENCOUNTER — Ambulatory Visit (INDEPENDENT_AMBULATORY_CARE_PROVIDER_SITE_OTHER): Payer: Medicaid Other | Admitting: Family Medicine

## 2019-04-02 VITALS — BP 120/70 | HR 80 | Temp 98.0°F | Ht 66.0 in | Wt 166.0 lb

## 2019-04-02 DIAGNOSIS — Z5320 Procedure and treatment not carried out because of patient's decision for unspecified reasons: Secondary | ICD-10-CM

## 2019-04-02 DIAGNOSIS — F3010 Manic episode without psychotic symptoms, unspecified: Secondary | ICD-10-CM | POA: Diagnosis not present

## 2019-04-02 DIAGNOSIS — R440 Auditory hallucinations: Secondary | ICD-10-CM | POA: Insufficient documentation

## 2019-04-02 DIAGNOSIS — R441 Visual hallucinations: Secondary | ICD-10-CM | POA: Insufficient documentation

## 2019-04-02 DIAGNOSIS — F203 Undifferentiated schizophrenia: Secondary | ICD-10-CM

## 2019-04-02 DIAGNOSIS — G8929 Other chronic pain: Secondary | ICD-10-CM

## 2019-04-02 DIAGNOSIS — G894 Chronic pain syndrome: Secondary | ICD-10-CM

## 2019-04-02 DIAGNOSIS — M25562 Pain in left knee: Secondary | ICD-10-CM | POA: Diagnosis not present

## 2019-04-02 DIAGNOSIS — Z09 Encounter for follow-up examination after completed treatment for conditions other than malignant neoplasm: Secondary | ICD-10-CM

## 2019-04-02 DIAGNOSIS — M25532 Pain in left wrist: Secondary | ICD-10-CM

## 2019-04-02 NOTE — Progress Notes (Signed)
[facility]   Established Patient Office Visit  Subjective:  Patient ID: Felicia Collier, female    DOB: 12/13/1955  Age: 63 y.o. MRN: [id number]  CC:  Chief Complaint  Patient presents with  . Follow-up    HPI Felicia Collier is a 63 year old female who presents for Follow Up today.   Past Medical History:  Diagnosis Date  . Abdominal pain   . Chronic back pain   . Chronic knee pain   . Diverticulitis   . Schizophrenia (HCC)    Current Status: Since her last office visit, she is doing well with no complaints. She continues to refuse to take any of her medications. She continues to follow up with her Counselor as needed. Her anxiety and confusion is increased  today. She has irregular thoughts and speech today. She is appears very uncomfortable and anxious today. She admits to visual hallucinations and auditory hallucinations today. She denies suicidal ideations, and homicidal ideations. She states that she does not want to take anymore medications.  She denies fevers, chills, fatigue, recent infections, weight loss, and night sweats. She has not had any headaches, visual changes, dizziness, and falls. No chest pain, heart palpitations, cough and shortness of breath reported. No reports of GI problems such as nausea, vomiting, diarrhea, and constipation. She has no reports of blood in stools, dysuria and hematuria.  Past Surgical History:  Procedure Laterality Date  . ABDOMINAL HYSTERECTOMY    . CHOLECYSTECTOMY    . KNEE SURGERY Left   . WRIST SURGERY Left     Family History  Problem Relation Age of Onset  . Other Mother        no health conditions per patient  . Other Father        no health conditions per patient    Social History   Socioeconomic History  . Marital status: Single    Spouse name: Not on file  . Number of children: Not on file  . Years of education: Not on file  . Highest education level: Not on file   Occupational History  . Not on file  Social Needs  . Financial resource strain: Not on file  . Food insecurity:    Worry: Not on file    Inability: Not on file  . Transportation needs:    Medical: Not on file    Non-medical: Not on file  Tobacco Use  . Smoking status: Never Smoker  . Smokeless tobacco: Never Used  Substance and Sexual Activity  . Alcohol use: No  . Drug use: No  . Sexual activity: Not Currently  Lifestyle  . Physical activity:    Days per week: Not on file    Minutes per session: Not on file  . Stress: Not on file  Relationships  . Social connections:    Talks on phone: Not on file    Gets together: Not on file    Attends religious service: Not on file    Active member of club or organization: Not on file    Attends meetings of clubs or organizations: Not on file    Relationship status: Not on file  . Intimate partner violence:    Fear of current or ex partner: Not on file    Emotionally abused: Not on file    Physically abused: Not on file    Forced sexual activity: Not on file  Other Topics Concern  . Not  on file  Social History Narrative  . Not on file    Outpatient Medications Prior to Visit  Medication Sig Dispense Refill  . gabapentin (NEURONTIN) 100 MG capsule Take 1 capsule (100 mg total) by mouth 3 (three) times daily. (Patient not taking: Reported on 03/23/2019) 21 capsule 0  . meloxicam (MOBIC) 15 MG tablet Take 1 tablet daily as needed for pain. (Patient not taking: Reported on 03/07/2019) 30 tablet 0  . omeprazole (PRILOSEC) 20 MG capsule Take 1 capsule every morning at least 30 minutes before first dose of Carafate. (Patient not taking: Reported on 03/07/2019) 30 capsule 0  . sucralfate (CARAFATE) 1 g tablet Take 1 tablet (1 g total) by mouth 4 (four) times daily. (Patient not taking: Reported on 03/07/2019) 40 tablet 0  . tiZANidine (ZANAFLEX) 4 MG capsule Take 1 capsule (4 mg total) by mouth 2 (two) times daily as needed for muscle spasms.  (Patient not taking: Reported on 03/23/2019) 30 capsule 2   No facility-administered medications prior to visit.     Allergies  Allergen Reactions  . Lidocaine     Face swelling     ROS Review of Systems  Constitutional: Negative.   HENT: Negative.   Eyes: Negative.   Respiratory: Negative.   Cardiovascular: Negative.   Gastrointestinal: Negative.   Endocrine: Negative.   Genitourinary: Negative.   Musculoskeletal: Positive for arthralgias (Generalized chronic pain).  Skin: Negative.   Allergic/Immunologic: Negative.   Neurological: Negative.   Hematological: Negative.   Psychiatric/Behavioral: Positive for agitation, behavioral problems, confusion, dysphoric mood and sleep disturbance. The patient is nervous/anxious and is hyperactive.       Objective:    Physical Exam  Constitutional: She is oriented to person, place, and time. She appears well-developed and well-nourished.  HENT:  Head: Normocephalic and atraumatic.  Eyes: Conjunctivae are normal.  Neck: Normal range of motion. Neck supple.  Cardiovascular: Normal rate, regular rhythm, normal heart sounds and intact distal pulses.  Pulmonary/Chest: Effort normal and breath sounds normal.  Abdominal: Soft. Bowel sounds are normal.  Musculoskeletal: Normal range of motion.  Neurological: She is alert and oriented to person, place, and time. She has normal reflexes.  Skin: Skin is warm and dry.  Psychiatric:  Hyperactive, confused, irrational thoughts, delusions.     BP 120/70 (BP Location: Left Arm, Patient Position: Sitting, Cuff Size: Large)   Pulse 80   Temp 98 F (36.7 C) (Oral)   Ht 5\' 6"  (1.676 m)   Wt 166 lb (75.3 kg)   SpO2 98%   BMI 26.79 kg/m  Wt Readings from Last 3 Encounters:  04/02/19 166 lb (75.3 kg)  03/13/19 163 lb 9.3 oz (74.2 kg)  03/07/19 163 lb 9.6 oz (74.2 kg)     Health Maintenance Due  Topic Date Due  . COLONOSCOPY  03/11/2007  . MAMMOGRAM  02/23/2019    There are no  preventive care reminders to display for this patient.  Lab Results  Component Value Date   TSH 0.99 11/22/2016   Lab Results  Component Value Date   WBC 10.9 (H) 03/13/2019   HGB 12.9 03/13/2019   HCT 42.8 03/13/2019   MCV 86.5 03/13/2019   PLT 317 03/13/2019   Lab Results  Component Value Date   NA 138 03/13/2019   K 3.6 03/13/2019   CO2 26 03/13/2019   GLUCOSE 95 03/13/2019   BUN 13 03/13/2019   CREATININE 0.66 03/13/2019   BILITOT 0.6 03/13/2019   ALKPHOS 94 03/13/2019  AST 18 03/13/2019   ALT 17 03/13/2019   PROT 7.7 03/13/2019   ALBUMIN 4.1 03/13/2019   CALCIUM 9.0 03/13/2019   ANIONGAP 9 03/13/2019   Lab Results  Component Value Date   CHOL 200 05/24/2016   Lab Results  Component Value Date   HDL 56 05/24/2016   Lab Results  Component Value Date   LDLCALC 122 05/24/2016   Lab Results  Component Value Date   TRIG 109 05/24/2016   Lab Results  Component Value Date   CHOLHDL 3.6 05/24/2016   Lab Results  Component Value Date   HGBA1C 5.6 06/30/2018   Assessment & Plan:   1. Undifferentiated schizophrenia (HCC) Continues to refuse treatment.   2. Manic behavior (HCC) Moderate today.   3. Patient refuses to take medication Patient is experiencing chronic pain, hallucinations as r/t diagnosis of schizophrenia, but she is refusing to take medications. She continues to follow up with her counselor at [facility].   4. Chronic pain of left knee  5. Pain in left wrist  6. Chronic pain syndrome  7. Auditory hallucinations  8. Visual hallucinations  9. Follow up She will follow up in 3 months.   No orders of the defined types were placed in this encounter.   No orders of the defined types were placed in this encounter.   Referral Orders  No referral(s) requested today    Raliegh IpNatalie Romani Wilbon,  MSN, FNP-C [facility] [address]  [place], [place] [id number] [phone]    Problem List  Items Addressed This Visit      Other   Chronic pain of left knee   Pain in left wrist   Schizophrenia (HCC) - Primary    Other Visit Diagnoses    Manic behavior (HCC)       Patient refuses to take medication       Chronic pain syndrome       Follow up          No orders of the defined types were placed in this encounter.   Follow-up: Return in about 3 months (around 07/03/2019).    Kallie LocksNatalie M Zamorah Ailes, FNP

## 2019-04-19 ENCOUNTER — Other Ambulatory Visit: Payer: Self-pay

## 2019-04-19 DIAGNOSIS — R0789 Other chest pain: Secondary | ICD-10-CM | POA: Diagnosis not present

## 2019-04-19 DIAGNOSIS — Z79899 Other long term (current) drug therapy: Secondary | ICD-10-CM | POA: Insufficient documentation

## 2019-04-19 DIAGNOSIS — M549 Dorsalgia, unspecified: Secondary | ICD-10-CM | POA: Diagnosis not present

## 2019-04-19 DIAGNOSIS — M2550 Pain in unspecified joint: Secondary | ICD-10-CM | POA: Insufficient documentation

## 2019-04-19 DIAGNOSIS — R109 Unspecified abdominal pain: Secondary | ICD-10-CM | POA: Diagnosis not present

## 2019-04-20 ENCOUNTER — Emergency Department (HOSPITAL_COMMUNITY)
Admission: EM | Admit: 2019-04-20 | Discharge: 2019-04-20 | Disposition: A | Discharge status: Home / Self Care | Payer: Medicaid Other | Attending: Emergency Medicine | Admitting: Emergency Medicine

## 2019-04-20 ENCOUNTER — Encounter (HOSPITAL_COMMUNITY): Payer: Self-pay | Admitting: Emergency Medicine

## 2019-04-20 ENCOUNTER — Emergency Department (HOSPITAL_COMMUNITY): Payer: Medicaid Other

## 2019-04-20 ENCOUNTER — Other Ambulatory Visit: Payer: Self-pay

## 2019-04-20 DIAGNOSIS — R0789 Other chest pain: Secondary | ICD-10-CM

## 2019-04-20 DIAGNOSIS — M546 Pain in thoracic spine: Secondary | ICD-10-CM

## 2019-04-20 DIAGNOSIS — G8929 Other chronic pain: Secondary | ICD-10-CM

## 2019-04-20 DIAGNOSIS — R109 Unspecified abdominal pain: Secondary | ICD-10-CM

## 2019-04-20 LAB — CBC WITH DIFFERENTIAL/PLATELET
Abs Immature Granulocytes: 0.05 10*3/uL (ref 0.00–0.07)
Basophils Absolute: 0 10*3/uL (ref 0.0–0.1)
Basophils Relative: 0 %
Eosinophils Absolute: 0.3 10*3/uL (ref 0.0–0.5)
Eosinophils Relative: 3 %
HCT: 43.4 % (ref 36.0–46.0)
Hemoglobin: 12.9 g/dL (ref 12.0–15.0)
Immature Granulocytes: 1 %
Lymphocytes Relative: 24 %
Lymphs Abs: 2.2 10*3/uL (ref 0.7–4.0)
MCH: 25.5 pg — ABNORMAL LOW (ref 26.0–34.0)
MCHC: 29.7 g/dL — ABNORMAL LOW (ref 30.0–36.0)
MCV: 85.8 fL (ref 80.0–100.0)
Monocytes Absolute: 0.8 10*3/uL (ref 0.1–1.0)
Monocytes Relative: 9 %
Neutro Abs: 5.9 10*3/uL (ref 1.7–7.7)
Neutrophils Relative %: 63 %
Platelets: 315 10*3/uL (ref 150–400)
RBC: 5.06 MIL/uL (ref 3.87–5.11)
RDW: 13.7 % (ref 11.5–15.5)
WBC: 9.4 10*3/uL (ref 4.0–10.5)
nRBC: 0 % (ref 0.0–0.2)

## 2019-04-20 LAB — COMPREHENSIVE METABOLIC PANEL
ALT: 17 U/L (ref 0–44)
AST: 18 U/L (ref 15–41)
Albumin: 3.9 g/dL (ref 3.5–5.0)
Alkaline Phosphatase: 89 U/L (ref 38–126)
Anion gap: 7 (ref 5–15)
BUN: 10 mg/dL (ref 8–23)
CO2: 23 mmol/L (ref 22–32)
Calcium: 8.8 mg/dL — ABNORMAL LOW (ref 8.9–10.3)
Chloride: 107 mmol/L (ref 98–111)
Creatinine, Ser: 0.64 mg/dL (ref 0.44–1.00)
GFR calc Af Amer: >60 mL/min (ref >60)
GFR calc non Af Amer: >60 mL/min (ref >60)
Glucose, Bld: 101 mg/dL — ABNORMAL HIGH (ref 70–99)
Potassium: 3.5 mmol/L (ref 3.5–5.1)
Sodium: 137 mmol/L (ref 135–145)
Total Bilirubin: 0.5 mg/dL (ref 0.3–1.2)
Total Protein: 7.4 g/dL (ref 6.5–8.1)

## 2019-04-20 LAB — LIPASE, BLOOD: Lipase: 38 U/L (ref 11–51)

## 2019-04-20 LAB — TROPONIN I: Troponin I: <0.03 ng/mL (ref <0.03)

## 2019-04-20 MED ORDER — IBUPROFEN 800 MG PO TABS
800.0000 mg | ORAL_TABLET | Freq: Once | ORAL | Status: AC
  Administered 2019-04-20: 01:00:00 800 mg via ORAL
  Filled 2019-04-20: qty 1

## 2019-04-20 NOTE — ED Triage Notes (Signed)
Patient is complaining of back pain and right/left hand pain. Patient could not tell me when she started having pain.

## 2019-04-20 NOTE — Discharge Instructions (Addendum)
Your labs, EKG, chest x-ray today were normal.  You may alternate Tylenol and ibuprofen over-the-counter for your chronic pain.  You may alternate Tylenol 1000 mg every 6 hours as needed for pain and Ibuprofen 800 mg every 8 hours as needed for pain.  Please take Ibuprofen with food.

## 2019-04-20 NOTE — ED Notes (Signed)
Pt given call bell and knows to call staff if assistance is needed.

## 2019-04-20 NOTE — ED Provider Notes (Signed)
TIME SEEN: 12:26 AM  CHIEF COMPLAINT: Multiple complaints  HPI: Patient is a 63 year old female with history of schizophrenia who presents the emergency department with multiple complaints.  She is an extremely poor historian.  She states that she has had upper back pain that radiates into her neck and chest intermittently for 2 months.  Pain is worse with palpation.  No injury that she can recall.  She denies any chest pain currently.  She is unsure of when the last time she had chest pain but thinks it may have been a month ago.  No shortness of breath currently but states she has had a cough.  She also complains of intermittent abdominal pain.  She denies any vomiting but states she has had nausea and diarrhea after eating but this has also been chronic.  She also complains of left hand and wrist pain which is chronic and bilateral ankle pain which is chronic.  No injury to the extremities.  Has chronic numbness of the left hand after carpal tunnel surgery but denies any other numbness or weakness.  No bowel or bladder incontinence.  ROS: See HPI Constitutional: no fever  Eyes: no drainage  ENT: no runny nose   Cardiovascular:   chest pain  Resp: no SOB  GI: no vomiting GU: no dysuria Integumentary: no rash  Allergy: no hives  Musculoskeletal: no leg swelling  Neurological: no slurred speech ROS otherwise negative  PAST MEDICAL HISTORY/PAST SURGICAL HISTORY:  Past Medical History:  Diagnosis Date  . Abdominal pain   . Chronic back pain   . Chronic knee pain   . Diverticulitis   . Schizophrenia (HCC)     MEDICATIONS:  Prior to Admission medications   Medication Sig Start Date End Date Taking? Authorizing Provider  gabapentin (NEURONTIN) 100 MG capsule Take 1 capsule (100 mg total) by mouth 3 (three) times daily. Patient not taking: Reported on 03/23/2019 03/15/19   Raeford Razor, MD  meloxicam (MOBIC) 15 MG tablet Take 1 tablet daily as needed for pain. Patient not taking:  Reported on 03/07/2019 03/01/19   Molpus, Jonny Ruiz, MD  omeprazole (PRILOSEC) 20 MG capsule Take 1 capsule every morning at least 30 minutes before first dose of Carafate. Patient not taking: Reported on 03/07/2019 03/01/19   Molpus, John, MD  sucralfate (CARAFATE) 1 g tablet Take 1 tablet (1 g total) by mouth 4 (four) times daily. Patient not taking: Reported on 03/07/2019 03/01/19   Molpus, Jonny Ruiz, MD  tiZANidine (ZANAFLEX) 4 MG capsule Take 1 capsule (4 mg total) by mouth 2 (two) times daily as needed for muscle spasms. Patient not taking: Reported on 03/23/2019 03/07/19   Kallie Locks, FNP    ALLERGIES:  Allergies  Allergen Reactions  . Lidocaine     Face swelling     SOCIAL HISTORY:  Social History   Tobacco Use  . Smoking status: Never Smoker  . Smokeless tobacco: Never Used  Substance Use Topics  . Alcohol use: No    FAMILY HISTORY: Family History  Problem Relation Age of Onset  . Other Mother        no health conditions per patient  . Other Father        no health conditions per patient    EXAM: BP 134/74 (BP Location: Left Arm)   Pulse 73   Temp 98.9 F (37.2 C) (Oral)   Resp 16   Ht  (1.676 m)   Wt 73.9 kg   SpO2 100%  BMI 26.31 kg/m  CONSTITUTIONAL: Alert and oriented and responds appropriately to questions.  Chronically ill-appearing, very poor historian HEAD: Normocephalic EYES: Conjunctivae clear, pupils appear equal, EOMI ENT: normal nose; moist mucous membranes NECK: Supple, no meningismus, no nuchal rigidity, no LAD  CARD: RRR; S1 and S2 appreciated; no murmurs, no clicks, no rubs, no gallops RESP: Normal chest excursion without splinting or tachypnea; breath sounds clear and equal bilaterally; no wheezes, no rhonchi, no rales, no hypoxia or respiratory distress, speaking full sentences ABD/GI: Normal bowel sounds; non-distended; soft, non-tender, no rebound, no guarding, no peritoneal signs, no hepatosplenomegaly BACK:  The back appears normal and  is tender to palpation over the thoracic paraspinal muscles, no midline spinal tenderness or step-off or deformity, no redness or warmth, no ecchymosis or swelling, no rash or other lesions EXT: Normal ROM in all joints; non-tender to palpation; no edema; normal capillary refill; no cyanosis, no calf tenderness or swelling, no deformity noted to her extremities, no significant tenderness over the bilateral ankles or left wrist, no redness or warmth, no joint effusion, 2+ DP pulses and radial pulses bilaterally    SKIN: Normal color for age and race; warm; no rash NEURO: Moves all extremities equally, able to ambulate with normal gait, sensation to light touch intact diffusely without saddle anesthesia PSYCH: Bizarre affect.  No SI or HI.  No hallucinations or delusions.  MEDICAL DECISION MAKING: Patient here with multiple complaints.  Patient is a very poor historian and I think that this is related to her schizophrenia.  There is no obvious sign of psychiatric safety concern at this time.  She does not appear delusional, psychotic currently.  I suspect that her chest pain is atypical and chronic but given I am unable to get a great history from her will obtain a troponin, EKG and chest x-ray.  She also complains of abdominal pain but her abdominal exam is benign.  Complains of left wrist pain and bilateral ankle pain but there is no sign of deformity and she denies any injury.  Doubt fracture as of cellulitis, septic arthritis, gout, DVT, arterial obstruction.  Recommended Tylenol and Motrin for her back pain.  She has no midline tenderness on exam and no focal neurologic deficits.  Doubt cauda equina, spinal stenosis, epidural abscess or hematoma, discitis or osteomyelitis, transverse myelitis.  I do not feel she needs emergent imaging of her back today.  ED PROGRESS: Patient's labs are unremarkable including normal electrolytes, renal function, blood counts, LFTs, lipase and a negative troponin.  Chest  x-ray is clear.  EKG shows no ischemic abnormality or arrhythmia.  Low suspicion for ACS, PE, dissection, appendicitis, colitis, diverticulitis, cholecystitis, pancreatitis, neurosurgical emergency.  I feel she can be discharged home and alternate Tylenol and ibuprofen for chronic pain.  At this time, I do not feel there is any life-threatening condition present. I have reviewed and discussed all results (EKG, imaging, lab, urine as appropriate) and exam findings with patient/family. I have reviewed nursing notes and appropriate previous records.  I feel the patient is safe to be discharged home without further emergent workup and can continue workup as an outpatient as needed. Discussed usual and customary return precautions. Patient/family verbalize understanding and are comfortable with this plan.  Outpatient follow-up has been provided as needed. All questions have been answered.     EKG Interpretation  Date/Time:  Friday April 20 2019 00:52:55 EDT Ventricular Rate:  54 PR Interval:    QRS Duration: 93 QT Interval:  451  QTC Calculation: 428 R Axis:   22 Text Interpretation:  Sinus rhythm No significant change since last tracing Confirmed by , Baxter HireKristen [phone]([id number]) on 04/20/2019 1:05:08 AM          , Layla MawKristen N, DO 04/20/19 [id number]

## 2019-04-20 NOTE — ED Notes (Signed)
Pt changed out of gown and wants to stay in their clothes.

## 2019-05-09 ENCOUNTER — Encounter (HOSPITAL_COMMUNITY): Payer: Self-pay

## 2019-05-09 ENCOUNTER — Other Ambulatory Visit: Payer: Self-pay

## 2019-05-09 ENCOUNTER — Emergency Department (HOSPITAL_COMMUNITY)
Admission: EM | Admit: 2019-05-09 | Discharge: 2019-05-09 | Disposition: A | Discharge status: Home / Self Care | Payer: Medicaid Other | Attending: Emergency Medicine | Admitting: Emergency Medicine

## 2019-05-09 DIAGNOSIS — Z5321 Procedure and treatment not carried out due to patient leaving prior to being seen by health care provider: Secondary | ICD-10-CM | POA: Insufficient documentation

## 2019-05-09 DIAGNOSIS — R109 Unspecified abdominal pain: Secondary | ICD-10-CM | POA: Diagnosis present

## 2019-05-09 MED ORDER — SODIUM CHLORIDE 0.9% FLUSH: 3.0000 mL | Freq: Once | INTRAVENOUS | Status: DC

## 2019-05-09 NOTE — ED Notes (Signed)
Called for room no answer

## 2019-05-09 NOTE — ED Triage Notes (Signed)
Patient c/o mid abdominal pain and diarrhea since getting her partial plate yesterday. Patient then began saying she had bilateral hip, headache, muscle spasms,etc. Patient had multiple complaints.

## 2019-05-09 NOTE — ED Notes (Signed)
No answer for lab draw at this time.

## 2019-05-11 ENCOUNTER — Other Ambulatory Visit: Payer: Self-pay

## 2019-05-11 ENCOUNTER — Ambulatory Visit (INDEPENDENT_AMBULATORY_CARE_PROVIDER_SITE_OTHER): Payer: Medicaid Other | Admitting: Family Medicine

## 2019-05-11 ENCOUNTER — Encounter: Payer: Self-pay | Admitting: Family Medicine

## 2019-05-11 VITALS — BP 126/76 | HR 70 | Temp 98.1°F | Ht 66.0 in | Wt 165.0 lb

## 2019-05-11 DIAGNOSIS — M545 Low back pain, unspecified: Secondary | ICD-10-CM

## 2019-05-11 DIAGNOSIS — M25562 Pain in left knee: Secondary | ICD-10-CM

## 2019-05-11 DIAGNOSIS — G8929 Other chronic pain: Secondary | ICD-10-CM

## 2019-05-11 DIAGNOSIS — F3010 Manic episode without psychotic symptoms, unspecified: Secondary | ICD-10-CM

## 2019-05-11 DIAGNOSIS — Z5320 Procedure and treatment not carried out because of patient's decision for unspecified reasons: Secondary | ICD-10-CM

## 2019-05-11 DIAGNOSIS — R1084 Generalized abdominal pain: Secondary | ICD-10-CM

## 2019-05-11 DIAGNOSIS — Z09 Encounter for follow-up examination after completed treatment for conditions other than malignant neoplasm: Secondary | ICD-10-CM

## 2019-05-11 DIAGNOSIS — F909 Attention-deficit hyperactivity disorder, unspecified type: Secondary | ICD-10-CM | POA: Insufficient documentation

## 2019-05-11 DIAGNOSIS — G894 Chronic pain syndrome: Secondary | ICD-10-CM | POA: Diagnosis not present

## 2019-05-11 LAB — POCT URINALYSIS DIP (MANUAL ENTRY)
Bilirubin, UA: NEGATIVE
Blood, UA: NEGATIVE
Glucose, UA: NEGATIVE mg/dL
Ketones, POC UA: NEGATIVE mg/dL
Leukocytes, UA: NEGATIVE
Nitrite, UA: NEGATIVE
Protein Ur, POC: NEGATIVE mg/dL
Spec Grav, UA: 1.025 (ref 1.010–1.025)
Urobilinogen, UA: 1 E.U./dL
pH, UA: 6 (ref 5.0–8.0)

## 2019-05-11 NOTE — Progress Notes (Signed)
[facility]   Hospital Follow Up  Subjective:  Patient ID: Felicia Collier, female    DOB: [birth year]  Age: 63 y.o. MRN: [id number]  CC:  Chief Complaint  Patient presents with  . Abdominal Pain  . Back Pain    upper back     HPI Felicia Collier is a 63 year old female who presents for Hospital Follow Up today.   Past Medical History:  Diagnosis Date  . Abdominal pain   . Chronic back pain   . Chronic knee pain   . Diverticulitis   . Schizophrenia (HCC)    Current Status: Since her last office visit, she has had several ED visit for back pain. Today she states that she is doing well but she continues to have mild neck pain, which she takes Acetaminophen for relief. She does not like the side effects of drowsiness. She recently spoke with her Psychiatrist via phone. Her last appointment was 04/18/2019. She continues to refuse to take psychiatric medications at this time. She denies suicidal ideations, homicidal ideations, or auditory hallucinations. She states that recently got her lower dentures, which she is pleased with. She is currently waiting on her upper partial.   She denies fevers, chills, fatigue, recent infections, weight loss, and night sweats. She has not had any headaches, visual changes, dizziness, and falls. No chest pain, heart palpitations, cough and shortness of breath reported. No reports of GI problems such as nausea, vomiting, diarrhea, and constipation. She has no reports of blood in stools, dysuria and hematuria.   Past Surgical History:  Procedure Laterality Date  . ABDOMINAL HYSTERECTOMY    . CHOLECYSTECTOMY    . KNEE SURGERY Left   . WRIST SURGERY Left     Family History  Problem Relation Age of Onset  . Other Mother        no health conditions per patient  . Other Father        no health conditions per patient    Social History   Socioeconomic History  . Marital status: Single    Spouse name:  Not on file  . Number of children: Not on file  . Years of education: Not on file  . Highest education level: Not on file  Occupational History  . Not on file  Social Needs  . Financial resource strain: Not on file  . Food insecurity    Worry: Not on file    Inability: Not on file  . Transportation needs    Medical: Not on file    Non-medical: Not on file  Tobacco Use  . Smoking status: Never Smoker  . Smokeless tobacco: Never Used  Substance and Sexual Activity  . Alcohol use: No  . Drug use: No  . Sexual activity: Not Currently  Lifestyle  . Physical activity    Days per week: Not on file    Minutes per session: Not on file  . Stress: Not on file  Relationships  . Social Musicianconnections    Talks on phone: Not on file    Gets together: Not on file    Attends religious service: Not on file    Active member of club or organization: Not on file    Attends meetings of clubs or organizations: Not on file    Relationship status: Not on file  . Intimate partner violence    Fear of current or ex partner: Not on file  Emotionally abused: Not on file    Physically abused: Not on file    Forced sexual activity: Not on file  Other Topics Concern  . Not on file  Social History Narrative  . Not on file    Outpatient Medications Prior to Visit  Medication Sig Dispense Refill  . gabapentin (NEURONTIN) 100 MG capsule Take 1 capsule (100 mg total) by mouth 3 (three) times daily. (Patient not taking: Reported on 03/23/2019) 21 capsule 0  . meloxicam (MOBIC) 15 MG tablet Take 1 tablet daily as needed for pain. (Patient not taking: Reported on 03/07/2019) 30 tablet 0  . omeprazole (PRILOSEC) 20 MG capsule Take 1 capsule every morning at least 30 minutes before first dose of Carafate. (Patient not taking: Reported on 03/07/2019) 30 capsule 0  . sucralfate (CARAFATE) 1 g tablet Take 1 tablet (1 g total) by mouth 4 (four) times daily. (Patient not taking: Reported on 03/07/2019) 40 tablet 0  .  tiZANidine (ZANAFLEX) 4 MG capsule Take 1 capsule (4 mg total) by mouth 2 (two) times daily as needed for muscle spasms. (Patient not taking: Reported on 03/23/2019) 30 capsule 2   No facility-administered medications prior to visit.     Allergies  Allergen Reactions  . Lidocaine     Face swelling     ROS Review of Systems  Constitutional: Negative.   HENT: Negative.   Eyes: Negative.   Respiratory: Negative.   Cardiovascular: Negative.   Gastrointestinal: Negative.   Endocrine: Negative.   Genitourinary: Negative.   Musculoskeletal:       Generalized  Skin: Negative.   Allergic/Immunologic: Negative.   Neurological: Negative.   Hematological: Negative.   Psychiatric/Behavioral: The patient is nervous/anxious and is hyperactive.     Objective:    Physical Exam  Constitutional: She appears well-developed and well-nourished.    BP 126/76 (BP Location: Left Arm, Patient Position: Sitting, Cuff Size: Small)   Pulse 70   Temp 98.1 F (36.7 C) (Oral)   Ht 5\' 6"  (1.676 m)   Wt 165 lb (74.8 kg)   SpO2 95%   BMI 26.63 kg/m  Wt Readings from Last 3 Encounters:  05/11/19 165 lb (74.8 kg)  05/09/19 163 lb (73.9 kg)  04/20/19 163 lb (73.9 kg)     Health Maintenance Due  Topic Date Due  . COLONOSCOPY  03/11/2007  . MAMMOGRAM  02/23/2019    There are no preventive care reminders to display for this patient.  Lab Results  Component Value Date   TSH 0.99 11/22/2016   Lab Results  Component Value Date   WBC 9.4 04/20/2019   HGB 12.9 04/20/2019   HCT 43.4 04/20/2019   MCV 85.8 04/20/2019   PLT 315 04/20/2019   Lab Results  Component Value Date   NA 137 04/20/2019   K 3.5 04/20/2019   CO2 23 04/20/2019   GLUCOSE 101 (H) 04/20/2019   BUN 10 04/20/2019   CREATININE 0.64 04/20/2019   BILITOT 0.5 04/20/2019   ALKPHOS 89 04/20/2019   AST 18 04/20/2019   ALT 17 04/20/2019   PROT 7.4 04/20/2019   ALBUMIN 3.9 04/20/2019   CALCIUM 8.8 (L) 04/20/2019   ANIONGAP  7 04/20/2019   Lab Results  Component Value Date   CHOL 200 05/24/2016   Lab Results  Component Value Date   HDL 56 05/24/2016   Lab Results  Component Value Date   LDLCALC 122 05/24/2016   Lab Results  Component Value Date   TRIG 109  05/24/2016   Lab Results  Component Value Date   CHOLHDL 3.6 05/24/2016   Lab Results  Component Value Date   HGBA1C 5.6 06/30/2018      Assessment & Plan:   1. Hospital follow up  2. Abdominal discomfort, generalized - POCT urinalysis dipstick  3. Chronic pain of left knee  4. Chronic pain syndrome  5. Hyperactive behavior Displays rapid speech today.   6. Low back pain, unspecified back pain laterality, unspecified chronicity, unspecified whether sciatica present Moderate today. She will continue to take Acetaminophen as needed.   7. Manic behavior (HCC) Mild today.   8. Patient refuses to take medication Continues to refuse to take psychiatric medications and medications prescribed for pain, except Acetaminophen occasionally.   9. Follow up She will keep follow up appointment 06/2019.  No orders of the defined types were placed in this encounter.   Orders Placed This Encounter  Procedures  . POCT urinalysis dipstick    Referral Orders  No referral(s) requested today    Raliegh IpNatalie Radford Pease,  MSN, FNP-BC [facility] [address]  [place], [place] [id number] [phone]    Problem List Items Addressed This Visit      Other   Chronic pain of left knee   Low back pain   Manic behavior (HCC)   Patient refuses to take medication    Other Visit Diagnoses    Hospital discharge follow-up    -  Primary   Abdominal discomfort, generalized       Relevant Orders   POCT urinalysis dipstick (Completed)   Chronic pain syndrome       Hyperactive behavior       Follow up          No orders of the defined types were placed in this encounter.   Follow-up: No follow-ups on file.     Kallie LocksNatalie M Leelan Rajewski, FNP

## 2019-05-20 ENCOUNTER — Encounter (HOSPITAL_COMMUNITY): Payer: Self-pay

## 2019-05-20 ENCOUNTER — Emergency Department (HOSPITAL_COMMUNITY)
Admission: EM | Admit: 2019-05-20 | Discharge: 2019-05-20 | Disposition: A | Discharge status: Home / Self Care | Payer: Medicaid Other | Attending: Emergency Medicine | Admitting: Emergency Medicine

## 2019-05-20 ENCOUNTER — Other Ambulatory Visit: Payer: Self-pay

## 2019-05-20 DIAGNOSIS — L299 Pruritus, unspecified: Secondary | ICD-10-CM | POA: Insufficient documentation

## 2019-05-20 DIAGNOSIS — R21 Rash and other nonspecific skin eruption: Secondary | ICD-10-CM | POA: Diagnosis present

## 2019-05-20 MED ORDER — LORATADINE 10 MG PO TABS
10.0000 mg | ORAL_TABLET | Freq: Once | ORAL | Status: AC
  Administered 2019-05-20: 10 mg via ORAL
  Filled 2019-05-20: qty 1

## 2019-05-20 MED ORDER — LORATADINE 10 MG PO TABS: 10.0000 mg | ORAL_TABLET | Freq: Every day | ORAL | 0 refills | Status: DC

## 2019-05-20 NOTE — ED Triage Notes (Signed)
Pt arrived with complaints of upper back irritation that itches and burns over the last week, pt reports it progressively getting worse. Pt denies any changes in soap or laundry detergent. No redness or rash noted.

## 2019-05-20 NOTE — ED Provider Notes (Signed)
[facility]   CSN: [id number] Arrival date & time: 05/20/19  0256     History   Chief Complaint Chief Complaint  Patient presents with  . Rash    Upper back     HPI Felicia Collier is a 63 y.o. female.     The history is provided by the patient.  Rash Location: itching without lesions of the upper back between the shoulders and base of the neck. Quality: itchiness   Severity:  Mild Onset quality:  Gradual Timing:  Constant Progression:  Unchanged Chronicity:  Chronic Context: not hot tub use, not insect bite/sting, not medications, not new detergent/soap and not nuts   Relieved by:  Nothing Worsened by:  Nothing Ineffective treatments:  None tried Associated symptoms: no abdominal pain, no diarrhea, no fatigue, no fever, no headaches, no hoarse voice, no induration, no joint pain, no myalgias, no nausea, no periorbital edema, no shortness of breath, no sore throat, no throat swelling, no tongue swelling, no URI, not vomiting and not wheezing     Past Medical History:  Diagnosis Date  . Abdominal pain   . Chronic back pain   . Chronic knee pain   . Diverticulitis   . Schizophrenia [facility](HCC)     Patient Active Problem List   Diagnosis Date Noted  . Abdominal discomfort, generalized 05/11/2019  . Hyperactive behavior 05/11/2019  . Manic behavior (HCC) 04/02/2019  . Patient refuses to take medication 04/02/2019  . Auditory hallucinations 04/02/2019  . Visual hallucinations 04/02/2019  . Carpal tunnel syndrome of left wrist 01/16/2019  . Low back pain 01/01/2019  . Diarrhea 07/25/2018  . Pain in left wrist 07/20/2017  . Schizophrenia (HCC) 05/01/2017  . Chronic pain of left knee 02/09/2017  . Unilateral primary osteoarthritis, left knee 02/09/2017  . Neuropathy 11/22/2016  . Gastroesophageal reflux disease without esophagitis 11/22/2016  . Weight gain 11/22/2016    Past Surgical History:  Procedure Laterality  Date  . ABDOMINAL HYSTERECTOMY    . CHOLECYSTECTOMY    . KNEE SURGERY Left   . WRIST SURGERY Left      OB History   No obstetric history on file.      Home Medications    Prior to Admission medications   Medication Sig Start Date End Date Taking? Authorizing Provider  acetaminophen (TYLENOL) 325 MG tablet Take 325 mg by mouth every 6 (six) hours as needed for moderate pain.   Yes [provider]  gabapentin (NEURONTIN) 100 MG capsule Take 1 capsule (100 mg total) by mouth 3 (three) times daily. Patient not taking: Reported on 03/23/2019 03/15/19   Raeford RazorKohut, Stephen, MD  loratadine (CLARITIN) 10 MG tablet Take 1 tablet (10 mg total) by mouth daily. 05/20/19   Jonella Redditt, MD  meloxicam (MOBIC) 15 MG tablet Take 1 tablet daily as needed for pain. Patient not taking: Reported on 03/07/2019 03/01/19   Molpus, Jonny RuizJohn, MD  omeprazole (PRILOSEC) 20 MG capsule Take 1 capsule every morning at least 30 minutes before first dose of Carafate. Patient not taking: Reported on 03/07/2019 03/01/19   Molpus, John, MD  sucralfate (CARAFATE) 1 g tablet Take 1 tablet (1 g total) by mouth 4 (four) times daily. Patient not taking: Reported on 03/07/2019 03/01/19   Molpus, Jonny RuizJohn, MD  tiZANidine (ZANAFLEX) 4 MG capsule Take 1 capsule (4 mg total) by mouth 2 (two) times daily as needed for muscle spasms. Patient not taking: Reported on 03/23/2019 03/07/19   Raliegh IpStroud, Natalie  M, FNP    Family History Family History  Problem Relation Age of Onset  . Other Mother        no health conditions per patient  . Other Father        no health conditions per patient    Social History Social History   Tobacco Use  . Smoking status: Never Smoker  . Smokeless tobacco: Never Used  Substance Use Topics  . Alcohol use: No  . Drug use: No     Allergies   Lidocaine   Review of Systems Review of Systems  Constitutional: Negative for fatigue and fever.  HENT: Negative for drooling, facial swelling, hoarse voice  and sore throat.   Respiratory: Negative for shortness of breath and wheezing.   Gastrointestinal: Negative for abdominal pain, diarrhea, nausea and vomiting.  Musculoskeletal: Negative for arthralgias and myalgias.  Skin: Positive for rash.  Neurological: Negative for headaches.  All other systems reviewed and are negative.    Physical Exam Updated Vital Signs BP 139/72 (BP Location: Right Arm)   Pulse 65   Temp 98.6 F (37 C) (Oral)   Resp 18   Ht 5\' 6"  (1.676 m)   Wt 74.8 kg   SpO2 97%   BMI 26.63 kg/m   Physical Exam Vitals signs and nursing note reviewed.  Constitutional:      General: She is not in acute distress.    Appearance: She is normal weight.  HENT:     Head: Normocephalic and atraumatic.     Nose: Nose normal.     Mouth/Throat:     Mouth: Mucous membranes are moist.     Pharynx: Oropharynx is clear.  Eyes:     Conjunctiva/sclera: Conjunctivae normal.     Pupils: Pupils are equal, round, and reactive to light.  Neck:     Musculoskeletal: Normal range of motion and neck supple.  Cardiovascular:     Rate and Rhythm: Normal rate and regular rhythm.     Pulses: Normal pulses.     Heart sounds: Normal heart sounds.  Pulmonary:     Effort: Pulmonary effort is normal.     Breath sounds: Normal breath sounds. No stridor. No wheezing.  Abdominal:     General: Abdomen is flat. Bowel sounds are normal.     Tenderness: There is no abdominal tenderness.  Musculoskeletal: Normal range of motion.  Skin:    General: Skin is warm.     Capillary Refill: Capillary refill takes less than 2 seconds.     Coloration: Skin is not jaundiced.     Findings: No bruising, erythema, lesion or rash.  Neurological:     General: No focal deficit present.     Mental Status: She is alert and oriented to person, place, and time.     Deep Tendon Reflexes: Reflexes normal.  Psychiatric:        Mood and Affect: Mood normal.        Behavior: Behavior normal.      ED Treatments  / Results  Labs (all labs ordered are listed, but only abnormal results are displayed) Labs Reviewed - No data to display  EKG None  Radiology No results found.  Procedures Procedures (including critical care time)  Medications Ordered in ED Medications  loratadine (CLARITIN) tablet 10 mg (has no administration in time range)     Final Clinical Impressions(s) / ED Diagnoses   Final diagnoses:  Pruritus   Return for intractable cough, coughing up blood,fevers >100.4 unrelieved  by medication, shortness of breath, intractable vomiting, chest pain, shortness of breath, weakness,numbness, changes in speech, facial asymmetry,abdominal pain, passing out,Inability to tolerate liquids or food, cough, altered mental status or any concerns. No signs of systemic illness or infection. The patient is nontoxic-appearing on exam and vital signs are within normal limits.   I have reviewed the triage vital signs and the nursing notes. Pertinent labs &imaging results that were available during my care of the patient were reviewed by me and considered in my medical decision making (see chart for details).  After history, exam, and medical workup I feel the patient has been appropriately medically screened and is safe for discharge home. Pertinent diagnoses were discussed with the patient. Patient was given return precautions ED Discharge Orders         Ordered    loratadine (CLARITIN) 10 MG tablet  Daily     05/20/19 0428           Tniya Bowditch, MD 05/20/19 0430

## 2019-05-20 NOTE — ED Notes (Signed)
Pt irate wanting to know when someone is going to attend to her.

## 2019-06-08 ENCOUNTER — Other Ambulatory Visit: Payer: Self-pay

## 2019-06-08 ENCOUNTER — Emergency Department (HOSPITAL_COMMUNITY)
Admission: EM | Admit: 2019-06-08 | Discharge: 2019-06-08 | Disposition: A | Discharge status: Home / Self Care | Payer: Medicaid Other | Attending: Emergency Medicine | Admitting: Emergency Medicine

## 2019-06-08 ENCOUNTER — Encounter (HOSPITAL_COMMUNITY): Payer: Self-pay | Admitting: Emergency Medicine

## 2019-06-08 DIAGNOSIS — Z5321 Procedure and treatment not carried out due to patient leaving prior to being seen by health care provider: Secondary | ICD-10-CM | POA: Diagnosis not present

## 2019-06-08 DIAGNOSIS — R05 Cough: Secondary | ICD-10-CM | POA: Diagnosis not present

## 2019-06-08 NOTE — ED Notes (Addendum)
Pt stated that she needed to leave by 11pm. Pt stated that she didn't want to wait to be seen by a Physician. Pt left after triage Pt ambulatory leaving triage RN Jenel Lucks made aware

## 2019-06-08 NOTE — ED Notes (Signed)
Felicia Collier stated she can not stay past 2300 and wanted to leave.

## 2019-06-08 NOTE — ED Triage Notes (Signed)
Patient complaining of a cough and wants some medication for it. Patient is not in pain.

## 2019-06-30 ENCOUNTER — Emergency Department (HOSPITAL_COMMUNITY)
Admission: EM | Admit: 2019-06-30 | Discharge: 2019-06-30 | Disposition: A | Discharge status: Home / Self Care | Payer: Medicaid Other | Attending: Emergency Medicine | Admitting: Emergency Medicine

## 2019-06-30 ENCOUNTER — Encounter (HOSPITAL_COMMUNITY): Payer: Self-pay

## 2019-06-30 ENCOUNTER — Other Ambulatory Visit: Payer: Self-pay

## 2019-06-30 DIAGNOSIS — L539 Erythematous condition, unspecified: Secondary | ICD-10-CM | POA: Insufficient documentation

## 2019-06-30 DIAGNOSIS — L299 Pruritus, unspecified: Secondary | ICD-10-CM | POA: Diagnosis not present

## 2019-06-30 DIAGNOSIS — Z5321 Procedure and treatment not carried out due to patient leaving prior to being seen by health care provider: Secondary | ICD-10-CM | POA: Insufficient documentation

## 2019-06-30 NOTE — ED Triage Notes (Signed)
Pt has redness on bilateral hands. Denies pain. Pt states they are itchy.

## 2019-07-03 ENCOUNTER — Ambulatory Visit (INDEPENDENT_AMBULATORY_CARE_PROVIDER_SITE_OTHER): Payer: Medicaid Other | Admitting: Family Medicine

## 2019-07-03 ENCOUNTER — Other Ambulatory Visit: Payer: Self-pay

## 2019-07-03 VITALS — BP 129/71 | HR 86 | Temp 98.1°F | Resp 16 | Wt 169.0 lb

## 2019-07-03 DIAGNOSIS — M545 Low back pain, unspecified: Secondary | ICD-10-CM

## 2019-07-03 DIAGNOSIS — F203 Undifferentiated schizophrenia: Secondary | ICD-10-CM

## 2019-07-03 DIAGNOSIS — Z09 Encounter for follow-up examination after completed treatment for conditions other than malignant neoplasm: Secondary | ICD-10-CM

## 2019-07-03 DIAGNOSIS — M25562 Pain in left knee: Secondary | ICD-10-CM

## 2019-07-03 DIAGNOSIS — R8290 Unspecified abnormal findings in urine: Secondary | ICD-10-CM

## 2019-07-03 DIAGNOSIS — G894 Chronic pain syndrome: Secondary | ICD-10-CM | POA: Diagnosis not present

## 2019-07-03 DIAGNOSIS — R1084 Generalized abdominal pain: Secondary | ICD-10-CM

## 2019-07-03 DIAGNOSIS — N390 Urinary tract infection, site not specified: Secondary | ICD-10-CM

## 2019-07-03 DIAGNOSIS — Z5320 Procedure and treatment not carried out because of patient's decision for unspecified reasons: Secondary | ICD-10-CM

## 2019-07-03 DIAGNOSIS — G8929 Other chronic pain: Secondary | ICD-10-CM

## 2019-07-03 LAB — POCT URINALYSIS DIPSTICK
Bilirubin, UA: NEGATIVE
Glucose, UA: NEGATIVE
Ketones, UA: NEGATIVE
Nitrite, UA: NEGATIVE
Protein, UA: NEGATIVE
Spec Grav, UA: 1.01 (ref 1.010–1.025)
Urobilinogen, UA: 0.2 E.U./dL
pH, UA: 7 (ref 5.0–8.0)

## 2019-07-03 MED ORDER — SULFAMETHOXAZOLE-TRIMETHOPRIM 800-160 MG PO TABS: 1.0000 | ORAL_TABLET | Freq: Two times a day (BID) | ORAL | 0 refills | Status: DC

## 2019-07-03 NOTE — Patient Instructions (Signed)

## 2019-07-03 NOTE — Progress Notes (Signed)
[facility]    Hospital Follow Up  Subjective:  Patient ID: Felicia Collier, female    DOB: Mar 17, 1956  Age: 63 y.o. MRN: [id number]  CC:  Chief Complaint  Patient presents with  . Follow-up    back pain and stomach     HPI Felicia Collier is a 63 year old female who presents for follow up.   Past Medical History:  Diagnosis Date  . Abdominal pain   . Chronic back pain   . Chronic knee pain   . Diverticulitis   . Schizophrenia (HCC)     Current Status: Since her last office visit, she has had multiple ED visits for her Chronic Pain Issues. Today, she has c/o chronic back pain in her back, abdomen, and hands. She is currently taking Acetaminophen for mild relief of her symptoms. She refuses to take prescribed medications for pain, because of the 'drowsy' side effects. She states that she currently sleeps most of the day already. She continues to follow up with Psychiatrist as needed, with her next appointment next month. Her anxiety is mild today. She denies suicidal ideations, homicidal ideations, or auditory hallucinations.  She denies fevers, chills, fatigue, recent infections, weight loss, and night sweats. She has not had any headaches, visual changes, dizziness, and falls. No chest pain, heart palpitations, cough and shortness of breath reported. No reports of GI problems such as nausea, vomiting, diarrhea, and constipation. She has no reports of blood in stools, dysuria and hematuria.  Past Surgical History:  Procedure Laterality Date  . ABDOMINAL HYSTERECTOMY    . CHOLECYSTECTOMY    . KNEE SURGERY Left   . WRIST SURGERY Left     Family History  Problem Relation Age of Onset  . Other Mother        no health conditions per patient  . Other Father        no health conditions per patient    Social History   Socioeconomic History  . Marital status: Single    Spouse name: Not on file  . Number of children: Not on  file  . Years of education: Not on file  . Highest education level: Not on file  Occupational History  . Not on file  Social Needs  . Financial resource strain: Not on file  . Food insecurity    Worry: Not on file    Inability: Not on file  . Transportation needs    Medical: Not on file    Non-medical: Not on file  Tobacco Use  . Smoking status: Never Smoker  . Smokeless tobacco: Never Used  Substance and Sexual Activity  . Alcohol use: No  . Drug use: No  . Sexual activity: Not Currently  Lifestyle  . Physical activity    Days per week: Not on file    Minutes per session: Not on file  . Stress: Not on file  Relationships  . Social Musicianconnections    Talks on phone: Not on file    Gets together: Not on file    Attends religious service: Not on file    Active member of club or organization: Not on file    Attends meetings of clubs or organizations: Not on file    Relationship status: Not on file  . Intimate partner violence    Fear of current or ex partner: Not on file    Emotionally abused: Not on file    Physically abused:  Not on file    Forced sexual activity: Not on file  Other Topics Concern  . Not on file  Social History Narrative  . Not on file    Outpatient Medications Prior to Visit  Medication Sig Dispense Refill  . acetaminophen (TYLENOL) 325 MG tablet Take 325 mg by mouth every 6 (six) hours as needed for moderate pain.    Marland Kitchen. loratadine (CLARITIN) 10 MG tablet Take 1 tablet (10 mg total) by mouth daily. 30 tablet 0  . gabapentin (NEURONTIN) 100 MG capsule Take 1 capsule (100 mg total) by mouth 3 (three) times daily. (Patient not taking: Reported on 03/23/2019) 21 capsule 0  . meloxicam (MOBIC) 15 MG tablet Take 1 tablet daily as needed for pain. (Patient not taking: Reported on 03/07/2019) 30 tablet 0  . omeprazole (PRILOSEC) 20 MG capsule Take 1 capsule every morning at least 30 minutes before first dose of Carafate. (Patient not taking: Reported on 03/07/2019) 30  capsule 0  . sucralfate (CARAFATE) 1 g tablet Take 1 tablet (1 g total) by mouth 4 (four) times daily. (Patient not taking: Reported on 03/07/2019) 40 tablet 0  . tiZANidine (ZANAFLEX) 4 MG capsule Take 1 capsule (4 mg total) by mouth 2 (two) times daily as needed for muscle spasms. (Patient not taking: Reported on 03/23/2019) 30 capsule 2   No facility-administered medications prior to visit.     Allergies  Allergen Reactions  . Lidocaine     Face swelling     ROS Review of Systems  Constitutional: Negative.   HENT: Negative.   Eyes: Negative.   Respiratory: Negative.   Cardiovascular: Negative.   Gastrointestinal: Negative.   Endocrine: Negative.   Genitourinary: Negative.   Musculoskeletal: Positive for arthralgias (generalized chronic joint pain) and back pain (chronic ).  Skin: Negative.   Allergic/Immunologic: Negative.   Neurological: Negative.   Hematological: Negative.   Psychiatric/Behavioral: Negative.       Objective:    Physical Exam  Constitutional: She is oriented to person, place, and time. She appears well-developed and well-nourished.  HENT:  Head: Atraumatic.  Eyes: Conjunctivae are normal.  Neck: Normal range of motion. Neck supple.  Cardiovascular: Normal rate, regular rhythm, normal heart sounds and intact distal pulses.  Pulmonary/Chest: Effort normal and breath sounds normal.  Abdominal: Soft. Bowel sounds are normal.  Musculoskeletal:     Comments: Limited ROM in spine.   Neurological: She is alert and oriented to person, place, and time. She has normal reflexes.  Skin: Skin is warm and dry.  Psychiatric: She has a normal mood and affect. Her behavior is normal. Judgment and thought content normal.  Nursing note and vitals reviewed.   BP 129/71 (BP Location: Left Leg, Patient Position: Sitting, Cuff Size: Normal)   Pulse 86   Temp 98.1 F (36.7 C) (Oral)   Resp 16   Wt 169 lb (76.7 kg)   SpO2 99%   BMI 27.28 kg/m  Wt Readings from Last  3 Encounters:  07/03/19 169 lb (76.7 kg)  06/08/19 165 lb (74.8 kg)  05/20/19 165 lb (74.8 kg)     Health Maintenance Due  Topic Date Due  . COLONOSCOPY  03/11/2007  . MAMMOGRAM  02/23/2019    There are no preventive care reminders to display for this patient.  Lab Results  Component Value Date   TSH 0.99 11/22/2016   Lab Results  Component Value Date   WBC 9.4 04/20/2019   HGB 12.9 04/20/2019   HCT 43.4  04/20/2019   MCV 85.8 04/20/2019   PLT 315 04/20/2019   Lab Results  Component Value Date   NA 137 04/20/2019   K 3.5 04/20/2019   CO2 23 04/20/2019   GLUCOSE 101 (H) 04/20/2019   BUN 10 04/20/2019   CREATININE 0.64 04/20/2019   BILITOT 0.5 04/20/2019   ALKPHOS 89 04/20/2019   AST 18 04/20/2019   ALT 17 04/20/2019   PROT 7.4 04/20/2019   ALBUMIN 3.9 04/20/2019   CALCIUM 8.8 (L) 04/20/2019   ANIONGAP 7 04/20/2019   Lab Results  Component Value Date   CHOL 200 05/24/2016   Lab Results  Component Value Date   HDL 56 05/24/2016   Lab Results  Component Value Date   LDLCALC 122 05/24/2016   Lab Results  Component Value Date   TRIG 109 05/24/2016   Lab Results  Component Value Date   CHOLHDL 3.6 05/24/2016   Lab Results  Component Value Date   HGBA1C 5.6 06/30/2018      Assessment & Plan:   1. Hospital discharge follow-up  2. Undifferentiated schizophrenia (HCC) Stable. She continues to follow up with Psychiatrist as needed.   3. Low back pain, unspecified back pain laterality, unspecified chronicity, unspecified whether sciatica present - Urinalysis Dipstick  4. Chronic pain syndrome  5. Abdominal discomfort, generalized Moderate. She continues OTC pain medication as needed.   6. Chronic pain of left knee  7. Patient refuses to take medication Refuses to take prescribed pain medications.   8. Urinary tract infection without hematuria, site unspecified We will initiate Bactrim today.  - sulfamethoxazole-trimethoprim (BACTRIM DS)  800-160 MG tablet; Take 1 tablet by mouth 2 (two) times daily.  Dispense: 14 tablet; Refill: 0  9. Abnormal urinalysis Results are pending.  - Urine Culture  10. Follow up She will follow up in 3 months.   Meds ordered this encounter  Medications  . sulfamethoxazole-trimethoprim (BACTRIM DS) 800-160 MG tablet    Sig: Take 1 tablet by mouth 2 (two) times daily.    Dispense:  14 tablet    Refill:  0    Orders Placed This Encounter  Procedures  . Urine Culture  . Urinalysis Dipstick    Referral Orders  No referral(s) requested today    Raliegh IpNatalie Lounell Schumacher,  MSN, FNP-BC [facility] [address]  [place], [place] [address] [phone] fax   Meds ordered this encounter  Medications  . sulfamethoxazole-trimethoprim (BACTRIM DS) 800-160 MG tablet    Sig: Take 1 tablet by mouth 2 (two) times daily.    Dispense:  14 tablet    Refill:  0    Orders Placed This Encounter  Procedures  . Urine Culture  . Urinalysis Dipstick    Referral Orders  No referral(s) requested today    Raliegh IpNatalie Reann Dobias,  MSN, FNP-BC [facility] [address]  [place], [place] [address] [phone] fax  Problem List Items Addressed This Visit      Other   Abdominal discomfort, generalized   Chronic pain of left knee   Low back pain - Primary   Relevant Orders   Urinalysis Dipstick (Completed)   Patient refuses to take medication   Schizophrenia [facility](HCC)    Other Visit Diagnoses    Hospital discharge follow-up       Chronic pain syndrome       Urinary tract infection without hematuria, site  unspecified       Relevant Medications   sulfamethoxazole-trimethoprim (BACTRIM DS) 800-160 MG tablet   Abnormal urinalysis       Relevant Orders   Urine Culture   Follow up          Meds ordered this encounter  Medications  .  sulfamethoxazole-trimethoprim (BACTRIM DS) 800-160 MG tablet    Sig: Take 1 tablet by mouth 2 (two) times daily.    Dispense:  14 tablet    Refill:  0    Follow-up: Return in about 3 months (around 10/03/2019).    Azzie Glatter, FNP

## 2019-07-05 LAB — URINE CULTURE

## 2019-07-07 DIAGNOSIS — G894 Chronic pain syndrome: Secondary | ICD-10-CM | POA: Insufficient documentation

## 2019-08-19 ENCOUNTER — Encounter (HOSPITAL_COMMUNITY): Payer: Self-pay

## 2019-08-19 ENCOUNTER — Emergency Department (HOSPITAL_COMMUNITY)
Admission: EM | Admit: 2019-08-19 | Discharge: 2019-08-19 | Disposition: A | Discharge status: Home / Self Care | Payer: Medicaid Other | Attending: Emergency Medicine | Admitting: Emergency Medicine

## 2019-08-19 ENCOUNTER — Other Ambulatory Visit: Payer: Self-pay

## 2019-08-19 DIAGNOSIS — R102 Pelvic and perineal pain: Secondary | ICD-10-CM | POA: Insufficient documentation

## 2019-08-19 DIAGNOSIS — Z113 Encounter for screening for infections with a predominantly sexual mode of transmission: Secondary | ICD-10-CM | POA: Diagnosis not present

## 2019-08-19 LAB — URINALYSIS, ROUTINE W REFLEX MICROSCOPIC
Bacteria, UA: NONE SEEN
Bilirubin Urine: NEGATIVE
Glucose, UA: NEGATIVE mg/dL
Ketones, ur: NEGATIVE mg/dL
Leukocytes,Ua: NEGATIVE
Nitrite: NEGATIVE
Protein, ur: NEGATIVE mg/dL
Specific Gravity, Urine: 1.004 — ABNORMAL LOW (ref 1.005–1.030)
pH: 7 (ref 5.0–8.0)

## 2019-08-19 LAB — WET PREP, GENITAL
Clue Cells Wet Prep HPF POC: NONE SEEN
Sperm: NONE SEEN
Trich, Wet Prep: NONE SEEN
Yeast Wet Prep HPF POC: NONE SEEN

## 2019-08-19 MED ORDER — ACETAMINOPHEN 500 MG PO TABS
1000.0000 mg | ORAL_TABLET | Freq: Once | ORAL | Status: AC
  Administered 2019-08-19: 14:00:00 1000 mg via ORAL
  Filled 2019-08-19: qty 2

## 2019-08-19 NOTE — ED Triage Notes (Signed)
Pt states that she is having pain in her vaginal area. Pt states that she has pain when she walks and urinates. Pt states pain started last night.

## 2019-08-19 NOTE — ED Provider Notes (Signed)
[facility] Emergency Department Provider Note MRN:  [id number]  Arrival date & time: 08/19/19     Chief Complaint   Vaginal Pain   History of Present Illness   Felicia Collier is a 63 y.o. year-old female with a history of schizophrenia presenting to the ED with chief complaint of vaginal pain.  Several days of vaginal pain and irritation, worse when ambulating, worse when urinating.  Patient denies fever, no chest pain or shortness of breath, no upper abdominal pain.  Pain is mild, constant, no other exacerbating or alleviating factors.  Review of Systems  A complete 10 system review of systems was obtained and all systems are negative except as noted in the HPI and PMH.   Patient's Health History    Past Medical History:  Diagnosis Date  . Abdominal pain   . Chronic back pain   . Chronic knee pain   . Diverticulitis   . Schizophrenia [facility])     Past Surgical History:  Procedure Laterality Date  . ABDOMINAL HYSTERECTOMY    . CHOLECYSTECTOMY    . KNEE SURGERY Left   . WRIST SURGERY Left     Family History  Problem Relation Age of Onset  . Other Mother        no health conditions per patient  . Other Father        no health conditions per patient    Social History   Socioeconomic History  . Marital status: Single    Spouse name: Not on file  . Number of children: Not on file  . Years of education: Not on file  . Highest education level: Not on file  Occupational History  . Not on file  Social Needs  . Financial resource strain: Not on file  . Food insecurity    Worry: Not on file    Inability: Not on file  . Transportation needs    Medical: Not on file    Non-medical: Not on file  Tobacco Use  . Smoking status: Never Smoker  . Smokeless tobacco: Never Used  Substance and Sexual Activity  . Alcohol use: No  . Drug use: No  . Sexual activity: Not Currently  Lifestyle  . Physical activity    Days per week: Not on file   Minutes per session: Not on file  . Stress: Not on file  Relationships  . Social Herbalist on phone: Not on file    Gets together: Not on file    Attends religious service: Not on file    Active member of club or organization: Not on file    Attends meetings of clubs or organizations: Not on file    Relationship status: Not on file  . Intimate partner violence    Fear of current or ex partner: Not on file    Emotionally abused: Not on file    Physically abused: Not on file    Forced sexual activity: Not on file  Other Topics Concern  . Not on file  Social History Narrative  . Not on file     Physical Exam  Vital Signs and Nursing Notes reviewed Vitals:   08/19/19 1226 08/19/19 1421  BP: (!) 144/83 127/81  Pulse: 86 70  Resp: 16 16  Temp: 98.7 F (37.1 C)   SpO2: 97% 100%    CONSTITUTIONAL: Well-appearing, NAD NEURO:  Alert and oriented x 3, no focal deficits EYES:  eyes equal and reactive ENT/NECK:  no LAD, no JVD CARDIO: Regular rate, well-perfused, normal S1 and S2 PULM:  CTAB no wheezing or rhonchi GI/GU:  normal bowel sounds, non-distended, non-tender MSK/SPINE:  No gross deformities, no edema SKIN:  no rash, atraumatic PSYCH: Mildly disorganized speech and behavior  Diagnostic and Interventional Summary    Labs Reviewed  WET PREP, GENITAL - Abnormal; Notable for the following components:      Result Value   WBC, Wet Prep HPF POC MODERATE (*)    All other components within normal limits  URINALYSIS, ROUTINE W REFLEX MICROSCOPIC - Abnormal; Notable for the following components:   Color, Urine COLORLESS (*)    Specific Gravity, Urine 1.004 (*)    Hgb urine dipstick SMALL (*)    All other components within normal limits  GC/CHLAMYDIA PROBE AMP ([place]) NOT AT [facility]    No orders to display    Medications  acetaminophen (TYLENOL) tablet 1,000 mg (1,000 mg Oral Given 08/19/19 1410)     Procedures Critical Care  ED Course and Medical  Decision Making  I have reviewed the triage vital signs and the nursing notes.  Pertinent labs & imaging results that were available during my care of the patient were reviewed by me and considered in my medical decision making (see below for details).  Will perform pelvic exam to exclude cellulitis or PID or other infectious etiology, urinalysis pending.  Patient has a soft and nontender abdomen and normal vital signs, well-appearing, ambulating without issue, appears comfortable.  This seems to be more of a well visit related to her underlying psychiatric illness and poor understanding of her symptoms.  Frequent ED visitor.  Little to no concern for emergent condition, anticipating discharge.  Patient is seemingly at her baseline, no SI, no HI, no AVH.  Exam is reassuring, no evidence of infection, no adnexal masses or tenderness, no cervical motion tenderness, no significant discharge.  Patient is requesting discharge, advised PCP follow-up.  Elmer Sow. Pilar Plate, MD [facility] [email or web address]  Final Clinical Impressions(s) / ED Diagnoses     ICD-10-CM   1. Vaginal pain  R10.2     ED Discharge Orders    None      Discharge Instructions Discussed with and Provided to Patient:   Discharge Instructions     You were evaluated in the Emergency Department and after careful evaluation, we did not find any emergent condition requiring admission or further testing in the hospital.  Please return to the Emergency Department if you experience any worsening of your condition.  We encourage you to follow up with a primary care provider.  Thank you for allowing [place] to be a part of your care.        Sabas Sous, MD 08/19/19 1538

## 2019-08-19 NOTE — Discharge Instructions (Addendum)
You were evaluated in the Emergency Department and after careful evaluation, we did not find any emergent condition requiring admission or further testing in the hospital. ° °Please return to the Emergency Department if you experience any worsening of your condition.  We encourage you to follow up with a primary care provider.  Thank you for allowing us to be a part of your care. °

## 2019-08-19 NOTE — ED Notes (Signed)
ED Provider at bedside. 

## 2019-08-21 ENCOUNTER — Encounter: Payer: Self-pay | Admitting: Family Medicine

## 2019-08-21 ENCOUNTER — Ambulatory Visit (INDEPENDENT_AMBULATORY_CARE_PROVIDER_SITE_OTHER): Payer: Medicaid Other | Admitting: Family Medicine

## 2019-08-21 ENCOUNTER — Other Ambulatory Visit: Payer: Self-pay

## 2019-08-21 VITALS — BP 125/61 | HR 60 | Temp 98.1°F | Ht 66.0 in | Wt 168.0 lb

## 2019-08-21 DIAGNOSIS — G894 Chronic pain syndrome: Secondary | ICD-10-CM

## 2019-08-21 DIAGNOSIS — R102 Pelvic and perineal pain unspecified side: Secondary | ICD-10-CM

## 2019-08-21 DIAGNOSIS — N898 Other specified noninflammatory disorders of vagina: Secondary | ICD-10-CM

## 2019-08-21 DIAGNOSIS — R8290 Unspecified abnormal findings in urine: Secondary | ICD-10-CM

## 2019-08-21 DIAGNOSIS — R319 Hematuria, unspecified: Secondary | ICD-10-CM

## 2019-08-21 DIAGNOSIS — N390 Urinary tract infection, site not specified: Secondary | ICD-10-CM

## 2019-08-21 DIAGNOSIS — Z09 Encounter for follow-up examination after completed treatment for conditions other than malignant neoplasm: Secondary | ICD-10-CM | POA: Diagnosis not present

## 2019-08-21 LAB — GC/CHLAMYDIA PROBE AMP (~~LOC~~) NOT AT ARMC
Chlamydia: NEGATIVE
Neisseria Gonorrhea: NEGATIVE

## 2019-08-21 LAB — POCT URINALYSIS DIPSTICK
Bilirubin, UA: NEGATIVE
Glucose, UA: NEGATIVE
Ketones, UA: NEGATIVE
Nitrite, UA: NEGATIVE
Protein, UA: NEGATIVE
Spec Grav, UA: 1.025 (ref 1.010–1.025)
Urobilinogen, UA: 0.2 E.U./dL
pH, UA: 7 (ref 5.0–8.0)

## 2019-08-21 MED ORDER — SULFAMETHOXAZOLE-TRIMETHOPRIM 800-160 MG PO TABS: 1.0000 | ORAL_TABLET | Freq: Two times a day (BID) | ORAL | 0 refills | Status: DC

## 2019-08-21 MED ORDER — METRONIDAZOLE 500 MG PO TABS: 500.0000 mg | ORAL_TABLET | Freq: Two times a day (BID) | ORAL | 0 refills | Status: AC

## 2019-08-21 NOTE — Progress Notes (Signed)
[facility]   Hospital Follow Up  Subjective:  Patient ID: Felicia Collier, female    DOB: 07-Feb-1956  Age: 63 y.o. MRN: [id number]  CC:  Chief Complaint  Patient presents with  . Follow-up    ER follow abdominal pain    HPI Felicia Collier is a 63 year old female who presents for Hospital Follow Up today.   Past Medical History:  Diagnosis Date  . Abdominal pain   . Chronic back pain   . Chronic knee pain   . Diverticulitis   . Schizophrenia (HCC)    Current Status: Since her last office visit, she had an ED today for Vaginal Itching and Pain, which she was told to follow up with PCP. She is doing well with no complaints. Her anxiety is moderate today. She has c/o pain in her hands, which she is not taking any pain medications for relief. She denies suicidal ideations, homicidal ideations, or auditory hallucinations. She denies fevers, chills, fatigue, recent infections, weight loss, and night sweats. She has not had any headaches, visual changes, dizziness, and falls. No chest pain, heart palpitations, cough and shortness of breath reported. No reports of GI problems such as nausea, vomiting, diarrhea, and constipation. She has no reports of blood in stools, dysuria and hematuria. She has generalized joint pain today.   Past Surgical History:  Procedure Laterality Date  . ABDOMINAL HYSTERECTOMY    . CHOLECYSTECTOMY    . KNEE SURGERY Left   . WRIST SURGERY Left     Family History  Problem Relation Age of Onset  . Other Mother        no health conditions per patient  . Other Father        no health conditions per patient    Social History   Socioeconomic History  . Marital status: Single    Spouse name: Not on file  . Number of children: Not on file  . Years of education: Not on file  . Highest education level: Not on file  Occupational History  . Not on file  Social Needs  . Financial resource strain: Not on file   . Food insecurity    Worry: Not on file    Inability: Not on file  . Transportation needs    Medical: Not on file    Non-medical: Not on file  Tobacco Use  . Smoking status: Never Smoker  . Smokeless tobacco: Never Used  Substance and Sexual Activity  . Alcohol use: No  . Drug use: No  . Sexual activity: Not Currently  Lifestyle  . Physical activity    Days per week: Not on file    Minutes per session: Not on file  . Stress: Not on file  Relationships  . Social Musician on phone: Not on file    Gets together: Not on file    Attends religious service: Not on file    Active member of club or organization: Not on file    Attends meetings of clubs or organizations: Not on file    Relationship status: Not on file  . Intimate partner violence    Fear of current or ex partner: Not on file    Emotionally abused: Not on file    Physically abused: Not on file    Forced sexual activity: Not on file  Other Topics Concern  . Not on file  Social History Narrative  .  Not on file    Outpatient Medications Prior to Visit  Medication Sig Dispense Refill  . acetaminophen (TYLENOL) 325 MG tablet Take 325 mg by mouth every 6 (six) hours as needed for moderate pain.    Marland Kitchen. gabapentin (NEURONTIN) 100 MG capsule Take 1 capsule (100 mg total) by mouth 3 (three) times daily. (Patient not taking: Reported on 03/23/2019) 21 capsule 0  . loratadine (CLARITIN) 10 MG tablet Take 1 tablet (10 mg total) by mouth daily. 30 tablet 0  . meloxicam (MOBIC) 15 MG tablet Take 1 tablet daily as needed for pain. (Patient not taking: Reported on 03/07/2019) 30 tablet 0  . omeprazole (PRILOSEC) 20 MG capsule Take 1 capsule every morning at least 30 minutes before first dose of Carafate. (Patient not taking: Reported on 03/07/2019) 30 capsule 0  . sucralfate (CARAFATE) 1 g tablet Take 1 tablet (1 g total) by mouth 4 (four) times daily. (Patient not taking: Reported on 03/07/2019) 40 tablet 0  . tiZANidine  (ZANAFLEX) 4 MG capsule Take 1 capsule (4 mg total) by mouth 2 (two) times daily as needed for muscle spasms. (Patient not taking: Reported on 03/23/2019) 30 capsule 2  . sulfamethoxazole-trimethoprim (BACTRIM DS) 800-160 MG tablet Take 1 tablet by mouth 2 (two) times daily. 14 tablet 0   No facility-administered medications prior to visit.     Allergies  Allergen Reactions  . Lidocaine     Face swelling     ROS Review of Systems  Constitutional: Negative.   HENT: Negative.   Eyes: Negative.   Respiratory: Negative.   Cardiovascular: Negative.   Gastrointestinal: Positive for abdominal distention.  Endocrine: Negative.   Genitourinary: Negative.   Musculoskeletal: Positive for arthralgias (generalized joint pain).  Skin: Negative.   Allergic/Immunologic: Negative.   Neurological: Positive for dizziness and headaches.  Hematological: Negative.   Psychiatric/Behavioral: Negative.       Objective:    Physical Exam  Constitutional: She is oriented to person, place, and time. She appears well-developed and well-nourished.  HENT:  Head: Normocephalic and atraumatic.  Eyes: Conjunctivae are normal.  Neck: Normal range of motion. Neck supple.  Cardiovascular: Normal rate, regular rhythm, normal heart sounds and intact distal pulses.  Pulmonary/Chest: Effort normal and breath sounds normal.  Abdominal: Soft. Bowel sounds are normal. She exhibits distension.  Musculoskeletal: Normal range of motion.  Neurological: She is alert and oriented to person, place, and time. She has normal reflexes.  Skin: Skin is warm and dry.  Psychiatric: She has a normal mood and affect. Her behavior is normal. Judgment and thought content normal.  Nursing note and vitals reviewed.   BP 125/61 (BP Location: Left Arm, Patient Position: Sitting, Cuff Size: Large)   Pulse 60   Temp 98.1 F (36.7 C) (Oral)   Ht 5\' 6"  (1.676 m)   Wt 168 lb (76.2 kg)   SpO2 97%   BMI 27.12 kg/m  Wt Readings from  Last 3 Encounters:  08/21/19 168 lb (76.2 kg)  08/19/19 169 lb 1.5 oz (76.7 kg)  07/03/19 169 lb (76.7 kg)     Health Maintenance Due  Topic Date Due  . COLONOSCOPY  03/11/2007  . MAMMOGRAM  02/23/2019    There are no preventive care reminders to display for this patient.  Lab Results  Component Value Date   TSH 0.99 11/22/2016   Lab Results  Component Value Date   WBC 9.4 04/20/2019   HGB 12.9 04/20/2019   HCT 43.4 04/20/2019   MCV  85.8 04/20/2019   PLT 315 04/20/2019   Lab Results  Component Value Date   NA 137 04/20/2019   K 3.5 04/20/2019   CO2 23 04/20/2019   GLUCOSE 101 (H) 04/20/2019   BUN 10 04/20/2019   CREATININE 0.64 04/20/2019   BILITOT 0.5 04/20/2019   ALKPHOS 89 04/20/2019   AST 18 04/20/2019   ALT 17 04/20/2019   PROT 7.4 04/20/2019   ALBUMIN 3.9 04/20/2019   CALCIUM 8.8 (L) 04/20/2019   ANIONGAP 7 04/20/2019   Lab Results  Component Value Date   CHOL 200 05/24/2016   Lab Results  Component Value Date   HDL 56 05/24/2016   Lab Results  Component Value Date   LDLCALC 122 05/24/2016   Lab Results  Component Value Date   TRIG 109 05/24/2016   Lab Results  Component Value Date   CHOLHDL 3.6 05/24/2016   Lab Results  Component Value Date   HGBA1C 5.6 06/30/2018      Assessment & Plan:   1. Hospital discharge follow-up  2. Vaginal itching We will initiate Flagyl today.  - metroNIDAZOLE (FLAGYL) 500 MG tablet; Take 1 tablet (500 mg total) by mouth 2 (two) times daily for 7 days.  Dispense: 14 tablet; Refill: 0 - POCT urinalysis dipstick  3. Vaginal pain - POCT urinalysis dipstick  4. Chronic pain syndrome  5. Abnormal urinalysis Results are pending.  - Urine Culture  6. Urinary tract infection with hematuria, site unspecified We will initiate antibiotic today.  - sulfamethoxazole-trimethoprim (BACTRIM DS) 800-160 MG tablet; Take 1 tablet by mouth 2 (two) times daily.  Dispense: 14 tablet; Refill: 0  7. Follow up Keep  follow up appointment.   Meds ordered this encounter  Medications  . metroNIDAZOLE (FLAGYL) 500 MG tablet    Sig: Take 1 tablet (500 mg total) by mouth 2 (two) times daily for 7 days.    Dispense:  14 tablet    Refill:  0  . sulfamethoxazole-trimethoprim (BACTRIM DS) 800-160 MG tablet    Sig: Take 1 tablet by mouth 2 (two) times daily.    Dispense:  14 tablet    Refill:  0    Orders Placed This Encounter  Procedures  . Urine Culture  . POCT urinalysis dipstick    Referral Orders  No referral(s) requested today    Kathe Becton,  MSN, FNP-BC [address], [place] [address] [phone]- fax   Problem List Items Addressed This Visit      Other   Chronic pain syndrome    Other Visit Diagnoses    Hospital discharge follow-up    -  Primary   Vaginal itching       Relevant Medications   metroNIDAZOLE (FLAGYL) 500 MG tablet   Other Relevant Orders   POCT urinalysis dipstick (Completed)   Vaginal pain       Relevant Orders   POCT urinalysis dipstick (Completed)   Abnormal urinalysis       Relevant Orders   Urine Culture   Urinary tract infection with hematuria, site unspecified       Relevant Medications   metroNIDAZOLE (FLAGYL) 500 MG tablet   sulfamethoxazole-trimethoprim (BACTRIM DS) 800-160 MG tablet   Follow up          Meds ordered this encounter  Medications  . metroNIDAZOLE (FLAGYL) 500 MG tablet    Sig: Take 1 tablet (500 mg total) by mouth  2 (two) times daily for 7 days.    Dispense:  14 tablet    Refill:  0  . sulfamethoxazole-trimethoprim (BACTRIM DS) 800-160 MG tablet    Sig: Take 1 tablet by mouth 2 (two) times daily.    Dispense:  14 tablet    Refill:  0    Follow-up: No follow-ups on file.    Kallie Locks, FNP

## 2019-08-21 NOTE — Patient Instructions (Signed)
Metronidazole tablets or capsules What is this medicine? METRONIDAZOLE (me troe NI da zole) is an antiinfective. It is used to treat certain kinds of bacterial and protozoal infections. It will not work for colds, flu, or other viral infections. This medicine may be used for other purposes; ask your health care provider or pharmacist if you have questions. COMMON BRAND NAME(S): Flagyl What should I tell my health care provider before I take this medicine? They need to know if you have any of these conditions:  [place]ne syndrome  history of blood diseases, like sickle cell anemia or leukemia  history of yeast infection  if you often drink alcohol  liver disease  an unusual or allergic reaction to metronidazole, nitroimidazoles, or other medicines, foods, dyes, or preservatives  pregnant or trying to get pregnant  breast-feeding How should I use this medicine? Take this medicine by mouth with a full glass of water. Follow the directions on the prescription label. Take your medicine at regular intervals. Do not take your medicine more often than directed. Take all of your medicine as directed even if you think you are better. Do not skip doses or stop your medicine early. Talk to your pediatrician regarding the use of this medicine in children. Special care may be needed. Overdosage: If you think you have taken too much of this medicine contact a poison control center or emergency room at once. NOTE: This medicine is only for you. Do not share this medicine with others. What if I miss a dose? If you miss a dose, take it as soon as you can. If it is almost time for your next dose, take only that dose. Do not take double or extra doses. What may interact with this medicine? Do not take this medicine with any of the following medications:  alcohol or any product that contains alcohol  cisapride  disulfiram  dronedarone  pimozide  thioridazine This medicine may also interact with  the following medications:  amiodarone  birth control pills  busulfan  carbamazepine  cimetidine  cyclosporine  fluorouracil  lithium  other medicines that prolong the QT interval (cause an abnormal heart rhythm) like dofetilide, ziprasidone  phenobarbital  phenytoin  quinidine  tacrolimus  vecuronium  warfarin This list may not describe all possible interactions. Give your health care provider a list of all the medicines, herbs, non-prescription drugs, or dietary supplements you use. Also tell them if you smoke, drink alcohol, or use illegal drugs. Some items may interact with your medicine. What should I watch for while using this medicine? Tell your doctor or health care professional if your symptoms do not improve or if they get worse. You may get drowsy or dizzy. Do not drive, use machinery, or do anything that needs mental alertness until you know how this medicine affects you. Do not stand or sit up quickly, especially if you are an older patient. This reduces the risk of dizzy or fainting spells. Ask your doctor or health care professional if you should avoid alcohol. Many nonprescription cough and cold products contain alcohol. Metronidazole can cause an unpleasant reaction when taken with alcohol. The reaction includes flushing, headache, nausea, vomiting, sweating, and increased thirst. The reaction can last from 30 minutes to several hours. If you are being treated for a sexually transmitted disease, avoid sexual contact until you have finished your treatment. Your sexual partner may also need treatment. What side effects may I notice from receiving this medicine? Side effects that you should report to   your doctor or health care professional as soon as possible:  allergic reactions like skin rash or hives, swelling of the face, lips, or tongue  confusion  fast, irregular heartbeat  fever, chills, sore throat  fever with rash, swollen lymph nodes, or  swelling of the face  pain, tingling, numbness in the hands or feet  redness, blistering, peeling or loosening of the skin, including inside the mouth  seizures  sign and symptoms of liver injury like dark yellow or brown urine; general ill feeling or flu-like symptoms; light colored stools; loss of appetite; nausea; right upper belly pain; unusually weak or tired; yellowing of the eyes or skin  vaginal discharge, itching, or odor in women Side effects that usually do not require medical attention (report to your doctor or health care professional if they continue or are bothersome):  changes in taste  diarrhea  headache  nausea, vomiting  stomach pain This list may not describe all possible side effects. Call your doctor for medical advice about side effects. You may report side effects to FDA at 1-800-FDA-1088. Where should I keep my medicine? Keep out of the reach of children. Store at room temperature below 25 degrees C (77 degrees F). Protect from light. Keep container tightly closed. Throw away any unused medicine after the expiration date. NOTE: This sheet is a summary. It may not cover all possible information. If you have questions about this medicine, talk to your doctor, pharmacist, or health care provider.  2020 Elsevier/Gold Standard (2018-10-24 06:52:33) Vaginitis  Vaginitis is irritation and swelling (inflammation) of the vagina. It happens when normal bacteria and yeast in the vagina grow too much. There are many types of this condition. Treatment will depend on the type you have. Follow these instructions at home: Lifestyle  Keep your vagina area clean and dry. ? Avoid using soap. ? Rinse the area with water.  Do not do the following until your doctor says it is okay: ? Wash and clean out the vagina (douche). ? Use tampons. ? Have sex.  Wipe from front to back after going to the bathroom.  Let air reach your vagina. ? Wear cotton underwear. ? Do not  wear: ? Underwear while you sleep. ? Tight pants. ? Thong underwear. ? Underwear or nylons without a cotton panel. ? Take off any wet clothing, such as bathing suits, as soon as possible.  Use gentle, non-scented products. Do not use things that can irritate the vagina, such as fabric softeners. Avoid the following products if they are scented: ? Feminine sprays. ? Detergents. ? Tampons. ? Feminine hygiene products. ? Soaps or bubble baths.  Practice safe sex and use condoms. General instructions  Take over-the-counter and prescription medicines only as told by your doctor.  If you were prescribed an antibiotic medicine, take or use it as told by your doctor. Do not stop taking or using the antibiotic even if you start to feel better.  Keep all follow-up visits as told by your doctor. This is important. Contact a doctor if:  You have pain in your belly.  You have a fever.  Your symptoms last for more than 2-3 days. Get help right away if:  You have a fever and your symptoms get worse all of a sudden. Summary  Vaginitis is irritation and swelling of the vagina. It can happen when the normal bacteria and yeast in the vagina grow too much. There are many types.  Treatment will depend on the type you have.    Do not douche, use tampons , or have sex until your health care provider approves. When you can return to sex, practice safe sex and use condoms. This information is not intended to replace advice given to you by your health care provider. Make sure you discuss any questions you have with your health care provider. Document Released: 01/28/2009 Document Revised: 10/14/2017 Document Reviewed: 11/23/2016 Elsevier Patient Education  2020 [employer]  

## 2019-08-23 LAB — URINE CULTURE

## 2019-08-24 ENCOUNTER — Other Ambulatory Visit: Payer: Self-pay

## 2019-08-24 ENCOUNTER — Telehealth: Payer: Self-pay

## 2019-08-24 ENCOUNTER — Ambulatory Visit (INDEPENDENT_AMBULATORY_CARE_PROVIDER_SITE_OTHER): Payer: Medicaid Other | Admitting: Family Medicine

## 2019-08-24 ENCOUNTER — Encounter: Payer: Self-pay | Admitting: Family Medicine

## 2019-08-24 VITALS — BP 133/63 | HR 71 | Temp 98.3°F | Ht 66.0 in | Wt 169.4 lb

## 2019-08-24 DIAGNOSIS — R8290 Unspecified abnormal findings in urine: Secondary | ICD-10-CM

## 2019-08-24 DIAGNOSIS — F3010 Manic episode without psychotic symptoms, unspecified: Secondary | ICD-10-CM

## 2019-08-24 DIAGNOSIS — F203 Undifferentiated schizophrenia: Secondary | ICD-10-CM | POA: Diagnosis not present

## 2019-08-24 DIAGNOSIS — M25511 Pain in right shoulder: Secondary | ICD-10-CM

## 2019-08-24 DIAGNOSIS — F909 Attention-deficit hyperactivity disorder, unspecified type: Secondary | ICD-10-CM

## 2019-08-24 DIAGNOSIS — Z5320 Procedure and treatment not carried out because of patient's decision for unspecified reasons: Secondary | ICD-10-CM

## 2019-08-24 DIAGNOSIS — Z09 Encounter for follow-up examination after completed treatment for conditions other than malignant neoplasm: Secondary | ICD-10-CM

## 2019-08-24 DIAGNOSIS — G8929 Other chronic pain: Secondary | ICD-10-CM | POA: Diagnosis not present

## 2019-08-24 DIAGNOSIS — F419 Anxiety disorder, unspecified: Secondary | ICD-10-CM

## 2019-08-24 LAB — POCT URINALYSIS DIPSTICK
Bilirubin, UA: NEGATIVE
Glucose, UA: NEGATIVE
Ketones, UA: NEGATIVE
Nitrite, UA: NEGATIVE
Protein, UA: NEGATIVE
Spec Grav, UA: 1.025 (ref 1.010–1.025)
Urobilinogen, UA: 0.2 E.U./dL
pH, UA: 6.5 (ref 5.0–8.0)

## 2019-08-24 MED ORDER — KETOROLAC TROMETHAMINE 30 MG/ML IJ SOLN
30.0000 mg | Freq: Once | INTRAMUSCULAR | Status: AC
  Administered 2019-08-24: 30 mg via INTRAMUSCULAR

## 2019-08-24 NOTE — Progress Notes (Signed)
[facility]    Established Patient Office Visit  Subjective:  Patient ID: Felicia Collier, female    DOB: Apr 30, 1956  Age: 63 y.o. MRN: [id number]  CC:  Chief Complaint  Patient presents with  . Follow-up  . Vaginal Pain  . Muscle Pain    Legs & right arm    HPI Felicia Collier is a 63 year old female who presents for Follow Up today.  Past Medical History:  Diagnosis Date  . Abdominal pain   . Chronic back pain   . Chronic knee pain   . Diverticulitis   . Schizophrenia (HCC)    Current Status: Since her last office visit, she is very anxious today. She has c/o right shoulder pain. She states that her and her son are not agreeing right now. She is very agitated today. She has not completed her antibiotic for her previous UTI, a she believes that we are giving her anti-psychotic medication and refuses to take antibiotics or pain medications. Her next appointment with Psychiatry is 08/30/2019, at [facility] with Dr. Merlyn Albert. She denies suicidal ideations, homicidal ideations, or auditory hallucinations.She denies fevers, chills, fatigue, recent infections, weight loss, and night sweats. She has not had any headaches, visual changes, dizziness, and falls. No chest pain, heart palpitations, cough and shortness of breath reported. No reports of GI problems such as nausea, vomiting, diarrhea, and constipation. She has no reports of blood in stools, dysuria and hematuria.  Past Surgical History:  Procedure Laterality Date  . ABDOMINAL HYSTERECTOMY    . CHOLECYSTECTOMY    . KNEE SURGERY Left   . WRIST SURGERY Left     Family History  Problem Relation Age of Onset  . Other Mother        no health conditions per patient  . Other Father        no health conditions per patient    Social History   Socioeconomic History  . Marital status: Single    Spouse name: Not on file  . Number of children: Not on file  . Years of education: Not on  file  . Highest education level: Not on file  Occupational History  . Not on file  Social Needs  . Financial resource strain: Not on file  . Food insecurity    Worry: Not on file    Inability: Not on file  . Transportation needs    Medical: Not on file    Non-medical: Not on file  Tobacco Use  . Smoking status: Never Smoker  . Smokeless tobacco: Never Used  Substance and Sexual Activity  . Alcohol use: No  . Drug use: No  . Sexual activity: Not Currently  Lifestyle  . Physical activity    Days per week: Not on file    Minutes per session: Not on file  . Stress: Not on file  Relationships  . Social Musician on phone: Not on file    Gets together: Not on file    Attends religious service: Not on file    Active member of club or organization: Not on file    Attends meetings of clubs or organizations: Not on file    Relationship status: Not on file  . Intimate partner violence    Fear of current or ex partner: Not on file    Emotionally abused: Not on file    Physically abused: Not on file    Forced  sexual activity: Not on file  Other Topics Concern  . Not on file  Social History Narrative  . Not on file    Outpatient Medications Prior to Visit  Medication Sig Dispense Refill  . acetaminophen (TYLENOL) 325 MG tablet Take 325 mg by mouth every 6 (six) hours as needed for moderate pain.    Marland Kitchen gabapentin (NEURONTIN) 100 MG capsule Take 1 capsule (100 mg total) by mouth 3 (three) times daily. (Patient not taking: Reported on 03/23/2019) 21 capsule 0  . loratadine (CLARITIN) 10 MG tablet Take 1 tablet (10 mg total) by mouth daily. 30 tablet 0  . meloxicam (MOBIC) 15 MG tablet Take 1 tablet daily as needed for pain. (Patient not taking: Reported on 03/07/2019) 30 tablet 0  . metroNIDAZOLE (FLAGYL) 500 MG tablet Take 1 tablet (500 mg total) by mouth 2 (two) times daily for 7 days. 14 tablet 0  . omeprazole (PRILOSEC) 20 MG capsule Take 1 capsule every morning at least  30 minutes before first dose of Carafate. (Patient not taking: Reported on 03/07/2019) 30 capsule 0  . sucralfate (CARAFATE) 1 g tablet Take 1 tablet (1 g total) by mouth 4 (four) times daily. (Patient not taking: Reported on 03/07/2019) 40 tablet 0  . sulfamethoxazole-trimethoprim (BACTRIM DS) 800-160 MG tablet Take 1 tablet by mouth 2 (two) times daily. 14 tablet 0  . tiZANidine (ZANAFLEX) 4 MG capsule Take 1 capsule (4 mg total) by mouth 2 (two) times daily as needed for muscle spasms. (Patient not taking: Reported on 03/23/2019) 30 capsule 2   No facility-administered medications prior to visit.     Allergies  Allergen Reactions  . Lidocaine     Face swelling     ROS Review of Systems  Constitutional: Negative.   HENT: Negative.   Eyes: Negative.   Respiratory: Negative.   Cardiovascular: Negative.   Gastrointestinal: Negative.   Endocrine: Negative.   Genitourinary: Negative.   Musculoskeletal: Positive for arthralgias (generalized chronic pain).       C/o right shoulder pain today.   Skin: Negative.   Allergic/Immunologic: Negative.   Neurological: Negative.   Hematological: Negative.   Psychiatric/Behavioral: Positive for agitation, behavioral problems and confusion. The patient is nervous/anxious and is hyperactive.    Objective:    Physical Exam  Constitutional: She is oriented to person, place, and time. She appears well-developed and well-nourished.  HENT:  Head: Normocephalic and atraumatic.  Eyes: Conjunctivae are normal.  Neck: Normal range of motion. Neck supple.  Cardiovascular: Normal rate, regular rhythm, normal heart sounds and intact distal pulses.  Pulmonary/Chest: Effort normal and breath sounds normal.  Abdominal: Soft. Bowel sounds are normal.  Musculoskeletal: Normal range of motion.  Neurological: She is alert and oriented to person, place, and time. She has normal reflexes.  Skin: Skin is warm and dry.  Psychiatric:  Agitation, anxious, confusion.     Nursing note and vitals reviewed.   BP 133/63 (BP Location: Left Arm, Patient Position: Sitting, Cuff Size: Large)   Pulse 71   Temp 98.3 F (36.8 C) (Oral)   Ht 5\' 6"  (1.676 m)   Wt 169 lb 6.4 oz (76.8 kg)   SpO2 96%   BMI 27.34 kg/m  Wt Readings from Last 3 Encounters:  08/24/19 169 lb 6.4 oz (76.8 kg)  08/21/19 168 lb (76.2 kg)  08/19/19 169 lb 1.5 oz (76.7 kg)     Health Maintenance Due  Topic Date Due  . COLONOSCOPY  03/11/2007  . MAMMOGRAM  02/23/2019    There are no preventive care reminders to display for this patient.  Lab Results  Component Value Date   TSH 0.99 11/22/2016   Lab Results  Component Value Date   WBC 9.4 04/20/2019   HGB 12.9 04/20/2019   HCT 43.4 04/20/2019   MCV 85.8 04/20/2019   PLT 315 04/20/2019   Lab Results  Component Value Date   NA 137 04/20/2019   K 3.5 04/20/2019   CO2 23 04/20/2019   GLUCOSE 101 (H) 04/20/2019   BUN 10 04/20/2019   CREATININE 0.64 04/20/2019   BILITOT 0.5 04/20/2019   ALKPHOS 89 04/20/2019   AST 18 04/20/2019   ALT 17 04/20/2019   PROT 7.4 04/20/2019   ALBUMIN 3.9 04/20/2019   CALCIUM 8.8 (L) 04/20/2019   ANIONGAP 7 04/20/2019   Lab Results  Component Value Date   CHOL 200 05/24/2016   Lab Results  Component Value Date   HDL 56 05/24/2016   Lab Results  Component Value Date   LDLCALC 122 05/24/2016   Lab Results  Component Value Date   TRIG 109 05/24/2016   Lab Results  Component Value Date   CHOLHDL 3.6 05/24/2016   Lab Results  Component Value Date   HGBA1C 5.6 06/30/2018      Assessment & Plan:   1. Chronic right shoulder pain She continues to refuse to take pain medications that I have sent to pharmacy. We convinced her to get Toradol Injection in our office today.  - ketorolac (TORADOL) 30 MG/ML injection 30 mg  2. Schizophrenia Continue to follow up with Psychiatrist as needed.   3. Patient refuses to take medication  4. Manic behavior (HCC)  5. Hyperactive  behavior  6. Anxiousness  7. Abnormal urinalysis - POCT urinalysis dipstick  8. Follow up She will keep follow up appointment as schedule.  She will keep follow up with Psychiatrist.   Meds ordered this encounter  Medications  . ketorolac (TORADOL) 30 MG/ML injection 30 mg    Orders Placed This Encounter  Procedures  . POCT urinalysis dipstick    Referral Orders  No referral(s) requested today    Raliegh IpNatalie Forestine Macho,  MSN, FNP-BC [facility] [address]  [place], [place] [id number] [phone]- fax  Problem List Items Addressed This Visit      Other   Hyperactive behavior   Manic behavior (HCC)   Patient refuses to take medication    Other Visit Diagnoses    Chronic right shoulder pain    -  Primary   Relevant Medications   ketorolac (TORADOL) 30 MG/ML injection 30 mg (Completed)   Anxiousness       Abnormal urinalysis       Relevant Orders   POCT urinalysis dipstick (Completed)   Follow up          Meds ordered this encounter  Medications  . ketorolac (TORADOL) 30 MG/ML injection 30 mg    Follow-up: No follow-ups on file.    Kallie LocksNatalie M Rani Sisney, FNP

## 2019-08-24 NOTE — Telephone Encounter (Signed)
Felicia Collier,  I received a phone call from [name], a Designer, jewellery from [place]. She wanted to let us know that she follows patient and spoke with her about her medications. Patient states she is not going to take the new rx's prescribed 08/21/2019 because they make her feel bad. [name] states she does not like taking medications at all and wanted to make you aware. Thanks!

## 2019-09-07 ENCOUNTER — Emergency Department (HOSPITAL_COMMUNITY)
Admission: EM | Admit: 2019-09-07 | Discharge: 2019-09-07 | Disposition: A | Discharge status: Home / Self Care | Payer: Medicaid Other | Attending: Emergency Medicine | Admitting: Emergency Medicine

## 2019-09-07 ENCOUNTER — Other Ambulatory Visit: Payer: Self-pay

## 2019-09-07 ENCOUNTER — Encounter (HOSPITAL_COMMUNITY): Payer: Self-pay

## 2019-09-07 DIAGNOSIS — Z79899 Other long term (current) drug therapy: Secondary | ICD-10-CM | POA: Diagnosis not present

## 2019-09-07 DIAGNOSIS — M5431 Sciatica, right side: Secondary | ICD-10-CM

## 2019-09-07 DIAGNOSIS — M25551 Pain in right hip: Secondary | ICD-10-CM | POA: Diagnosis present

## 2019-09-07 NOTE — ED Provider Notes (Signed)
[facility]-EMERGENCY DEPT Provider Note   CSN: [id number] Arrival date & time: 09/07/19  1442     History   Chief Complaint Chief Complaint  [id number] presents with  . Leg Pain    HPI Felicia Collier is a 63 y.o. female presented to emergency department with right-sided hip pain.  She reports going on for many days and possibly weeks.  She reports that she has a pain that originates in her right lower back and wraps around to the lateral aspect of her right thigh.  She has pins-and-needles sensations both of her hands and feet.  She denies any recent falls.    Her back pain is worse when she is laying flat in the bed.  Is somewhat improved when she is sitting up or bending forward.  No fever, chills, numbness, or objective weakness on exam. No saddle anesthesia, urinary or fecal incontinence or retention. No reported history of immunosuppression, IV drug use, cancer, recent spinal surgery, recent trauma, or falls.   Tylenol helps somewhat.     HPI  Past Medical History:  Diagnosis Date  . Abdominal pain   . Chronic back pain   . Chronic knee pain   . Diverticulitis   . Schizophrenia [facility](HCC)     Patient Active Problem List   Diagnosis Date Noted  . Chronic pain syndrome 07/07/2019  . Abdominal discomfort, generalized 05/11/2019  . Hyperactive behavior 05/11/2019  . Manic behavior (HCC) 04/02/2019  . Patient refuses to take medication 04/02/2019  . Auditory hallucinations 04/02/2019  . Visual hallucinations 04/02/2019  . Carpal tunnel syndrome of left wrist 01/16/2019  . Low back pain 01/01/2019  . Diarrhea 07/25/2018  . Pain in left wrist 07/20/2017  . Schizophrenia (HCC) 05/01/2017  . Chronic pain of left knee 02/09/2017  . Unilateral primary osteoarthritis, left knee 02/09/2017  . Neuropathy 11/22/2016  . Gastroesophageal reflux disease without esophagitis 11/22/2016  . Weight gain 11/22/2016    Past Surgical History:  Procedure Laterality  Date  . ABDOMINAL HYSTERECTOMY    . CHOLECYSTECTOMY    . KNEE SURGERY Left   . WRIST SURGERY Left      OB History   No obstetric history on file.      Home Medications    Prior to Admission medications   Medication Sig Start Date End Date Taking? Authorizing Provider  acetaminophen (TYLENOL) 325 MG tablet Take 325 mg by mouth every 6 (six) hours as needed for moderate pain.    [provider]  gabapentin (NEURONTIN) 100 MG capsule Take 1 capsule (100 mg total) by mouth 3 (three) times daily. Patient not taking: Reported on 03/23/2019 03/15/19   Raeford RazorKohut, Stephen, MD  loratadine (CLARITIN) 10 MG tablet Take 1 tablet (10 mg total) by mouth daily. 05/20/19   Palumbo, April, MD  meloxicam (MOBIC) 15 MG tablet Take 1 tablet daily as needed for pain. Patient not taking: Reported on 03/07/2019 03/01/19   Molpus, Jonny RuizJohn, MD  omeprazole (PRILOSEC) 20 MG capsule Take 1 capsule every morning at least 30 minutes before first dose of Carafate. Patient not taking: Reported on 03/07/2019 03/01/19   Molpus, John, MD  sucralfate (CARAFATE) 1 g tablet Take 1 tablet (1 g total) by mouth 4 (four) times daily. Patient not taking: Reported on 03/07/2019 03/01/19   Molpus, John, MD  sulfamethoxazole-trimethoprim (BACTRIM DS) 800-160 MG tablet Take 1 tablet by mouth 2 (two) times daily. 08/21/19   Kallie LocksStroud, Natalie M, FNP  tiZANidine (ZANAFLEX) 4 MG capsule  Take 1 capsule (4 mg total) by mouth 2 (two) times daily as needed for muscle spasms. Patient not taking: Reported on 03/23/2019 03/07/19   Azzie Glatter, FNP    Family History Family History  Problem Relation Age of Onset  . Other Mother        no health conditions per patient  . Other Father        no health conditions per patient    Social History Social History   Tobacco Use  . Smoking status: Never Smoker  . Smokeless tobacco: Never Used  Substance Use Topics  . Alcohol use: No  . Drug use: No     Allergies   Lidocaine   Review of  Systems Review of Systems  Constitutional: Negative for chills and fever.  Respiratory: Negative for cough and shortness of breath.   Cardiovascular: Negative for chest pain and palpitations.  Genitourinary: Negative for difficulty urinating and hematuria.  Musculoskeletal: Positive for arthralgias, back pain, gait problem and myalgias. Negative for neck pain and neck stiffness.  Skin: Negative for rash.  Neurological: Negative for syncope and light-headedness.  All other systems reviewed and are negative.    Physical Exam Updated Vital Signs BP (!) 146/98   Pulse 61   Temp 98.1 F (36.7 C) (Oral)   Resp 18   Wt 77 kg   SpO2 98%   BMI 27.40 kg/m   Physical Exam Vitals signs and nursing note reviewed.  Constitutional:      General: She is not in acute distress.    Appearance: She is well-developed.  HENT:     Head: Normocephalic and atraumatic.  Eyes:     Conjunctiva/sclera: Conjunctivae normal.  Neck:     Musculoskeletal: Neck supple.  Cardiovascular:     Rate and Rhythm: Normal rate and regular rhythm.     Pulses: Normal pulses.  Pulmonary:     Effort: Pulmonary effort is normal. No respiratory distress.  Abdominal:     Palpations: Abdomen is soft.     Tenderness: There is no abdominal tenderness.  Skin:    General: Skin is warm and dry.  Neurological:     Mental Status: She is alert.     GCS: GCS eye subscore is 4. GCS verbal subscore is 5. GCS motor subscore is 6.     Sensory: Sensation is intact.     Gait: Gait is intact.     Comments: No saddle anesthesia No spinal midline tenderness +straight leg test on the right +crossed straight leg test (pain on right)      ED Treatments / Results  Labs (all labs ordered are listed, but only abnormal results are displayed) Labs Reviewed - No data to display  EKG None  Radiology No results found.  Procedures Procedures (including critical care time)  Medications Ordered in ED Medications - No data to  display   Initial Impression / Assessment and Plan / ED Course  I have reviewed the triage vital signs and the nursing notes.  Pertinent labs & imaging results that were available during my care of the patient were reviewed by me and considered in my medical decision making (see chart for details).  63 year old female presenting with sciatica ongoing for several days to weeks.  She has no signs or symptoms of cord compression or cauda equina.  She appears to be neurovascularly intact.  She is able to ambulate.  No concern for acute spinal fracture.  No recent falls or trauma.  I  recommended that she use Tylenol at home for pain.  We will refer her to her primary care physician.  This note was dictated using dragon dictation software.  Please be aware that there may be minor translation errors as a result of this oral dictation   Final Clinical Impressions(s) / ED Diagnoses   Final diagnoses:  Sciatica of right side    ED Discharge Orders    None       Eriyah Fernando, Kermit Balo, MD 09/08/19 1141

## 2019-09-07 NOTE — ED Triage Notes (Signed)
Pt states that her right thigh is hurting. Pt states that she followed up with her dr, and was told it could spasm. Pt states that the pain is severe. Pt ambulatory with 1 crutch.

## 2019-10-03 ENCOUNTER — Ambulatory Visit (INDEPENDENT_AMBULATORY_CARE_PROVIDER_SITE_OTHER): Payer: Medicaid Other | Admitting: Family Medicine

## 2019-10-03 ENCOUNTER — Encounter: Payer: Self-pay | Admitting: Family Medicine

## 2019-10-03 ENCOUNTER — Other Ambulatory Visit: Payer: Self-pay

## 2019-10-03 VITALS — BP 131/73 | HR 66 | Temp 97.8°F | Ht 66.0 in | Wt 166.6 lb

## 2019-10-03 DIAGNOSIS — G894 Chronic pain syndrome: Secondary | ICD-10-CM | POA: Diagnosis not present

## 2019-10-03 DIAGNOSIS — G8929 Other chronic pain: Secondary | ICD-10-CM | POA: Insufficient documentation

## 2019-10-03 DIAGNOSIS — Z09 Encounter for follow-up examination after completed treatment for conditions other than malignant neoplasm: Secondary | ICD-10-CM | POA: Diagnosis not present

## 2019-10-03 DIAGNOSIS — Z0000 Encounter for general adult medical examination without abnormal findings: Secondary | ICD-10-CM | POA: Diagnosis not present

## 2019-10-03 DIAGNOSIS — M25511 Pain in right shoulder: Secondary | ICD-10-CM | POA: Diagnosis not present

## 2019-10-03 DIAGNOSIS — F419 Anxiety disorder, unspecified: Secondary | ICD-10-CM | POA: Diagnosis not present

## 2019-10-03 DIAGNOSIS — Z5320 Procedure and treatment not carried out because of patient's decision for unspecified reasons: Secondary | ICD-10-CM

## 2019-10-03 DIAGNOSIS — Z23 Encounter for immunization: Secondary | ICD-10-CM | POA: Diagnosis not present

## 2019-10-03 LAB — POCT URINALYSIS DIPSTICK
Bilirubin, UA: NEGATIVE
Blood, UA: NEGATIVE
Glucose, UA: NEGATIVE
Ketones, UA: NEGATIVE
Leukocytes, UA: NEGATIVE
Nitrite, UA: NEGATIVE
Protein, UA: NEGATIVE
Spec Grav, UA: 1.02 (ref 1.010–1.025)
Urobilinogen, UA: 0.2 E.U./dL
pH, UA: 7 (ref 5.0–8.0)

## 2019-10-03 LAB — POCT GLYCOSYLATED HEMOGLOBIN (HGB A1C): Hemoglobin A1C: 5.6 % (ref 4.0–5.6)

## 2019-10-03 LAB — GLUCOSE, POCT (MANUAL RESULT ENTRY): POC Glucose: 91 mg/dl (ref 70–99)

## 2019-10-03 MED ORDER — KETOROLAC TROMETHAMINE 30 MG/ML IJ SOLN: 30.0000 mg | Freq: Once | INTRAMUSCULAR | Status: DC

## 2019-10-03 MED ORDER — KETOROLAC TROMETHAMINE 60 MG/2ML IM SOLN
60.0000 mg | Freq: Once | INTRAMUSCULAR | Status: AC
  Administered 2019-10-03: 60 mg via INTRAMUSCULAR

## 2019-10-03 NOTE — Progress Notes (Signed)
[facility]ated [facility] Referral Note  Reason for Referral: Felicia Collier is a 63 y.o. female  Pt was referred by NP, Kathe Becton for: housing Pt reports the following concerns: interest in living independently  Assessment: Patient currently lives with her son and son's girlfriend. Patient expressed interest in living independently, though was ambivalent, stating she may just continue to live with son. Patient receives SSD income. Patient is followed by psychiatry at [facility] per PCP.  Plan: 1. Addressed today: Provided housing resources including [employer], advised that patient can call housing coalition for updated lists of affordable housing in the area. Also provided additional community resources.  2. Referral: [employer]  3. Follow up: as needed  Estanislado Emms, [facility] [phone]

## 2019-10-03 NOTE — Progress Notes (Signed)
[facility]  Hospital Follow Up  Subjective:  Patient ID: Felicia Collier, female    DOB: 24-Mar-1956  Age: 63 y.o. MRN: [id number]  CC: No chief complaint on file.   HPI Felicia Collier is a 63 year old female who presents for Hospital Follow Up today.   Past Medical History:  Diagnosis Date  . Abdominal pain   . Chronic back pain   . Chronic knee pain   . Diverticulitis   . Schizophrenia (HCC)    Current Status: Since her last office visit, she has had an ED and she has c/o chronic sciatica pain. She has several Rxs that we have prescribed to aide in pain relief, but she refuses to take pain medications, because she thinks they are anti-psych medications. However, she allowed [place] to give her Toradol injection at her last appointment. She states that her pain has been relieved since then (08/24/2019), but now sciatic has worsened in her leg X about a few weeks now. She recently followed up with Dr. Merlyn Albert at [facility], who suggested that she begin Psych medications. She continues to refuse medications for her diagnosis of Schizophrenia. Her anxiety is moderate today, r/t family issues and wanting to get her own apartment, which she is currently living with her son at this time and has no privacy. She denies suicidal ideations, homicidal ideations, or auditory hallucinations. She denies fevers, chills, fatigue, recent infections, weight loss, and night sweats. She has not had any headaches, visual changes, dizziness, and falls. No chest pain, heart palpitations, cough and shortness of breath reported. No reports of GI problems such as nausea, vomiting, diarrhea, and constipation. She has no reports of blood in stools, dysuria and hematuria.    Past Surgical History:  Procedure Laterality Date  . ABDOMINAL HYSTERECTOMY    . CHOLECYSTECTOMY    . KNEE SURGERY Left   . WRIST SURGERY Left     Family History  Problem Relation Age of Onset  . Other  Mother        no health conditions per patient  . Other Father        no health conditions per patient    Social History   Socioeconomic History  . Marital status: Single    Spouse name: Not on file  . Number of children: Not on file  . Years of education: Not on file  . Highest education level: Not on file  Occupational History  . Not on file  Social Needs  . Financial resource strain: Not on file  . Food insecurity    Worry: Not on file    Inability: Not on file  . Transportation needs    Medical: Not on file    Non-medical: Not on file  Tobacco Use  . Smoking status: Never Smoker  . Smokeless tobacco: Never Used  Substance and Sexual Activity  . Alcohol use: No  . Drug use: No  . Sexual activity: Not Currently  Lifestyle  . Physical activity    Days per week: Not on file    Minutes per session: Not on file  . Stress: Not on file  Relationships  . Social Musician on phone: Not on file    Gets together: Not on file    Attends religious service: Not on file    Active member of club or organization: Not on file    Attends meetings of clubs or organizations: Not  on file    Relationship status: Not on file  . Intimate partner violence    Fear of current or ex partner: Not on file    Emotionally abused: Not on file    Physically abused: Not on file    Forced sexual activity: Not on file  Other Topics Concern  . Not on file  Social History Narrative  . Not on file    Outpatient Medications Prior to Visit  Medication Sig Dispense Refill  . acetaminophen (TYLENOL) 325 MG tablet Take 325 mg by mouth every 6 (six) hours as needed for moderate pain.    Marland Kitchen. gabapentin (NEURONTIN) 100 MG capsule Take 1 capsule (100 mg total) by mouth 3 (three) times daily. (Patient not taking: Reported on 03/23/2019) 21 capsule 0  . loratadine (CLARITIN) 10 MG tablet Take 1 tablet (10 mg total) by mouth daily. 30 tablet 0  . meloxicam (MOBIC) 15 MG tablet Take 1 tablet daily as  needed for pain. (Patient not taking: Reported on 03/07/2019) 30 tablet 0  . omeprazole (PRILOSEC) 20 MG capsule Take 1 capsule every morning at least 30 minutes before first dose of Carafate. (Patient not taking: Reported on 03/07/2019) 30 capsule 0  . sucralfate (CARAFATE) 1 g tablet Take 1 tablet (1 g total) by mouth 4 (four) times daily. (Patient not taking: Reported on 03/07/2019) 40 tablet 0  . sulfamethoxazole-trimethoprim (BACTRIM DS) 800-160 MG tablet Take 1 tablet by mouth 2 (two) times daily. 14 tablet 0  . tiZANidine (ZANAFLEX) 4 MG capsule Take 1 capsule (4 mg total) by mouth 2 (two) times daily as needed for muscle spasms. (Patient not taking: Reported on 03/23/2019) 30 capsule 2   No facility-administered medications prior to visit.     Allergies  Allergen Reactions  . Lidocaine     Face swelling     ROS Review of Systems  Constitutional: Negative.   HENT: Negative.   Eyes: Negative.   Respiratory: Negative.   Cardiovascular: Negative.   Gastrointestinal: Negative.   Endocrine: Negative.   Genitourinary: Negative.   Musculoskeletal: Positive for arthralgias (generalized joint pain).  Skin: Negative.   Neurological: Positive for numbness (pain/numbness/tingling bilaterally lower extremities, r/t chronic sciatica).  Hematological: Negative.   Psychiatric/Behavioral: Positive for agitation. The patient is nervous/anxious.       Objective:    Physical Exam  Constitutional: She is oriented to person, place, and time. She appears well-developed and well-nourished.  HENT:  Head: Normocephalic and atraumatic.  Eyes: Conjunctivae are normal.  Neck: Normal range of motion. Neck supple.  Cardiovascular: Normal rate, regular rhythm, normal heart sounds and intact distal pulses.  Pulmonary/Chest: Effort normal and breath sounds normal.  Abdominal: Soft. Bowel sounds are normal.  Musculoskeletal:     Comments: Chronic back pain, with sciatica Chronic wrist pain, r/t history  of carpal tunnel.   Neurological: She is alert and oriented to person, place, and time. She has normal reflexes.  Skin: Skin is warm and dry.  Psychiatric: She has a normal mood and affect. Her behavior is normal. Judgment and thought content normal.  Vitals reviewed.   BP 131/73   Pulse 66   Temp 97.8 F (36.6 C) (Oral)   Ht 5\' 6"  (1.676 m)   Wt 166 lb 9.6 oz (75.6 kg)   SpO2 96%   BMI 26.89 kg/m  Wt Readings from Last 3 Encounters:  10/03/19 166 lb 9.6 oz (75.6 kg)  09/07/19 169 lb 12.1 oz (77 kg)  08/24/19 169 lb  6.4 oz (76.8 kg)     Health Maintenance Due  Topic Date Due  . COLONOSCOPY  03/11/2007  . MAMMOGRAM  02/23/2019    There are no preventive care reminders to display for this patient.  Lab Results  Component Value Date   TSH 0.99 11/22/2016   Lab Results  Component Value Date   WBC 9.4 04/20/2019   HGB 12.9 04/20/2019   HCT 43.4 04/20/2019   MCV 85.8 04/20/2019   PLT 315 04/20/2019   Lab Results  Component Value Date   NA 137 04/20/2019   K 3.5 04/20/2019   CO2 23 04/20/2019   GLUCOSE 101 (H) 04/20/2019   BUN 10 04/20/2019   CREATININE 0.64 04/20/2019   BILITOT 0.5 04/20/2019   ALKPHOS 89 04/20/2019   AST 18 04/20/2019   ALT 17 04/20/2019   PROT 7.4 04/20/2019   ALBUMIN 3.9 04/20/2019   CALCIUM 8.8 (L) 04/20/2019   ANIONGAP 7 04/20/2019   Lab Results  Component Value Date   CHOL 200 05/24/2016   Lab Results  Component Value Date   HDL 56 05/24/2016   Lab Results  Component Value Date   LDLCALC 122 05/24/2016   Lab Results  Component Value Date   TRIG 109 05/24/2016   Lab Results  Component Value Date   CHOLHDL 3.6 05/24/2016   Lab Results  Component Value Date   HGBA1C 5.6 10/03/2019      Assessment & Plan:   1. Hospital discharge follow-up  2. Chronic right shoulder pain  3. Chronic pain syndrome She received pain injection in office today.  - ketorolac (TORADOL) injection 60 mg  4. Anxiousness Mild today.    5. Patient refuses to take medication She continues to refuse anti-psych medication.   6. Flu vaccine need - Flu Vaccine QUAD 6+ mos PF IM (Fluarix Quad PF)  7. Healthcare maintenance - Glucose (CBG) - Urinalysis Dipstick - POCT HgB A1C  8. Follow up Keep follow up appointment as scheduled.   Meds ordered this encounter  Medications  . DISCONTD: ketorolac (TORADOL) 30 MG/ML injection 30 mg  . ketorolac (TORADOL) injection 60 mg    Orders Placed This Encounter  Procedures  . Flu Vaccine QUAD 6+ mos PF IM (Fluarix Quad PF)  . Glucose (CBG)  . Urinalysis Dipstick  . POCT HgB A1C    Referral Orders  No referral(s) requested today    Raliegh Ip,  MSN, FNP-BC [facility] [address] [place], [place] [address] [phone] fax  Problem List Items Addressed This Visit      Other   Chronic pain syndrome   Patient refuses to take medication    Other Visit Diagnoses    Hospital discharge follow-up    -  Primary   Chronic right shoulder pain       Relevant Medications   ketorolac (TORADOL) injection 60 mg (Completed)   Anxiousness       Flu vaccine need       Relevant Orders   Flu Vaccine QUAD 6+ mos PF IM (Fluarix Quad PF) (Completed)   Healthcare maintenance       Relevant Orders   Glucose (CBG) (Completed)   Urinalysis Dipstick (Completed)   POCT HgB A1C (Completed)   Follow up          Meds ordered this encounter  Medications  . DISCONTD: ketorolac (TORADOL) 30 MG/ML injection 30 mg  . ketorolac (TORADOL)  injection 60 mg    Follow-up: No follow-ups on file.    Azzie Glatter, FNP

## 2019-10-08 ENCOUNTER — Emergency Department (HOSPITAL_COMMUNITY): Payer: Medicaid Other

## 2019-10-08 ENCOUNTER — Encounter (HOSPITAL_COMMUNITY): Payer: Self-pay

## 2019-10-08 ENCOUNTER — Emergency Department (HOSPITAL_COMMUNITY)
Admission: EM | Admit: 2019-10-08 | Discharge: 2019-10-08 | Disposition: A | Discharge status: Home / Self Care | Payer: Medicaid Other | Attending: Emergency Medicine | Admitting: Emergency Medicine

## 2019-10-08 ENCOUNTER — Other Ambulatory Visit: Payer: Self-pay

## 2019-10-08 DIAGNOSIS — G8929 Other chronic pain: Secondary | ICD-10-CM

## 2019-10-08 DIAGNOSIS — M25511 Pain in right shoulder: Secondary | ICD-10-CM | POA: Diagnosis not present

## 2019-10-08 MED ORDER — KETOROLAC TROMETHAMINE 60 MG/2ML IM SOLN: 30.0000 mg | Freq: Once | INTRAMUSCULAR | Status: DC

## 2019-10-08 NOTE — ED Triage Notes (Addendum)
Pt c/o of R arm pain since arm surgery in 2019.

## 2019-10-08 NOTE — ED Provider Notes (Signed)
[facility]-EMERGENCY DEPT Provider Note   CSN: [id number] Arrival date & time: 10/08/19  [id number]     History   Chief Complaint Arm pain  HPI Felicia Collier is a 63 y.o. female past medical history significant for chronic back and knee pain, diverticulitis, schizophrenia presents to emergency room today with chief complaint of right arm pain x1 year.  She denies new injury to her shoulder or recent fall.  She states the pain is located in her shoulder and radiates to her elbow.  She rates the pain 10 of 10 in severity.  She is unable to describe the pain.  She repeatedly says "it hurts".  She states her primary care doctor told her she need to see an orthopedist and would need an x-ray.  She has taken Tylenol for pain with minimal symptom relief.  She has also tried rubbing cocoa butter on her arm without symptom provement.  She is also endorsing neck pain.  She states she has chronic neck pain and it is unchanged today.  She again is unable to further describe the pain.  She denies any fever, chills, numbness, tingling, decrease sensation, injury, wound, rash. Chart review shows patient has a history of refusing to take medications as she is concerned they are antipsychotics.  Chart review shows she saw her PCP on 10/03/2019 and was given IM Toradol for chronic pain syndrome.  Past Medical History:  Diagnosis Date  . Abdominal pain   . Chronic back pain   . Chronic knee pain   . Diverticulitis   . Schizophrenia [facility](HCC)     Patient Active Problem List   Diagnosis Date Noted  . Chronic right shoulder pain 10/03/2019  . Anxiousness 10/03/2019  . Chronic pain syndrome 07/07/2019  . Abdominal discomfort, generalized 05/11/2019  . Hyperactive behavior 05/11/2019  . Manic behavior (HCC) 04/02/2019  . Patient refuses to take medication 04/02/2019  . Auditory hallucinations 04/02/2019  . Visual hallucinations 04/02/2019  . Carpal tunnel syndrome of left wrist 01/16/2019   . Low back pain 01/01/2019  . Diarrhea 07/25/2018  . Pain in left wrist 07/20/2017  . Schizophrenia (HCC) 05/01/2017  . Chronic pain of left knee 02/09/2017  . Unilateral primary osteoarthritis, left knee 02/09/2017  . Neuropathy 11/22/2016  . Gastroesophageal reflux disease without esophagitis 11/22/2016  . Weight gain 11/22/2016    Past Surgical History:  Procedure Laterality Date  . ABDOMINAL HYSTERECTOMY    . CHOLECYSTECTOMY    . KNEE SURGERY Left   . WRIST SURGERY Left      OB History    Gravida  2   Para      Term      Preterm      AB      Living  2     SAB      TAB      Ectopic      Multiple      Live Births               Home Medications    Prior to Admission medications   Medication Sig Start Date End Date Taking? Authorizing Provider  acetaminophen (TYLENOL) 325 MG tablet Take 325 mg by mouth every 6 (six) hours as needed for moderate pain.   Yes [provider]  gabapentin (NEURONTIN) 100 MG capsule Take 1 capsule (100 mg total) by mouth 3 (three) times daily. Patient not taking: Reported on 03/23/2019 03/15/19   Raeford RazorKohut, Stephen, MD  loratadine [facility](CLARITIN)  10 MG tablet Take 1 tablet (10 mg total) by mouth daily. Patient not taking: Reported on 10/08/2019 05/20/19   Palumbo, April, MD  meloxicam (MOBIC) 15 MG tablet Take 1 tablet daily as needed for pain. Patient not taking: Reported on 03/07/2019 03/01/19   Molpus, Jonny Ruiz, MD  omeprazole (PRILOSEC) 20 MG capsule Take 1 capsule every morning at least 30 minutes before first dose of Carafate. Patient not taking: Reported on 03/07/2019 03/01/19   Molpus, John, MD  sucralfate (CARAFATE) 1 g tablet Take 1 tablet (1 g total) by mouth 4 (four) times daily. Patient not taking: Reported on 03/07/2019 03/01/19   Molpus, John, MD  sulfamethoxazole-trimethoprim (BACTRIM DS) 800-160 MG tablet Take 1 tablet by mouth 2 (two) times daily. Patient not taking: Reported on 10/08/2019 08/21/19   Kallie Locks,  FNP  tiZANidine (ZANAFLEX) 4 MG capsule Take 1 capsule (4 mg total) by mouth 2 (two) times daily as needed for muscle spasms. Patient not taking: Reported on 03/23/2019 03/07/19   Kallie Locks, FNP    Family History Family History  Problem Relation Age of Onset  . Other Mother        no health conditions per patient  . Other Father        no health conditions per patient    Social History Social History   Tobacco Use  . Smoking status: Never Smoker  . Smokeless tobacco: Never Used  Substance Use Topics  . Alcohol use: No  . Drug use: No     Allergies   Lidocaine   Review of Systems Review of Systems  Constitutional: Negative for chills and fever.  Musculoskeletal: Positive for arthralgias and neck pain (chronic). Negative for joint swelling, myalgias and neck stiffness.  Skin: Negative for rash and wound.  Neurological: Negative for weakness and numbness.     Physical Exam Updated Vital Signs BP (!) 142/78 (BP Location: Left Arm)   Pulse 61   Temp 98.1 F (36.7 C) (Oral)   Resp 17   Ht 5\' 5"  (1.651 m)   Wt 75.3 kg   SpO2 96%   BMI 27.62 kg/m   Physical Exam Vitals signs and nursing note reviewed.  Constitutional:      Appearance: She is well-developed. She is not ill-appearing or toxic-appearing.  HENT:     Head: Normocephalic and atraumatic.     Nose: Nose normal.  Eyes:     General: No scleral icterus.       Right eye: No discharge.        Left eye: No discharge.     Conjunctiva/sclera: Conjunctivae normal.  Neck:     Musculoskeletal: Normal range of motion.     Vascular: No JVD.  Cardiovascular:     Rate and Rhythm: Normal rate and regular rhythm.     Pulses: Normal pulses.          Radial pulses are 2+ on the right side and 2+ on the left side.     Heart sounds: Normal heart sounds.  Pulmonary:     Effort: Pulmonary effort is normal.     Breath sounds: Normal breath sounds.  Abdominal:     General: There is no distension.   Musculoskeletal:     Right shoulder: She exhibits decreased range of motion (secondary to pain). She exhibits no swelling, no effusion, no crepitus, no deformity, no laceration, no spasm and normal strength.     Right elbow: Normal.    Right wrist: Normal.  Comments: Right upper extremity is neurologically intact. Compartments above and below affected joint are soft. Brisk cap refill. Strong and equal grip strength in bilateral upper extremities.  Skin:    General: Skin is warm and dry.  Neurological:     Mental Status: She is oriented to person, place, and time.     GCS: GCS eye subscore is 4. GCS verbal subscore is 5. GCS motor subscore is 6.     Comments: Fluent speech, no facial droop.  Psychiatric:        Behavior: Behavior normal.       ED Treatments / Results  Labs (all labs ordered are listed, but only abnormal results are displayed) Labs Reviewed - No data to display  EKG None  Radiology Dg Shoulder Right  Result Date: 10/08/2019 CLINICAL DATA:  Chronic right shoulder pain EXAM: RIGHT SHOULDER - 2+ VIEW; RIGHT HUMERUS - 2+ VIEW COMPARISON:  None. FINDINGS: Alignment is anatomic. No acute fracture. Joint spaces are preserved. No aggressive osseous lesion. IMPRESSION: No significant osseous abnormality. Electronically Signed   By: Macy Mis M.D.   On: 10/08/2019 10:39   Dg Humerus Right  Result Date: 10/08/2019 CLINICAL DATA:  Chronic right shoulder pain EXAM: RIGHT SHOULDER - 2+ VIEW; RIGHT HUMERUS - 2+ VIEW COMPARISON:  None. FINDINGS: Alignment is anatomic. No acute fracture. Joint spaces are preserved. No aggressive osseous lesion. IMPRESSION: No significant osseous abnormality. Electronically Signed   By: Macy Mis M.D.   On: 10/08/2019 10:39    Procedures Procedures (including critical care time)  Medications Ordered in ED Medications - No data to display   Initial Impression / Assessment and Plan / ED Course  I have reviewed the triage vital  signs and the nursing notes.  Pertinent labs & imaging results that were available during my care of the patient were reviewed by me and considered in my medical decision making (see chart for details).  Patient presents to the ED with complaints of pain to the right shoulder x1 year without known injury. Exam without obvious deformity or open wounds.  Decreased ROM secondary to pain.  Full passive range of motion.  Shoulder and elbow are nontender to palpation. NVI distally.  Chart review shows patient had no recent imaging of right upper extremity.  Xray of right shoulder and humerus viewed by me are negative for fracture/dislocation.  Patient refuses medication while in the emergency department.  She states multiple times she is only here to get the x-ray.  I discussed results, treatment plan, need for orthopedics follow-up, and return precautions with the patient. Provided opportunity for questions, patient confirmed understanding and are in agreement with plan.  Patient is stable to be discharged home.   Portions of this note were generated with [employer]. Dictation errors may occur despite best attempts at proofreading.  Final Clinical Impressions(s) / ED Diagnoses   Final diagnoses:  Chronic right shoulder pain    ED Discharge Orders    None       Cherre Robins, PA-C 10/08/19 1204    Maudie Flakes, MD 10/10/19 0111

## 2019-10-08 NOTE — Discharge Instructions (Addendum)
You have been seen today for arm pain. Please read and follow all provided instructions. Return to the emergency room for worsening condition or new concerning symptoms.    The x-ray of your shoulder and arm did not show any signs of broken bones.  You can continue to take Tylenol as needed for pain.  You can also try over-the-counter patches for pain as an icy hot or Voltaren gel.  1. Medications:  Continue usual home medications   2. Treatment: rest, drink plenty of fluids  3. Follow Up: These follow-up with orthopedics.  I have given you the information for Dr. Mardelle Matte.  Please call his office to schedule a follow-up appointment.  When calling say  "this is an emergency department follow-up visit."  It is also a possibility that you have an allergic reaction to any of the medicines that you have been prescribed - Everybody reacts differently to medications and while MOST people have no trouble with most medicines, you may have a reaction such as nausea, vomiting, rash, swelling, shortness of breath. If this is the case, please stop taking the medicine immediately and contact your physician.  ?

## 2019-10-19 ENCOUNTER — Telehealth: Payer: Self-pay | Admitting: Clinical

## 2019-10-19 ENCOUNTER — Emergency Department (HOSPITAL_COMMUNITY)
Admission: EM | Admit: 2019-10-19 | Discharge: 2019-10-19 | Discharge status: Against Medical Advice | Payer: Medicaid Other

## 2019-10-19 NOTE — ED Notes (Signed)
Pt brought back to triage 1 to begin triage. Before staff could ask any questions or check vitals, pt began cussing at staff. Pt stated "what is wrong with yall bringing me in here all the time? Yall do babies. Yes the fuck y'all do" Pt began raising her voice even more. Pt then stated "well I am just going to leave" Pt took all belongings and walked out before staff could ask any triage questions.

## 2019-10-19 NOTE — Telephone Encounter (Signed)
[facility] Note  Reason for Referral: Felicia Collier is a 63 y.o. female  Pt was referred by NP for: housing resources at last PCP visit   Pt reports the following concerns: continuing to search for housing  Plan: 1. Addressed today: Patient called CSW and had additional questions about housing resources provided at PCP visit 10/03/19. CSW found it difficult to understand patient's speech and tangentiality at times during phone call and requested permission to talk to her son, Demetris for clarification. Patient consented and put her son on the call. Son explained that patient would like to live independently, though has not been able to afford it due to limited income from social security. Son indicated that patient wanted to move out to [facility] but he would prefer if she didn't, as she does not have any social support in [place].  Spoke to patient again and advised that more affordable housing may be found through [employer] or [employer]. Patient consented to receive list of affordable housing from [employer] in the mail. Mailed resources to patient.  2. Referral: [employer], [employer]  3. Follow up: as needed  Estanislado Emms, [facility] [employer] [phone]

## 2019-10-20 ENCOUNTER — Emergency Department (HOSPITAL_COMMUNITY)
Admission: EM | Admit: 2019-10-20 | Discharge: 2019-10-20 | Disposition: A | Discharge status: Home / Self Care | Payer: Medicaid Other | Attending: Emergency Medicine | Admitting: Emergency Medicine

## 2019-10-20 ENCOUNTER — Other Ambulatory Visit: Payer: Self-pay

## 2019-10-20 ENCOUNTER — Encounter (HOSPITAL_COMMUNITY): Payer: Self-pay | Admitting: Emergency Medicine

## 2019-10-20 DIAGNOSIS — M79601 Pain in right arm: Secondary | ICD-10-CM | POA: Diagnosis present

## 2019-10-20 DIAGNOSIS — G8929 Other chronic pain: Secondary | ICD-10-CM | POA: Insufficient documentation

## 2019-10-20 LAB — CBC WITH DIFFERENTIAL/PLATELET
Abs Immature Granulocytes: 0.02 10*3/uL (ref 0.00–0.07)
Basophils Absolute: 0 10*3/uL (ref 0.0–0.1)
Basophils Relative: 0 %
Eosinophils Absolute: 0.6 10*3/uL — ABNORMAL HIGH (ref 0.0–0.5)
Eosinophils Relative: 6 %
HCT: 42.9 % (ref 36.0–46.0)
Hemoglobin: 13 g/dL (ref 12.0–15.0)
Immature Granulocytes: 0 %
Lymphocytes Relative: 19 %
Lymphs Abs: 1.8 10*3/uL (ref 0.7–4.0)
MCH: 25.4 pg — ABNORMAL LOW (ref 26.0–34.0)
MCHC: 30.3 g/dL (ref 30.0–36.0)
MCV: 83.8 fL (ref 80.0–100.0)
Monocytes Absolute: 0.8 10*3/uL (ref 0.1–1.0)
Monocytes Relative: 8 %
Neutro Abs: 6.2 10*3/uL (ref 1.7–7.7)
Neutrophils Relative %: 67 %
Platelets: 306 10*3/uL (ref 150–400)
RBC: 5.12 MIL/uL — ABNORMAL HIGH (ref 3.87–5.11)
RDW: 14 % (ref 11.5–15.5)
WBC: 9.4 10*3/uL (ref 4.0–10.5)
nRBC: 0 % (ref 0.0–0.2)

## 2019-10-20 LAB — BASIC METABOLIC PANEL
Anion gap: 10 (ref 5–15)
BUN: 5 mg/dL — ABNORMAL LOW (ref 8–23)
CO2: 25 mmol/L (ref 22–32)
Calcium: 8.8 mg/dL — ABNORMAL LOW (ref 8.9–10.3)
Chloride: 105 mmol/L (ref 98–111)
Creatinine, Ser: 0.67 mg/dL (ref 0.44–1.00)
GFR calc Af Amer: >60 mL/min (ref >60)
GFR calc non Af Amer: >60 mL/min (ref >60)
Glucose, Bld: 108 mg/dL — ABNORMAL HIGH (ref 70–99)
Potassium: 3.1 mmol/L — ABNORMAL LOW (ref 3.5–5.1)
Sodium: 140 mmol/L (ref 135–145)

## 2019-10-20 MED ORDER — POTASSIUM CHLORIDE CRYS ER 20 MEQ PO TBCR
40.0000 meq | EXTENDED_RELEASE_TABLET | Freq: Once | ORAL | Status: DC
  Filled 2019-10-20: qty 2

## 2019-10-20 NOTE — ED Provider Notes (Signed)
[facility] EMERGENCY DEPARTMENT Provider Note   CSN: [id number] Arrival date & time: 10/20/19  0501     History   Chief Complaint Chief Complaint  Patient presents with  . Arm Pain    HPI Felicia Collier is a 63 y.o. female      HPI  Patient is a 63 year old female presenting with over 1 year of right arm pain that is unchanged since she was last seen 11/23 when she had humerus and shoulder x-rays with no acute fracture or dislocations.  Patient states that she is planning to follow-up with orthopedist however she could not find the information on her paperwork.  She states that pain is at its baseline currently and is aching, worse with movement, worse with lifting and with palpation.  Patient states she had surgery done on the arm in the past but is unable to tell me what surgery she had.  States pain is more of a tightness than anything else.  Denies any radiation of pain, denies any nausea, vomiting, diaphoresis.  Denies any chest pain or shortness of breath.  Patient does endorse chronic back pain but denies any trauma to the back or neck.  Denies any new numbness or change in sensation in her hands but states that she is burning sensation when she does not wear gloves.  States that she has no new weakness in her hands.  Denies headache, trauma, history of cancer or blood thinner use.  Denies any bowel or bladder incontinence.   Past Medical History:  Diagnosis Date  . Abdominal pain   . Chronic back pain   . Chronic knee pain   . Diverticulitis   . Schizophrenia [facility])     Patient Active Problem List   Diagnosis Date Noted  . Chronic right shoulder pain 10/03/2019  . Anxiousness 10/03/2019  . Chronic pain syndrome 07/07/2019  . Abdominal discomfort, generalized 05/11/2019  . Hyperactive behavior 05/11/2019  . Manic behavior (HCC) 04/02/2019  . Patient refuses to take medication 04/02/2019  . Auditory hallucinations 04/02/2019  . Visual  hallucinations 04/02/2019  . Carpal tunnel syndrome of left wrist 01/16/2019  . Low back pain 01/01/2019  . Diarrhea 07/25/2018  . Pain in left wrist 07/20/2017  . Schizophrenia (HCC) 05/01/2017  . Chronic pain of left knee 02/09/2017  . Unilateral primary osteoarthritis, left knee 02/09/2017  . Neuropathy 11/22/2016  . Gastroesophageal reflux disease without esophagitis 11/22/2016  . Weight gain 11/22/2016    Past Surgical History:  Procedure Laterality Date  . ABDOMINAL HYSTERECTOMY    . CHOLECYSTECTOMY    . KNEE SURGERY Left   . WRIST SURGERY Left      OB History    Gravida  2   Para      Term      Preterm      AB      Living  2     SAB      TAB      Ectopic      Multiple      Live Births               Home Medications    Prior to Admission medications   Medication Sig Start Date End Date Taking? Authorizing Provider  acetaminophen (TYLENOL) 325 MG tablet Take 325 mg by mouth every 6 (six) hours as needed for moderate pain.    [provider]  gabapentin (NEURONTIN) 100 MG capsule Take 1 capsule (100 mg total) by  mouth 3 (three) times daily. Patient not taking: Reported on 03/23/2019 03/15/19   Virgel Manifold, MD  loratadine (CLARITIN) 10 MG tablet Take 1 tablet (10 mg total) by mouth daily. Patient not taking: Reported on 10/08/2019 05/20/19   Palumbo, April, MD  meloxicam (MOBIC) 15 MG tablet Take 1 tablet daily as needed for pain. Patient not taking: Reported on 03/07/2019 03/01/19   Molpus, Jenny Reichmann, MD  omeprazole (PRILOSEC) 20 MG capsule Take 1 capsule every morning at least 30 minutes before first dose of Carafate. Patient not taking: Reported on 03/07/2019 03/01/19   Molpus, John, MD  sucralfate (CARAFATE) 1 g tablet Take 1 tablet (1 g total) by mouth 4 (four) times daily. Patient not taking: Reported on 03/07/2019 03/01/19   Molpus, John, MD  sulfamethoxazole-trimethoprim (BACTRIM DS) 800-160 MG tablet Take 1 tablet by mouth 2 (two) times daily.  Patient not taking: Reported on 10/08/2019 08/21/19   Azzie Glatter, FNP  tiZANidine (ZANAFLEX) 4 MG capsule Take 1 capsule (4 mg total) by mouth 2 (two) times daily as needed for muscle spasms. Patient not taking: Reported on 03/23/2019 03/07/19   Azzie Glatter, FNP    Family History Family History  Problem Relation Age of Onset  . Other Mother        no health conditions per patient  . Other Father        no health conditions per patient    Social History Social History   Tobacco Use  . Smoking status: Never Smoker  . Smokeless tobacco: Never Used  Substance Use Topics  . Alcohol use: No  . Drug use: No     Allergies   Lidocaine   Review of Systems Review of Systems  Constitutional: Negative for chills and fever.  HENT: Negative for congestion.   Eyes: Negative for pain.  Respiratory: Negative for cough and shortness of breath.   Cardiovascular: Negative for chest pain and leg swelling.  Gastrointestinal: Negative for abdominal pain and vomiting.  Genitourinary: Negative for dysuria.  Musculoskeletal:       Chronic back pain Chronic right shoulder and humerus pain  Skin: Negative for rash.  Neurological: Negative for dizziness and headaches.     Physical Exam Updated Vital Signs BP (!) 144/73 (BP Location: Left Arm)   Pulse 73   Temp 98.4 F (36.9 C) (Oral)   Resp 18   Ht 5\' 6"  (1.676 m)   Wt 75.3 kg   SpO2 97%   BMI 26.79 kg/m   Physical Exam Vitals signs and nursing note reviewed.  Constitutional:      General: She is not in acute distress.    Appearance: Normal appearance. She is not ill-appearing.  HENT:     Head: Normocephalic and atraumatic.  Eyes:     General: No scleral icterus.       Right eye: No discharge.        Left eye: No discharge.     Conjunctiva/sclera: Conjunctivae normal.  Cardiovascular:     Pulses: Normal pulses.     Comments: Radial and brachial pulses strong symmetric Pulmonary:     Effort: Pulmonary effort is  normal.     Breath sounds: No stridor.  Musculoskeletal: Normal range of motion.        General: No swelling or deformity.     Right lower leg: No edema.     Left lower leg: No edema.     Comments: Tenderness to palpation of right shoulder at [facility] joint, deltoid  and trapezius.  Tenderness to palpation over medial humerus over bicep and tricep.  No bony tenderness.  Full range of motion actively and passively of shoulder and humerus.  Strength 5/5 in grip and symmetric.  Elbow flexion extension 5/5.  Neurological:     Mental Status: She is alert and oriented to person, place, and time. Mental status is at baseline.     Comments: Sensation intact grossly at fingertips and wrist.       ED Treatments / Results  Labs (all labs ordered are listed, but only abnormal results are displayed) Labs Reviewed  CBC WITH DIFFERENTIAL/PLATELET - Abnormal; Notable for the following components:      Result Value   RBC 5.12 (*)    MCH 25.4 (*)    Eosinophils Absolute 0.6 (*)    All other components within normal limits  BASIC METABOLIC PANEL - Abnormal; Notable for the following components:   Potassium 3.1 (*)    Glucose, Bld 108 (*)    BUN 5 (*)    Calcium 8.8 (*)    All other components within normal limits    EKG None  Radiology No results found.  Procedures Procedures (including critical care time)  Medications Ordered in ED Medications - No data to display   Initial Impression / Assessment and Plan / ED Course  I have reviewed the triage vital signs and the nursing notes.  Pertinent labs & imaging results that were available during my care of the patient were reviewed by me and considered in my medical decision making (see chart for details).        Patient with chronic right shoulder humerus pain with normal x-rays 1 week ago presents with unchanged symptoms requesting orthopedic follow-up information.  Patient is distally neurovascularly intact has good strength sensation  and pulses.  I have no concern for an acute abnormality as x-rays were within normal limits at last visit and patient has unchanged symptoms.  Patient does describe some burning in her hands but states that this is completely improved when wearing gloves.  Doubt this is related to her neuropathic pain although I do suspect that her chronic back pain may be connected and there may be some element of radiculopathy.  Patient has no concerning symptoms of back pain such as saddle anesthesia, cancer history, blood thinner use or bowel or bladder incontinence.  Blood work ordered in triage with incidental finding of hypokalemia.  Will give patient p.o. potassium and recommend follow-up with primary care.   Final Clinical Impressions(s) / ED Diagnoses   Final diagnoses:  Chronic pain of right upper extremity    ED Discharge Orders    None       Gailen ShelterFondaw, Camreigh Michie S, [place] 10/20/19 [id number]    Linwood DibblesKnapp, Jon, MD 10/21/19 [phone]

## 2019-10-20 NOTE — ED Triage Notes (Addendum)
Patient reports chronic right arm pain radiating to right shoulder for several months denies injury "It feels tight". Pain increases with movement .

## 2019-10-20 NOTE — ED Notes (Signed)
Updated on wait for treatment room. 

## 2019-10-20 NOTE — ED Notes (Signed)
Medication returned to [facility]

## 2019-10-20 NOTE — ED Notes (Signed)
Pt. Left without receiving medication. Informed PA

## 2019-10-20 NOTE — Discharge Instructions (Addendum)
Follow-up with orthopedics for evaluation of right arm pain.  Your potassium was found to be slightly low today.  You do not seem to be exhibiting symptoms of low potassium however it is important that you follow-up with your primary care doctor to have this rechecked.  You are given 1 dose of potassium here.

## 2019-10-22 ENCOUNTER — Emergency Department (HOSPITAL_COMMUNITY)
Admission: EM | Admit: 2019-10-22 | Discharge: 2019-10-23 | Disposition: A | Discharge status: Home / Self Care | Payer: Medicaid Other | Attending: Emergency Medicine | Admitting: Emergency Medicine

## 2019-10-22 ENCOUNTER — Encounter (HOSPITAL_COMMUNITY): Payer: Self-pay | Admitting: Emergency Medicine

## 2019-10-22 ENCOUNTER — Other Ambulatory Visit: Payer: Self-pay

## 2019-10-22 DIAGNOSIS — Z5321 Procedure and treatment not carried out due to patient leaving prior to being seen by health care provider: Secondary | ICD-10-CM | POA: Insufficient documentation

## 2019-10-22 DIAGNOSIS — M791 Myalgia, unspecified site: Secondary | ICD-10-CM | POA: Diagnosis present

## 2019-10-22 LAB — CBC WITH DIFFERENTIAL/PLATELET
Abs Immature Granulocytes: 0.03 10*3/uL (ref 0.00–0.07)
Basophils Absolute: 0 10*3/uL (ref 0.0–0.1)
Basophils Relative: 1 %
Eosinophils Absolute: 0 10*3/uL (ref 0.0–0.5)
Eosinophils Relative: 1 %
HCT: 43.7 % (ref 36.0–46.0)
Hemoglobin: 13.2 g/dL (ref 12.0–15.0)
Immature Granulocytes: 1 %
Lymphocytes Relative: 29 %
Lymphs Abs: 1.5 10*3/uL (ref 0.7–4.0)
MCH: 25.2 pg — ABNORMAL LOW (ref 26.0–34.0)
MCHC: 30.2 g/dL (ref 30.0–36.0)
MCV: 83.6 fL (ref 80.0–100.0)
Monocytes Absolute: 0.6 10*3/uL (ref 0.1–1.0)
Monocytes Relative: 11 %
Neutro Abs: 3.1 10*3/uL (ref 1.7–7.7)
Neutrophils Relative %: 57 %
Platelets: 250 10*3/uL (ref 150–400)
RBC: 5.23 MIL/uL — ABNORMAL HIGH (ref 3.87–5.11)
RDW: 14.3 % (ref 11.5–15.5)
WBC: 5.3 10*3/uL (ref 4.0–10.5)
nRBC: 0 % (ref 0.0–0.2)

## 2019-10-22 LAB — BASIC METABOLIC PANEL
Anion gap: 10 (ref 5–15)
BUN: 9 mg/dL (ref 8–23)
CO2: 25 mmol/L (ref 22–32)
Calcium: 8.6 mg/dL — ABNORMAL LOW (ref 8.9–10.3)
Chloride: 99 mmol/L (ref 98–111)
Creatinine, Ser: 0.93 mg/dL (ref 0.44–1.00)
GFR calc Af Amer: >60 mL/min (ref >60)
GFR calc non Af Amer: >60 mL/min (ref >60)
Glucose, Bld: 105 mg/dL — ABNORMAL HIGH (ref 70–99)
Potassium: 3.3 mmol/L — ABNORMAL LOW (ref 3.5–5.1)
Sodium: 134 mmol/L — ABNORMAL LOW (ref 135–145)

## 2019-10-22 NOTE — ED Triage Notes (Signed)
Patient here with chronic right arm pain, states that it has been going on for a while.  She states that she has been having pain all over her body.  She denies any fever or any exposures to covid.  She states that she has a cold.

## 2019-10-23 NOTE — ED Notes (Signed)
Called for pt x3. No answer.  

## 2019-10-29 ENCOUNTER — Emergency Department (HOSPITAL_COMMUNITY)
Admission: EM | Admit: 2019-10-29 | Discharge: 2019-10-29 | Disposition: A | Discharge status: Home / Self Care | Payer: Medicaid Other | Attending: Emergency Medicine | Admitting: Emergency Medicine

## 2019-10-29 ENCOUNTER — Encounter (HOSPITAL_COMMUNITY): Payer: Self-pay | Admitting: Emergency Medicine

## 2019-10-29 ENCOUNTER — Emergency Department (HOSPITAL_COMMUNITY): Payer: Medicaid Other

## 2019-10-29 ENCOUNTER — Other Ambulatory Visit: Payer: Self-pay

## 2019-10-29 DIAGNOSIS — R059 Cough, unspecified: Secondary | ICD-10-CM

## 2019-10-29 DIAGNOSIS — R05 Cough: Secondary | ICD-10-CM

## 2019-10-29 DIAGNOSIS — U071 COVID-19: Secondary | ICD-10-CM | POA: Insufficient documentation

## 2019-10-29 NOTE — ED Provider Notes (Signed)
[facility] DEPT Provider Note   CSN: [id number] Arrival date & time: 10/29/19  1813     History Chief Complaint  Patient presents with  . Cough    Felicia Collier is a 63 y.o. female.  63 year old female presents with several days of URI symptoms consisting of cough and congestion.  Has had some diarrhea but denies any emesis.  No abdominal discomfort.  She also endorses generalized body aches, loss of taste.  Denies any Covid exposures.  No treatment use prior to arrival.        Past Medical History:  Diagnosis Date  . Abdominal pain   . Chronic back pain   . Chronic knee pain   . Diverticulitis   . Schizophrenia [facility])     Patient Active Problem List   Diagnosis Date Noted  . Chronic right shoulder pain 10/03/2019  . Anxiousness 10/03/2019  . Chronic pain syndrome 07/07/2019  . Abdominal discomfort, generalized 05/11/2019  . Hyperactive behavior 05/11/2019  . Manic behavior ([place]) 04/02/2019  . Patient refuses to take medication 04/02/2019  . Auditory hallucinations 04/02/2019  . Visual hallucinations 04/02/2019  . Carpal tunnel syndrome of left wrist 01/16/2019  . Low back pain 01/01/2019  . Diarrhea 07/25/2018  . Pain in left wrist 07/20/2017  . Schizophrenia ([place]) 05/01/2017  . Chronic pain of left knee 02/09/2017  . Unilateral primary osteoarthritis, left knee 02/09/2017  . Neuropathy 11/22/2016  . Gastroesophageal reflux disease without esophagitis 11/22/2016  . Weight gain 11/22/2016    Past Surgical History:  Procedure Laterality Date  . ABDOMINAL HYSTERECTOMY    . CHOLECYSTECTOMY    . KNEE SURGERY Left   . WRIST SURGERY Left      OB History    Gravida  2   Para      Term      Preterm      AB      Living  2     SAB      TAB      Ectopic      Multiple      Live Births              Family History  Problem Relation Age of Onset  . Other Mother        no health conditions per patient  .  Other Father        no health conditions per patient    Social History   Tobacco Use  . Smoking status: Never Smoker  . Smokeless tobacco: Never Used  Substance Use Topics  . Alcohol use: No  . Drug use: No    Home Medications Prior to Admission medications   Medication Sig Start Date End Date Taking? Authorizing Provider  acetaminophen (TYLENOL) 325 MG tablet Take 325 mg by mouth every 6 (six) hours as needed for moderate pain.    [provider]  gabapentin (NEURONTIN) 100 MG capsule Take 1 capsule (100 mg total) by mouth 3 (three) times daily. Patient not taking: Reported on 03/23/2019 03/15/19   Virgel Manifold, MD  loratadine (CLARITIN) 10 MG tablet Take 1 tablet (10 mg total) by mouth daily. Patient not taking: Reported on 10/08/2019 05/20/19   Palumbo, April, MD  meloxicam (MOBIC) 15 MG tablet Take 1 tablet daily as needed for pain. Patient not taking: Reported on 03/07/2019 03/01/19   Molpus, Jenny Reichmann, MD  omeprazole (PRILOSEC) 20 MG capsule Take 1 capsule every morning at least 30 minutes before first dose  of Carafate. Patient not taking: Reported on 03/07/2019 03/01/19   Molpus, John, MD  sucralfate (CARAFATE) 1 g tablet Take 1 tablet (1 g total) by mouth 4 (four) times daily. Patient not taking: Reported on 03/07/2019 03/01/19   Molpus, John, MD  sulfamethoxazole-trimethoprim (BACTRIM DS) 800-160 MG tablet Take 1 tablet by mouth 2 (two) times daily. Patient not taking: Reported on 10/08/2019 08/21/19   Kallie Locks, FNP  tiZANidine (ZANAFLEX) 4 MG capsule Take 1 capsule (4 mg total) by mouth 2 (two) times daily as needed for muscle spasms. Patient not taking: Reported on 03/23/2019 03/07/19   Kallie Locks, FNP    Allergies    Lidocaine  Review of Systems   Review of Systems  All other systems reviewed and are negative.   Physical Exam Updated Vital Signs BP 124/81   Pulse 85   Temp 98.5 F (36.9 C) (Oral)   Resp 18   SpO2 93%   Physical Exam Vitals and  nursing note reviewed.  Constitutional:      General: She is not in acute distress.    Appearance: Normal appearance. She is well-developed. She is not toxic-appearing.  HENT:     Head: Normocephalic and atraumatic.  Eyes:     General: Lids are normal.     Conjunctiva/sclera: Conjunctivae normal.     Pupils: Pupils are equal, round, and reactive to light.  Neck:     Thyroid: No thyroid mass.     Trachea: No tracheal deviation.  Cardiovascular:     Rate and Rhythm: Normal rate and regular rhythm.     Heart sounds: Normal heart sounds. No murmur. No gallop.   Pulmonary:     Effort: Pulmonary effort is normal. No respiratory distress.     Breath sounds: Normal breath sounds. No stridor. No decreased breath sounds, wheezing, rhonchi or rales.  Abdominal:     General: Bowel sounds are normal. There is no distension.     Palpations: Abdomen is soft.     Tenderness: There is no abdominal tenderness. There is no rebound.  Musculoskeletal:        General: No tenderness. Normal range of motion.     Cervical back: Normal range of motion and neck supple.  Skin:    General: Skin is warm and dry.     Findings: No abrasion or rash.  Neurological:     Mental Status: She is alert and oriented to person, place, and time.     GCS: GCS eye subscore is 4. GCS verbal subscore is 5. GCS motor subscore is 6.     Cranial Nerves: No cranial nerve deficit.     Sensory: No sensory deficit.  Psychiatric:        Speech: Speech normal.        Behavior: Behavior normal.     ED Results / Procedures / Treatments   Labs (all labs ordered are listed, but only abnormal results are displayed) Labs Reviewed - No data to display  EKG None  Radiology No results found.  Procedures Procedures (including critical care time)  Medications Ordered in ED Medications - No data to display  ED Course  I have reviewed the triage vital signs and the nursing notes.  Pertinent labs & imaging results that were  available during my care of the patient were reviewed by me and considered in my medical decision making (see chart for details).    MDM Rules/Calculators/A&P  Patient with negative chest x-ray here.  Suspect Covid and Covid test sent will give PUI precautions Final Clinical Impression(s) / ED Diagnoses Final diagnoses:  None    Rx / DC Orders ED Discharge Orders    None       Lorre NickAllen, Mazen Marcin, MD 10/29/19 2001

## 2019-10-29 NOTE — ED Triage Notes (Signed)
Pt complaint of generalized aches, fever/chills, cough, and no taste; onset over the weekend; denies known COVID exposures.

## 2019-10-29 NOTE — Discharge Instructions (Addendum)
Your Covid test has been sent.  Please set up with MyChart to get your results   Person Under Monitoring Name: Felicia Collier  Location: [address] [place] [address]   Infection Prevention Recommendations for Individuals Confirmed to have, or Being Evaluated for, 2019 Novel Coronavirus (COVID-19) Infection Who Receive Care at Home  Individuals who are confirmed to have, or are being evaluated for, COVID-19 should follow the prevention steps below until a healthcare provider or local or state health department says they can return to normal activities.  Stay home except to get medical care You should restrict activities outside your home, except for getting medical care. Do not go to work, school, or public areas, and do not use public transportation or taxis.  Call ahead before visiting your doctor Before your medical appointment, call the healthcare provider and tell them that you have, or are being evaluated for, COVID-19 infection. This will help the healthcare provider's office take steps to keep other people from getting infected. Ask your healthcare provider to call the local or state health department.  Monitor your symptoms Seek prompt medical attention if your illness is worsening (e.g., difficulty breathing). Before going to your medical appointment, call the healthcare provider and tell them that you have, or are being evaluated for, COVID-19 infection. Ask your healthcare provider to call the local or state health department.  Wear a facemask You should wear a facemask that covers your nose and mouth when you are in the same room with other people and when you visit a healthcare provider. People who live with or visit you should also wear a facemask while they are in the same room with you.  Separate yourself from other people in your home As much as possible, you should stay in a different room from other people in your home. Also, you should use a  separate bathroom, if available.  Avoid sharing household items You should not share dishes, drinking glasses, cups, eating utensils, towels, bedding, or other items with other people in your home. After using these items, you should wash them thoroughly with soap and water.  Cover your coughs and sneezes Cover your mouth and nose with a tissue when you cough or sneeze, or you can cough or sneeze into your sleeve. Throw used tissues in a lined trash can, and immediately wash your hands with soap and water for at least 20 seconds or use an alcohol-based hand rub.  Wash your [employer] your hands often and thoroughly with soap and water for at least 20 seconds. You can use an alcohol-based hand sanitizer if soap and water are not available and if your hands are not visibly dirty. Avoid touching your eyes, nose, and mouth with unwashed hands.   Prevention Steps for Caregivers and Household Members of Individuals Confirmed to have, or Being Evaluated for, COVID-19 Infection Being Cared for in the Home  If you live with, or provide care at home for, a person confirmed to have, or being evaluated for, COVID-19 infection please follow these guidelines to prevent infection:  Follow healthcare provider's instructions Make sure that you understand and can help the patient follow any healthcare provider instructions for all care.  Provide for the patient's basic needs You should help the patient with basic needs in the home and provide support for getting groceries, prescriptions, and other personal needs.  Monitor the patient's symptoms If they are getting sicker, call his or her medical provider and tell them that the  patient has, or is being evaluated for, COVID-19 infection. This will help the healthcare provider's office take steps to keep other people from getting infected. Ask the healthcare provider to call the local or state health department.  Limit the number of people who have  contact with the patient If possible, have only one caregiver for the patient. Other household members should stay in another home or place of residence. If this is not possible, they should stay in another room, or be separated from the patient as much as possible. Use a separate bathroom, if available. Restrict visitors who do not have an essential need to be in the home.  Keep older adults, very young children, and other sick people away from the patient Keep older adults, very young children, and those who have compromised immune systems or chronic health conditions away from the patient. This includes people with chronic heart, lung, or kidney conditions, diabetes, and cancer.  Ensure good ventilation Make sure that shared spaces in the home have good air flow, such as from an air conditioner or an opened window, weather permitting.  Wash your hands often Wash your hands often and thoroughly with soap and water for at least 20 seconds. You can use an alcohol based hand sanitizer if soap and water are not available and if your hands are not visibly dirty. Avoid touching your eyes, nose, and mouth with unwashed hands. Use disposable paper towels to dry your hands. If not available, use dedicated cloth towels and replace them when they become wet.  Wear a facemask and gloves Wear a disposable facemask at all times in the room and gloves when you touch or have contact with the patient's blood, body fluids, and/or secretions or excretions, such as sweat, saliva, sputum, nasal mucus, vomit, urine, or feces.  Ensure the mask fits over your nose and mouth tightly, and do not touch it during use. Throw out disposable facemasks and gloves after using them. Do not reuse. Wash your hands immediately after removing your facemask and gloves. If your personal clothing becomes contaminated, carefully remove clothing and launder. Wash your hands after handling contaminated clothing. Place all used  disposable facemasks, gloves, and other waste in a lined container before disposing them with other household waste. Remove gloves and wash your hands immediately after handling these items.  Do not share dishes, glasses, or other household items with the patient Avoid sharing household items. You should not share dishes, drinking glasses, cups, eating utensils, towels, bedding, or other items with a patient who is confirmed to have, or being evaluated for, COVID-19 infection. After the person uses these items, you should wash them thoroughly with soap and water.  Wash laundry thoroughly Immediately remove and wash clothes or bedding that have blood, body fluids, and/or secretions or excretions, such as sweat, saliva, sputum, nasal mucus, vomit, urine, or feces, on them. Wear gloves when handling laundry from the patient. Read and follow directions on labels of laundry or clothing items and detergent. In general, wash and dry with the warmest temperatures recommended on the label.  Clean all areas the individual has used often Clean all touchable surfaces, such as counters, tabletops, doorknobs, bathroom fixtures, toilets, phones, keyboards, tablets, and bedside tables, every day. Also, clean any surfaces that may have blood, body fluids, and/or secretions or excretions on them. Wear gloves when cleaning surfaces the patient has come in contact with. Use a diluted bleach solution (e.g., dilute bleach with 1 part bleach and 10 parts water)  or a household disinfectant with a label that says EPA-registered for coronaviruses. To make a bleach solution at home, add 1 tablespoon of bleach to 1 quart (4 cups) of water. For a larger supply, add  cup of bleach to 1 gallon (16 cups) of water. Read labels of cleaning products and follow recommendations provided on product labels. Labels contain instructions for safe and effective use of the cleaning product including precautions you should take when applying  the product, such as wearing gloves or eye protection and making sure you have good ventilation during use of the product. Remove gloves and wash hands immediately after cleaning.  Monitor yourself for signs and symptoms of illness Caregivers and household members are considered close contacts, should monitor their health, and will be asked to limit movement outside of the home to the extent possible. Follow the monitoring steps for close contacts listed on the symptom monitoring form.   ? If you have additional questions, contact your local health department or call the epidemiologist on call at [phone] (available 24/7). ? This guidance is subject to change. For the most up-to-date guidance from [facility], please refer to their website: TripMetro.huhttps://www.cdc.gov/coronavirus/2019-ncov/hcp/guidance-prevent-spread.html

## 2019-10-30 ENCOUNTER — Ambulatory Visit (INDEPENDENT_AMBULATORY_CARE_PROVIDER_SITE_OTHER): Payer: Medicaid Other | Admitting: Family Medicine

## 2019-10-30 ENCOUNTER — Encounter: Payer: Self-pay | Admitting: Family Medicine

## 2019-10-30 VITALS — BP 139/76 | HR 87 | Temp 98.1°F | Ht 66.0 in | Wt 161.0 lb

## 2019-10-30 DIAGNOSIS — Z09 Encounter for follow-up examination after completed treatment for conditions other than malignant neoplasm: Secondary | ICD-10-CM | POA: Diagnosis not present

## 2019-10-30 DIAGNOSIS — R300 Dysuria: Secondary | ICD-10-CM

## 2019-10-30 DIAGNOSIS — F203 Undifferentiated schizophrenia: Secondary | ICD-10-CM

## 2019-10-30 DIAGNOSIS — R440 Auditory hallucinations: Secondary | ICD-10-CM

## 2019-10-30 DIAGNOSIS — R059 Cough, unspecified: Secondary | ICD-10-CM

## 2019-10-30 DIAGNOSIS — R05 Cough: Secondary | ICD-10-CM

## 2019-10-30 DIAGNOSIS — B342 Coronavirus infection, unspecified: Secondary | ICD-10-CM

## 2019-10-30 DIAGNOSIS — R0602 Shortness of breath: Secondary | ICD-10-CM

## 2019-10-30 LAB — POCT URINALYSIS DIPSTICK
Blood, UA: NEGATIVE
Glucose, UA: NEGATIVE
Ketones, UA: NEGATIVE
Leukocytes, UA: NEGATIVE
Nitrite, UA: NEGATIVE
Protein, UA: POSITIVE — AB
Spec Grav, UA: 1.025 (ref 1.010–1.025)
Urobilinogen, UA: 1 E.U./dL
pH, UA: 5.5 (ref 5.0–8.0)

## 2019-10-30 LAB — SARS CORONAVIRUS 2 (TAT 6-24 HRS): SARS Coronavirus 2: POSITIVE — AB

## 2019-10-30 MED ORDER — AMOXICILLIN-POT CLAVULANATE 875-125 MG PO TABS: 1.0000 | ORAL_TABLET | Freq: Two times a day (BID) | ORAL | 0 refills | Status: AC

## 2019-10-30 MED ORDER — BENZONATATE 100 MG PO CAPS: 100.0000 mg | ORAL_CAPSULE | Freq: Two times a day (BID) | ORAL | 0 refills | Status: DC | PRN

## 2019-10-30 NOTE — Progress Notes (Signed)
Virtual Visit via Telephone Note  I connected with Felicia Collier on 10/31/19 at  3:20 PM EST by telephone and verified that I am speaking with the correct person using two identifiers.   I discussed the limitations, risks, security and privacy concerns of performing an evaluation and management service by telephone and the availability of in person appointments. I also discussed with the patient that there may be a patient responsible charge related to this service. The patient expressed understanding and agreed to proceed.   History of Present Illness: Past Medical History:  Diagnosis Date  . Abdominal pain   . Chronic back pain   . Chronic knee pain   . Diverticulitis   . Schizophrenia ([place])    Family History  Problem Relation Age of Onset  . Other Mother        no health conditions per patient  . Other Father        no health conditions per patient    Social History   Socioeconomic History  . Marital status: Single    Spouse name: Not on file  . Number of children: Not on file  . Years of education: Not on file  . Highest education level: Not on file  Occupational History  . Not on file  Tobacco Use  . Smoking status: Never Smoker  . Smokeless tobacco: Never Used  Substance and Sexual Activity  . Alcohol use: No  . Drug use: No  . Sexual activity: Not Currently  Other Topics Concern  . Not on file  Social History Narrative  . Not on file   Social Determinants of Health   Financial Resource Strain:   . Difficulty of Paying Living Expenses: Not on file  Food Insecurity:   . Worried About Charity fundraiser in the Last Year: Not on file  . Ran Out of Food in the Last Year: Not on file  Transportation Needs:   . Lack of Transportation (Medical): Not on file  . Lack of Transportation (Non-Medical): Not on file  Physical Activity:   . Days of Exercise per Week: Not on file  . Minutes of Exercise per Session: Not on file  Stress:   . Feeling of Stress : Not  on file  Social Connections:   . Frequency of Communication with Friends and Family: Not on file  . Frequency of Social Gatherings with Friends and Family: Not on file  . Attends Religious Services: Not on file  . Active Member of Clubs or Organizations: Not on file  . Attends Archivist Meetings: Not on file  . Marital Status: Not on file  Intimate Partner Violence:   . Fear of Current or Ex-Partner: Not on file  . Emotionally Abused: Not on file  . Physically Abused: Not on file  . Sexually Abused: Not on file    Allergies  Allergen Reactions  . Lidocaine     Face swelling     Current Outpatient Medications on File Prior to Visit  Medication Sig Dispense Refill  . acetaminophen (TYLENOL) 325 MG tablet Take 325 mg by mouth every 6 (six) hours as needed for moderate pain.    Marland Kitchen gabapentin (NEURONTIN) 100 MG capsule Take 1 capsule (100 mg total) by mouth 3 (three) times daily. (Patient not taking: Reported on 03/23/2019) 21 capsule 0  . loratadine (CLARITIN) 10 MG tablet Take 1 tablet (10 mg total) by mouth daily. (Patient not taking: Reported on 10/08/2019) 30 tablet 0  .  meloxicam (MOBIC) 15 MG tablet Take 1 tablet daily as needed for pain. (Patient not taking: Reported on 03/07/2019) 30 tablet 0  . omeprazole (PRILOSEC) 20 MG capsule Take 1 capsule every morning at least 30 minutes before first dose of Carafate. (Patient not taking: Reported on 03/07/2019) 30 capsule 0  . sucralfate (CARAFATE) 1 g tablet Take 1 tablet (1 g total) by mouth 4 (four) times daily. (Patient not taking: Reported on 03/07/2019) 40 tablet 0  . sulfamethoxazole-trimethoprim (BACTRIM DS) 800-160 MG tablet Take 1 tablet by mouth 2 (two) times daily. (Patient not taking: Reported on 10/08/2019) 14 tablet 0  . tiZANidine (ZANAFLEX) 4 MG capsule Take 1 capsule (4 mg total) by mouth 2 (two) times daily as needed for muscle spasms. (Patient not taking: Reported on 03/23/2019) 30 capsule 2   No current  facility-administered medications on file prior to visit.    Current Status: Since her last office visit, patient reported to ED on 10/29/2019 and tested positive for COVID19. She continues to report cough, shortness of breath, and congestion. She denies fevers, chills, fatigue, recent infections, weight loss, and night sweats. She has not had any headaches, visual changes, dizziness, and falls. No chest pain,and heart palpitations reported. No reports of GI problems such as nausea, vomiting, diarrhea, and constipation. She has no reports dysuria, but denies blood in stools and hematuria. Her anxiety is increased today.she refuses medications. She denies suicidal ideations, or homicidal ideations. She denies pain today.     Observations/Objective:  Telephone Virtual Visit  Assessment and Plan:  1. Hospital discharge follow-up  2. Coronavirus infection Positive on 10/29/2019. Antibiotic Rxs for antibiotics and cough suppressant sent to pharmacy today. We will contact her son to pick up prescription from pharmacy. Patient quarantined for 14 days and counseled to remain at home. We will continue to monitor.  3. Schizophrenia History of refusing medications for treatment.   4. Auditory hallucinations  5. Dysuria - Urinalysis Dipstick  6. Cough  7. Shortness of breath   Follow Up Instructions:  She will follow up in 1 week for Telephone Virtual Visit.   I discussed the assessment and treatment plan with the patient. The patient was provided an opportunity to ask questions and all were answered. The patient agreed with the plan and demonstrated an understanding of the instructions.   The patient was advised to call back or seek an in-person evaluation if the symptoms worsen or if the condition fails to improve as anticipated.  I provided 15 minutes of non-face-to-face time during this encounter.   Kallie Locks, FNP

## 2019-10-31 ENCOUNTER — Telehealth: Payer: Self-pay

## 2019-10-31 DIAGNOSIS — R05 Cough: Secondary | ICD-10-CM | POA: Insufficient documentation

## 2019-10-31 DIAGNOSIS — B342 Coronavirus infection, unspecified: Secondary | ICD-10-CM | POA: Insufficient documentation

## 2019-10-31 DIAGNOSIS — R059 Cough, unspecified: Secondary | ICD-10-CM | POA: Insufficient documentation

## 2019-10-31 DIAGNOSIS — R0602 Shortness of breath: Secondary | ICD-10-CM | POA: Insufficient documentation

## 2019-10-31 NOTE — Telephone Encounter (Signed)
Called and spoke with Pharmacists, "Spana" at [facility] to have medications mailed to patient due to covid positive. Spana states there are delays with mailing rx's and patient would not receive due to USPS delays for 5 to 7 days. Spana states patient can come through drive through and to call before she come to [facility] pharmacy.   I have spoke with patient, she states she is unable to drive to pick up her medications. I asked if she could have her son help her in getting the medications. We have tried to reach out to him multiple times and left messages with no response. She states she will call him when he gets off work. I stressed the importance of getting these medications to help her feel better. Thanks!

## 2019-11-05 ENCOUNTER — Other Ambulatory Visit: Payer: Self-pay

## 2019-11-05 ENCOUNTER — Ambulatory Visit (INDEPENDENT_AMBULATORY_CARE_PROVIDER_SITE_OTHER): Payer: Medicaid Other | Admitting: Family Medicine

## 2019-11-05 DIAGNOSIS — R0602 Shortness of breath: Secondary | ICD-10-CM

## 2019-11-05 DIAGNOSIS — Z09 Encounter for follow-up examination after completed treatment for conditions other than malignant neoplasm: Secondary | ICD-10-CM

## 2019-11-05 DIAGNOSIS — B342 Coronavirus infection, unspecified: Secondary | ICD-10-CM | POA: Diagnosis not present

## 2019-11-05 DIAGNOSIS — R059 Cough, unspecified: Secondary | ICD-10-CM

## 2019-11-05 DIAGNOSIS — R05 Cough: Secondary | ICD-10-CM

## 2019-11-05 DIAGNOSIS — Z5320 Procedure and treatment not carried out because of patient's decision for unspecified reasons: Secondary | ICD-10-CM

## 2019-11-05 NOTE — Progress Notes (Signed)
Virtual Visit via Telephone Note  I connected with Felicia Collier on 11/05/19 at 10:00 AM EST by telephone and verified that I am speaking with the correct person using two identifiers.   I discussed the limitations, risks, security and privacy concerns of performing an evaluation and management service by telephone and the availability of in person appointments. I also discussed with the patient that there may be a patient responsible charge related to this service. The patient expressed understanding and agreed to proceed.   History of Present Illness:  Past Medical History:  Diagnosis Date  . Abdominal pain   . Chronic back pain   . Chronic knee pain   . Diverticulitis   . Schizophrenia ([facility])     Family History  Problem Relation Age of Onset  . Other Mother        no health conditions per patient  . Other Father        no health conditions per patient    Social History   Socioeconomic History  . Marital status: Single    Spouse name: Not on file  . Number of children: Not on file  . Years of education: Not on file  . Highest education level: Not on file  Occupational History  . Not on file  Tobacco Use  . Smoking status: Never Smoker  . Smokeless tobacco: Never Used  Substance and Sexual Activity  . Alcohol use: No  . Drug use: No  . Sexual activity: Not Currently  Other Topics Concern  . Not on file  Social History Narrative  . Not on file   Social Determinants of Health   Financial Resource Strain:   . Difficulty of Paying Living Expenses: Not on file  Food Insecurity:   . Worried About Programme researcher, broadcasting/film/video in the Last Year: Not on file  . Ran Out of Food in the Last Year: Not on file  Transportation Needs:   . Lack of Transportation (Medical): Not on file  . Lack of Transportation (Non-Medical): Not on file  Physical Activity:   . Days of Exercise per Week: Not on file  . Minutes of Exercise per Session: Not on file  Stress:   . Feeling of Stress :  Not on file  Social Connections:   . Frequency of Communication with Friends and Family: Not on file  . Frequency of Social Gatherings with Friends and Family: Not on file  . Attends Religious Services: Not on file  . Active Member of Clubs or Organizations: Not on file  . Attends Banker Meetings: Not on file  . Marital Status: Not on file  Intimate Partner Violence:   . Fear of Current or Ex-Partner: Not on file  . Emotionally Abused: Not on file  . Physically Abused: Not on file  . Sexually Abused: Not on file    Allergies  Allergen Reactions  . Lidocaine     Face swelling     Current Outpatient Medications on File Prior to Visit  Medication Sig Dispense Refill  . acetaminophen (TYLENOL) 325 MG tablet Take 325 mg by mouth every 6 (six) hours as needed for moderate pain.    Marland Kitchen amoxicillin-clavulanate (AUGMENTIN) 875-125 MG tablet Take 1 tablet by mouth 2 (two) times daily for 10 days. 20 tablet 0  . benzonatate (TESSALON) 100 MG capsule Take 1 capsule (100 mg total) by mouth 2 (two) times daily as needed for cough. 20 capsule 0  . gabapentin (NEURONTIN) 100 MG  capsule Take 1 capsule (100 mg total) by mouth 3 (three) times daily. (Patient not taking: Reported on 03/23/2019) 21 capsule 0  . loratadine (CLARITIN) 10 MG tablet Take 1 tablet (10 mg total) by mouth daily. (Patient not taking: Reported on 10/08/2019) 30 tablet 0  . meloxicam (MOBIC) 15 MG tablet Take 1 tablet daily as needed for pain. (Patient not taking: Reported on 03/07/2019) 30 tablet 0  . omeprazole (PRILOSEC) 20 MG capsule Take 1 capsule every morning at least 30 minutes before first dose of Carafate. (Patient not taking: Reported on 03/07/2019) 30 capsule 0  . sucralfate (CARAFATE) 1 g tablet Take 1 tablet (1 g total) by mouth 4 (four) times daily. (Patient not taking: Reported on 03/07/2019) 40 tablet 0  . sulfamethoxazole-trimethoprim (BACTRIM DS) 800-160 MG tablet Take 1 tablet by mouth 2 (two) times daily.  (Patient not taking: Reported on 10/08/2019) 14 tablet 0  . tiZANidine (ZANAFLEX) 4 MG capsule Take 1 capsule (4 mg total) by mouth 2 (two) times daily as needed for muscle spasms. (Patient not taking: Reported on 03/23/2019) 30 capsule 2   No current facility-administered medications on file prior to visit.    Current Status: Since her last office visit, she was recently diagnosed with Coronavirus on 10/29/2019. She has not been able to receive antibiotic and cough medication prescribed. She was informed that she could get a family member to pick up the medications, pharmacy could mail meds to her, or she could pick up at drive through. She declines medication at this time.  She is quarantined at home for 10-14 days, and states that she will not leave home until then. She states that she has been using Robitussin for cough. She denies fevers, chills, fatigue, recent infectionsweight loss, and night sweats. She has not had any headaches, visual changes, dizziness, and falls. No chest pain, heart palpitations, and shortness of breath reported. No reports of GI problems such as nausea, vomiting, diarrhea, and constipation. She has no reports of blood in stools, dysuria and hematuria. Her anxiety is mild today, as she states that she is feeling a lot better nd denies suicidal ideations, homicidal ideations, or auditory hallucinations. She denies pain today.   Observations/Objective: Telephone Virtual Visit   Assessment and Plan:  1. Hospital discharge follow-up  2. Coronavirus infection Diagnosed on 10/29/2019. She is currently quarantined for 10-14 days at home.   3. Cough Rxs for antibiotics and cough suppressant sent to pharmacy, but patient declines to take. She has extensive history of refusing medications. She is taking Robitussin for cough.   4. Refusal to take medications  5. Shortness of breath Stable. She does not report any signs or symptoms of respiratory distress today. Advised to  report to contact office or report to ED if symptom do not improve or worsen.   6. Follow up She will follow up in 1 week. She will follow up sooner if symptoms do not improve.    No orders of the defined types were placed in this encounter.   No orders of the defined types were placed in this encounter.   Referral Orders  No referral(s) requested today    Raliegh IpNatalie Eion Timbrook,  MSN, FNP-BC [facility] [address]  [place], [place] [id number] [phone] fax   I discussed the assessment and treatment plan with the patient. The patient was provided an opportunity to ask questions and all were answered. The patient agreed with  the plan and demonstrated an understanding of the instructions.   The patient was advised to call back or seek an in-person evaluation if the symptoms worsen or if the condition fails to improve as anticipated.  I provided 15 minutes of non-face-to-face time during this encounter.   Azzie Glatter, FNP

## 2019-11-07 ENCOUNTER — Telehealth: Payer: Self-pay | Admitting: Family Medicine

## 2019-11-07 NOTE — Telephone Encounter (Signed)
Patient positive for COVID19 on 10/29/2019. She has been quarantined at home since then. Although antibiotic and cough medication called into pharmacy, Ms. Troung has not been able to pick medication up as of yet. Phone assessment today revealed that she continues to have very bad cough. She has taken all Robitussin which she had at home. It has not been effective. We have attempted multiple times daily to contact her son, and left messages, but haven't heard back. Pharmacy states that she can come in through drive-through to pick up her medications, but patient states that she does not have funds for medications. Patient has history of Schizophrenia, and refuses to take her prescribed medications because of unwanted side effects. However, she does continue to follow up regularly with Psychiatrist at [facility]. She states that she will try to make it to pharmacy today. We will continue monitor patient.    Kathe Becton,  MSN, FNP-BC [facility] [address] [place], [place] [address] [phone] fax

## 2019-11-14 ENCOUNTER — Other Ambulatory Visit: Payer: Self-pay

## 2019-11-14 ENCOUNTER — Ambulatory Visit (INDEPENDENT_AMBULATORY_CARE_PROVIDER_SITE_OTHER): Payer: Medicaid Other | Admitting: Family Medicine

## 2019-11-14 DIAGNOSIS — R05 Cough: Secondary | ICD-10-CM | POA: Diagnosis not present

## 2019-11-14 DIAGNOSIS — Z972 Presence of dental prosthetic device (complete) (partial): Secondary | ICD-10-CM

## 2019-11-14 DIAGNOSIS — Z5320 Procedure and treatment not carried out because of patient's decision for unspecified reasons: Secondary | ICD-10-CM

## 2019-11-14 DIAGNOSIS — B342 Coronavirus infection, unspecified: Secondary | ICD-10-CM

## 2019-11-14 DIAGNOSIS — Z09 Encounter for follow-up examination after completed treatment for conditions other than malignant neoplasm: Secondary | ICD-10-CM

## 2019-11-14 DIAGNOSIS — R0989 Other specified symptoms and signs involving the circulatory and respiratory systems: Secondary | ICD-10-CM | POA: Diagnosis not present

## 2019-11-14 DIAGNOSIS — K0889 Other specified disorders of teeth and supporting structures: Secondary | ICD-10-CM | POA: Diagnosis not present

## 2019-11-14 DIAGNOSIS — F3010 Manic episode without psychotic symptoms, unspecified: Secondary | ICD-10-CM

## 2019-11-14 DIAGNOSIS — G894 Chronic pain syndrome: Secondary | ICD-10-CM

## 2019-11-14 DIAGNOSIS — F203 Undifferentiated schizophrenia: Secondary | ICD-10-CM

## 2019-11-14 DIAGNOSIS — R059 Cough, unspecified: Secondary | ICD-10-CM

## 2019-11-14 DIAGNOSIS — K121 Other forms of stomatitis: Secondary | ICD-10-CM

## 2019-11-14 NOTE — Progress Notes (Signed)
Virtual Visit via Telephone Note  I connected with Felicia Collier on 11/14/19 at 10:00 AM EST by telephone and verified that I am speaking with the correct person using two identifiers.   I discussed the limitations, risks, security and privacy concerns of performing an evaluation and management service by telephone and the availability of in person appointments. I also discussed with the patient that there may be a patient responsible charge related to this service. The patient expressed understanding and agreed to proceed.   History of Present Illness:  Past Medical History:  Diagnosis Date  . Abdominal pain   . Chronic back pain   . Chronic knee pain   . Diverticulitis   . Schizophrenia (HCC)     Family History  Problem Relation Age of Onset  . Other Mother        no health conditions per patient  . Other Father        no health conditions per patient     Social History   Tobacco Use  . Smoking status: Never Smoker  . Smokeless tobacco: Never Used  Substance Use Topics  . Alcohol use: No  . Drug use: No   Allergies  Allergen Reactions  . Lidocaine     Face swelling     Current Status: Since her last offie visit, she previously had a telephone virtual visit on 11/05/2019 and on 10/30/2019, because she tested positive for Coronavirus on 10/29/2019.  Today, she states that she is doing well. Patient continues to have cough and congestion. She states that she has almost completed antibiotic and cough medication that was prescribed to her on 10/19/2019 for positive Coronavirsus. She recently picked up medication on 11/07/2019. Her anxiety is increased today. She has c/o of bad dentures and no finances to get repaired. She is experiencing Manic episodes, hallucinations while on Telephone Virtual Visit today. She does regular follow ups with Psychiatrist. She denies suicidal ideations, homicidal ideations, visual or auditory hallucinations.She denies fevers, chills, fatigue,  recent infections, weight loss, and night sweats. She has not had any headaches, visual changes, dizziness, and falls. No chest pain, heart palpitations, shortness of breath reported. No reports of GI problems such as nausea, vomiting, diarrhea, and constipation. She has no reports of blood in stools, dysuria and hematuria. She denies pain today.     Observations/Objective:  Telephone Virtual History   Assessment and Plan:  1. Coronavirus infection Positive on 10/29/2019. She is currently taking antibiotic and benzonatate. For symptoms.   2. Cough Improved.   3. Chest congestion  4. Ill-fitting dentures  5. Denture sore mouth  6. Undifferentiated schizophrenia (HCC) Moderately anxiety today. She continues to have Telephone Visits with Psychiatrist regularly.   7. Chronic pain syndrome  8. Manic behavior (HCC)  9. Patient refuses to take medication Patient states that she is currently taking antibiotic and cough suppressant, but she continues to refuse all other prescribed medications.   10. Follow up We will schedule follow up Telephone Virtual Visit in 1 week.   No orders of the defined types were placed in this encounter.   No orders of the defined types were placed in this encounter.   Referral Orders  No referral(s) requested today    Raliegh Ip,  MSN, FNP-BC [facility] [address] [place], [place] [address] [phone] fax     I discussed the assessment and treatment plan with the patient. The patient  was provided an opportunity to ask questions and all were answered. The patient agreed with the plan and demonstrated an understanding of the instructions.   The patient was advised to call back or seek an in-person evaluation if the symptoms worsen or if the condition fails to improve as anticipated.  I provided 20 minutes of non-face-to-face time during this  encounter.   Azzie Glatter, FNP

## 2019-11-18 DIAGNOSIS — K0889 Other specified disorders of teeth and supporting structures: Secondary | ICD-10-CM | POA: Insufficient documentation

## 2019-11-18 DIAGNOSIS — Z972 Presence of dental prosthetic device (complete) (partial): Secondary | ICD-10-CM | POA: Insufficient documentation

## 2019-11-18 DIAGNOSIS — K121 Other forms of stomatitis: Secondary | ICD-10-CM | POA: Insufficient documentation

## 2019-12-04 ENCOUNTER — Ambulatory Visit: Payer: Self-pay | Admitting: Family Medicine

## 2020-01-02 ENCOUNTER — Ambulatory Visit: Payer: Self-pay | Admitting: Family Medicine

## 2023-05-17 ENCOUNTER — Other Ambulatory Visit: Payer: Self-pay

## 2023-05-17 ENCOUNTER — Emergency Department (HOSPITAL_COMMUNITY)
Admission: EM | Admit: 2023-05-17 | Discharge: 2023-05-17 | Disposition: A | Discharge status: Home / Self Care | Payer: Medicare Other | Attending: Emergency Medicine | Admitting: Emergency Medicine

## 2023-05-17 ENCOUNTER — Encounter (HOSPITAL_COMMUNITY): Payer: Self-pay

## 2023-05-17 DIAGNOSIS — M25562 Pain in left knee: Secondary | ICD-10-CM | POA: Diagnosis present

## 2023-05-17 DIAGNOSIS — G8929 Other chronic pain: Secondary | ICD-10-CM

## 2023-05-17 DIAGNOSIS — R197 Diarrhea, unspecified: Secondary | ICD-10-CM

## 2023-05-17 DIAGNOSIS — R109 Unspecified abdominal pain: Secondary | ICD-10-CM | POA: Insufficient documentation

## 2023-05-17 LAB — URINALYSIS, ROUTINE W REFLEX MICROSCOPIC
Bilirubin Urine: NEGATIVE
Glucose, UA: NEGATIVE mg/dL
Hgb urine dipstick: NEGATIVE
Ketones, ur: 5 mg/dL — AB
Leukocytes,Ua: NEGATIVE
Nitrite: NEGATIVE
Protein, ur: NEGATIVE mg/dL
Specific Gravity, Urine: 1.015 (ref 1.005–1.030)
pH: 5 (ref 5.0–8.0)

## 2023-05-17 LAB — COMPREHENSIVE METABOLIC PANEL WITH GFR
ALT: 14 U/L (ref 0–44)
AST: 21 U/L (ref 15–41)
Albumin: 3.6 g/dL (ref 3.5–5.0)
Alkaline Phosphatase: 89 U/L (ref 38–126)
Anion gap: 10 (ref 5–15)
BUN: 12 mg/dL (ref 8–23)
CO2: 23 mmol/L (ref 22–32)
Calcium: 9.1 mg/dL (ref 8.9–10.3)
Chloride: 103 mmol/L (ref 98–111)
Creatinine, Ser: 0.84 mg/dL (ref 0.44–1.00)
GFR, Estimated: >60 mL/min
Glucose, Bld: 99 mg/dL (ref 70–99)
Potassium: 2.9 mmol/L — ABNORMAL LOW (ref 3.5–5.1)
Sodium: 136 mmol/L (ref 135–145)
Total Bilirubin: 1 mg/dL (ref 0.3–1.2)
Total Protein: 7.1 g/dL (ref 6.5–8.1)

## 2023-05-17 LAB — CBC
HCT: 40.1 % (ref 36.0–46.0)
Hemoglobin: 12.2 g/dL (ref 12.0–15.0)
MCH: 25.1 pg — ABNORMAL LOW (ref 26.0–34.0)
MCHC: 30.4 g/dL (ref 30.0–36.0)
MCV: 82.5 fL (ref 80.0–100.0)
Platelets: 342 10*3/uL (ref 150–400)
RBC: 4.86 MIL/uL (ref 3.87–5.11)
RDW: 13.9 % (ref 11.5–15.5)
WBC: 10.8 10*3/uL — ABNORMAL HIGH (ref 4.0–10.5)
nRBC: 0 % (ref 0.0–0.2)

## 2023-05-17 LAB — LIPASE, BLOOD: Lipase: 31 U/L (ref 11–51)

## 2023-05-17 MED ORDER — DICLOFENAC SODIUM 1 % EX GEL: 4.0000 g | Freq: Four times a day (QID) | CUTANEOUS | 1 refills | Status: DC

## 2023-05-17 MED ORDER — LOPERAMIDE HCL 2 MG PO CAPS
2.0000 mg | ORAL_CAPSULE | Freq: Once | ORAL | Status: AC
  Administered 2023-05-17: 2 mg via ORAL
  Filled 2023-05-17: qty 1

## 2023-05-17 MED ORDER — HYOSCYAMINE SULFATE 0.125 MG PO TABS: 0.1250 mg | ORAL_TABLET | Freq: Once | ORAL | Status: DC

## 2023-05-17 MED ORDER — POTASSIUM CHLORIDE CRYS ER 20 MEQ PO TBCR
60.0000 meq | EXTENDED_RELEASE_TABLET | Freq: Once | ORAL | Status: AC
  Administered 2023-05-17: 60 meq via ORAL
  Filled 2023-05-17: qty 3

## 2023-05-17 MED ORDER — HYOSCYAMINE SULFATE 0.125 MG SL SUBL: 0.1250 mg | SUBLINGUAL_TABLET | Freq: Four times a day (QID) | SUBLINGUAL | 0 refills | Status: DC | PRN

## 2023-05-17 MED ORDER — HYOSCYAMINE SULFATE 0.125 MG SL SUBL
0.1250 mg | SUBLINGUAL_TABLET | Freq: Once | SUBLINGUAL | Status: AC
  Administered 2023-05-17: 0.125 mg via SUBLINGUAL
  Filled 2023-05-17: qty 1

## 2023-05-17 MED ORDER — HYOSCYAMINE SULFATE 0.125 MG SL SUBL: 0.1250 mg | SUBLINGUAL_TABLET | Freq: Four times a day (QID) | SUBLINGUAL | 0 refills | Status: AC | PRN

## 2023-05-17 NOTE — ED Provider Notes (Signed)
[place] EMERGENCY DEPARTMENT AT [facility] Provider Note   CSN: [id number] Arrival date & time: 05/17/23  1048     History  Chief Complaint  Patient presents with   Knee Pain   Abdominal Pain    Felicia Collier is a 67 y.o. female who presents to the emergency department with chief complaint of diarrhea, chronic knee, and chronic abdominal pain.  History is limited by patient's insight as well as mental health.  She has a past medical history of known osteoarthritis, schizophrenia and chronic pain.  Patient reports left-sided knee pain but feels like it is a bit more swollen today.  No known injuries.  She has been ambulatory.  She states she ran out of her topical diclofenac sodium.  Patient does not have a primary care physician. Patient also reports abdominal pain.  She is unable to characterize specify or quantify her pain but states that she has pain every day.  She also notes that she has had several episodes of loose watery stool today.  It is worse after she eats.  She has a history of previous cholecystectomy and hysterectomy.  She has no recent foreign travel, which is food intake or contacts with similar symptoms.  She has not taken anything for her loose stools.   Knee Pain Abdominal Pain      Home Medications Prior to Admission medications   Medication Sig Start Date End Date Taking? Authorizing Provider  acetaminophen (TYLENOL) 325 MG tablet Take 325 mg by mouth every 6 (six) hours as needed for moderate pain.    [provider]  benzonatate (TESSALON) 100 MG capsule Take 1 capsule (100 mg total) by mouth 2 (two) times daily as needed for cough. 10/30/19   Kallie Locks, FNP  gabapentin (NEURONTIN) 100 MG capsule Take 1 capsule (100 mg total) by mouth 3 (three) times daily. Patient not taking: Reported on 03/23/2019 03/15/19   Raeford Razor, MD  loratadine (CLARITIN) 10 MG tablet Take 1 tablet (10 mg total) by mouth daily. Patient not taking:  Reported on 10/08/2019 05/20/19   Palumbo, April, MD  meloxicam (MOBIC) 15 MG tablet Take 1 tablet daily as needed for pain. Patient not taking: Reported on 03/07/2019 03/01/19   Molpus, Jonny Ruiz, MD  omeprazole (PRILOSEC) 20 MG capsule Take 1 capsule every morning at least 30 minutes before first dose of Carafate. Patient not taking: Reported on 03/07/2019 03/01/19   Molpus, John, MD  sucralfate (CARAFATE) 1 g tablet Take 1 tablet (1 g total) by mouth 4 (four) times daily. Patient not taking: Reported on 03/07/2019 03/01/19   Molpus, John, MD  sulfamethoxazole-trimethoprim (BACTRIM DS) 800-160 MG tablet Take 1 tablet by mouth 2 (two) times daily. Patient not taking: Reported on 10/08/2019 08/21/19   Kallie Locks, FNP  tiZANidine (ZANAFLEX) 4 MG capsule Take 1 capsule (4 mg total) by mouth 2 (two) times daily as needed for muscle spasms. Patient not taking: Reported on 03/23/2019 03/07/19   Kallie Locks, FNP      Allergies    Lidocaine    Review of Systems   Review of Systems  Gastrointestinal:  Positive for abdominal pain.    Physical Exam Updated Vital Signs BP (!) 161/74 (BP Location: Right Arm)   Pulse 93   Temp 98.6 F (37 C) (Oral)   Resp 16   Ht 5\' 6"  (1.676 m)   Wt 73 kg   SpO2 100%   BMI 25.98 kg/m  Physical Exam Vitals  and nursing note reviewed.  Constitutional:      General: She is not in acute distress.    Appearance: She is well-developed. She is not diaphoretic.  HENT:     Head: Normocephalic and atraumatic.     Right Ear: External ear normal.     Left Ear: External ear normal.     Nose: Nose normal.     Mouth/Throat:     Mouth: Mucous membranes are moist.  Eyes:     General: No scleral icterus.    Conjunctiva/sclera: Conjunctivae normal.  Cardiovascular:     Rate and Rhythm: Normal rate and regular rhythm.     Heart sounds: Normal heart sounds. No murmur heard.    No friction rub. No gallop.  Pulmonary:     Effort: Pulmonary effort is normal. No  respiratory distress.     Breath sounds: Normal breath sounds.  Abdominal:     General: Bowel sounds are normal. There is no distension.     Palpations: Abdomen is soft. There is no mass.     Tenderness: There is no abdominal tenderness. There is no guarding.  Musculoskeletal:     Cervical back: Normal range of motion.     Comments: Varus deform any of the left knee, mild effusion, full passive and active range of motion.  Ambulates without antalgic gait, no heat or redness  Skin:    General: Skin is warm and dry.  Neurological:     Mental Status: She is alert and oriented to person, place, and time.  Psychiatric:        Behavior: Behavior normal.     Comments: Mildly disorganized thought    ED Results / Procedures / Treatments   Labs (all labs ordered are listed, but only abnormal results are displayed) Labs Reviewed  COMPREHENSIVE METABOLIC PANEL - Abnormal; Notable for the following components:      Result Value   Potassium 2.9 (*)    All other components within normal limits  CBC - Abnormal; Notable for the following components:   WBC 10.8 (*)    MCH 25.1 (*)    All other components within normal limits  URINALYSIS, ROUTINE W REFLEX MICROSCOPIC - Abnormal; Notable for the following components:   Ketones, ur 5 (*)    All other components within normal limits  LIPASE, BLOOD    EKG None  Radiology No results found.  Procedures Procedures    Medications Ordered in ED Medications  potassium chloride SA (KLOR-CON M) CR tablet 60 mEq (has no administration in time range)  hyoscyamine (LEVSIN) tablet 0.125 mg (has no administration in time range)  loperamide (IMODIUM) capsule 2 mg (has no administration in time range)    ED Course/ Medical Decision Making/ A&P Clinical Course as of 05/17/23 1155  Tue May 17, 2023  1151 Potassium(!): 2.9 [AH]    Clinical Course User Index [AH] Arthor Captain, PA-C                             Medical Decision Making Amount  and/or Complexity of Data Reviewed Labs: ordered. Decision-making details documented in ED Course. ECG/medicine tests: ordered.  Risk Prescription drug management.   67 year old female who presents with complaint of chronic pain in the abdomen and knee.  She has no known injuries and therefore does not appear to need any imaging of the left knee which has a well-documented history of previous osteoarthritis.  She does not appear  to have any signs or symptoms of septic joint, trauma. Do not think she needs any evaluation with imaging and will treat with diclofenac. Will give referral to orthopedics.  Patient also here with diarrhea.  Triage labs ordered and reviewed show only 1 significant finding which is hypokalemia this appears to be previous issues she has had in the past.  Patient given oral potassium here in the emergency department will need close follow-up with PCP.  Discussed outpatient follow-up and return precautions.  Appropriate for discharge at this time.         Final Clinical Impression(s) / ED Diagnoses Final diagnoses:  None    Rx / DC Orders ED Discharge Orders     None         Arthor Captain, PA-C 05/17/23 1723    Gerhard Munch, MD 05/24/23 [phone]

## 2023-05-17 NOTE — ED Triage Notes (Signed)
Pt c/o left knee painx2-3 mos. Pt was able to walk to triage. Pt c/o upper back pain and generalized abd painx61mos. Pt c/o diarrhea started yesterday

## 2023-05-17 NOTE — ED Notes (Signed)
Provider at bedside

## 2023-05-17 NOTE — Discharge Instructions (Signed)
You may take over the counter Imodium for your diarrhea as directed on the box.  Follow-up with a primary care physician.  If you do not have a primary care physician you may utilize the 800-number in this discharge paperwork to coordinate placement with a primary care doctor.  You may also follow-up with an orthopedist.  You have a referral to [facility].  Please call them and set up an appointment for chronic knee pain.  You may apply ice over a towel for pain relief 20 minutes at a time.  Get help right away if you have any new or worsening symptoms.

## 2023-05-18 ENCOUNTER — Emergency Department (EMERGENCY_DEPARTMENT_HOSPITAL)
Admission: EM | Admit: 2023-05-18 | Discharge: 2023-05-19 | Disposition: A | Discharge status: Home / Self Care | Payer: Medicare Other | Source: Home / Self Care | Attending: Emergency Medicine | Admitting: Emergency Medicine

## 2023-05-18 ENCOUNTER — Other Ambulatory Visit: Payer: Self-pay

## 2023-05-18 ENCOUNTER — Emergency Department (HOSPITAL_COMMUNITY): Payer: Medicare Other

## 2023-05-18 ENCOUNTER — Encounter (HOSPITAL_COMMUNITY): Payer: Self-pay | Admitting: *Deleted

## 2023-05-18 ENCOUNTER — Emergency Department (HOSPITAL_COMMUNITY)
Admission: EM | Admit: 2023-05-18 | Discharge: 2023-05-18 | Disposition: A | Discharge status: Home / Self Care | Payer: Medicare Other | Attending: Emergency Medicine | Admitting: Emergency Medicine

## 2023-05-18 DIAGNOSIS — M25571 Pain in right ankle and joints of right foot: Secondary | ICD-10-CM | POA: Insufficient documentation

## 2023-05-18 DIAGNOSIS — M25562 Pain in left knee: Secondary | ICD-10-CM | POA: Insufficient documentation

## 2023-05-18 DIAGNOSIS — F29 Unspecified psychosis not due to a substance or known physiological condition: Secondary | ICD-10-CM

## 2023-05-18 DIAGNOSIS — G8929 Other chronic pain: Secondary | ICD-10-CM | POA: Insufficient documentation

## 2023-05-18 DIAGNOSIS — Z5900 Homelessness unspecified: Secondary | ICD-10-CM

## 2023-05-18 DIAGNOSIS — F203 Undifferentiated schizophrenia: Secondary | ICD-10-CM

## 2023-05-18 DIAGNOSIS — F209 Schizophrenia, unspecified: Secondary | ICD-10-CM | POA: Insufficient documentation

## 2023-05-18 DIAGNOSIS — Z5902 Unsheltered homelessness: Secondary | ICD-10-CM | POA: Insufficient documentation

## 2023-05-18 LAB — CBC
HCT: 40.1 % (ref 36.0–46.0)
Hemoglobin: 12.4 g/dL (ref 12.0–15.0)
MCH: 25.8 pg — ABNORMAL LOW (ref 26.0–34.0)
MCHC: 30.9 g/dL (ref 30.0–36.0)
MCV: 83.4 fL (ref 80.0–100.0)
Platelets: 344 10*3/uL (ref 150–400)
RBC: 4.81 MIL/uL (ref 3.87–5.11)
RDW: 14 % (ref 11.5–15.5)
WBC: 10.9 10*3/uL — ABNORMAL HIGH (ref 4.0–10.5)
nRBC: 0 % (ref 0.0–0.2)

## 2023-05-18 LAB — BASIC METABOLIC PANEL
Anion gap: 14 (ref 5–15)
BUN: 13 mg/dL (ref 8–23)
CO2: 23 mmol/L (ref 22–32)
Calcium: 9.3 mg/dL (ref 8.9–10.3)
Chloride: 103 mmol/L (ref 98–111)
Creatinine, Ser: 0.89 mg/dL (ref 0.44–1.00)
GFR, Estimated: >60 mL/min (ref >60)
Glucose, Bld: 97 mg/dL (ref 70–99)
Potassium: 3.4 mmol/L — ABNORMAL LOW (ref 3.5–5.1)
Sodium: 140 mmol/L (ref 135–145)

## 2023-05-18 LAB — SALICYLATE LEVEL: Salicylate Lvl: <7 mg/dL — ABNORMAL LOW (ref 7.0–30.0)

## 2023-05-18 LAB — ETHANOL: Alcohol, Ethyl (B): <10 mg/dL (ref <10)

## 2023-05-18 LAB — ACETAMINOPHEN LEVEL: Acetaminophen (Tylenol), Serum: 10 ug/mL (ref 10–30)

## 2023-05-18 MED ORDER — IBUPROFEN 800 MG PO TABS
800.0000 mg | ORAL_TABLET | Freq: Once | ORAL | Status: AC
  Administered 2023-05-18: 800 mg via ORAL
  Filled 2023-05-18: qty 1

## 2023-05-18 MED ORDER — ACETAMINOPHEN 325 MG PO TABS
650.0000 mg | ORAL_TABLET | Freq: Once | ORAL | Status: AC
  Administered 2023-05-18: 650 mg via ORAL
  Filled 2023-05-18: qty 2

## 2023-05-18 MED ORDER — LORAZEPAM 1 MG PO TABS
2.0000 mg | ORAL_TABLET | Freq: Once | ORAL | Status: AC
  Administered 2023-05-18: 2 mg via ORAL
  Filled 2023-05-18: qty 2

## 2023-05-18 NOTE — ED Provider Notes (Signed)
[place] EMERGENCY DEPARTMENT AT [facility] Provider Note   CSN: [id number] Arrival date & time: 05/18/23  2016     History  Chief Complaint  Patient presents with   Homeless   Psychiatric Evaluation    Felicia Collier is a 67 y.o. female with past medical history significant for bipolar, schizophrenia, chronic knee and ankle pain who presents with concern for need for psychiatric evaluation.  Patient was just seen earlier today by myself, I spoke with son and daughter at attempted discharge for overall unremarkable arthritis workup, and was informed that patient has been noncompliant on her injection aripiprazole medication for several months, has been having increasingly erratic behavior, and concerned that she is becoming a danger to herself due to poor decision-making and self-induced homelessness.  She arrives to the ED with many of the belongings from her apartment in tow, and she has poor insight into her condition.  HPI     Home Medications Prior to Admission medications   Medication Sig Start Date End Date Taking? Authorizing Provider  acetaminophen (TYLENOL) 325 MG tablet Take 325 mg by mouth every 6 (six) hours as needed for moderate pain.    [provider]  benzonatate (TESSALON) 100 MG capsule Take 1 capsule (100 mg total) by mouth 2 (two) times daily as needed for cough. 10/30/19   Kallie Locks, FNP  diclofenac Sodium (VOLTAREN) 1 % GEL Apply 4 g topically 4 (four) times daily. 05/17/23   Arthor Captain, PA-C  gabapentin (NEURONTIN) 100 MG capsule Take 1 capsule (100 mg total) by mouth 3 (three) times daily. Patient not taking: Reported on 03/23/2019 03/15/19   Raeford Razor, MD  hyoscyamine (LEVSIN/SL) 0.125 MG SL tablet Place 1 tablet (0.125 mg total) under the tongue every 6 (six) hours as needed for cramping (abdominal pain). Up to 1.25 mg daily 05/17/23   Arthor Captain, PA-C  loratadine (CLARITIN) 10 MG tablet Take 1 tablet (10 mg total) by  mouth daily. Patient not taking: Reported on 10/08/2019 05/20/19   Palumbo, April, MD  meloxicam (MOBIC) 15 MG tablet Take 1 tablet daily as needed for pain. Patient not taking: Reported on 03/07/2019 03/01/19   Molpus, Jonny Ruiz, MD  omeprazole (PRILOSEC) 20 MG capsule Take 1 capsule every morning at least 30 minutes before first dose of Carafate. Patient not taking: Reported on 03/07/2019 03/01/19   Molpus, John, MD  sucralfate (CARAFATE) 1 g tablet Take 1 tablet (1 g total) by mouth 4 (four) times daily. Patient not taking: Reported on 03/07/2019 03/01/19   Molpus, John, MD  sulfamethoxazole-trimethoprim (BACTRIM DS) 800-160 MG tablet Take 1 tablet by mouth 2 (two) times daily. Patient not taking: Reported on 10/08/2019 08/21/19   Kallie Locks, FNP  tiZANidine (ZANAFLEX) 4 MG capsule Take 1 capsule (4 mg total) by mouth 2 (two) times daily as needed for muscle spasms. Patient not taking: Reported on 03/23/2019 03/07/19   Kallie Locks, FNP      Allergies    Lidocaine    Review of Systems   Review of Systems  All other systems reviewed and are negative.   Physical Exam Updated Vital Signs BP (!) 131/110 (BP Location: Right Arm)   Pulse 79   Temp 98 F (36.7 C) (Oral)   Resp 18   SpO2 99%  Physical Exam Vitals and nursing note reviewed.  Constitutional:      General: She is not in acute distress.    Appearance: Normal appearance.  HENT:  Head: Normocephalic and atraumatic.  Eyes:     General:        Right eye: No discharge.        Left eye: No discharge.  Cardiovascular:     Rate and Rhythm: Normal rate and regular rhythm.  Pulmonary:     Effort: Pulmonary effort is normal. No respiratory distress.  Musculoskeletal:        General: No deformity.  Skin:    General: Skin is warm and dry.  Neurological:     Mental Status: She is alert.     Comments: Patient overall with poor insight, is alert and oriented to herself but seems confused on time, place, situation.  She is  unable to accurately describe her living situation, I had corroborated reports from son and daughter that she lives in [facility], daughter is currently her legal guardian, patient cannot tell me that she is supposed to be taking any medication, she denies any active SI, HI, AVH at this time but does seem to be responding to internal stimuli, signs of decompensated bipolar, schizophrenia  Psychiatric:        Mood and Affect: Mood normal.        Behavior: Behavior normal.     ED Results / Procedures / Treatments   Labs (all labs ordered are listed, but only abnormal results are displayed) Labs Reviewed  BASIC METABOLIC PANEL  CBC  ACETAMINOPHEN LEVEL  ETHANOL  SALICYLATE LEVEL    EKG None  Radiology DG Knee Complete 4 Views Left  Result Date: 05/18/2023 CLINICAL DATA:  Several month history of ongoing knee pain EXAM: LEFT KNEE - COMPLETE 4 VIEW COMPARISON:  Left knee radiograph dated 02/14/2019 FINDINGS: There are no findings of fracture or dislocation. No joint effusion. Severe tricompartmental degenerative changes of the knee, most pronounced in the medial compartment. Well corticated ossific densities in the popliteal fossa may represent loose bodies. Soft tissues are unremarkable. IMPRESSION: Severe tricompartmental degenerative changes of the knee, most pronounced in the medial compartment. Electronically Signed   By: Agustin Cree M.D.   On: 05/18/2023 19:04   DG Ankle Complete Right  Result Date: 05/18/2023 CLINICAL DATA:  Ankle pain common no known injury, initial encounter EXAM: RIGHT ANKLE - COMPLETE 3+ VIEW COMPARISON:  None Available. FINDINGS: Mild soft tissue swelling is noted. Mild degenerative changes at the tibiotalar joint are seen. Mild calcaneal spurring is noted. No fracture is noted. IMPRESSION: Degenerative changes with soft tissue swelling. No acute bony abnormality noted. Electronically Signed   By: Alcide Clever M.D.   On: 05/18/2023 19:03     Procedures Procedures    Medications Ordered in ED Medications  LORazepam (ATIVAN) tablet 2 mg (has no administration in time range)    ED Course/ Medical Decision Making/ A&P Clinical Course as of 05/18/23 2245  Wed May 18, 2023  2049 [id number] -- Saraiah Braye [CP]  2058 Aristada 662 mg -- supposed to be every 30 days but has been without it [CP]    Clinical Course User Index [CP] Olene Floss, PA-C                             Medical Decision Making Amount and/or Complexity of Data Reviewed Labs: ordered.  Risk Prescription drug management.   Patient is a 67 y.o. female  who presents to the emergency department for psychiatric complaint.  Past Medical History: schizophrenia, chronic pain, previous cholecystectomy, abdominal hysterectomy  Physical Exam: Patient overall with poor insight, is alert and oriented to herself but seems confused on time, place, situation.  She is unable to accurately describe her living situation, I had corroborated reports from son and daughter that she lives in [place], daughter is currently her legal guardian, patient cannot tell me that she is supposed to be taking any medication, she denies any active SI, HI, AVH at this time but does seem to be responding to internal stimuli, signs of decompensated bipolar, schizophrenia  Vital signs are stable other than mild diastolic hypertension, blood pressure 131/110.  Labs: Medical clearance labs ordered, with following pertinent results: Screening labs are pending at this time.  Medications: I ordered medication including Ativan as needed for agitation. I have reviewed the patients home medicines and have made adjustments as needed.  Disposition: Patient is otherwise medically cleared at this time pending medical clearance laboratory evaluation. Will consult TTS and appreciate their recommendations.  Patient currently placed under IVC as she is not showing clear  insight, she is not her own legal guardian at this time, and I do believe that she needs a psychiatric evaluation before being cleared to return to her home.  I discussed this case with my attending physician Dr. Lynelle Doctor who cosigned this note including patient's presenting symptoms, physical exam, and planned diagnostics and interventions. Attending physician stated agreement with plan or made changes to plan which were implemented.   Final Clinical Impression(s) / ED Diagnoses Final diagnoses:  Psychosis, unspecified psychosis type [facility])    Rx / DC Orders ED Discharge Orders     None         [place] 05/18/23 2245    Linwood Dibbles, MD 05/18/23 2344

## 2023-05-18 NOTE — ED Triage Notes (Signed)
Patient arrives with all of her belongings for eval of ongoing knee pain x 2-3 months. Was seen yesterday for same and discharged with ortho follow up. Patient is under the impression she is supposed to have surgery here today. Walking without difficulty, lifting boxes and and belongings without any difficulty.

## 2023-05-18 NOTE — Progress Notes (Signed)
CSW added resources as well informed nurse to provide the patient with transport. At this time there are no further TOC needs.

## 2023-05-18 NOTE — BH Assessment (Signed)
[facility] will have Dr. Alto Denver see patient at 01:00.  Jerene Dilling with [facility] will be the coordinator.

## 2023-05-18 NOTE — ED Triage Notes (Signed)
Pt was seen earlier and discharged with resources for the homeless shelter. Pt reported at discharge that her son was coming to get her for her to stay with him until something opens up a group home; information and symptoms vague on assessment

## 2023-05-18 NOTE — ED Notes (Signed)
IVC paperwork completed by [place] PA.

## 2023-05-18 NOTE — ED Provider Notes (Signed)
[place] EMERGENCY DEPARTMENT AT [facility] Provider Note   CSN: [id number] Arrival date & time: 05/18/23  1718     History  Chief Complaint  Patient presents with   Knee Pain    Felicia Collier is a 67 y.o. female with past medical history significant for schizophrenia, chronic pain, previous cholecystectomy, abdominal hysterectomy who presents with concern for ongoing left-sided knee pain as well as right ankle pain for the last 2 to 3 months.  She was seen yesterday for the same, discharged with Ortho follow-up.  Patient arrives somewhat confused, had mention that she was supposed to have surgery here today, she reports that she got kicked out of her apartment, lights and wander shut off, and she arrived with most of her belongings.  She has not taken anything for pain prior to arrival.  At this time the security team are watching most of her belongings pending disposition for this patient.   Knee Pain      Home Medications Prior to Admission medications   Medication Sig Start Date End Date Taking? Authorizing Provider  acetaminophen (TYLENOL) 325 MG tablet Take 325 mg by mouth every 6 (six) hours as needed for moderate pain.    [provider]  benzonatate (TESSALON) 100 MG capsule Take 1 capsule (100 mg total) by mouth 2 (two) times daily as needed for cough. 10/30/19   Kallie Locks, FNP  diclofenac Sodium (VOLTAREN) 1 % GEL Apply 4 g topically 4 (four) times daily. 05/17/23   Arthor Captain, PA-C  gabapentin (NEURONTIN) 100 MG capsule Take 1 capsule (100 mg total) by mouth 3 (three) times daily. Patient not taking: Reported on 03/23/2019 03/15/19   Raeford Razor, MD  hyoscyamine (LEVSIN/SL) 0.125 MG SL tablet Place 1 tablet (0.125 mg total) under the tongue every 6 (six) hours as needed for cramping (abdominal pain). Up to 1.25 mg daily 05/17/23   Arthor Captain, PA-C  loratadine (CLARITIN) 10 MG tablet Take 1 tablet (10 mg total) by mouth daily. Patient  not taking: Reported on 10/08/2019 05/20/19   Palumbo, April, MD  meloxicam (MOBIC) 15 MG tablet Take 1 tablet daily as needed for pain. Patient not taking: Reported on 03/07/2019 03/01/19   Molpus, Jonny Ruiz, MD  omeprazole (PRILOSEC) 20 MG capsule Take 1 capsule every morning at least 30 minutes before first dose of Carafate. Patient not taking: Reported on 03/07/2019 03/01/19   Molpus, John, MD  sucralfate (CARAFATE) 1 g tablet Take 1 tablet (1 g total) by mouth 4 (four) times daily. Patient not taking: Reported on 03/07/2019 03/01/19   Molpus, John, MD  sulfamethoxazole-trimethoprim (BACTRIM DS) 800-160 MG tablet Take 1 tablet by mouth 2 (two) times daily. Patient not taking: Reported on 10/08/2019 08/21/19   Kallie Locks, FNP  tiZANidine (ZANAFLEX) 4 MG capsule Take 1 capsule (4 mg total) by mouth 2 (two) times daily as needed for muscle spasms. Patient not taking: Reported on 03/23/2019 03/07/19   Kallie Locks, FNP      Allergies    Lidocaine    Review of Systems   Review of Systems  Musculoskeletal:  Positive for arthralgias.  All other systems reviewed and are negative.   Physical Exam Updated Vital Signs BP 133/74   Pulse 66   Temp 98.6 F (37 C) (Oral)   Resp 16   SpO2 97%  Physical Exam Vitals and nursing note reviewed.  Constitutional:      General: She is not in acute distress.  Appearance: Normal appearance.  HENT:     Head: Normocephalic and atraumatic.  Eyes:     General:        Right eye: No discharge.        Left eye: No discharge.  Cardiovascular:     Rate and Rhythm: Normal rate and regular rhythm.     Pulses: Normal pulses.     Comments: DP, PT pulses 2+ bilaterally Pulmonary:     Effort: Pulmonary effort is normal. No respiratory distress.  Musculoskeletal:        General: No deformity.     Comments: Overall benign appearance of left knee without significant effusion, anterior, posterior drawer laxity, or crepitus with range of motion.  Intact  strength 5/5 to flexion, extension, and patient ambulating and lifting boxes without difficulty.  Some tenderness with passive range of motion of her right ankle with intact strength plantarflexion, dorsiflexion.  Skin:    General: Skin is warm and dry.  Neurological:     Mental Status: She is alert and oriented to person, place, and time.  Psychiatric:        Mood and Affect: Mood normal.        Behavior: Behavior normal.     ED Results / Procedures / Treatments   Labs (all labs ordered are listed, but only abnormal results are displayed) Labs Reviewed - No data to display  EKG None  Radiology DG Knee Complete 4 Views Left  Result Date: 05/18/2023 CLINICAL DATA:  Several month history of ongoing knee pain EXAM: LEFT KNEE - COMPLETE 4 VIEW COMPARISON:  Left knee radiograph dated 02/14/2019 FINDINGS: There are no findings of fracture or dislocation. No joint effusion. Severe tricompartmental degenerative changes of the knee, most pronounced in the medial compartment. Well corticated ossific densities in the popliteal fossa may represent loose bodies. Soft tissues are unremarkable. IMPRESSION: Severe tricompartmental degenerative changes of the knee, most pronounced in the medial compartment. Electronically Signed   By: Agustin Cree M.D.   On: 05/18/2023 19:04   DG Ankle Complete Right  Result Date: 05/18/2023 CLINICAL DATA:  Ankle pain common no known injury, initial encounter EXAM: RIGHT ANKLE - COMPLETE 3+ VIEW COMPARISON:  None Available. FINDINGS: Mild soft tissue swelling is noted. Mild degenerative changes at the tibiotalar joint are seen. Mild calcaneal spurring is noted. No fracture is noted. IMPRESSION: Degenerative changes with soft tissue swelling. No acute bony abnormality noted. Electronically Signed   By: Alcide Clever M.D.   On: 05/18/2023 19:03    Procedures Procedures    Medications Ordered in ED Medications  acetaminophen (TYLENOL) tablet 650 mg (650 mg Oral Given 05/18/23  1816)  ibuprofen (ADVIL) tablet 800 mg (800 mg Oral Given 05/18/23 1816)    ED Course/ Medical Decision Making/ A&P                             Medical Decision Making  This patient is a 67 y.o. female who presents to the ED for concern of some ongoing left knee pain as well as right ankle pain.  Patient with history of schizophrenia and had some confusion on arrival, initially thinking that she may be getting surgery today, she was reportedly kicked out of her apartments and she has multiple bags and boxes of belongings with her in the ED today currently being watched by security.   Differential diagnoses prior to evaluation: Acute fracture, dislocation versus chronic knee, ankle pain, versus  other lower extremity vascular emergency such as DVT, arterial occlusion, venous insufficiency, versus other  Past Medical History / Social History / Additional history: Chart reviewed. Pertinent results include: schizophrenia, chronic pain, previous cholecystectomy, abdominal hysterectomy  Physical Exam: Physical exam performed. The pertinent findings include:  deformity.     Comments: Overall benign appearance of left knee without significant effusion, anterior, posterior drawer laxity, or crepitus with range of motion.  Intact strength 5/5 to flexion, extension, and patient ambulating and lifting boxes without difficulty.  Some tenderness with passive range of motion of her right ankle with intact strength plantarflexion, dorsiflexion.   I independently interpreted imaging including plain film radiographs of the left knee, right ankle which shows significant tricompartmental degenerative changes in the knee, overall benign appearing ankle with some degenerative changes noted. I agree with the radiologist interpretation.  Medications / Treatment: Tylenol, ibuprofen for pain  Consults: I spoke with the social worker, [place], and after discussion of patient's circumstances she recommended shelter  resources, and we can provide a cab voucher for this patient if she has a specific destination that she is going to.  I spoke with the patient again and she reports that her son is coming to pick her up.  Confirmed this with patient and she does repeat that son is coming to pick her and her belongings up.   Disposition: After consideration of the diagnostic results and the patients response to treatment, I feel that patient with acute on chronic knee pain, ankle pain.  I agree with plan to refill her Voltaren prescription, and encourage orthopedic follow-up.  No new acute changes today other than updated imaging.   emergency department workup does not suggest an emergent condition requiring admission or immediate intervention beyond what has been performed at this time. The plan is: as above. The patient is safe for discharge and has been instructed to return immediately for worsening symptoms, change in symptoms or any other concerns.  Final Clinical Impression(s) / ED Diagnoses Final diagnoses:  Chronic pain of left knee  Acute right ankle pain    Rx / DC Orders ED Discharge Orders     None         [place] 05/18/23 1947    Linwood Dibbles, MD 05/19/23 1504

## 2023-05-18 NOTE — Discharge Instructions (Addendum)
Please follow up with [facility] for senior housing.  Please use Tylenol or ibuprofen for pain.  You may use 600 mg ibuprofen every 6 hours or 1000 mg of Tylenol every 6 hours.  You may choose to alternate between the 2.  This would be most effective.  Not to exceed 4 g of Tylenol within 24 hours.  Not to exceed 3200 mg ibuprofen 24 hours.

## 2023-05-18 NOTE — ED Notes (Signed)
Collateral information obtained from PA: Pt is homeless with hx of schizophrenia, bipolar, dementia. Has been noncompliant with medications including injections. Pt needing further psychiatric evaluation; currently denies SI/HI/ hallucinations

## 2023-05-19 ENCOUNTER — Encounter (HOSPITAL_COMMUNITY): Payer: Self-pay | Admitting: Psychiatric/Mental Health

## 2023-05-19 DIAGNOSIS — F203 Undifferentiated schizophrenia: Secondary | ICD-10-CM

## 2023-05-19 DIAGNOSIS — Z5900 Homelessness unspecified: Secondary | ICD-10-CM

## 2023-05-19 NOTE — ED Notes (Signed)
Spoke with son [place] [phone]) regarding pt's pending discharge, son states he is at work until [employer] and he works two hours from home. Received sister(Veronica [phone]) contact number, message left to return call. Son voiced to this nurse that sister Suzette Battiest) is legal guardian of pt.

## 2023-05-19 NOTE — ED Provider Notes (Signed)
Emergency Medicine Observation Re-evaluation Note  Felicia Collier is a 67 y.o. female, seen on rounds today.  Pt initially presented to the ED for complaints of Homeless and Psychiatric Evaluation Currently, the patient is resting quietly.  Physical Exam  BP (!) 152/77 (BP Location: Left Arm)   Pulse 61   Temp 97.9 F (36.6 C) (Oral)   Resp 18   SpO2 100%  Physical Exam General: No acute distress Cardiac: Well-perfused Lungs: Nonlabored Psych: Calm  ED Course / MDM  EKG:   I have reviewed the labs performed to date as well as medications administered while in observation.  Recent changes in the last 24 hours include TTS attempted to evaluate patient twice but she was sleeping.  Plan  Current plan is for TTS evaluation.  She apparently has not been taking her Depo psych med.  This has not been ordered as of yet.  Awaiting psychiatric recommendations.  Patient has been seen and psychiatrically cleared by psychiatry.  They have reached out to patient's daughter and son and it sounds like family members are here to take the patient home.    Terrilee Files, MD 05/19/23 1901

## 2023-05-19 NOTE — Consult Note (Addendum)
[facility] ED ASSESSMENT   Reason for Consult:  Psych Consult Referring Physician:   Olene Floss, PA-C   Patient Identification: Felicia Collier MRN:  [id number] ED Chief Complaint: Schizophrenia [facility])  Diagnosis:  Principal Problem:   Schizophrenia (HCC) Active Problems:   Homelessness   ED Assessment Time Calculation: Start Time: 0800 Stop Time: 0830 Total Time in Minutes (Assessment Completion): 30   Subjective:    Felicia Collier is a 67 y.o. AA female with a past psychiatric history of schizophrenia, and pertinent medical comorbidities/history that include chronic pain, previous cholecystectomy, and abdominal hysterectomy, who arrives this encounter after x2 emergency department visits for a recrudescence of physical pain in the patient's knees, and upon pending discharge when family arrived, the family stated that patient has been noncompliant with psychiatric medications for historical diagnosis of schizophrenia, been exhibiting increasingly erratic behavior, and they have concerns for poor decision-making and danger to herself.  Patient currently medically clear and voluntary, IVC was notably declined by the magistrate's office.  HPI:    Patient seen face-to-face for psychiatric evaluation at [facility] emergency department.  Upon evaluation, patient tells me that she came to the hospital by way of taxi voluntarily after she was told that she was being evicted from her apartment, states that she came to the hospital for shelter and to have her medical complaints addressed.  Patient reports that her medical complaints have been addressed, still has some underlying generalized pain, but states that this is normal for her, and also that she has PCP follow-up visit for tomorrow at family Medicare PCP office.  Patient tells me that she does have psychiatric history, reports that she is followed up by [facility], but that she is compliant with her medications, tells me she takes a variety of  medications that she is instructed to take, tells me she is unclear of all of the medications that she takes, but states assertively but politely that she takes all of them.  Patient orientation is intact, no appreciable deficits, no fluctuations of attention or consciousness.  Patient does not present with any psychotic features objectively, does not present with any delusional themes that are appreciable, and/or any expressions of paranoid ideations, and/or any ideas of reference.  Patient does not endorse any suicidal or homicidal ideations, past or present, as well as denies any history of self injures behavior.  Patient endorses no history of mental health hospitalizations.  Patient reports today she has no psychiatric complaints.  Patient reports a chief complaint is she would like help being facilitated to get housing.  Patient endorses that her children are not her guardians, does not understand why they are saying that.  Patient endorses no drug use, tobacco use, and/or EtOH use.  Collateral, Daughter, Suzette Battiest, [phone] [phone] no answer from this provider, but does answer nursing when called.  Per nursing, daughter reports that she is the guardian and that she will come pick her up if the patient is amenable to returning to her home in [place], otherwise she will not come get her.  Patient reports that she wants to go home with son, does not want to go with daughter and states that she is refusing.  Discussed with nursing that it is unclear at this time if the patient is actually under the guardianship of daughter, however, the patient is psychiatrically cleared.  Collateral, Son, Demetris, [phone] [phone] no answer from this provider, but does answer nursing when called.  Son reports that he will pick  up the patient, but he works until 3, and lives 2 hours from home.  Son reportedly to nursing also states that the daughter of the patient is the legal guardian, though evidence of this has not been  shown yet.  Past Psychiatric History: Schizophrenia  Risk to Self or Others: Is the patient at risk to self? No Has the patient been a risk to self in the past 6 months? No Has the patient been a risk to self within the distant past? No Is the patient a risk to others? No Has the patient been a risk to others in the past 6 months? No Has the patient been a risk to others within the distant past? No  [place] Scale:  Flowsheet Row ED from 05/18/2023 in [facility] Emergency Department at [facility] Most recent reading at 05/18/2023  8:59 PM ED from 05/18/2023 in [facility] Emergency Department at [facility] Most recent reading at 05/18/2023  5:56 PM ED from 05/17/2023 in [facility] Emergency Department at [facility] Most recent reading at 05/17/2023 10:55 AM  C-SSRS RISK CATEGORY No Risk No Risk No Risk       Substance Abuse:  None endorsed  Past Medical History:  Past Medical History:  Diagnosis Date   Abdominal pain    Chronic back pain    Chronic knee pain    Diverticulitis    Schizophrenia (HCC)     Past Surgical History:  Procedure Laterality Date   ABDOMINAL HYSTERECTOMY     CHOLECYSTECTOMY     KNEE SURGERY Left    WRIST SURGERY Left    Family History:  Family History  Problem Relation Age of Onset   Other Mother        no health conditions per patient   Other Father        no health conditions per patient   Family Psychiatric  History: None endorsed Social History:  Social History   Substance and Sexual Activity  Alcohol Use No     Social History   Substance and Sexual Activity  Drug Use No    Social History   Socioeconomic History   Marital status: Single    Spouse name: Not on file   Number of children: Not on file   Years of education: Not on file   Highest education level: Not on file  Occupational History   Not on file  Tobacco Use   Smoking status: Never   Smokeless tobacco: Never  Vaping Use   Vaping Use: Never  used  Substance and Sexual Activity   Alcohol use: No   Drug use: No   Sexual activity: Not Currently  Other Topics Concern   Not on file  Social History Narrative   Not on file   Social Determinants of Health   Financial Resource Strain: Not on file  Food Insecurity: Not on file  Transportation Needs: Not on file  Physical Activity: Not on file  Stress: Not on file  Social Connections: Not on file   Additional Social History:    Allergies:   Allergies  Allergen Reactions   Lidocaine     Face swelling     Labs:  Results for orders placed or performed during the hospital encounter of 05/18/23 (from the past 48 hour(s))  Basic metabolic panel     Status: Abnormal   Collection Time: 05/18/23 11:12 PM  Result Value Ref Range   Sodium 140 135 - 145 mmol/L  Potassium 3.4 (L) 3.5 - 5.1 mmol/L   Chloride 103 98 - 111 mmol/L   CO2 23 22 - 32 mmol/L   Glucose, Bld 97 70 - 99 mg/dL    Comment: Glucose reference range applies only to samples taken after fasting for at least 8 hours.   BUN 13 8 - 23 mg/dL   Creatinine, Ser 1.61 0.44 - 1.00 mg/dL   Calcium 9.3 8.9 - 09.6 mg/dL   GFR, Estimated >04 >54 mL/min    Comment: (NOTE) Calculated using the CKD-EPI Creatinine Equation (2021)    Anion gap 14 5 - 15    Comment: Performed at [facility], [address] [place], [place] [address]  CBC     Status: Abnormal   Collection Time: 05/18/23 11:12 PM  Result Value Ref Range   WBC 10.9 (H) 4.0 - 10.5 K/uL   RBC 4.81 3.87 - 5.11 MIL/uL   Hemoglobin 12.4 12.0 - 15.0 g/dL   HCT 91.4 78.2 - 95.6 %   MCV 83.4 80.0 - 100.0 fL   MCH 25.8 (L) 26.0 - 34.0 pg   MCHC 30.9 30.0 - 36.0 g/dL   RDW 21.3 08.6 - 57.8 %   Platelets 344 150 - 400 K/uL   nRBC 0.0 0.0 - 0.2 %    Comment: Performed at [facility], [address], [place], [place] [address]  Acetaminophen level     Status: None   Collection Time: 05/18/23 11:12 PM  Result Value Ref Range   Acetaminophen  (Tylenol), Serum 10 10 - 30 ug/mL    Comment: (NOTE) Therapeutic concentrations vary significantly. A range of 10-30 ug/mL  may be an effective concentration for many patients. However, some  are best treated at concentrations outside of this range. Acetaminophen concentrations >150 ug/mL at 4 hours after ingestion  and >50 ug/mL at 12 hours after ingestion are often associated with  toxic reactions.  Performed at [facility], [address], [place], [place] [address]   Ethanol     Status: None   Collection Time: 05/18/23 11:12 PM  Result Value Ref Range   Alcohol, Ethyl (B) <10 <10 mg/dL    Comment: (NOTE) Lowest detectable limit for serum alcohol is 10 mg/dL.  For medical purposes only. Performed at [facility], [address] [place], [place] [address]   Salicylate level     Status: Abnormal   Collection Time: 05/18/23 11:12 PM  Result Value Ref Range   Salicylate Lvl <7.0 (L) 7.0 - 30.0 mg/dL    Comment: Performed at [facility], [address], [place], [place] [address]    No current facility-administered medications for this encounter.   Current Outpatient Medications  Medication Sig Dispense Refill   acetaminophen (TYLENOL) 325 MG tablet Take 325 mg by mouth every 6 (six) hours as needed for moderate pain.     benzonatate (TESSALON) 100 MG capsule Take 1 capsule (100 mg total) by mouth 2 (two) times daily as needed for cough. 20 capsule 0   diclofenac Sodium (VOLTAREN) 1 % GEL Apply 4 g topically 4 (four) times daily. 350 g 1   gabapentin (NEURONTIN) 100 MG capsule Take 1 capsule (100 mg total) by mouth 3 (three) times daily. (Patient not taking: Reported on 03/23/2019) 21 capsule 0   hyoscyamine (LEVSIN/SL) 0.125 MG SL tablet Place 1 tablet (0.125 mg total) under the tongue every 6 (six) hours as needed for cramping (abdominal pain). Up  to 1.25 mg daily 20 tablet 0   loratadine (CLARITIN) 10 MG tablet Take 1 tablet (10 mg total) by mouth daily.  (Patient not taking: Reported on 10/08/2019) 30 tablet 0   meloxicam (MOBIC) 15 MG tablet Take 1 tablet daily as needed for pain. (Patient not taking: Reported on 03/07/2019) 30 tablet 0   omeprazole (PRILOSEC) 20 MG capsule Take 1 capsule every morning at least 30 minutes before first dose of Carafate. (Patient not taking: Reported on 03/07/2019) 30 capsule 0   sucralfate (CARAFATE) 1 g tablet Take 1 tablet (1 g total) by mouth 4 (four) times daily. (Patient not taking: Reported on 03/07/2019) 40 tablet 0   sulfamethoxazole-trimethoprim (BACTRIM DS) 800-160 MG tablet Take 1 tablet by mouth 2 (two) times daily. (Patient not taking: Reported on 10/08/2019) 14 tablet 0   tiZANidine (ZANAFLEX) 4 MG capsule Take 1 capsule (4 mg total) by mouth 2 (two) times daily as needed for muscle spasms. (Patient not taking: Reported on 03/23/2019) 30 capsule 2    Musculoskeletal: Strength & Muscle Tone: within normal limits Gait & Station: normal Patient leans: N/A   Psychiatric Specialty Exam: Presentation  General Appearance:  Appropriate for Environment  Eye Contact: Good  Speech: Other (comment) (Mostly clear and able to be understood, variable brief periods of poor articulation)  Speech Volume: Normal  Handedness: Right   Mood and Affect  Mood: Euthymic  Affect: Other (comment) (Relaxed and neutral)   Thought Process  Thought Processes: Coherent; Goal Directed; Other (comment) (Linear to circumstantial)  Descriptions of Associations:Circumstantial (Variable)  Orientation:Full (Time, Place and Person)  Thought Content:Logical  History of Schizophrenia/Schizoaffective disorder:No data recorded Duration of Psychotic Symptoms:No data recorded Hallucinations:Hallucinations: None  Ideas of Reference:None  Suicidal Thoughts:Suicidal Thoughts: No  Homicidal Thoughts:Homicidal Thoughts: No   Sensorium  Memory: Immediate Fair; Recent Fair; Remote  Fair  Judgment: Intact  Insight: Fair   Chartered certified accountant: Fair  Attention Span: Fair  Recall: [employer] of Knowledge: Fair  Language: Fair   Psychomotor Activity  Psychomotor Activity: Psychomotor Activity: Normal   Assets  Assets: Communication Skills; Social Support; Physical Health; Resilience    Sleep  Sleep: Sleep: Good   Physical Exam: Physical Exam Vitals and nursing note reviewed.  Constitutional:      General: She is not in acute distress.    Appearance: She is not ill-appearing, toxic-appearing or diaphoretic.  Pulmonary:     Effort: Pulmonary effort is normal.  Neurological:     Mental Status: She is alert and oriented to person, place, and time. Mental status is at baseline.  Psychiatric:        Attention and Perception: She does not perceive auditory or visual hallucinations.        Mood and Affect: Mood normal.        Behavior: Behavior normal. Behavior is cooperative.        Thought Content: Thought content is not paranoid or delusional. Thought content does not include homicidal or suicidal ideation.        Cognition and Memory: Cognition and memory normal.    Review of Systems  Psychiatric/Behavioral:  Negative for depression, hallucinations, memory loss, substance abuse and suicidal ideas. The patient is not nervous/anxious and does not have insomnia.   All other systems reviewed and are negative.  Blood pressure (!) 152/77, pulse 61, temperature 97.9 F (36.6 C), temperature source Oral, resp. rate 18, SpO2 100 %. There is no height or weight on file to calculate  BMI.  Medical Decision Making:  Diagnostically, the patient does not present with any symptomology that is indicative of the patient presenting with a recrudescence of psychotic features, lacking capacity, and/or presenting as a imminent risk to herself or others.  Unclear as to why patient has been endorsed by family to have a severe decompensation of  her mental health, but at this time, patient does not meet IVC criteria and/or criteria for inpatient hospitalization. Patient tells me primary stressor outside of medical complaints is housing, endorses psychosocial stress from social determinants of health.  Patient tells me she has close follow-up with [facility] psychiatric services as well as primary  care services as early as tomorrow. Spoke with Dr. Lucianne Muss who agrees with plan of care.  #Schizophrenia # Homelessness  Recommendations  -Recommend discharge into care of family, if they are the patient's legal guardian, but patient at this time denies this -Continue home medications -Patient psychiatrically cleared  Disposition: No evidence of imminent risk to self or others at present.   Patient does not meet criteria for psychiatric inpatient admission. Supportive therapy provided about ongoing stressors. Discussed crisis plan, support from social network, calling 911, coming to the Emergency Department, and calling Suicide Hotline.  Lenox Ponds, NP 05/19/2023 9:05 AM

## 2023-05-19 NOTE — ED Notes (Signed)
Daughter present to ED for pt's discharge.  Pt received personal belonging bag from locker and secured envelope.

## 2023-05-19 NOTE — ED Notes (Signed)
Voice mail left for Veronica(dtr). Spoke with son, son is able to pick up pt after work at [employer]. Will call back at 2pm to confirm pick-up.

## 2023-05-19 NOTE — ED Notes (Signed)
Spoke with Veronica(dtr), daughter will come pick pt up from. ED

## 2023-05-19 NOTE — BH Assessment (Signed)
Per Tyler Aas, NP, patient would not wake up for assessment. RN to reach out if to [facility] via secure chat if patient becomes alert and can be assessed.   Manfred Arch, MSW, LCSW Triage Specialist [phone]

## 2023-05-19 NOTE — ED Notes (Addendum)
Veronica(dtr) aware of pt's pending discharge. Daughter states that she could come pick pt up and take pt to her home in [place] but will not be to take pt's personal belongings with them. Pt aware and voiced that she could not return to her home in [place]. Pt states she want to stay with son, states she is getting assistance in getting an apartment on Monday. Voice mail left for Veronica(dtr).

## 2023-06-23 ENCOUNTER — Emergency Department (HOSPITAL_COMMUNITY)
Admission: EM | Admit: 2023-06-23 | Discharge: 2023-06-23 | Disposition: A | Discharge status: Home / Self Care | Payer: Medicare Other | Attending: Emergency Medicine | Admitting: Emergency Medicine

## 2023-06-23 ENCOUNTER — Other Ambulatory Visit: Payer: Self-pay

## 2023-06-23 ENCOUNTER — Encounter (HOSPITAL_COMMUNITY): Payer: Self-pay

## 2023-06-23 ENCOUNTER — Emergency Department (HOSPITAL_COMMUNITY): Payer: Medicare Other

## 2023-06-23 DIAGNOSIS — G8929 Other chronic pain: Secondary | ICD-10-CM | POA: Diagnosis not present

## 2023-06-23 DIAGNOSIS — D72829 Elevated white blood cell count, unspecified: Secondary | ICD-10-CM | POA: Insufficient documentation

## 2023-06-23 DIAGNOSIS — E876 Hypokalemia: Secondary | ICD-10-CM | POA: Diagnosis not present

## 2023-06-23 DIAGNOSIS — M25511 Pain in right shoulder: Secondary | ICD-10-CM | POA: Insufficient documentation

## 2023-06-23 DIAGNOSIS — M25562 Pain in left knee: Secondary | ICD-10-CM | POA: Insufficient documentation

## 2023-06-23 LAB — URINALYSIS, ROUTINE W REFLEX MICROSCOPIC
Bilirubin Urine: NEGATIVE
Glucose, UA: NEGATIVE mg/dL
Hgb urine dipstick: NEGATIVE
Ketones, ur: NEGATIVE mg/dL
Leukocytes,Ua: NEGATIVE
Nitrite: NEGATIVE
Protein, ur: NEGATIVE mg/dL
Specific Gravity, Urine: 1.004 — ABNORMAL LOW (ref 1.005–1.030)
pH: 6 (ref 5.0–8.0)

## 2023-06-23 LAB — COMPREHENSIVE METABOLIC PANEL
ALT: 14 U/L (ref 0–44)
AST: 18 U/L (ref 15–41)
Albumin: 3.7 g/dL (ref 3.5–5.0)
Alkaline Phosphatase: 107 U/L (ref 38–126)
Anion gap: 9 (ref 5–15)
BUN: 6 mg/dL — ABNORMAL LOW (ref 8–23)
CO2: 25 mmol/L (ref 22–32)
Calcium: 9.2 mg/dL (ref 8.9–10.3)
Chloride: 103 mmol/L (ref 98–111)
Creatinine, Ser: 0.71 mg/dL (ref 0.44–1.00)
GFR, Estimated: >60 mL/min (ref >60)
Glucose, Bld: 95 mg/dL (ref 70–99)
Potassium: 2.9 mmol/L — ABNORMAL LOW (ref 3.5–5.1)
Sodium: 137 mmol/L (ref 135–145)
Total Bilirubin: 0.8 mg/dL (ref 0.3–1.2)
Total Protein: 7.2 g/dL (ref 6.5–8.1)

## 2023-06-23 LAB — LIPASE, BLOOD: Lipase: 38 U/L (ref 11–51)

## 2023-06-23 LAB — CBC
HCT: 39.8 % (ref 36.0–46.0)
Hemoglobin: 12.1 g/dL (ref 12.0–15.0)
MCH: 24.8 pg — ABNORMAL LOW (ref 26.0–34.0)
MCHC: 30.4 g/dL (ref 30.0–36.0)
MCV: 81.7 fL (ref 80.0–100.0)
Platelets: 354 10*3/uL (ref 150–400)
RBC: 4.87 MIL/uL (ref 3.87–5.11)
RDW: 14.5 % (ref 11.5–15.5)
WBC: 12.7 10*3/uL — ABNORMAL HIGH (ref 4.0–10.5)
nRBC: 0 % (ref 0.0–0.2)

## 2023-06-23 MED ORDER — IBUPROFEN 400 MG PO TABS
600.0000 mg | ORAL_TABLET | Freq: Once | ORAL | Status: AC
  Administered 2023-06-23: 600 mg via ORAL
  Filled 2023-06-23: qty 1

## 2023-06-23 MED ORDER — ACETAMINOPHEN 325 MG PO TABS
650.0000 mg | ORAL_TABLET | Freq: Once | ORAL | Status: AC
  Administered 2023-06-23: 650 mg via ORAL
  Filled 2023-06-23: qty 2

## 2023-06-23 MED ORDER — POTASSIUM CHLORIDE CRYS ER 20 MEQ PO TBCR
80.0000 meq | EXTENDED_RELEASE_TABLET | Freq: Once | ORAL | Status: AC
  Administered 2023-06-23: 80 meq via ORAL
  Filled 2023-06-23: qty 4

## 2023-06-23 NOTE — Discharge Instructions (Signed)
As we discussed, your workup in the ER today was reassuring for acute findings.  Your potassium is a little low but this is likely due to the diarrhea you have been having for some time.  Given that your symptoms all seem to be chronic in nature, I recommend that you follow-up closely outpatient for continued evaluation and management of your symptoms.  I have given you a referral to wellness center where you can follow-up.  Please call them at your convenience to schedule an appointment.  Please ensure that you are eating foods high in potassium and drinking liquids with electrolytes to help fix your low potassium.  You may take Imodium for your diarrhea which she can get over-the-counter.  You can also take Tylenol/ibuprofen as needed for your chronic pain.  Return if development of any new or worsening symptoms.

## 2023-06-23 NOTE — ED Triage Notes (Signed)
Pt c/o bilateral chronic leg pain and R sided abd pain x 1 week; no known injury; abd pain worse when being over; denies N/V, denies fevers, denies known sick contacts

## 2023-06-23 NOTE — ED Provider Notes (Signed)
[place] EMERGENCY DEPARTMENT AT [facility] Provider Note   CSN: [id number] Arrival date & time: 06/23/23  1059     History  No chief complaint on file.   Felicia Collier is a 67 y.o. female.  Patient with history of schizophrenia, chronic pain, previous cholecystectomy, abdominal hysterectomy presents today with complaints of left knee and right shoulder pain. She states that her pain has been ongoing for the past several months. She has been seen here several times for this in the past month and has been sent to ortho but has not seen them yet.  Patient is extremely difficult historian, chart reviewed appears that this is baseline likely due to her low health literacy and mental health problems. She does note that she has been able to walk with some discomfort. She also notes intermittent abdominal pain that has also been ongoing for several months. States that she has some loose stools when she eats for the past several months. She has not seen anyone for this or taken anything for her symptoms. Denies recent antibiotics.  Denies headache, vision changes, chest pain, shortness of breath, nausea, vomiting, or urinary symptoms.    The history is provided by the patient. No language interpreter was used.       Home Medications Prior to Admission medications   Medication Sig Start Date End Date Taking? Authorizing Provider  diclofenac Sodium (VOLTAREN) 1 % GEL Apply 4 g topically 4 (four) times daily. Patient not taking: Reported on 05/19/2023 05/17/23   Arthor Captain, PA-C  hyoscyamine (LEVSIN/SL) 0.125 MG SL tablet Place 1 tablet (0.125 mg total) under the tongue every 6 (six) hours as needed for cramping (abdominal pain). Up to 1.25 mg daily Patient not taking: Reported on 05/19/2023 05/17/23   Arthor Captain, PA-C      Allergies    Lidocaine    Review of Systems   Review of Systems  Musculoskeletal:  Positive for arthralgias.  All other systems reviewed and are  negative.   Physical Exam Updated Vital Signs BP 135/69   Pulse 66   Temp 97.7 F (36.5 C) (Oral)   Resp 18   Ht 5\' 5"  (1.651 m)   Wt 71.7 kg   SpO2 99%   BMI 26.29 kg/m  Physical Exam Vitals and nursing note reviewed.  Constitutional:      General: She is not in acute distress.    Appearance: Normal appearance. She is normal weight. She is not ill-appearing, toxic-appearing or diaphoretic.  HENT:     Head: Normocephalic and atraumatic.  Eyes:     Extraocular Movements: Extraocular movements intact.     Pupils: Pupils are equal, round, and reactive to light.  Cardiovascular:     Rate and Rhythm: Normal rate and regular rhythm.     Heart sounds: Normal heart sounds.  Pulmonary:     Effort: Pulmonary effort is normal. No respiratory distress.     Breath sounds: Normal breath sounds.  Abdominal:     General: Abdomen is flat.     Palpations: Abdomen is soft.     Tenderness: There is no abdominal tenderness.  Musculoskeletal:        General: Normal range of motion.     Cervical back: Normal range of motion and neck supple.     Right lower leg: No edema.     Left lower leg: No edema.     Comments: No midline tenderness to palpation of cervical, thoracic, or lumbar spine.  No  step-offs, lesions, deformity, or overlying skin changes.  5/5 strength and sensation intact in bilateral upper and lower extremities.  Patient observed to be ambulatory with a steady gait.  No focal bony tenderness to the right shoulder.  Tenderness noted to palpation of the musculature between the neck and shoulder area.  Radial pulses intact and 2+.  Generalized tenderness to palpation of the left knee throughout without deformity, swelling, erythema, or warmth.  No ligamentous laxity.  ROM intact.  DP and PT pulses intact and 2+.   Skin:    General: Skin is warm and dry.  Neurological:     General: No focal deficit present.     Mental Status: She is alert and oriented to person, place, and time.   Psychiatric:        Mood and Affect: Mood normal.        Behavior: Behavior normal.     ED Results / Procedures / Treatments   Labs (all labs ordered are listed, but only abnormal results are displayed) Labs Reviewed  COMPREHENSIVE METABOLIC PANEL - Abnormal; Notable for the following components:      Result Value   Potassium 2.9 (*)    BUN 6 (*)    All other components within normal limits  CBC - Abnormal; Notable for the following components:   WBC 12.7 (*)    MCH 24.8 (*)    All other components within normal limits  URINALYSIS, ROUTINE W REFLEX MICROSCOPIC - Abnormal; Notable for the following components:   Color, Urine COLORLESS (*)    Specific Gravity, Urine 1.004 (*)    All other components within normal limits  LIPASE, BLOOD    EKG None  Radiology DG Chest 2 View  Result Date: 06/23/2023 CLINICAL DATA:  RUQ abd pain r/o RLL pna EXAM: CHEST - 2 VIEW COMPARISON:  10/29/2019. FINDINGS: Bilateral lung fields are clear. Bilateral costophrenic angles are clear. Normal cardio-mediastinal silhouette. No acute osseous abnormalities. The soft tissues are within normal limits. There are surgical clips in the right upper quadrant, typical of a previous cholecystectomy. IMPRESSION: No active cardiopulmonary disease. Electronically Signed   By: Jules Schick M.D.   On: 06/23/2023 13:20    Procedures Procedures    Medications Ordered in ED Medications - No data to display  ED Course/ Medical Decision Making/ A&P                                 Medical Decision Making Amount and/or Complexity of Data Reviewed Labs: ordered.   This patient is a 67 y.o. female who presents to the ED for concern of bodyaches, this involves an extensive number of treatment options, and is a complaint that carries with it a high risk of complications and morbidity. The emergent differential diagnosis prior to evaluation includes, but is not limited to,  chronic pain, electrolyte derangement .  This is not an exhaustive differential.   Past Medical History / Co-morbidities / Social History: Low health literacy, no PCP, history of schizophrenia, chronic pain, previous cholecystectomy, abdominal hysterectomy  Additional history: Chart reviewed. Pertinent results include: Patient seen in this emergency department dating back greater than 10 years for chronic pain specifically to the left knee and right shoulder.  Physical Exam: Physical exam performed. The pertinent findings include: Per above, no acute findings  Lab Tests: I ordered, and personally interpreted labs.  The pertinent results include:  WBC 12.7, K 2.9, history  of previous.  UA noninfectious   Medications: I ordered medication including Tylenol/ibuprofen, oral potassium for chronic pain, hypokalemia. Reevaluation of the patient after these medicines showed that the patient improved. I have reviewed the patients home medicines and have made adjustments as needed.   Disposition: After consideration of the diagnostic results and the patients response to treatment, I feel that emergency department workup does not suggest an emergent condition requiring admission or immediate intervention beyond what has been performed at this time. The plan is: Discharge with information about the wellness center to follow-up with to have her potassium rechecked.  Appears patient's symptoms are chronic given that she used to be seen almost daily several years ago for similar symptoms. Evaluation and diagnostic testing in the emergency department does not suggest an emergent condition requiring admission or immediate intervention beyond what has been performed at this time.  Plan for discharge with close PCP follow-up.  Patient is understanding and amenable with plan, educated on red flag symptoms that would prompt immediate return.  Patient discharged in stable condition.  Final Clinical Impression(s) / ED Diagnoses Final diagnoses:  Chronic  pain of left knee  Chronic right shoulder pain  Hypokalemia    Rx / DC Orders ED Discharge Orders     None     An After Visit Summary was printed and given to the patient.     Vear Clock 06/23/23 1831    Rondel Baton, MD 06/24/23 [phone]

## 2023-06-23 NOTE — ED Provider Triage Note (Signed)
Emergency Medicine Provider Triage Evaluation Note  Felicia Collier , a 67 y.o. female  was evaluated in triage.  Pt complains of neck pain on the right and right upper quadrant abdominal pain.  Patient is a terrible historian but states that she has pain in her right upper quadrant when she leans over.  She denies shortness of breath and she is Planing of pain in the right side of her neck..  Does not know her surgical history.  When asked about the pain in the right side of her neck she states "something jumped out of my left shoulder and then jumped out of my right shoulder."  Review of Systems  Positive: Abdominal pain neck pain Negative: Fever  Physical Exam  BP (!) 155/82 (BP Location: Left Arm)   Pulse 76   Temp 98.2 F (36.8 C) (Oral)   Resp 18   Ht 5\' 5"  (1.651 m)   Wt 71.7 kg   SpO2 97%   BMI 26.29 kg/m  Gen:   Awake, no distress   Resp:  Normal effort  MSK:   Moves extremities without difficulty  Other:  Palpation right upper quadrant of the abdomen  Medical Decision Making  Medically screening exam initiated at 12:29 PM.  Appropriate orders placed.  Felicia Collier was informed that the remainder of the evaluation will be completed by another provider, this initial triage assessment does not replace that evaluation, and the importance of remaining in the ED until their evaluation is complete.     Arthor Captain, PA-C 06/23/23 1232

## 2023-07-01 ENCOUNTER — Emergency Department (HOSPITAL_COMMUNITY): Payer: Medicare Other

## 2023-07-01 ENCOUNTER — Emergency Department (HOSPITAL_COMMUNITY)
Admission: EM | Admit: 2023-07-01 | Discharge: 2023-07-01 | Disposition: A | Discharge status: Home / Self Care | Payer: Medicare Other | Attending: Emergency Medicine | Admitting: Emergency Medicine

## 2023-07-01 ENCOUNTER — Encounter (HOSPITAL_COMMUNITY): Payer: Self-pay | Admitting: *Deleted

## 2023-07-01 ENCOUNTER — Other Ambulatory Visit: Payer: Self-pay

## 2023-07-01 DIAGNOSIS — K449 Diaphragmatic hernia without obstruction or gangrene: Secondary | ICD-10-CM | POA: Diagnosis not present

## 2023-07-01 DIAGNOSIS — U071 COVID-19: Secondary | ICD-10-CM

## 2023-07-01 DIAGNOSIS — G8929 Other chronic pain: Secondary | ICD-10-CM | POA: Insufficient documentation

## 2023-07-01 DIAGNOSIS — D649 Anemia, unspecified: Secondary | ICD-10-CM | POA: Insufficient documentation

## 2023-07-01 DIAGNOSIS — N200 Calculus of kidney: Secondary | ICD-10-CM | POA: Diagnosis not present

## 2023-07-01 DIAGNOSIS — M25562 Pain in left knee: Secondary | ICD-10-CM | POA: Insufficient documentation

## 2023-07-01 DIAGNOSIS — R748 Abnormal levels of other serum enzymes: Secondary | ICD-10-CM | POA: Diagnosis not present

## 2023-07-01 DIAGNOSIS — E876 Hypokalemia: Secondary | ICD-10-CM

## 2023-07-01 DIAGNOSIS — M791 Myalgia, unspecified site: Secondary | ICD-10-CM | POA: Diagnosis present

## 2023-07-01 LAB — BASIC METABOLIC PANEL
Anion gap: 8 (ref 5–15)
BUN: 8 mg/dL (ref 8–23)
CO2: 27 mmol/L (ref 22–32)
Calcium: 8.6 mg/dL — ABNORMAL LOW (ref 8.9–10.3)
Chloride: 103 mmol/L (ref 98–111)
Creatinine, Ser: 0.71 mg/dL (ref 0.44–1.00)
GFR, Estimated: >60 mL/min (ref >60)
Glucose, Bld: 89 mg/dL (ref 70–99)
Potassium: 2.8 mmol/L — ABNORMAL LOW (ref 3.5–5.1)
Sodium: 138 mmol/L (ref 135–145)

## 2023-07-01 LAB — SARS CORONAVIRUS 2 BY RT PCR: SARS Coronavirus 2 by RT PCR: POSITIVE — AB

## 2023-07-01 LAB — CBC WITH DIFFERENTIAL/PLATELET
Abs Immature Granulocytes: 0.03 10*3/uL (ref 0.00–0.07)
Basophils Absolute: 0 10*3/uL (ref 0.0–0.1)
Basophils Relative: 0 %
Eosinophils Absolute: 0.2 10*3/uL (ref 0.0–0.5)
Eosinophils Relative: 2 %
HCT: 37.6 % (ref 36.0–46.0)
Hemoglobin: 11.5 g/dL — ABNORMAL LOW (ref 12.0–15.0)
Immature Granulocytes: 0 %
Lymphocytes Relative: 17 %
Lymphs Abs: 1.6 10*3/uL (ref 0.7–4.0)
MCH: 25.4 pg — ABNORMAL LOW (ref 26.0–34.0)
MCHC: 30.6 g/dL (ref 30.0–36.0)
MCV: 83.2 fL (ref 80.0–100.0)
Monocytes Absolute: 0.7 10*3/uL (ref 0.1–1.0)
Monocytes Relative: 8 %
Neutro Abs: 7 10*3/uL (ref 1.7–7.7)
Neutrophils Relative %: 73 %
Platelets: 316 10*3/uL (ref 150–400)
RBC: 4.52 MIL/uL (ref 3.87–5.11)
RDW: 14.4 % (ref 11.5–15.5)
WBC: 9.6 10*3/uL (ref 4.0–10.5)
nRBC: 0 % (ref 0.0–0.2)

## 2023-07-01 LAB — CK: Total CK: 347 U/L — ABNORMAL HIGH (ref 38–234)

## 2023-07-01 LAB — MAGNESIUM: Magnesium: 2.1 mg/dL (ref 1.7–2.4)

## 2023-07-01 MED ORDER — POTASSIUM CHLORIDE 10 MEQ/100ML IV SOLN
10.0000 meq | INTRAVENOUS | Status: AC
  Administered 2023-07-01 (×2): 10 meq via INTRAVENOUS
  Filled 2023-07-01 (×2): qty 100

## 2023-07-01 MED ORDER — LACTATED RINGERS IV BOLUS
1000.0000 mL | Freq: Once | INTRAVENOUS | Status: AC
  Administered 2023-07-01: 1000 mL via INTRAVENOUS

## 2023-07-01 MED ORDER — POTASSIUM CHLORIDE CRYS ER 20 MEQ PO TBCR
40.0000 meq | EXTENDED_RELEASE_TABLET | Freq: Once | ORAL | Status: AC
  Administered 2023-07-01: 40 meq via ORAL
  Filled 2023-07-01: qty 2

## 2023-07-01 NOTE — Discharge Instructions (Signed)
Make sure to follow-up outpatient  Return for new or worsening symptoms

## 2023-07-01 NOTE — ED Notes (Signed)
Pt taken to CT.

## 2023-07-01 NOTE — ED Triage Notes (Signed)
Patient presents to ed via [facility]  from [facility] with c/o flu like sx. Generalized bodyaches x 1 week. Was seen 1 week ago for same and dx. With flu.

## 2023-07-01 NOTE — ED Notes (Signed)
Pt noncompliant with potassium replacement therapy, wants to speak to the doctor. PA notified

## 2023-07-01 NOTE — ED Provider Notes (Signed)
[place] EMERGENCY DEPARTMENT AT [facility] Provider Note   CSN: [id number] Arrival date & time: 07/01/23  0736     History  Chief Complaint  Patient presents with   Generalized Body Aches    Felicia Collier is a 67 y.o. female history of schizophrenia, neuropathy, chronic joint pain here for evaluation of myalgias.  Patient here from [facility].  Has had some congestion, rhinorrhea and myalgias.  That began 2 days ago.  She has had generalized bodyaches x 1 week.  No fever, neck pain, chest pain, shortness of breath.  Mild nonproductive cough.  No abdominal pain.  No pain or swelling to legs.  States she has some chronic pain to her left knee, shoulder however this is chronic, unchanged.  When she was seen here last week she was noted to have potassium of 2.9.  Received supplementation in the ED however was not sent home on anything.  HPI     Home Medications Prior to Admission medications   Medication Sig Start Date End Date Taking? Authorizing Provider  diclofenac Sodium (VOLTAREN) 1 % GEL Apply 4 g topically 4 (four) times daily. Patient not taking: Reported on 05/19/2023 05/17/23   Arthor Captain, PA-C  hyoscyamine (LEVSIN/SL) 0.125 MG SL tablet Place 1 tablet (0.125 mg total) under the tongue every 6 (six) hours as needed for cramping (abdominal pain). Up to 1.25 mg daily Patient not taking: Reported on 05/19/2023 05/17/23   Arthor Captain, PA-C      Allergies    Lidocaine    Review of Systems   Review of Systems  Constitutional: Negative.   HENT:  Positive for congestion and rhinorrhea. Negative for sore throat, trouble swallowing and voice change.   Respiratory:  Positive for cough.   Cardiovascular: Negative.   Gastrointestinal: Negative.   Genitourinary: Negative.   Musculoskeletal:  Positive for myalgias.  Skin: Negative.   Neurological: Negative.   All other systems reviewed and are negative.   Physical Exam Updated Vital Signs BP (!) 155/70   Pulse (!) 52    Temp 97.8 F (36.6 C)   Resp 18   Ht 5\' 5"  (1.651 m)   Wt 74.8 kg   SpO2 98%   BMI 27.46 kg/m  Physical Exam Vitals and nursing note reviewed.  Constitutional:      General: She is not in acute distress.    Appearance: She is well-developed. She is not ill-appearing, toxic-appearing or diaphoretic.  HENT:     Head: Atraumatic.     Nose: Nose normal.     Mouth/Throat:     Mouth: Mucous membranes are moist.  Eyes:     Pupils: Pupils are equal, round, and reactive to light.  Cardiovascular:     Rate and Rhythm: Normal rate.     Pulses: Normal pulses.     Heart sounds: Normal heart sounds.  Pulmonary:     Effort: Pulmonary effort is normal. No respiratory distress.     Breath sounds: Normal breath sounds.  Abdominal:     General: Bowel sounds are normal. There is no distension.     Palpations: Abdomen is soft.  Musculoskeletal:        General: No swelling, tenderness, deformity or signs of injury. Normal range of motion.     Cervical back: Normal range of motion.     Right lower leg: No edema.     Left lower leg: No edema.  Skin:    General: Skin is warm and dry.  Capillary Refill: Capillary refill takes less than 2 seconds.  Neurological:     General: No focal deficit present.     Mental Status: She is alert and oriented to person, place, and time.  Psychiatric:        Mood and Affect: Mood normal.    ED Results / Procedures / Treatments   Labs (all labs ordered are listed, but only abnormal results are displayed) Labs Reviewed  SARS CORONAVIRUS 2 BY RT PCR - Abnormal; Notable for the following components:      Result Value   SARS Coronavirus 2 by RT PCR POSITIVE (*)    All other components within normal limits  CBC WITH DIFFERENTIAL/PLATELET - Abnormal; Notable for the following components:   Hemoglobin 11.5 (*)    MCH 25.4 (*)    All other components within normal limits  BASIC METABOLIC PANEL - Abnormal; Notable for the following components:   Potassium  2.8 (*)    Calcium 8.6 (*)    All other components within normal limits  CK - Abnormal; Notable for the following components:   Total CK 347 (*)    All other components within normal limits  MAGNESIUM    EKG None  Radiology CT Chest Wo Contrast  Result Date: 07/01/2023 CLINICAL DATA:  Abnormal xray - lung nodule, >= 1 cm FU on chest xray, cough. EXAM: CT CHEST WITHOUT CONTRAST TECHNIQUE: Multidetector CT imaging of the chest was performed following the standard protocol without IV contrast. RADIATION DOSE REDUCTION: This exam was performed according to the departmental dose-optimization program which includes automated exposure control, adjustment of the mA and/or kV according to patient size and/or use of iterative reconstruction technique. COMPARISON:  Chest radiograph 07/01/2023.  Chest CT 02/28/2016. FINDINGS: Cardiovascular: No significant vascular findings. Normal heart size. No pericardial effusion. Mediastinum/Nodes: No thoracic lymphadenopathy. Moderate upper tracheal diverticulum (axial image 14 series 4). Moderate hiatal hernia. Lungs/Pleura: The previously questioned nodular density is resolved to represent summation artifact. No suspicious lung nodules. Clear lungs. No pleural effusion or pneumothorax. Upper Abdomen: No acute findings. 3 mm calyceal calculus in the upper pole right kidney. Musculoskeletal: No chest wall mass or suspicious bone lesions identified. IMPRESSION: 1. The previously questioned nodular density is resolved to represent summation artifact. No suspicious lung nodules. 2. Moderate hiatal hernia. 3. Nonobstructing right nephrolithiasis. Electronically Signed   By: Orvan Falconer M.D.   On: 07/01/2023 12:38   DG Chest 2 View  Result Date: 07/01/2023 CLINICAL DATA:  Cough. EXAM: CHEST - 2 VIEW COMPARISON:  X-ray 06/23/2023 FINDINGS: No pneumothorax, effusion or edema. Normal cardiopericardial silhouette. Interstitial changes. There is a nodular asymmetric density  along the right midthorax overlying the anterior margin of the right third rib. This could be summation of shadow but recommend further workup with CT when appropriate to exclude underlying lesion. Surgical clips in the upper abdomen. IMPRESSION: There is a nodular asymmetric density along the right midthorax overlying the anterior margin of the right third rib. This could be summation of shadow but recommend further workup with CT when appropriate to exclude underlying lesion. Electronically Signed   By: Karen Kays M.D.   On: 07/01/2023 10:42    Procedures Procedures    Medications Ordered in ED Medications  potassium chloride 10 mEq in 100 mL IVPB (10 mEq Intravenous New Bag/Given 07/01/23 1134)  potassium chloride SA (KLOR-CON M) CR tablet 40 mEq (40 mEq Oral Given 07/01/23 1112)  lactated ringers bolus 1,000 mL (1,000 mLs Intravenous New Bag/Given  07/01/23 1132)    ED Course/ Medical Decision Making/ A&P Clinical Course as of 07/01/23 1411  Fri Jul 01, 2023  1304 SARS Coronavirus 2 by RT PCR(!): POSITIVE [BH]    Clinical Course User Index [BH] Namiko Pritts A, PA-C   67 year old here for evaluation of myalgias.  Seen last week for similar in the emergency department.  Noted to have low potassium at that time.  Was given potassium in the emergency department however not discharged on any.  Patient states she continues to have myalgias however the last 24 hours she has developed cough, congestion and rhinorrhea.  She denies any chest pain, shortness of breath, pain or swelling to lower legs.  Denies any medications at home for this.  She is unsure if she is coming contact with anyone with COVID or any viral illness as she has been staying at the [facility].  Her heart and lungs are clear.  Her abdomen is soft, nontender.  Will plan on rechecking labs, specifically the potassium given last week was 2.9.  Will also check COVID, chest x-ray  Labs and imaging personally viewed and interpreted:  CBC  without leukocytosis, hemoglobin 11.5 Metabolic panel shows potassium 2.8 CK 347 Magnesium 2.1 COVID positive Chest x-ray shows still mass overlying rib recommend CT scan CT scan does not show any significant abnormality EKG>>> Patient refused.  Discussed risk versus benefit, patient declined stating she did not want to "stickers on her chest."  Discussed results with patient.  Suspect her muscle cramps are likely multifactorial in nature given her low potassium as well as her COVID illness.  She is afebrile without any tachycardia, tachypnea or hypoxia.  I discussed indirect management at home.  We discussed potassium rich diet and supplementation in the outpatient setting however patient declines potassium prescription.  States "I wont take it."  Kercher to follow-up outpatient, return for new or worsening symptoms.  The patient has been appropriately medically screened and/or stabilized in the ED. I have low suspicion for any other emergent medical condition which would require further screening, evaluation or treatment in the ED or require inpatient management.  Patient is hemodynamically stable and in no acute distress.  Patient able to ambulate in department prior to ED.  Evaluation does not show acute pathology that would require ongoing or additional emergent interventions while in the emergency department or further inpatient treatment.  I have discussed the diagnosis with the patient and answered all questions.  Pain is been managed while in the emergency department and patient has no further complaints prior to discharge.  Patient is comfortable with plan discussed in room and is stable for discharge at this time.  I have discussed strict return precautions for returning to the emergency department.  Patient was encouraged to follow-up with PCP/specialist refer to at discharge.                                 Medical Decision Making Amount and/or Complexity of Data Reviewed External  Data Reviewed: labs, radiology and notes. Labs: ordered. Decision-making details documented in ED Course. Radiology: ordered and independent interpretation performed. Decision-making details documented in ED Course.  Risk OTC drugs. Prescription drug management. Decision regarding hospitalization. Diagnosis or treatment significantly limited by social determinants of health.           Final Clinical Impression(s) / ED Diagnoses Final diagnoses:  COVID  Hypokalemia    Rx / DC Orders  ED Discharge Orders     None         Shatyra Becka A, PA-C 07/01/23 1411    Loetta Rough, MD 07/01/23 1510

## 2023-11-17 ENCOUNTER — Emergency Department (HOSPITAL_COMMUNITY)
Admission: EM | Admit: 2023-11-17 | Discharge: 2023-11-17 | Disposition: A | Discharge status: Home / Self Care | Payer: Medicare Other | Attending: Emergency Medicine | Admitting: Emergency Medicine

## 2023-11-17 ENCOUNTER — Other Ambulatory Visit: Payer: Self-pay

## 2023-11-17 DIAGNOSIS — M25562 Pain in left knee: Secondary | ICD-10-CM | POA: Diagnosis present

## 2023-11-17 DIAGNOSIS — G8929 Other chronic pain: Secondary | ICD-10-CM | POA: Diagnosis not present

## 2023-11-17 DIAGNOSIS — Z5900 Homelessness unspecified: Secondary | ICD-10-CM | POA: Diagnosis not present

## 2023-11-17 DIAGNOSIS — M542 Cervicalgia: Secondary | ICD-10-CM | POA: Diagnosis not present

## 2023-11-17 MED ORDER — DICLOFENAC SODIUM 1 % EX GEL: 4.0000 g | Freq: Four times a day (QID) | CUTANEOUS | 1 refills | Status: AC

## 2023-11-17 NOTE — ED Provider Notes (Signed)
 [place] EMERGENCY DEPARTMENT AT [facility] Provider Note   CSN: [id number] Arrival date & time: 11/17/23  1048     History  Chief Complaint  Patient presents with   Torticollis   Knee Pain    Felicia Collier is a 68 y.o. female.  The history is provided by the patient and medical records. No language interpreter was used.  Knee Pain Associated symptoms: no fever      68 year old female significant history of chronic pain syndrome, homelessness, GERD, neuropathy, schizophrenia presenting with multiple complaints.  Patient states she has had pain to her neck ongoing for the past 6 months and pain to her left knee ongoing for the past month.  She denies any associate trauma.  She has been evaluated for this before.  She has not follow-up with anyone or try any specific treatment.  She denies any fever or chills numbness or weakness.  She is a poor historian.  No report of any chest pain or trouble breathing  Home Medications Prior to Admission medications   Medication Sig Start Date End Date Taking? Authorizing Provider  diclofenac  Sodium (VOLTAREN ) 1 % GEL Apply 4 g topically 4 (four) times daily. Patient not taking: Reported on 05/19/2023 05/17/23   Harris, Abigail, PA-C  hyoscyamine  (LEVSIN AMIEL) 0.125 MG SL tablet Place 1 tablet (0.125 mg total) under the tongue every 6 (six) hours as needed for cramping (abdominal pain). Up to 1.25 mg daily Patient not taking: Reported on 05/19/2023 05/17/23   Harris, Abigail, PA-C      Allergies    Lidocaine     Review of Systems   Review of Systems  Constitutional:  Negative for fever.  Musculoskeletal:  Positive for arthralgias.  Skin:  Negative for wound.    Physical Exam Updated Vital Signs BP (!) 166/74   Pulse 74   Temp 98.9 F (37.2 C) (Oral)   Resp 17   Ht 5' 5 (1.651 m)   Wt 74 kg   SpO2 97%   BMI 27.15 kg/m  Physical Exam Vitals and nursing note reviewed.  Constitutional:      General: She is not in acute  distress.    Appearance: She is well-developed.  HENT:     Head: Atraumatic.  Eyes:     Conjunctiva/sclera: Conjunctivae normal.  Pulmonary:     Effort: Pulmonary effort is normal.  Musculoskeletal:        General: Tenderness (Left knee: Mild tenderness to anterior knee on palpation no edema no erythema or warmth no joint laxity.  Patella is located.) present.     Cervical back: Normal range of motion and neck supple. Tenderness (Mild tenderness to right cervical paraspinal muscle and right trapezius muscle without any overlying skin changes.  Neck with full range of motion.) present.  Skin:    Capillary Refill: Capillary refill takes less than 2 seconds.     Findings: No rash.  Neurological:     Mental Status: She is alert.  Psychiatric:        Mood and Affect: Mood normal.     ED Results / Procedures / Treatments   Labs (all labs ordered are listed, but only abnormal results are displayed) Labs Reviewed - No data to display  EKG None  Radiology No results found.  Procedures Procedures    Medications Ordered in ED Medications - No data to display  ED Course/ Medical Decision Making/ A&P  Medical Decision Making  BP (!) 166/74   Pulse 74   Temp 98.9 F (37.2 C) (Oral)   Resp 17   Ht 5' 5 (1.651 m)   Wt 74 kg   SpO2 97%   BMI 27.15 kg/m   47:68 PM   68 year old female significant history of chronic pain syndrome, homelessness, GERD, neuropathy, schizophrenia presenting with multiple complaints.  Patient states she has had pain to her neck ongoing for the past 6 months and pain to her left knee ongoing for the past month.  She denies any associate trauma.  She has been evaluated for this before.  She has not follow-up with anyone or try any specific treatment.  She denies any fever or chills numbness or weakness.  She is a poor historian.  No report of any chest pain or trouble breathing  On exam, patient is resting comfortably  in bed appears to be in no acute discomfort.  She has some mild right cervical paraspinal muscle and tenderness along the trapezius muscle as well however there is no overlying skin changes and she has full neck range of motion.  She does not have any significant midline cervical spine tenderness no crepitus no step-off.  She has equal strength to bilateral upper extremities with intact radial pulses.  Examination of her left knee remarkable for mild anterior tenderness without any erythema edema or warmth.  Patella is located.  She is able to flex and extend the knee without difficulty.  Patient is able to move all 4 extremities with equal effort.  Suspect her pain is likely chronic in nature.  Have low suspicion for gout, cellulitis, septic joint, and also have low suspicion for fracture or dislocation or contusion.  Suspect arthralgia or other musculoskeletal type of pain.  Will provide Voltaren  gel for comfort.  Knee sleeve for support and outpatient follow-up with orthopedic specialist as needed. Care discussed with Dr. Francesca.        Final Clinical Impression(s) / ED Diagnoses Final diagnoses:  Chronic neck pain  Acute pain of left knee    Rx / DC Orders ED Discharge Orders          Ordered    diclofenac  Sodium (VOLTAREN ) 1 % GEL  4 times daily        11/17/23 1731              Nivia Colon, PA-C 11/17/23 1732    Francesca Elsie CROME, MD 11/17/23 [phone]

## 2023-11-17 NOTE — Discharge Instructions (Signed)
 You have been evaluated for your symptoms.  Please use Voltaren  gel and apply to the affected area twice daily as needed for pain control.  You may wear knee sleeve for support of your knee.  You may follow-up with your primary care doctor or with orthopedist for outpatient care.

## 2023-11-17 NOTE — ED Notes (Signed)
Ortho tech at [facility] 

## 2023-11-17 NOTE — Progress Notes (Signed)
 Orthopedic Tech Progress Note Patient Details:  Felicia Collier Mar 22, 1956 [id number]  Neoprene knee brace applied to LLE.   Ortho Devices Type of Ortho Device: Knee splint Ortho Device/Splint Location: LLE Ortho Device/Splint Interventions: Ordered, Application, Adjustment   Post Interventions Patient Tolerated: Well Instructions Provided: Care of device, Adjustment of device  [name] Ronal Brasil 11/17/2023, 6:10 PM

## 2023-11-17 NOTE — ED Notes (Signed)
 [facility] on the way to see, and apply knee sleeve/brace

## 2023-11-17 NOTE — ED Notes (Signed)
 C/o R lateral neck pain/ stiffness, radiates. Also reports L knee swelling after "feeling a 'pop' at Christmas". 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 L>R. Foot tx previously. Denies sx other than pain. L knee pain 10/10.

## 2023-11-17 NOTE — ED Triage Notes (Signed)
Pt declined tylenol

## 2023-11-17 NOTE — ED Triage Notes (Signed)
 Neck pain x 6 months  and left knee pain x 1 month. Denies any recent injury
